# Patient Record
Sex: Male | Born: 1960
Health system: Southern US, Community
[De-identification: ages and names within clinical notes are randomized; demographics above are authoritative.]

## PROBLEM LIST (undated history)

## (undated) DIAGNOSIS — Z951 Presence of aortocoronary bypass graft: Secondary | ICD-10-CM

## (undated) DIAGNOSIS — Z87442 Personal history of urinary calculi: Secondary | ICD-10-CM

## (undated) DIAGNOSIS — L97519 Non-pressure chronic ulcer of other part of right foot with unspecified severity: Secondary | ICD-10-CM

## (undated) DIAGNOSIS — I25110 Atherosclerotic heart disease of native coronary artery with unstable angina pectoris: Secondary | ICD-10-CM

## (undated) DIAGNOSIS — C4491 Basal cell carcinoma of skin, unspecified: Secondary | ICD-10-CM

## (undated) DIAGNOSIS — IMO0002 Reserved for concepts with insufficient information to code with codable children: Secondary | ICD-10-CM

## (undated) DIAGNOSIS — G4733 Obstructive sleep apnea (adult) (pediatric): Secondary | ICD-10-CM

## (undated) DIAGNOSIS — J189 Pneumonia, unspecified organism: Secondary | ICD-10-CM

## (undated) DIAGNOSIS — K219 Gastro-esophageal reflux disease without esophagitis: Secondary | ICD-10-CM

## (undated) DIAGNOSIS — T7840XA Allergy, unspecified, initial encounter: Secondary | ICD-10-CM

## (undated) DIAGNOSIS — I219 Acute myocardial infarction, unspecified: Secondary | ICD-10-CM

## (undated) DIAGNOSIS — E1165 Type 2 diabetes mellitus with hyperglycemia: Secondary | ICD-10-CM

## (undated) DIAGNOSIS — I10 Essential (primary) hypertension: Secondary | ICD-10-CM

## (undated) DIAGNOSIS — G5792 Unspecified mononeuropathy of left lower limb: Secondary | ICD-10-CM

## (undated) DIAGNOSIS — M14672 Charcot's joint, left ankle and foot: Secondary | ICD-10-CM

## (undated) DIAGNOSIS — D649 Anemia, unspecified: Secondary | ICD-10-CM

## (undated) DIAGNOSIS — G4730 Sleep apnea, unspecified: Secondary | ICD-10-CM

## (undated) DIAGNOSIS — E785 Hyperlipidemia, unspecified: Secondary | ICD-10-CM

## (undated) DIAGNOSIS — M869 Osteomyelitis, unspecified: Secondary | ICD-10-CM

## (undated) DIAGNOSIS — E119 Type 2 diabetes mellitus without complications: Secondary | ICD-10-CM

## (undated) DIAGNOSIS — E118 Type 2 diabetes mellitus with unspecified complications: Secondary | ICD-10-CM

## (undated) HISTORY — PX: COLONOSCOPY W/ POLYPECTOMY: SHX1380

## (undated) HISTORY — DX: Essential (primary) hypertension: I10

## (undated) HISTORY — DX: Basal cell carcinoma of skin, unspecified: C44.91

## (undated) HISTORY — DX: Allergy, unspecified, initial encounter: T78.40XA

## (undated) HISTORY — DX: Osteomyelitis, unspecified: M86.9

## (undated) HISTORY — DX: Charcot's joint, left ankle and foot: M14.672

## (undated) HISTORY — DX: Unspecified mononeuropathy of left lower limb: G57.92

## (undated) HISTORY — DX: Obstructive sleep apnea (adult) (pediatric): G47.33

## (undated) HISTORY — PX: OTHER SURGICAL HISTORY: SHX169

## (undated) HISTORY — DX: Type 2 diabetes mellitus without complications: E11.9

---

## 2010-05-09 ENCOUNTER — Encounter
Admission: RE | Admit: 2010-05-09 | Discharge: 2010-05-09 | Payer: Self-pay | Source: Home / Self Care | Attending: Family Medicine | Admitting: Family Medicine

## 2011-07-04 ENCOUNTER — Ambulatory Visit (HOSPITAL_BASED_OUTPATIENT_CLINIC_OR_DEPARTMENT_OTHER): Payer: 59 | Attending: Otolaryngology | Admitting: Radiology

## 2011-07-04 VITALS — Ht 75.0 in | Wt 255.0 lb

## 2011-07-04 DIAGNOSIS — I4949 Other premature depolarization: Secondary | ICD-10-CM | POA: Insufficient documentation

## 2011-07-04 DIAGNOSIS — R259 Unspecified abnormal involuntary movements: Secondary | ICD-10-CM | POA: Insufficient documentation

## 2011-07-04 DIAGNOSIS — G4733 Obstructive sleep apnea (adult) (pediatric): Secondary | ICD-10-CM | POA: Insufficient documentation

## 2011-07-07 DIAGNOSIS — I4949 Other premature depolarization: Secondary | ICD-10-CM

## 2011-07-07 DIAGNOSIS — G4733 Obstructive sleep apnea (adult) (pediatric): Secondary | ICD-10-CM

## 2011-07-07 DIAGNOSIS — R259 Unspecified abnormal involuntary movements: Secondary | ICD-10-CM

## 2011-07-07 NOTE — Procedures (Signed)
NAMEGERONIMO, Joshua Branch              ACCOUNT NO.:  [id number]  MEDICAL RECORD NO.:  [id number]          PATIENT TYPE:  OUT  LOCATION:  SLEEP CENTER                 FACILITY:  [facility]  PHYSICIAN:  Keosha Rossa D. Maple Hudson, MD, FCCP, FACPDATE OF BIRTH:  04/17/63  DATE OF STUDY:  07/04/2011                           NOCTURNAL POLYSOMNOGRAM  REFERRING PHYSICIAN:  Antony Contras, MD  REFERRING PHYSICIAN:  Excell Seltzer. Jenne Pane, MD  INDICATION FOR STUDY:  Hypersomnia with sleep apnea.  EPWORTH SLEEPINESS SCORE:  5/24.  BMI 31.9, weight 255 pounds, height 75 inches, neck 16 inches.  HOME MEDICATIONS:  Charted and reviewed.  SLEEP ARCHITECTURE:  Total sleep time 340.5 minutes with sleep efficiency 88.9%.  Stage I was 5%, stage II 70.9%, stage III 7.2%, REM 16.9% of total sleep time.  Sleep latency 29.5 minutes, REM latency 117 minutes.  Awake after sleep onset 14 minutes.  Arousal index 35.1.  BEDTIME MEDICATION:  None.  RESPIRATORY DATA:  Apnea/hypopnea index (AHI) 12.2 per hour.  A total of 69 events were scored including 5 obstructive apneas, 1 central apnea, 1 mixed apnea, 62 hypopneas.  Events were most common while supine.  REM AHI 13.6 per hour.  There were insufficient numbers of early events to trigger application of split protocol, CPAP titration on this study night.  OXYGEN DATA:  Moderate snoring with oxygen desaturation to a nadir of 84% and a mean oxygen saturation through the study of 92% on room air.  CARDIAC DATA:  Sinus rhythm with occasional PVC.  MOVEMENT/PARASOMNIA:  Periodic limb movement syndrome.  Total of 92 limb jerks were counted, of which 2 were associated with arousals or awakening for periodic limb movement with arousal index of 0.4 per hour. Bathroom x1.  IMPRESSION/RECOMMENDATION: 1. Mild obstructive sleep apnea/hypoxemia syndrome, AHI 12.2 per hour,     mainly associated with supine sleep position and REM.  REM AHI 13.6     per hour.  Moderate snoring with  oxygen desaturation to a nadir of     84% and a mean oxygen saturation through the study of 92% on room     air. 2. There were insufficient numbers of events to qualify for split     protocol CPAP titration on this study night.  Consider return for     dedicated CPAP titration study or evaluate for alternative     management, as clinically appropriate. 3. Frequent limb jerks but with little actual sleep disturbance.  A     total of 92 limb jerks were counted, but only 2 were associated     with arousals or awakening for an index of 0.4 per hour.  Bathroom     x1.     Evadene Wardrip D. Maple Hudson, MD, [facility], FACP Diplomate, Biomedical engineer of Sleep Medicine Electronically Signed    CDY/MEDQ  D:  07/07/2011 10:54:41  T:  07/07/2011 13:08:17  Job:  [id number]

## 2014-05-07 ENCOUNTER — Encounter: Payer: Self-pay | Admitting: Medical

## 2014-05-07 ENCOUNTER — Other Ambulatory Visit: Payer: Self-pay

## 2014-05-07 ENCOUNTER — Ambulatory Visit (INDEPENDENT_AMBULATORY_CARE_PROVIDER_SITE_OTHER): Payer: 59 | Admitting: Medical

## 2014-05-07 VITALS — BP 158/95 | HR 91 | Temp 98.2°F | Ht 75.2 in | Wt 267.2 lb

## 2014-05-07 DIAGNOSIS — G4733 Obstructive sleep apnea (adult) (pediatric): Secondary | ICD-10-CM | POA: Insufficient documentation

## 2014-05-07 DIAGNOSIS — I10 Essential (primary) hypertension: Secondary | ICD-10-CM

## 2014-05-07 MED ORDER — LISINOPRIL 10 MG PO TABS: 10.0000 mg | ORAL_TABLET | Freq: Every day | ORAL | Status: DC

## 2014-05-07 NOTE — Assessment & Plan Note (Signed)
Mild by review of his records and further testing was planned. Pt never followed through. He does not want referral now. He states he wants to try to loose weight first. Will follow his weight loss efforts and bring referral back up in future.

## 2014-05-07 NOTE — Progress Notes (Signed)
Pre visit review using our clinic review tool, if applicable. No additional management support is needed unless otherwise documented below in the visit note. 

## 2014-05-07 NOTE — Assessment & Plan Note (Signed)
Pt bp still high despite hctz 25 mg. Advised eat foods with k. Will add lisinopril since his bp not ideal. Bmp ok at ED. Follow up in 3 wks check his bp and repeat cmp. Make sure his k is still in normal limits.

## 2014-05-07 NOTE — Progress Notes (Signed)
Subjective:    Patient ID: Joshua Branch, male    DOB: Jul 01, 1961, 53 y.o.   MRN: [id number]  HPI    Pt states he was driving to a football game a game and got confused. He got lost and had a HA. No vision changes. No nauseu or vomiting. No slurred speech. Pt bp was 180-190 over 120-130. Pt had ct of his head and was negative. Pt was given medication. Pt was given HCTZ 25 mg q day. Pt states bp was elevated before about 5 years ago. Prior reading were approximate 145/95 but he never took medications. Pt bp readings over past week on average 140/90 with his machine. Highest was 145/95.    Pt states a lot of stess recently. Mom is in alzeihmers unit. Arguments with relatives. Some marital problems last 5 months. Not depressed or real anxious.  Pt states feels ok. No chest pain or neurologic symptoms.  Spring allergies none now.  Allergic to fruits with pit or core.  Pt drinks 3 beers 1-2 times a week with friends.  Reviewed his pmh, psh, and fh.  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 Malachi Bonds a day. Now very rare. Tennis one time a week. Married- 2 boys.  Pt states sleep study for apnea was neg 2 years ago. Pt wife thinks. On review pt informed mild sleep studies in past and further studies recommended.  Past Medical History  Diagnosis Date  . Allergy   . Hypertension   . Obstructive sleep apnea 05/07/2014    History   Social History  . Marital Status: Married    Spouse Name: N/A    Number of Children: N/A  . Years of Education: N/A   Occupational History  . Not on file.   Social History Main Topics  . Smoking status: Never Smoker   . Smokeless tobacco: Never Used  . Alcohol Use: Yes  . Drug Use: Not on file  . Sexual Activity: Not on file   Other Topics Concern  . Not on file   Social History Narrative  . No narrative on file    No past surgical history on file.  Family History  Problem Relation Age of Onset  . Diabetes Mother   . Stroke  Father   . Hypertension Father   . Hearing loss Father     No Known Allergies  No current outpatient prescriptions on file prior to visit.   No current facility-administered medications on file prior to visit.    BP 158/95  Pulse 91  Temp(Src) 98.2 F (36.8 C) (Oral)  Ht 6' 3.2" (1.91 m)  Wt 267 lb 3.2 oz (121.201 kg)  BMI 33.22 kg/m2  SpO2 94%           Review of Systems  Constitutional: Negative for fever, chills and diaphoresis.  HENT: Negative for congestion, ear pain, hearing loss, mouth sores, nosebleeds, rhinorrhea, sinus pressure and sore throat.   Respiratory: Negative for apnea, cough, choking, shortness of breath and wheezing.   Cardiovascular: Negative for chest pain, palpitations and leg swelling.  Gastrointestinal: Negative for nausea, vomiting, abdominal pain, constipation, blood in stool, abdominal distention and rectal pain.  Endocrine: Negative for polydipsia, polyphagia and polyuria.  Genitourinary: Negative.   Musculoskeletal: Negative.   Neurological: Negative for dizziness, tremors, seizures, syncope, facial asymmetry, speech difficulty, weakness, light-headedness, numbness and headaches.  Hematological: Negative for adenopathy. Does not bruise/bleed easily.  Psychiatric/Behavioral:       None of  below reported but stressed and marital problems.       Objective:   Physical Exam  General Mental Status- Alert. General Appearance- Not in acute distress.   Skin General: Color- Normal Color. Moisture- Normal Moisture.  Neck Carotid Arteries- Normal color. Moisture- Normal Moisture. No carotid bruits. No JVD.  Chest and Lung Exam Auscultation: Breath Sounds:-Normal.clear even and unlabored.  Cardiovascular Auscultation:Rythm- Regular, rate and rhythm. Murmurs & Other Heart Sounds:Auscultation of the heart reveals- No Murmurs.  Abdomen Inspection:-Inspeection Normal. Palpation/Percussion:Note:No mass. Palpation and Percussion of the  abdomen reveal- Non Tender, Non Distended + BS, no rebound or guarding.    Neurologic Cranial Nerve exam:- CN III-XII intact(No nystagmus), symmetric smile. Drift Test:- No drift. Finger to Nose:- Normal/Intact Strength:- 5/5 equal and symmetric strength both upper and lower extremities.       Assessment & Plan:

## 2014-05-07 NOTE — Patient Instructions (Addendum)
For your htn, I want to add lisinopril 10 mg every day. Continue with hctz 25 mg every day. Check cmp today and will repeat cmp after 3 weeks to make sure your k is within normal limits.   Recommend dash diet, and mild-moderate exercise.(Start slow first and get in shape gradual)  Try to loose 10-15 pounds. You declined further sleep study now but would recommend that near future.  Follow up in 3 weeks or prn.  By  January I want you to schedule complete physical exam. Note today lab dc'd since he did provide copy of bmp done in ED and k was normal.  DASH Eating Plan DASH stands for "Dietary Approaches to Stop Hypertension." The DASH eating plan is a healthy eating plan that has been shown to reduce high blood pressure (hypertension). Additional health benefits may include reducing the risk of type 2 diabetes mellitus, heart disease, and stroke. The DASH eating plan may also help with weight loss. WHAT DO I NEED TO KNOW ABOUT THE DASH EATING PLAN? For the DASH eating plan, you will follow these general guidelines:  Choose foods with a percent daily value for sodium of less than 5% (as listed on the food label).  Use salt-free seasonings or herbs instead of table salt or sea salt.  Check with your health care provider or pharmacist before using salt substitutes.  Eat lower-sodium products, often labeled as "lower sodium" or "no salt added."  Eat fresh foods.  Eat more vegetables, fruits, and low-fat dairy products.  Choose whole grains. Look for the word "whole" as the first word in the ingredient list.  Choose fish and skinless chicken or [place] more often than red meat. Limit fish, poultry, and meat to 6 oz (170 g) each day.  Limit sweets, desserts, sugars, and sugary drinks.  Choose heart-healthy fats.  Limit cheese to 1 oz (28 g) per day.  Eat more home-cooked food and less restaurant, buffet, and fast food.  Limit fried foods.  Cook foods using methods other than  frying.  Limit canned vegetables. If you do use them, rinse them well to decrease the sodium.  When eating at a restaurant, ask that your food be prepared with less salt, or no salt if possible. WHAT FOODS CAN I EAT? Seek help from a dietitian for individual calorie needs. Grains Whole grain or whole wheat bread. Brown rice. Whole grain or whole wheat pasta. Quinoa, bulgur, and whole grain cereals. Low-sodium cereals. Corn or whole wheat flour tortillas. Whole grain cornbread. Whole grain crackers. Low-sodium crackers. Vegetables Fresh or frozen vegetables (raw, steamed, roasted, or grilled). Low-sodium or reduced-sodium tomato and vegetable juices. Low-sodium or reduced-sodium tomato sauce and paste. Low-sodium or reduced-sodium canned vegetables.  Fruits All fresh, canned (in natural juice), or frozen fruits. Meat and Other Protein Products Ground beef (85% or leaner), grass-fed beef, or beef trimmed of fat. Skinless chicken or [place]. Ground chicken or [place]. Pork trimmed of fat. All fish and seafood. Eggs. Dried beans, peas, or lentils. Unsalted nuts and seeds. Unsalted canned beans. Dairy Low-fat dairy products, such as skim or 1% milk, 2% or reduced-fat cheeses, low-fat ricotta or cottage cheese, or plain low-fat yogurt. Low-sodium or reduced-sodium cheeses. Fats and Oils Tub margarines without trans fats. Light or reduced-fat mayonnaise and salad dressings (reduced sodium). Avocado. Safflower, olive, or canola oils. Natural peanut or almond butter. Other Unsalted popcorn and pretzels. The items listed above may not be a complete list of recommended foods or beverages. Contact your  dietitian for more options. WHAT FOODS ARE NOT RECOMMENDED? Grains White bread. White pasta. White rice. Refined cornbread. Bagels and croissants. Crackers that contain trans fat. Vegetables Creamed or fried vegetables. Vegetables in a cheese sauce. Regular canned vegetables. Regular canned tomato sauce  and paste. Regular tomato and vegetable juices. Fruits Dried fruits. Canned fruit in light or heavy syrup. Fruit juice. Meat and Other Protein Products Fatty cuts of meat. Ribs, chicken wings, bacon, sausage, bologna, salami, chitterlings, fatback, hot dogs, bratwurst, and packaged luncheon meats. Salted nuts and seeds. Canned beans with salt. Dairy Whole or 2% milk, cream, half-and-half, and cream cheese. Whole-fat or sweetened yogurt. Full-fat cheeses or blue cheese. Nondairy creamers and whipped toppings. Processed cheese, cheese spreads, or cheese curds. Condiments Onion and garlic salt, seasoned salt, table salt, and sea salt. Canned and packaged gravies. Worcestershire sauce. Tartar sauce. Barbecue sauce. Teriyaki sauce. Soy sauce, including reduced sodium. Steak sauce. Fish sauce. Oyster sauce. Cocktail sauce. Horseradish. Ketchup and mustard. Meat flavorings and tenderizers. Bouillon cubes. Hot sauce. Tabasco sauce. Marinades. Taco seasonings. Relishes. Fats and Oils Butter, stick margarine, lard, shortening, ghee, and bacon fat. Coconut, palm kernel, or palm oils. Regular salad dressings. Other Pickles and olives. Salted popcorn and pretzels. The items listed above may not be a complete list of foods and beverages to avoid. Contact your dietitian for more information. WHERE CAN I FIND MORE INFORMATION? National Heart, Lung, and Blood Institute: travelstabloid.com Document Released: 07/05/2011 Document Revised: 11/30/2013 Document Reviewed: 05/20/2013 [facility] Patient Information 2015 [place], [place]. This information is not intended to replace advice given to you by your health care provider. Make sure you discuss any questions you have with your health care provider.

## 2014-05-28 ENCOUNTER — Ambulatory Visit: Payer: 59 | Admitting: Medical

## 2014-05-31 ENCOUNTER — Other Ambulatory Visit: Payer: Self-pay

## 2014-05-31 ENCOUNTER — Telehealth: Payer: Self-pay | Admitting: Medical

## 2014-05-31 DIAGNOSIS — I10 Essential (primary) hypertension: Secondary | ICD-10-CM

## 2014-05-31 MED ORDER — LISINOPRIL 10 MG PO TABS: 10.0000 mg | ORAL_TABLET | Freq: Every day | ORAL | Status: DC

## 2014-05-31 NOTE — Telephone Encounter (Signed)
Patient needed to r/s due to Joshua Branch being out of the office 11/3, he took his last lisinopril today and wants a call back from assistant to see if he needs a refill or if it is ok to wait until Wednesday to continue taking

## 2014-05-31 NOTE — Telephone Encounter (Signed)
Refilled medication for patient. Patient notified.

## 2014-06-01 ENCOUNTER — Ambulatory Visit: Payer: 59 | Admitting: Medical

## 2014-06-02 ENCOUNTER — Telehealth: Payer: Self-pay | Admitting: Medical

## 2014-06-02 ENCOUNTER — Ambulatory Visit (INDEPENDENT_AMBULATORY_CARE_PROVIDER_SITE_OTHER): Payer: 59 | Admitting: Medical

## 2014-06-02 ENCOUNTER — Encounter: Payer: Self-pay | Admitting: Medical

## 2014-06-02 VITALS — BP 143/93 | HR 90 | Temp 98.4°F | Ht 75.2 in | Wt 273.5 lb

## 2014-06-02 DIAGNOSIS — H6990 Unspecified Eustachian tube disorder, unspecified ear: Secondary | ICD-10-CM | POA: Insufficient documentation

## 2014-06-02 DIAGNOSIS — R252 Cramp and spasm: Secondary | ICD-10-CM | POA: Insufficient documentation

## 2014-06-02 DIAGNOSIS — H6980 Other specified disorders of Eustachian tube, unspecified ear: Secondary | ICD-10-CM | POA: Insufficient documentation

## 2014-06-02 DIAGNOSIS — H6982 Other specified disorders of Eustachian tube, left ear: Secondary | ICD-10-CM

## 2014-06-02 DIAGNOSIS — I10 Essential (primary) hypertension: Secondary | ICD-10-CM

## 2014-06-02 DIAGNOSIS — R739 Hyperglycemia, unspecified: Secondary | ICD-10-CM

## 2014-06-02 LAB — COMPREHENSIVE METABOLIC PANEL
ALBUMIN: 3.4 g/dL — AB (ref 3.5–5.2)
ALK PHOS: 67 U/L (ref 39–117)
ALT: 27 U/L (ref 0–53)
AST: 16 U/L (ref 0–37)
BUN: 27 mg/dL — AB (ref 6–23)
CO2: 25 meq/L (ref 19–32)
Calcium: 9.1 mg/dL (ref 8.4–10.5)
Chloride: 105 mEq/L (ref 96–112)
Creatinine, Ser: 1.1 mg/dL (ref 0.4–1.5)
GFR: 75.55 mL/min (ref >60.00)
GLUCOSE: 198 mg/dL — AB (ref 70–99)
POTASSIUM: 4.1 meq/L (ref 3.5–5.1)
SODIUM: 137 meq/L (ref 135–145)
TOTAL PROTEIN: 6.9 g/dL (ref 6.0–8.3)
Total Bilirubin: 0.6 mg/dL (ref 0.2–1.2)

## 2014-06-02 LAB — MAGNESIUM: Magnesium: 2 mg/dL (ref 1.5–2.5)

## 2014-06-02 MED ORDER — LISINOPRIL-HYDROCHLOROTHIAZIDE 10-12.5 MG PO TABS: 1.0000 | ORAL_TABLET | Freq: Every day | ORAL | Status: DC

## 2014-06-02 NOTE — Patient Instructions (Signed)
For your bp which is most of the time very well controlled will rx lisinopril/hctz 10/12.5 mg. We will check your electrolyte levels due to mild cramping.  For your probable eustachian tube dysfunction which likely related to allergies, I want you to continue to use nasocort and zyrtec. When we know your bp is under good control(in 3-4 days) with the above med  then you could use very low dose otc sudafed but only 2 days. Check your bp to monitor effect of sudafed on your bp levels.  Follow up in 1 month for fasting wellness exam early am.

## 2014-06-02 NOTE — Progress Notes (Signed)
Pre visit review using our clinic review tool, if applicable. No additional management support is needed unless otherwise documented below in the visit note. 

## 2014-06-02 NOTE — Assessment & Plan Note (Signed)
Check cmp today and mg level. Make sure electrolytes are in line. See if may need k supplementation.

## 2014-06-02 NOTE — Assessment & Plan Note (Signed)
Pt blood pressures are mostly controlled with systolic in 943 range and diastolic controlled as well. So I think will try to give combo 10/12.5 Lisinopril/hctz. This may help with his cramping of hands. Will Check his k today. Note previosly was on hctz 25 mg.

## 2014-06-02 NOTE — Assessment & Plan Note (Signed)
For your probable eustachian tube dysfunction which likely related to allergies, I want you to continue to use nasocort and zyrtec. When we know your bp is under good control(in 3-4 days) with the above bp med  then you could use very low dose otc sudafed but only 2 days. Check your bp to monitor effect of sudafed on your bp levels.

## 2014-06-02 NOTE — Progress Notes (Signed)
Subjective:    Patient ID: Joshua Branch, male    DOB: [birth year], 53 y.o.   MRN: [id number]  HPI   Pt in for follow up. Pt states his bp range 140/90 in the am when he first wakes. But most 115/75 range. Little higher at time  but not lower. Pt feels fine. No side effects that are obvious. Pt only states he cramps after trimming a lot of branches but he states that he has done that in the past.   Also after playing tennis he noticed a little bit of cramping but he states he has done that in the past. No chest pain or neuro signs or symptoms.  States hx of intermittent lt ear pressure. This has been going on for years. In the spring time it usually severe. Pt states nasocort helps a little. Spring is worse. He states has seen various ENT in the past but none found solution.He has tried afrin before but did not help.  . Past Medical History  Diagnosis Date  . Allergy   . Hypertension   . Obstructive sleep apnea 05/07/2014    History   Social History  . Marital Status: Married    Spouse Name: N/A    Number of Children: N/A  . Years of Education: N/A   Occupational History  . Not on file.   Social History Main Topics  . Smoking status: Never Smoker   . Smokeless tobacco: Never Used  . Alcohol Use: Yes  . Drug Use: Not on file  . Sexual Activity: Not on file   Other Topics Concern  . Not on file   Social History Narrative    No past surgical history on file.  Family History  Problem Relation Age of Onset  . Diabetes Mother   . Stroke Father   . Hypertension Father   . Hearing loss Father     No Known Allergies  Current Outpatient Prescriptions on File Prior to Visit  Medication Sig Dispense Refill  . hydrochlorothiazide (HYDRODIURIL) 25 MG tablet Take 25 mg by mouth daily.    Marland Kitchen lisinopril (PRINIVIL,ZESTRIL) 10 MG tablet Take 1 tablet (10 mg total) by mouth daily. 30 tablet 1   No current facility-administered medications on file prior to visit.    BP  143/93 mmHg  Pulse 90  Temp(Src) 98.4 F (36.9 C) (Oral)  Ht 6' 3.2" (1.91 m)  Wt 273 lb 8 oz (124.059 kg)  BMI 34.01 kg/m2  SpO2 95%      Review of Systems  Constitutional: Negative for fever, chills and fatigue.  HENT: Positive for ear pain. Negative for congestion, rhinorrhea, sinus pressure, sore throat and trouble swallowing.        Lt ear slight pressure.  Respiratory: Negative for cough, shortness of breath and wheezing.   Cardiovascular: Negative for chest pain and palpitations.  Gastrointestinal: Negative.   Genitourinary: Negative.   Musculoskeletal: Negative.   Skin: Negative.   Hematological: Negative.  Negative for adenopathy. Does not bruise/bleed easily.  Psychiatric/Behavioral: Negative.           Objective:   Physical Exam   General Mental Status- Alert. General Appearance- Not in acute distress.   HEENT-  No sinus pressure. Mild boggy turbinates. No pnd. Canals clear and tm both normal.  Skin General: Color- Normal Color. Moisture- Normal Moisture.  Neck Carotid Arteries- Normal color. Moisture- Normal Moisture. No carotid bruits. No JVD.  Chest and Lung Exam Auscultation: Breath Sounds:- CTA  Cardiovascular Auscultation:Rythm-  Regular., Rate and Rhythm Murmurs & Other Heart Sounds:Auscultation of the heart reveals- No Murmurs.    Neurologic Cranial Nerve exam:- CN III-XII intact(No nystagmus), symmetric smile. Romberg Exam:- Negative.            Assessment & Plan:

## 2014-06-02 NOTE — Telephone Encounter (Signed)
Elevated bs on cmp. 

## 2014-06-02 NOTE — Telephone Encounter (Signed)
Originally opened up and started documenting on Mr.Ted chart. This was wrong pt chart. So that note and wrote this explanation.

## 2014-07-07 ENCOUNTER — Telehealth: Payer: Self-pay | Admitting: Medical

## 2014-07-07 NOTE — Telephone Encounter (Signed)
Pt message states bp medication is clogging up his ears. Unusual side effect. I thought he had eustachian tube dysfunction. He need office visit. Needs re-evaluation and may nee ENT referral.

## 2014-07-07 NOTE — Telephone Encounter (Signed)
Caller name: Daviel Relation to pt: self Call back number: [phone] Pharmacy:  Reason for call:   Patient states that the bp medication is still clogging up his ears and would like something else.

## 2014-07-08 NOTE — Telephone Encounter (Signed)
Discussed situation with Percell Miller, he stated that patient still needs to come in for OV.  Pt made aware.  He said he would call back to schedule an appointment.

## 2014-07-08 NOTE — Telephone Encounter (Signed)
Pt states that he's certain the side effect is due to the lisinopril-HCTZ.  States when on the medication his ears clog up and whenever he's off the medication the clogging of the ears stop.  Decongestions and antihistamines due help.  At this time, the patient would rather not come in for an office visit.  Instead, he would like his blood pressure medication changed to something else.    Please advise.

## 2014-07-21 ENCOUNTER — Ambulatory Visit (INDEPENDENT_AMBULATORY_CARE_PROVIDER_SITE_OTHER): Payer: 59 | Admitting: Medical

## 2014-07-21 ENCOUNTER — Encounter: Payer: Self-pay | Admitting: Medical

## 2014-07-21 VITALS — BP 128/80 | HR 90 | Temp 97.8°F | Ht 75.2 in | Wt 276.4 lb

## 2014-07-21 DIAGNOSIS — H6992 Unspecified Eustachian tube disorder, left ear: Secondary | ICD-10-CM

## 2014-07-21 DIAGNOSIS — H6982 Other specified disorders of Eustachian tube, left ear: Secondary | ICD-10-CM

## 2014-07-21 DIAGNOSIS — I10 Essential (primary) hypertension: Secondary | ICD-10-CM

## 2014-07-21 DIAGNOSIS — R739 Hyperglycemia, unspecified: Secondary | ICD-10-CM

## 2014-07-21 MED ORDER — AMLODIPINE BESYLATE 10 MG PO TABS: 10.0000 mg | ORAL_TABLET | Freq: Every day | ORAL | Status: DC

## 2014-07-21 NOTE — Assessment & Plan Note (Addendum)
Your blood pressure is well controlled today but you report definite correlation with you lt  ear pressure recently with use of lisinopril/hctz.  You stopped and your ear symptoms resolved. So I am switching you to amlodipine.  This is unusual but pt does want me to switch the bp med.  Please call us in 2 wks and give [place] bp readings

## 2014-07-21 NOTE — Progress Notes (Signed)
Pre visit review using our clinic review tool, if applicable. No additional management support is needed unless otherwise documented below in the visit note. 

## 2014-07-21 NOTE — Patient Instructions (Addendum)
Your blood pressure is well controlled today but you report definite correlation with you lt  ear pressure recently with use of lisinopril/hctz.  You stopped and your ear symptoms resolved. So I am switching you to amlodipine.  Please call us in 2 wks and give [place] bp readings and update [place] on your ear pressure. If ear pressure persists would either consider ent referral or imaging studies.  Combination of afrin only 2 days and nasal steroid thereafter may help.  Follow up 2 wks or as needed.  I did review your labs today and your bs was elevated so I do want you to get a1-c today.

## 2014-07-21 NOTE — Progress Notes (Signed)
Subjective:    Patient ID: Joshua Branch, male    DOB: 1961-03-20, 53 y.o.   MRN: [id number]  HPI   Pt in states he has plugged up ear sensation. This is chronic and recurrent lt ear for years. Pt has seen various ENT over the years but never really helped much.  His symptoms always in the spring.   Pt never gets this plugged sensation in the fall. And so he attributed it to the lisinopril. He stopped the lisinopril for 3 days and he noticed the plugged sensation went away.   In the past when gets ear pressure during the spring he will use decongestant and zyrtec. And symptoms would resolve.  Pt not a smoker.  Pt bp reading readings on lisinopril and he states late in the day is his best reading. First reading in the am is usually the worse. But most of the day he states his bp are controlled.  Pt baseline had decreased hearing in his left ear  when he was young  Out of all pt specialist that he saw he never had any imaging studies that he remembers.   Past Medical History  Diagnosis Date  . Allergy   . Hypertension   . Obstructive sleep apnea 05/07/2014    History   Social History  . Marital Status: Married    Spouse Name: N/A    Number of Children: N/A  . Years of Education: N/A   Occupational History  . Not on file.   Social History Main Topics  . Smoking status: Never Smoker   . Smokeless tobacco: Never Used  . Alcohol Use: Yes  . Drug Use: Not on file  . Sexual Activity: Not on file   Other Topics Concern  . Not on file   Social History Narrative    No past surgical history on file.  Family History  Problem Relation Age of Onset  . Diabetes Mother   . Stroke Father   . Hypertension Father   . Hearing loss Father     No Known Allergies  Current Outpatient Prescriptions on File Prior to Visit  Medication Sig Dispense Refill  . lisinopril-hydrochlorothiazide (PRINZIDE,ZESTORETIC) 10-12.5 MG per tablet Take 1 tablet by mouth daily. 30 tablet 3   No  current facility-administered medications on file prior to visit.    BP 128/80 mmHg  Pulse 90  Temp(Src) 97.8 F (36.6 C) (Oral)  Ht 6' 3.2" (1.91 m)  Wt 276 lb 6.4 oz (125.374 kg)  BMI 34.37 kg/m2  SpO2 94%       Review of Systems  Constitutional: Negative for fever, chills and fatigue.  HENT: Negative.        Lt ear pressure which is chronic and recurrent.  Respiratory: Negative for cough, choking, chest tightness, shortness of breath and wheezing.   Cardiovascular: Negative for chest pain and palpitations.  Neurological: Negative for dizziness, weakness, light-headedness and headaches.  Hematological: Negative for adenopathy.  Psychiatric/Behavioral: Negative for confusion, dysphoric mood and agitation. The patient is not nervous/anxious.        Objective:   Physical Exam   General Mental Status- Alert. General Appearance- Not in acute distress.   Skin General: Color- Normal Color. Moisture- Normal Moisture.  Neck Carotid Arteries- Normal color. Moisture- Normal Moisture.Marland Kitchen No JVD.  Chest and Lung Exam Auscultation: Breath Sounds:-Normal. CTA.  Cardiovascular Auscultation:Rythm- Regular, Rate and rhythm. Murmurs & Other Heart Sounds:Auscultation of the heart reveals- No Murmurs.    Neurologic Cranial Nerve exam:-  CN III-XII intact(No nystagmus), symmetric smile. Negative romberg.   HEENT Head- Normal. Ear Auditory Canal - Left- Normal. Right - Normal.Tympanic Membrane- Left- Normal minimal faint(tiny) central pinkish/red streak.. Right- Normal. Eye Sclera/Conjunctiva- Left- Normal. Right- Normal. Nose & Sinuses Nasal Mucosa- Left-  Not boggy or Congested. Right-  Not  boggy or Congested.  Bilateral- No enlargement. Discharge- bilateral-None.             Assessment & Plan:

## 2014-07-21 NOTE — Assessment & Plan Note (Signed)
Combination of afrin only 2 days and nasal steroid thereafter may help.   If ear pressure persists would either consider ent referral or imaging studies.  Pt expresses major resistance to referral back to ENT. Counseled with him on imaging studies but he expressed did not want to do that either.

## 2014-07-21 NOTE — Assessment & Plan Note (Signed)
I wanted pt to get a1-c today. He states he did not want that done today. I explained the importance of getting to determine if he has diabetes. He still declines.

## 2014-10-05 ENCOUNTER — Telehealth: Payer: Self-pay | Admitting: Medical

## 2014-10-05 NOTE — Telephone Encounter (Signed)
Caller name: Antwane, Grose Relation to pt: self  Call back number: [phone]   Reason for call:  Pt requesting an alternate of lisinopril-hydrochlorothiazide (PRINZIDE,ZESTORETIC) 10-12.5 MG per table. Please advise

## 2014-10-05 NOTE — Telephone Encounter (Signed)
Per pt ENT thinks he should stop lisinopril causing ear symptoms. I actually switched him to amlodipine per avs at his request. So I get LPN to notify pt regarding last plan per avs and notify him.

## 2014-10-06 MED ORDER — HYDROCHLOROTHIAZIDE 25 MG PO TABS: 25.0000 mg | ORAL_TABLET | Freq: Every day | ORAL | Status: DC

## 2014-10-06 NOTE — Telephone Encounter (Signed)
Pt states his ear felt worse with amlodipine and it gave him a ha. Message I got was ENT thought lisinopril was causing him ear symptoms. Rx hctz 25 mg at his request. I advised him make sure eating k rich foods while on. Will follow up in 2 wks and check his bp.

## 2015-01-13 ENCOUNTER — Other Ambulatory Visit: Payer: Self-pay | Admitting: Medical

## 2015-01-20 NOTE — Telephone Encounter (Signed)
Patient states he will check his schedule and call back to make appointment for follow up visit.

## 2015-02-03 ENCOUNTER — Ambulatory Visit (INDEPENDENT_AMBULATORY_CARE_PROVIDER_SITE_OTHER): Payer: 59 | Admitting: Sports Medicine

## 2015-02-03 ENCOUNTER — Encounter: Payer: Self-pay | Admitting: Sports Medicine

## 2015-02-03 VITALS — BP 146/96 | HR 89 | Ht 74.0 in | Wt 270.0 lb

## 2015-02-03 DIAGNOSIS — M14672 Charcot's joint, left ankle and foot: Secondary | ICD-10-CM

## 2015-02-03 DIAGNOSIS — M2170 Unequal limb length (acquired), unspecified site: Secondary | ICD-10-CM | POA: Diagnosis not present

## 2015-02-03 DIAGNOSIS — G5792 Unspecified mononeuropathy of left lower limb: Secondary | ICD-10-CM | POA: Diagnosis not present

## 2015-02-03 HISTORY — DX: Unspecified mononeuropathy of left lower limb: G57.92

## 2015-02-03 HISTORY — DX: Charcot's joint, left ankle and foot: M14.672

## 2015-02-03 NOTE — Assessment & Plan Note (Signed)
Etiology unclear to me but suspect lumbar origin

## 2015-02-03 NOTE — Progress Notes (Signed)
  Joshua Branch - 54 y.o. male MRN [id number]  Date of birth: 25-Aug-1960  SUBJECTIVE:  Including CC & ROS.   Patient presents for evaluation of left foot swelling. He says that the swelling started about 1 year ago, and has gotten to the point where it will never go away completely. He is active and plays tennis, where the swelling tends to get worse. He denies any pain to the area, but also says that he has a 20+ year history of numbness in the foot (he is uncertain why, but has had nerve conduction testing that showed the nerves were damaged... He has a history of low back trauma/pain but doesn't state much in the way of long radiculopathy history). He has only tried using a bandage to wrap the foot. He was evaluated by his orthopedist Dr. Berenice Primas and had x-rays but did not bring in a disc today. He works in an office as a Chief Executive Officer.  Dr Berenice Primas felt he should seek conservative care and support for what sounds like midfoot arthritis.   HISTORY: Past Medical, Surgical, Social, and Family History Reviewed & Updated per EMR. Pertinent Historical Findings include: Neuropathy of left foot Prediabetes  PHYSICAL EXAM:  VS: BP:(!) 146/96 mmHg  HR:89bpm  TEMP: ( )  RESP:   HT:6\' 2"  (188 cm)   WT:270 lb (122.471 kg)  BMI:34.7 PHYSICAL EXAM:  General: NAD Neuro: alert and oriented x 3. MSK: Left foot: INSPECTION: narrow left (and right) foot for stature. obvious deformity/swelling noted in medial left midfoot in area of lisfranc joint. Short 1st MT compared to 2nd.  PALPATION: palpable bony anatomy (1st metatarsal) on bottom of foot not aligned with lisfranc joint/ large MTT bossint at MTT 1,2  And less on MTT 3 RANGE OF MOTION: no limitation in motion of toes. Slightly limited ankle dorsiflexion. STRENGTH: intact SPECIAL TESTS: Upon standing there is complete loss of longitudinal arch NEUROVASCULAR STATUS: diminished sensation. 2+ DP pulse  Leg length discrepancy noted (not measured) right  shorter than left  Ultrasound: Marked arthritic breakdown of MTT 1/2 and 3 with effusion and calcifications;  Calcification and loose body along medial malleolus.  Effusions along medial MTT joints.  No effusion at ankle.  Midfoot spurring noted.  ASSESSMENT & PLAN: See problem based charting & AVS for pt instructions.

## 2015-02-03 NOTE — Assessment & Plan Note (Addendum)
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 At this point primary goal is to limit further worsening of the joint changes. Sport insole with long arch support pad today. Schedule back for custom orthotics. Continue his compression sleeve for midfoot, ankle brace (air cast). It would be nice to be able to view the x-rays taken of his foot so encouraged him to get a copy of the disc for next visit.  Addendum  We added sports insoles and continued the leg length correction  For better support of mid foot scaphoid padding added  Comfortable after preparation/  Test these prior to custom orthotic

## 2015-02-03 NOTE — Assessment & Plan Note (Signed)
We will add a lift to the RT orthotic on preparation

## 2015-02-24 ENCOUNTER — Encounter: Payer: Self-pay | Admitting: Medical

## 2015-02-24 ENCOUNTER — Other Ambulatory Visit: Payer: Self-pay

## 2015-02-24 ENCOUNTER — Ambulatory Visit (INDEPENDENT_AMBULATORY_CARE_PROVIDER_SITE_OTHER): Payer: 59 | Admitting: Medical

## 2015-02-24 VITALS — BP 154/92 | HR 91 | Temp 98.5°F | Ht 74.0 in | Wt 282.6 lb

## 2015-02-24 DIAGNOSIS — R739 Hyperglycemia, unspecified: Secondary | ICD-10-CM | POA: Diagnosis not present

## 2015-02-24 DIAGNOSIS — I10 Essential (primary) hypertension: Secondary | ICD-10-CM | POA: Diagnosis not present

## 2015-02-24 DIAGNOSIS — H60392 Other infective otitis externa, left ear: Secondary | ICD-10-CM

## 2015-02-24 DIAGNOSIS — Z1211 Encounter for screening for malignant neoplasm of colon: Secondary | ICD-10-CM | POA: Diagnosis not present

## 2015-02-24 MED ORDER — LISINOPRIL-HYDROCHLOROTHIAZIDE 10-12.5 MG PO TABS: 1.0000 | ORAL_TABLET | Freq: Every day | ORAL | Status: DC

## 2015-02-24 MED ORDER — NEOMYCIN-POLYMYXIN-HC 3.5-10000-1 OP SUSP: 3.0000 [drp] | Freq: Four times a day (QID) | OPHTHALMIC | Status: DC

## 2015-02-24 MED ORDER — NEOMYCIN-POLYMYXIN-HC 3.5-10000-1 OT SOLN: 3.0000 [drp] | Freq: Four times a day (QID) | OTIC | Status: DC

## 2015-02-24 NOTE — Progress Notes (Signed)
Pre visit review using our clinic review tool, if applicable. No additional management support is needed unless otherwise documented below in the visit note. 

## 2015-02-24 NOTE — Patient Instructions (Addendum)
htn- Well controlled while on med. Out of medication for 3 days. Refill zestoretic.  Get fasting labs within a couple of weeks.    Follow up in 3 weeks or as needed.   Asked pt on day he is in for labs to also get nurse bp check.  Possible otitis externa by pt history. Will rx trial of cortipsorin otic.

## 2015-02-24 NOTE — Progress Notes (Signed)
   Subjective:    Patient ID: Joshua Branch, male    DOB: Dec 29, 1960, 54 y.o.   MRN: [id number]  HPI  Pt ran out of htn meds  four  days ago. Early in am about 140/90. Later in the day when checks bp goes down. Sometimes late in day 115/70. No cardiac or neurologic signs or symptoms.  No side effects reported taking zesotoretic   Pt recently swam and left ear feels like got plugged up. He is convinced swimmers ear. Prior intermittent mild ear plugged sensation. In past ENT offered tubes. Pt declined. Pt now remembers correlation in past with swimming and plugged sensation.   Review of Systems  Constitutional: Negative for fever, chills, diaphoresis, activity change and fatigue.  HENT:       Ear plugged sensation left side mild.  Respiratory: Negative for cough, chest tightness and shortness of breath.   Cardiovascular: Negative for chest pain, palpitations and leg swelling.  Gastrointestinal: Negative for nausea, vomiting and abdominal pain.  Musculoskeletal: Negative for neck pain and neck stiffness.  Neurological: Negative for dizziness, syncope, weakness, light-headedness, numbness and headaches.  Psychiatric/Behavioral: Negative for behavioral problems, confusion and agitation. The patient is not nervous/anxious.    Past Medical History  Diagnosis Date  . Allergy   . Hypertension   . Obstructive sleep apnea 05/07/2014  . Neuropathy of left foot 02/03/2015  . Charcot's joint of left foot, non-diabetic 02/03/2015    History   Social History  . Marital Status: Married    Spouse Name: N/A  . Number of Children: N/A  . Years of Education: N/A   Occupational History  . Not on file.   Social History Main Topics  . Smoking status: Never Smoker   . Smokeless tobacco: Never Used  . Alcohol Use: Yes  . Drug Use: Not on file  . Sexual Activity: Not on file   Other Topics Concern  . Not on file   Social History Narrative    No past surgical history on file.  Family  History  Problem Relation Age of Onset  . Diabetes Mother   . Stroke Father   . Hypertension Father   . Hearing loss Father     No Known Allergies  Current Outpatient Prescriptions on File Prior to Visit  Medication Sig Dispense Refill  . aspirin 81 MG chewable tablet Chew 81 mg by mouth.     No current facility-administered medications on file prior to visit.    BP 154/92 mmHg  Pulse 91  Temp(Src) 98.5 F (36.9 C) (Oral)  Ht 6\' 2"  (1.88 m)  Wt 282 lb 9.6 oz (128.187 kg)  BMI 36.27 kg/m2  SpO2 95%      Objective:   Physical Exam  General Mental Status- Alert. General Appearance- Not in acute distress.   Skin General: Color- Normal Color. Moisture- Normal Moisture.  Ears- normal canals, normal tm. No tragal tenderness.  Neck Carotid Arteries- Normal color. Moisture- Normal Moisture. No carotid bruits. No JVD.  Chest and Lung Exam Auscultation: Breath Sounds:-Normal.  Cardiovascular Auscultation:Rythm- Regular. Murmurs & Other Heart Sounds:Auscultation of the heart reveals- No Murmurs.    Neurologic Cranial Nerve exam:- CN III-XII intact(No nystagmus), symmetric smile. Strength:- 5/5 equal and symmetric strength both upper and lower extremities.      Assessment & Plan:

## 2015-03-22 ENCOUNTER — Encounter: Payer: Self-pay | Admitting: Sports Medicine

## 2015-03-22 ENCOUNTER — Ambulatory Visit (INDEPENDENT_AMBULATORY_CARE_PROVIDER_SITE_OTHER): Payer: 59 | Admitting: Sports Medicine

## 2015-03-22 VITALS — BP 135/87 | Ht 74.5 in | Wt 270.0 lb

## 2015-03-22 DIAGNOSIS — M2170 Unequal limb length (acquired), unspecified site: Secondary | ICD-10-CM | POA: Diagnosis not present

## 2015-03-22 DIAGNOSIS — M14672 Charcot's joint, left ankle and foot: Secondary | ICD-10-CM | POA: Diagnosis not present

## 2015-03-22 NOTE — Assessment & Plan Note (Addendum)
He reported improvement with the green insoles.  - custom orthotics today for his tennis shoes  - padding placed in dress shoes and he wears a narrow shoe. May need to order special narrow orthotic if needed for dress shoes  - he will f/u PRN if needing adjustments to his orthotics.   Patient was fitted for a standard, cushioned, semi-rigid orthotic. The orthotic was heated and afterward the patient stood on the orthotic blank positioned on the orthotic stand. The patient was positioned in subtalar neutral position and 10 degrees of ankle dorsiflexion in a weight bearing stance. After completion of molding, a stable base was applied to the orthotic blank. The blank was ground to a stable position for weight bearing. Size: 14 Red EVA Base: Blue EVA Additional Posting and Padding: Heel lift placed in right orthotic The patient ambulated these, and they were very comfortable.  I spent 40 minutes with this patient, greater than 50% was face-to-face time counseling regarding the below diagnosis.

## 2015-03-22 NOTE — Assessment & Plan Note (Signed)
We added a felt 3/4 length correction to orthotics  Also added to felt inserts for dress shoes with low profile

## 2015-03-22 NOTE — Progress Notes (Signed)
  Joshua Branch - 54 y.o. male MRN [id number]  Date of birth: May 22, 1961   Joshua Branch is a 54 y.o. male who presents today for follow up of his charcot foot.   Previously seen on 02/02/2105 for foot swelling. He was found to have a leg length discrepancy and ultrasound revealed a marked arthritic breakdown of MTT 1/2 and 3 with effusion and calcifications.  Diagnosed with Charcot's joint of left foot and placed in green insoles with mid foot scaphoid padding.   Since then he has had improvement in his left foot. There is no popping or cracking when he plays tennis. His symptoms first started about a year ago and seemed to have stabilized.  He denies any injury in the interim. There is no pain but does have continued loss of sensation of his left foot.      PMHx -  reviewed.  Contributory factors include: Prediabetes, Neuropathy of left foot  PSHx - reviewed.   FHx - reviewed.   Medications -  ASA  ROS Per HPI   Exam:  Filed Vitals:   03/22/15 1613  BP: 135/87   Gen: NAD Cardiorespiratory - Normal respiratory effort/rate.   Foot Exam:  Laterality: left Appearance: obvious deformity of midfoot dorsal aspect  Gait: orthotics correct his trendelenburg gait  Edema: mild effusion around ankle  Tenderness: none  Range of Motion: some loss of dorsiflexion otherwise normal  Neurovascularly intact: loss of sensation in left compared to right, +2 pulses  Arch: loss of logitudinal arch upon standing Leg length discrepancy: 95 cm left leg, 93 cm right leg  Strength:  Dorsiflexion: 5/5 Plantarflexion: 5/5 Inversion: 5/5 Eversion: 5/5   Imaging:  02/03/15 Left foot [place]: Ultrasound: Marked arthritic breakdown of MTT 1/2 and 3 with effusion and calcifications; Calcification and loose body along medial malleolus. Effusions along medial MTT joints. No effusion at ankle. Midfoot spurring noted.

## 2015-03-28 ENCOUNTER — Encounter: Payer: Self-pay | Admitting: Internal Medicine

## 2015-06-08 ENCOUNTER — Telehealth: Payer: Self-pay | Admitting: Medical

## 2015-06-08 DIAGNOSIS — R739 Hyperglycemia, unspecified: Secondary | ICD-10-CM

## 2015-06-08 NOTE — Telephone Encounter (Signed)
Pt will call back to schedule an appointment.

## 2015-06-08 NOTE — Telephone Encounter (Signed)
Pt had order to get a1-c due to hyperglycemia. Will you call her and remind her to get that done. Put in order if old order expired. But she could go ahead also and schedule bp check appointment that day as well. Last seen in July.

## 2015-08-30 ENCOUNTER — Telehealth: Payer: Self-pay | Admitting: Medical

## 2015-08-30 DIAGNOSIS — R319 Hematuria, unspecified: Secondary | ICD-10-CM | POA: Diagnosis not present

## 2015-08-30 DIAGNOSIS — R1030 Lower abdominal pain, unspecified: Secondary | ICD-10-CM | POA: Diagnosis not present

## 2015-08-30 DIAGNOSIS — N201 Calculus of ureter: Secondary | ICD-10-CM | POA: Diagnosis not present

## 2015-08-30 NOTE — Telephone Encounter (Signed)
Pt called in to schedule an appt with PCP for blood in urine. Informed pt that provider is gone the day and offered to schedule pt with a different provider, pt says that he has a buddy that he will call.

## 2015-10-19 ENCOUNTER — Other Ambulatory Visit: Payer: Self-pay | Admitting: Medical

## 2015-10-24 MED FILL — LISINOPRIL-HCTZ 10-12.5 MG: 10-12.5 | 30 days supply | Qty: 30 | Fill #0

## 2015-10-24 NOTE — Telephone Encounter (Signed)
Spoke with pt and he will call back for an appointment. Pt was advised to schedule an appointment before the medication runs out. Pt will call back to schedule an appointment after he looks at his calender.

## 2015-10-24 NOTE — Telephone Encounter (Signed)
Pt called in to check the status of his refill request. Pt is requesting a follow up call when refill is complete and at pharmacy.    CB: [phone]

## 2015-10-24 NOTE — Telephone Encounter (Signed)
So he needs to be aware he needs to follow up for further refills. Approaching a year since last seen.

## 2015-10-25 NOTE — Telephone Encounter (Signed)
Called. Spoke with pt. Informed him that an appt is definitely needed. Pt says that at this time of the month he is extremely busy. He will call in to schedule FU. Informed pt that going forward refills will not be granted until he is seen. Pt expressed understanding and says that he will definitely call back to schedule.     Thanks.

## 2015-10-26 MED FILL — PATADAY 0.2% EYE DROPS: 0.2 | 25 days supply | Qty: 3 | Fill #3

## 2015-11-30 ENCOUNTER — Ambulatory Visit (INDEPENDENT_AMBULATORY_CARE_PROVIDER_SITE_OTHER): Payer: 59 | Admitting: Medical

## 2015-11-30 ENCOUNTER — Telehealth: Payer: Self-pay | Admitting: Medical

## 2015-11-30 ENCOUNTER — Encounter: Payer: Self-pay | Admitting: Medical

## 2015-11-30 VITALS — BP 149/90 | HR 77 | Temp 98.0°F | Ht 74.0 in | Wt 276.2 lb

## 2015-11-30 DIAGNOSIS — Z1211 Encounter for screening for malignant neoplasm of colon: Secondary | ICD-10-CM

## 2015-11-30 DIAGNOSIS — I10 Essential (primary) hypertension: Secondary | ICD-10-CM | POA: Diagnosis not present

## 2015-11-30 DIAGNOSIS — R739 Hyperglycemia, unspecified: Secondary | ICD-10-CM

## 2015-11-30 DIAGNOSIS — Z125 Encounter for screening for malignant neoplasm of prostate: Secondary | ICD-10-CM | POA: Diagnosis not present

## 2015-11-30 LAB — COMPREHENSIVE METABOLIC PANEL
ALT: 39 U/L (ref 0–53)
AST: 21 U/L (ref 0–37)
Albumin: 4.3 g/dL (ref 3.5–5.2)
Alkaline Phosphatase: 82 U/L (ref 39–117)
BILIRUBIN TOTAL: 0.5 mg/dL (ref 0.2–1.2)
BUN: 18 mg/dL (ref 6–23)
CO2: 26 meq/L (ref 19–32)
CREATININE: 0.97 mg/dL (ref 0.40–1.50)
Calcium: 9.6 mg/dL (ref 8.4–10.5)
Chloride: 102 mEq/L (ref 96–112)
GFR: 85.94 mL/min (ref >60.00)
GLUCOSE: 272 mg/dL — AB (ref 70–99)
Potassium: 4.6 mEq/L (ref 3.5–5.1)
SODIUM: 135 meq/L (ref 135–145)
Total Protein: 6.8 g/dL (ref 6.0–8.3)

## 2015-11-30 LAB — HEMOGLOBIN A1C: Hgb A1c MFr Bld: 9.2 % — ABNORMAL HIGH (ref 4.6–6.5)

## 2015-11-30 LAB — PSA: PSA: 0.98 ng/mL (ref 0.10–4.00)

## 2015-11-30 MED ORDER — FLUTICASONE PROPIONATE 50 MCG/ACT NA SUSP: 2.0000 | Freq: Every day | NASAL | Status: DC

## 2015-11-30 MED ORDER — CETIRIZINE HCL 10 MG PO TABS: 10.0000 mg | ORAL_TABLET | Freq: Every day | ORAL | Status: DC

## 2015-11-30 MED ORDER — LISINOPRIL-HYDROCHLOROTHIAZIDE 10-12.5 MG PO TABS: 1.0000 | ORAL_TABLET | Freq: Every day | ORAL | Status: DC

## 2015-11-30 MED FILL — FLUTICASONE PROP 50 MCG SPR: 50 | 30 days supply | Qty: 16 | Fill #0

## 2015-11-30 MED FILL — LISINOPRIL-HCTZ 10-12.5 MG: 10-12.5 | 90 days supply | Qty: 90 | Fill #0

## 2015-11-30 MED FILL — PATADAY 0.2% EYE DROPS: 0.2 | 25 days supply | Qty: 3 | Fill #4

## 2015-11-30 MED FILL — ALL DAY ALLERGY 10 MG TAB: 10 | 100 days supply | Qty: 100 | Fill #0

## 2015-11-30 NOTE — Progress Notes (Signed)
Pre visit review using our clinic review tool, if applicable. No additional management support is needed unless otherwise documented below in the visit note. 

## 2015-11-30 NOTE — Progress Notes (Signed)
Subjective:    Patient ID: Joshua Branch, male    DOB: 08-20-1960, 55 y.o.   MRN: [id number]  HPI   Pt in for evaluation. Pt states he has not taken his bp med in 4 days.  Pt when he checks his bp in early am 130/90 range. Rest of the day his bp is much better. Late in evening after exercise bp is less than 120/80.   Pt admits poor diet. Admits eating out a lot and  eating a lot of sweets.  Pt has not had coloscopy yet thought order was placed.  Pt declines hiv screen.    Review of Systems  Constitutional: Negative for fever, chills, diaphoresis, activity change and fatigue.  Respiratory: Negative for cough, choking, chest tightness, shortness of breath and wheezing.   Cardiovascular: Negative for chest pain, palpitations and leg swelling.  Gastrointestinal: Negative for nausea, vomiting and abdominal pain.  Musculoskeletal: Negative for neck pain and neck stiffness.  Skin: Negative for rash.  Neurological: Negative for dizziness, seizures, syncope, facial asymmetry, weakness and headaches.  Psychiatric/Behavioral: Negative for behavioral problems, confusion and agitation. The patient is not nervous/anxious.     Past Medical History  Diagnosis Date  . Allergy   . Hypertension   . Obstructive sleep apnea 05/07/2014  . Neuropathy of left foot 02/03/2015  . Charcot's joint of left foot, non-diabetic 02/03/2015     Social History   Social History  . Marital Status: Married    Spouse Name: N/A  . Number of Children: N/A  . Years of Education: N/A   Occupational History  . Not on file.   Social History Main Topics  . Smoking status: Never Smoker   . Smokeless tobacco: Never Used  . Alcohol Use: Yes  . Drug Use: Not on file  . Sexual Activity: Not on file   Other Topics Concern  . Not on file   Social History Narrative    No past surgical history on file.  Family History  Problem Relation Age of Onset  . Diabetes Mother   . Stroke Father   . Hypertension  Father   . Hearing loss Father     No Known Allergies  Current Outpatient Prescriptions on File Prior to Visit  Medication Sig Dispense Refill  . lisinopril-hydrochlorothiazide (PRINZIDE,ZESTORETIC) 10-12.5 MG tablet TAKE 1 TABLET BY MOUTH DAILY. 30 tablet 0   No current facility-administered medications on file prior to visit.    BP 149/90 mmHg  Pulse 77  Temp(Src) 98 F (36.7 C) (Oral)  Ht 6\' 2"  (1.88 m)  Wt 276 lb 3.2 oz (125.283 kg)  BMI 35.45 kg/m2  SpO2 97%       Objective:   Physical Exam  General Mental Status- Alert. General Appearance- Not in acute distress.   Skin General: Color- Normal Color. Moisture- Normal Moisture.  Neck Carotid Arteries- Normal color. Moisture- Normal Moisture. No carotid bruits. No JVD.  Chest and Lung Exam Auscultation: Breath Sounds:-Normal.  Cardiovascular Auscultation:Rythm- Regular. Murmurs & Other Heart Sounds:Auscultation of the heart reveals- No Murmurs.  Abdomen Inspection:-Inspection Normal. Palpation/Percussion:Note:No mass. Palpation and Percussion of the abdomen reveal- Non Tender, Non Distended + BS, no rebound or guarding.  Neurologic Cranial Nerve exam:- CN III-XII intact(No nystagmus), symmetric smile. Strength:- 5/5 equal and symmetric strength both upper and lower extremities.      Assessment & Plan:  For htn refilled your bp medication today. Will get cmp today. Also want you to come in fasting in future to  get lipid panel.  For your hx of  High blood sugar will get a1c today.  Will re-refer to GI.  Will get psa today.  Follow up 3-6 months depending on lab review.

## 2015-11-30 NOTE — Patient Instructions (Addendum)
For htn refilled your bp medication today. Will get cmp today. Also want you to come in fasting in future to get lipid panel.  For your hx of  High blood sugar will get a1c today.  Will re-refer to GI.  Will get psa today.  Follow up 3-6 months depending on lab review.

## 2015-11-30 NOTE — Telephone Encounter (Signed)
Pt declined hiv testing. Please abstract that.

## 2015-12-01 NOTE — Telephone Encounter (Signed)
Documented in chart 12/01/15.

## 2016-01-02 ENCOUNTER — Encounter: Payer: Self-pay | Admitting: Medical

## 2016-01-02 ENCOUNTER — Telehealth: Payer: Self-pay | Admitting: Medical

## 2016-01-02 NOTE — Telephone Encounter (Signed)
Gi office has been trying to contact pt. Will you try to contact him and coordinate with GI.

## 2016-01-02 NOTE — Telephone Encounter (Signed)
Contact patient and he is going to call [facility]

## 2016-01-26 ENCOUNTER — Encounter: Payer: Self-pay | Admitting: Medical

## 2016-02-06 DIAGNOSIS — IMO0001 Reserved for inherently not codable concepts without codable children: Secondary | ICD-10-CM | POA: Insufficient documentation

## 2016-02-06 DIAGNOSIS — H9042 Sensorineural hearing loss, unilateral, left ear, with unrestricted hearing on the contralateral side: Secondary | ICD-10-CM | POA: Insufficient documentation

## 2016-02-06 DIAGNOSIS — H6982 Other specified disorders of Eustachian tube, left ear: Secondary | ICD-10-CM | POA: Diagnosis not present

## 2016-02-28 DIAGNOSIS — F4323 Adjustment disorder with mixed anxiety and depressed mood: Secondary | ICD-10-CM | POA: Diagnosis not present

## 2016-03-05 MED FILL — LISINOPRIL-HCTZ 10-12.5 MG: 10-12.5 | 90 days supply | Qty: 90 | Fill #1

## 2016-03-06 DIAGNOSIS — H5213 Myopia, bilateral: Secondary | ICD-10-CM | POA: Diagnosis not present

## 2016-03-06 DIAGNOSIS — H35033 Hypertensive retinopathy, bilateral: Secondary | ICD-10-CM | POA: Diagnosis not present

## 2016-04-04 DIAGNOSIS — F4323 Adjustment disorder with mixed anxiety and depressed mood: Secondary | ICD-10-CM | POA: Diagnosis not present

## 2016-05-08 DIAGNOSIS — F4323 Adjustment disorder with mixed anxiety and depressed mood: Secondary | ICD-10-CM | POA: Diagnosis not present

## 2016-05-31 DIAGNOSIS — F4323 Adjustment disorder with mixed anxiety and depressed mood: Secondary | ICD-10-CM | POA: Diagnosis not present

## 2016-06-11 ENCOUNTER — Ambulatory Visit (INDEPENDENT_AMBULATORY_CARE_PROVIDER_SITE_OTHER): Payer: 59 | Admitting: Family Medicine

## 2016-06-11 ENCOUNTER — Encounter: Payer: Self-pay | Admitting: Family Medicine

## 2016-06-11 ENCOUNTER — Other Ambulatory Visit: Payer: Self-pay

## 2016-06-11 VITALS — BP 144/86 | HR 95 | Temp 98.1°F | Ht 75.0 in | Wt 272.4 lb

## 2016-06-11 DIAGNOSIS — J029 Acute pharyngitis, unspecified: Secondary | ICD-10-CM | POA: Diagnosis not present

## 2016-06-11 DIAGNOSIS — Z23 Encounter for immunization: Secondary | ICD-10-CM | POA: Diagnosis not present

## 2016-06-11 LAB — POCT RAPID STREP A (OFFICE): RAPID STREP A SCREEN: NEGATIVE

## 2016-06-11 MED ORDER — LISINOPRIL-HYDROCHLOROTHIAZIDE 10-12.5 MG PO TABS: 1.0000 | ORAL_TABLET | Freq: Every day | ORAL | 1 refills | Status: DC

## 2016-06-11 MED FILL — LISINOPRIL-HCTZ 10-12.5 MG: 10-12.5 | 90 days supply | Qty: 90 | Fill #0

## 2016-06-11 NOTE — Patient Instructions (Signed)
Ibuprofen 600 mg three times daily for pain.

## 2016-06-11 NOTE — Addendum Note (Signed)
Addended by: Vernie Shanks E on: 06/11/2016 11:46 AM   Modules accepted: Orders

## 2016-06-11 NOTE — Progress Notes (Addendum)
SUBJECTIVE:   Joshua Branch is a 55 y.o. male presents to the clinic for:  Chief Complaint  Patient presents with  . Sore Throat    Since Saturday    Complains of sore throat for 2 days.  Other associated symptoms: sinus congestion, rhinorrhea, ear pain and DRY COUGH.  Denies: ear drainage and fever Sick Contacts: none known Therapy to date: NSAID, tylenol  History  Smoking Status  . Never Smoker  Smokeless Tobacco  . Never Used    ROS: Pertinent items are noted in HPI  Patient's medications, allergies, past medical, surgical, social and family histories were reviewed and updated as appropriate.  OBJECTIVE:  BP (!) 144/86   Pulse 95   Temp 98.1 F (36.7 C) (Oral)   Ht 6\' 3"  (1.905 m)   Wt 272 lb 6.4 oz (123.6 kg)   SpO2 95%   BMI 34.05 kg/m  General: Awake, alert, appearing stated age Eyes: conjunctivae and sclerae clear Ears: normal TMs bilaterally Nose: no visible exudate Oropharynx: lips, mucosa, and tongue normal; teeth and gums normal, pharynx mildly erythematous, no exudate Neck: supple, no significant adenopathy, no tenderness Lungs: clear to auscultation, no wheezes, rales or rhonchi, symmetric air entry, normal effort Heart: rate and rhythm regular Skin:reveals no rash Psych: Age appropriate judgment and insight  ASSESSMENT/PLAN:  Sore throat - Plan: POCT rapid strep A  Orders as above. Strep neg. Ibuprofen for pain. Wash hands, rest, fluids. BP elevated, [place] and forgot to bring his meds. Will check BP at home and schedule appt if consistently above 140/90. F/u prn. Pt voiced understanding and agreement to the plan.  [place], DO 06/11/16 11:38 AM

## 2016-06-11 NOTE — Progress Notes (Signed)
Pre visit review using our clinic tool,if applicable. No additional management support is needed unless otherwise documented below in the visit note.  

## 2016-06-14 ENCOUNTER — Telehealth: Payer: Self-pay | Admitting: Medical

## 2016-06-14 NOTE — Telephone Encounter (Signed)
Pt called in. He states that he was seen for a sore throat. Pt says that he is still not feeling better. Pt would like to be advised further. He says that he is now coughing up green mucus. Pt would like to have a Rx for something if possible.     Please assist further.    CB: [phone]   Pharmacy: [facility], [place] [address]

## 2016-06-14 NOTE — Telephone Encounter (Signed)
I have spoken to patient and informed him that Dr. Nani Ravens would sent in Rx for Tessalon for cough patient stated he would not want to use this to help relieve symptoms. TL/CMA

## 2016-06-15 MED FILL — AMOX-CLAV 875-125 MG TABLET: 875-125 | 10 days supply | Qty: 20 | Fill #0

## 2016-07-06 DIAGNOSIS — F4323 Adjustment disorder with mixed anxiety and depressed mood: Secondary | ICD-10-CM | POA: Diagnosis not present

## 2016-07-20 DIAGNOSIS — H6982 Other specified disorders of Eustachian tube, left ear: Secondary | ICD-10-CM | POA: Diagnosis not present

## 2016-07-20 MED FILL — AMOXICILLIN 500 MG CAPSULE: 500 | 7 days supply | Qty: 21 | Fill #0

## 2016-07-20 MED FILL — CHLORHEXIDINE 0.12% RINSE: 0.12 | 16 days supply | Qty: 473 | Fill #0

## 2016-07-31 ENCOUNTER — Encounter: Payer: Self-pay | Admitting: Medical

## 2016-08-02 MED ORDER — SILDENAFIL CITRATE 20 MG PO TABS: ORAL_TABLET | ORAL | 0 refills | Status: DC

## 2016-08-02 MED FILL — SILDENAFIL 20 MG TABLET: 20 | 10 days supply | Qty: 50 | Fill #0

## 2016-08-20 DIAGNOSIS — H938X2 Other specified disorders of left ear: Secondary | ICD-10-CM | POA: Insufficient documentation

## 2016-08-20 DIAGNOSIS — R42 Dizziness and giddiness: Secondary | ICD-10-CM | POA: Insufficient documentation

## 2016-08-20 DIAGNOSIS — Z9622 Myringotomy tube(s) status: Secondary | ICD-10-CM | POA: Insufficient documentation

## 2016-08-20 DIAGNOSIS — H9042 Sensorineural hearing loss, unilateral, left ear, with unrestricted hearing on the contralateral side: Secondary | ICD-10-CM | POA: Diagnosis not present

## 2016-08-29 MED FILL — HYDROCHLOROTHIAZIDE 12.5 MG: 12.5 | 90 days supply | Qty: 90 | Fill #0

## 2016-08-29 NOTE — Telephone Encounter (Signed)
I talked with pt ENT. He wants to increase hctz to 25 mg total a day for menieres. Pt bp has not been well controlled. Please have him schedule appointment within a month. Early am or early afternoon appointment. Want to check his k level and see how bp is with higher dose medication. Please advise pt.

## 2016-08-30 NOTE — Telephone Encounter (Signed)
Message sent to patient via MyChart. 

## 2016-09-19 DIAGNOSIS — H9042 Sensorineural hearing loss, unilateral, left ear, with unrestricted hearing on the contralateral side: Secondary | ICD-10-CM | POA: Diagnosis not present

## 2016-09-19 DIAGNOSIS — Z9622 Myringotomy tube(s) status: Secondary | ICD-10-CM | POA: Diagnosis not present

## 2016-09-19 DIAGNOSIS — H8102 Meniere's disease, left ear: Secondary | ICD-10-CM | POA: Insufficient documentation

## 2016-09-24 MED FILL — LISINOPRIL-HCTZ 10-12.5 MG: 10-12.5 | 90 days supply | Qty: 90 | Fill #1

## 2016-11-26 ENCOUNTER — Other Ambulatory Visit: Payer: Self-pay | Admitting: Medical

## 2016-11-30 ENCOUNTER — Other Ambulatory Visit: Payer: Self-pay | Admitting: Family Medicine

## 2016-11-30 MED FILL — HYDROCHLOROTHIAZIDE 12.5 MG: 12.5 | 90 days supply | Qty: 90 | Fill #1

## 2016-11-30 MED FILL — OLOPATADINE HCL 0.2 % SOLN: 0.2 | 30 days supply | Qty: 3 | Fill #0

## 2016-11-30 NOTE — Telephone Encounter (Signed)
Rx sent to the pharmacy by e-script.  However only given #90.  The patient needs further evaluation and/or laboratory testing before further refills are given.  Ask patient to make an appointment for this.//AB/CMA

## 2016-12-05 ENCOUNTER — Other Ambulatory Visit: Payer: Self-pay | Admitting: Medical

## 2016-12-05 MED FILL — LISINOPRIL-HCTZ 10-12.5 MG: 10-12.5 | 90 days supply | Qty: 90 | Fill #0

## 2016-12-07 ENCOUNTER — Ambulatory Visit (INDEPENDENT_AMBULATORY_CARE_PROVIDER_SITE_OTHER): Payer: 59 | Admitting: Medical

## 2016-12-07 ENCOUNTER — Encounter: Payer: Self-pay | Admitting: Medical

## 2016-12-07 VITALS — BP 130/84 | HR 88 | Temp 98.0°F | Resp 16 | Ht 75.0 in | Wt 265.4 lb

## 2016-12-07 DIAGNOSIS — R739 Hyperglycemia, unspecified: Secondary | ICD-10-CM

## 2016-12-07 DIAGNOSIS — E118 Type 2 diabetes mellitus with unspecified complications: Secondary | ICD-10-CM | POA: Diagnosis not present

## 2016-12-07 DIAGNOSIS — L989 Disorder of the skin and subcutaneous tissue, unspecified: Secondary | ICD-10-CM

## 2016-12-07 DIAGNOSIS — I10 Essential (primary) hypertension: Secondary | ICD-10-CM

## 2016-12-07 LAB — COMPREHENSIVE METABOLIC PANEL
ALT: 48 U/L — ABNORMAL HIGH (ref 9–46)
AST: 29 U/L (ref 10–35)
Albumin: 4.1 g/dL (ref 3.6–5.1)
Alkaline Phosphatase: 80 U/L (ref 40–115)
BUN: 19 mg/dL (ref 7–25)
CHLORIDE: 98 mmol/L (ref 98–110)
CO2: 27 mmol/L (ref 20–31)
Calcium: 9.3 mg/dL (ref 8.6–10.3)
Creat: 1.08 mg/dL (ref 0.70–1.33)
GLUCOSE: 312 mg/dL — AB (ref 65–99)
POTASSIUM: 4 mmol/L (ref 3.5–5.3)
Sodium: 135 mmol/L (ref 135–146)
Total Bilirubin: 0.5 mg/dL (ref 0.2–1.2)
Total Protein: 6.7 g/dL (ref 6.1–8.1)

## 2016-12-07 MED ORDER — AMOXICILLIN-POT CLAVULANATE 875-125 MG PO TABS: 1.0000 | ORAL_TABLET | Freq: Two times a day (BID) | ORAL | 0 refills | Status: DC

## 2016-12-07 MED ORDER — SILDENAFIL CITRATE 20 MG PO TABS: ORAL_TABLET | ORAL | 0 refills | Status: DC

## 2016-12-07 MED FILL — AMOX-CLAV 875-125 MG TABLET: 875-125 | 10 days supply | Qty: 20 | Fill #0

## 2016-12-07 MED FILL — SILDENAFIL 20 MG TABLET: 20 | 20 days supply | Qty: 100 | Fill #0

## 2016-12-07 NOTE — Progress Notes (Signed)
Subjective:    Patient ID: Joshua Branch, male    DOB: 07/11/1961, 56 y.o.   MRN: [id number]  HPI  Pt in for follow up.  Pt sugars have been high in the past. Average sugar was about 220 one year ago.  Pt has been playing tennis twice a week, swimming and lifting weights some.  Pt states mostly has low salt diet. But not super strict low sugar diet.  Pt bp is good today. When he check bp highest in morning before he takes med. But most of time in this range. His bp lower than 140/90 all the time.  Mild tender transient rt jaw pain yesterday. No teeth pain. No ear pain and no tmj pain.    Review of Systems  Constitutional: Negative for chills, fatigue and fever.  HENT: Negative for congestion, ear discharge and ear pain.   Respiratory: Negative for cough, choking, chest tightness, shortness of breath and wheezing.   Cardiovascular: Negative for chest pain and palpitations.  Gastrointestinal: Negative for abdominal pain.  Genitourinary: Negative for decreased urine volume, discharge, dysuria, flank pain, frequency, hematuria, penile pain, scrotal swelling and testicular pain.  Musculoskeletal: Negative for back pain.  Neurological: Negative for dizziness, seizures, syncope, weakness, light-headedness and headaches.  Hematological: Negative for adenopathy. Does not bruise/bleed easily.  Psychiatric/Behavioral: Negative for behavioral problems and confusion.    Past Medical History:  Diagnosis Date  . Allergy   . Charcot's joint of left foot, non-diabetic 02/03/2015  . Hypertension   . Neuropathy of left foot 02/03/2015  . Obstructive sleep apnea 05/07/2014     Social History   Social History  . Marital status: Married    Spouse name: N/A  . Number of children: N/A  . Years of education: N/A   Occupational History  . Not on file.   Social History Main Topics  . Smoking status: Never Smoker  . Smokeless tobacco: Never Used  . Alcohol use Yes  . Drug use: Unknown  .  Sexual activity: Not on file   Other Topics Concern  . Not on file   Social History Narrative  . No narrative on file    No past surgical history on file.  Family History  Problem Relation Age of Onset  . Diabetes Mother   . Stroke Father   . Hypertension Father   . Hearing loss Father     No Known Allergies  Current Outpatient Prescriptions on File Prior to Visit  Medication Sig Dispense Refill  . cetirizine (ZYRTEC) 10 MG tablet Take 1 tablet (10 mg total) by mouth daily. 30 tablet 11  . fluticasone (FLONASE) 50 MCG/ACT nasal spray Place 2 sprays into both nostrils daily. 16 g 3  . lisinopril-hydrochlorothiazide (PRINZIDE,ZESTORETIC) 10-12.5 MG tablet TAKE 1 TABLET BY MOUTH DAILY. 90 tablet 0   No current facility-administered medications on file prior to visit.     BP 130/84 (BP Location: Right Arm, Patient Position: Sitting, Cuff Size: Large)   Pulse 88   Temp 98 F (36.7 C) (Oral)   Resp 16   Ht 6\' 3"  (1.905 m)   Wt 265 lb 6.4 oz (120.4 kg)   SpO2 93%   BMI 33.17 kg/m       Objective:   Physical Exam  . General Mental Status- Alert. General Appearance- Not in acute distress.   Skin Scattered moles all over. Some small- medium. Rare larger size. Some seborrheic keratosis present.  Neck Carotid Arteries- Normal color. Moisture- Normal Moisture.  No carotid bruits. No JVD.  Chest and Lung Exam Auscultation: Breath Sounds:-Normal.  Cardiovascular Auscultation:Rythm- Regular. Murmurs & Other Heart Sounds:Auscultation of the heart reveals- No Murmurs.  Neurologic Cranial Nerve exam:- CN III-XII intact(No nystagmus), symmetric smile. Strength:- 5/5 equal and symmetric strength both upper and lower extremities.  HEENT Head- Normal. Ear Auditory Canal - Left- Normal. Right - Normal.Tympanic Membrane- Left- Normal. Right- Normal. Eye Sclera/Conjunctiva- Left- Normal. Right- Normal. Nose & Sinuses Nasal Mucosa- Left- Not  Boggy and Congested. Right-    Not Boggy and  Congested.Bilateral  No maxillary and no  frontal sinus pressure. Mouth & Throat Lips: Upper Lip- Normal: no dryness, cracking, pallor, cyanosis, or vesicular eruption. Lower Lip-Normal: no dryness, cracking, pallor, cyanosis or vesicular eruption. Buccal Mucosa- Bilateral- No Aphthous ulcers. Oropharynx- No Discharge or Erythema. Tonsils: Characteristics- Bilateral- No Erythema or Congestion. Size/Enlargement- Bilateral- No enlargement. Discharge- bilateral-None.  Rt jaw- no parotid swelling. No jaw tenderness presently. Inside of mouth teeth appear normal and not tender.  Rt tmj area no tender to palpaiton.        Assessment & Plan:  For high blood pressure continue current regimen.(zesoretic)  For high blood sugars/diabetes get a1c today. Then decide on management.  Will refer to dermatologist to evaluate your skin lesions.   Regarding your transient rt jaw pain yesterday will rx augmentin to cover differential dx as we discussed.(but if you do take please my chart me to let me know your exact symptom.)  Follow up date or as needed  Malissia Rabbani, Percell Miller, [place]

## 2016-12-07 NOTE — Patient Instructions (Addendum)
For high blood pressure continue current regimen.(zesoretic)  For high blood sugars/diabetes get a1c today. Then decide on management.  Will refer to dermatologist to evaluate your skin lesions.   Regarding your transient rt jaw pain yesterday will rx augmentin to cover differential dx as we discussed.(but if you do take please my chart me to let me know your exact symptom.)  Follow up date or as needed

## 2016-12-08 LAB — HEMOGLOBIN A1C
HEMOGLOBIN A1C: 10.8 % — AB (ref <5.7)
Mean Plasma Glucose: 263 mg/dL

## 2016-12-08 MED ORDER — METFORMIN HCL ER 500 MG PO TB24: 500.0000 mg | ORAL_TABLET | Freq: Every day | ORAL | 3 refills | Status: DC

## 2016-12-08 NOTE — Telephone Encounter (Signed)
Rx metformin sent to the pharmacy.

## 2017-01-08 MED FILL — CHLORHEXIDINE 0.12% RINSE: 0.12 | 16 days supply | Qty: 473 | Fill #0

## 2017-02-06 ENCOUNTER — Telehealth: Payer: 59 | Admitting: Family

## 2017-02-06 DIAGNOSIS — L255 Unspecified contact dermatitis due to plants, except food: Secondary | ICD-10-CM

## 2017-02-06 MED ORDER — PREDNISONE 10 MG (21) PO TBPK: ORAL_TABLET | ORAL | 0 refills | Status: DC

## 2017-02-06 NOTE — Progress Notes (Signed)

## 2017-02-07 MED FILL — predniSONE 10 MG TABS: 10 | 6 days supply | Qty: 21 | Fill #0

## 2017-03-07 MED FILL — HYDROCHLOROTHIAZIDE 12.5 MG: 12.5 | 90 days supply | Qty: 90 | Fill #0

## 2017-03-12 ENCOUNTER — Other Ambulatory Visit: Payer: Self-pay | Admitting: Medical

## 2017-03-12 NOTE — Telephone Encounter (Signed)
Patient was very rude when I asked him to confirm he's date of birth he responded "what do I want" I informed patient of message below and he said he will call back to schedule appointment

## 2017-03-12 NOTE — Telephone Encounter (Signed)
Pt is due for follow up please call and schedule appointment.  

## 2017-03-26 MED FILL — LISINOPRIL-HCTZ 10-12.5 MG: 10-12.5 | 90 days supply | Qty: 90 | Fill #0

## 2017-05-22 MED FILL — AMOXICILLIN 875 MG TABLET: 875 | 10 days supply | Qty: 20 | Fill #0

## 2017-05-22 MED FILL — CHLORHEXIDINE 0.12% RINSE: 0.12 | 15 days supply | Qty: 473 | Fill #0

## 2017-06-04 MED FILL — AMOXICILLIN 875 MG TABLET: 875 | 10 days supply | Qty: 20 | Fill #1

## 2017-06-07 ENCOUNTER — Other Ambulatory Visit: Payer: Self-pay | Admitting: Medical

## 2017-06-11 MED FILL — SILDENAFIL 20 MG TABLET: 20 | 20 days supply | Qty: 100 | Fill #0

## 2017-06-11 NOTE — Telephone Encounter (Signed)
Pt is due for follow up please call and schedule appointment.  

## 2017-06-11 NOTE — Telephone Encounter (Signed)
Called, spoke with pt made him aware that an apt is needed. Pt says that he will call back to schedule once he review his schedule

## 2017-06-27 MED FILL — HYDROCHLOROTHIAZIDE 12.5 MG: 12.5 | 30 days supply | Qty: 30 | Fill #0

## 2017-07-09 ENCOUNTER — Other Ambulatory Visit: Payer: Self-pay | Admitting: Medical

## 2017-07-11 MED FILL — LISINOPRIL-HCTZ 10-12.5 MG: 10-12.5 | 90 days supply | Qty: 90 | Fill #0

## 2017-08-02 MED FILL — HYDROCHLOROTHIAZIDE 12.5 MG: 12.5 | 90 days supply | Qty: 90 | Fill #1

## 2017-08-16 MED FILL — AMOX-CLAV 875-125 MG TABLET: 875-125 | 10 days supply | Qty: 20 | Fill #0

## 2017-08-16 MED FILL — CHLORHEXIDINE 0.12% RINSE: 0.12 | 16 days supply | Qty: 473 | Fill #0

## 2017-09-10 DIAGNOSIS — M7022 Olecranon bursitis, left elbow: Secondary | ICD-10-CM | POA: Diagnosis not present

## 2017-09-10 MED FILL — CEPHALEXIN 500 MG CAPSULE: 500 | 7 days supply | Qty: 21 | Fill #0

## 2017-10-10 ENCOUNTER — Other Ambulatory Visit: Payer: Self-pay | Admitting: Medical

## 2017-10-10 MED FILL — LISINOPRIL-HCTZ 10-12.5 MG: 10-12.5 | 30 days supply | Qty: 30 | Fill #0

## 2017-11-08 MED FILL — OLOPATADINE HCL 0.2 % SOLN: 0.2 | 25 days supply | Qty: 3 | Fill #0

## 2017-11-25 MED FILL — HYDROCHLOROTHIAZIDE 12.5 MG: 12.5 | 60 days supply | Qty: 60 | Fill #2

## 2017-12-03 ENCOUNTER — Encounter: Payer: Self-pay | Admitting: Medical

## 2017-12-03 MED ORDER — LISINOPRIL-HYDROCHLOROTHIAZIDE 10-12.5 MG PO TABS: 1.0000 | ORAL_TABLET | Freq: Every day | ORAL | 0 refills | Status: DC

## 2017-12-03 MED FILL — LISINOPRIL-HCTZ 10-12.5 MG: 10-12.5 | 90 days supply | Qty: 90 | Fill #0

## 2017-12-17 ENCOUNTER — Other Ambulatory Visit: Payer: Self-pay | Admitting: Medical

## 2017-12-17 MED FILL — OLOPATADINE HCL 0.2% EYE DR: 0.2 | 25 days supply | Qty: 3 | Fill #1

## 2017-12-17 MED FILL — SILDENAFIL 20 MG TABLET: 20 | 20 days supply | Qty: 100 | Fill #0

## 2018-01-13 ENCOUNTER — Encounter: Payer: Self-pay | Admitting: Medical

## 2018-01-14 MED FILL — HYDROCHLOROTHIAZIDE 12.5 MG: 12.5 | 30 days supply | Qty: 30 | Fill #0

## 2018-01-17 ENCOUNTER — Encounter: Payer: Self-pay | Admitting: Medical

## 2018-01-17 ENCOUNTER — Ambulatory Visit (INDEPENDENT_AMBULATORY_CARE_PROVIDER_SITE_OTHER): Payer: 59 | Admitting: Medical

## 2018-01-17 ENCOUNTER — Telehealth: Payer: Self-pay | Admitting: Medical

## 2018-01-17 VITALS — BP 129/86 | HR 86 | Temp 98.6°F | Resp 16 | Ht 75.0 in | Wt 255.4 lb

## 2018-01-17 DIAGNOSIS — E1165 Type 2 diabetes mellitus with hyperglycemia: Secondary | ICD-10-CM | POA: Diagnosis not present

## 2018-01-17 DIAGNOSIS — Z1211 Encounter for screening for malignant neoplasm of colon: Secondary | ICD-10-CM | POA: Diagnosis not present

## 2018-01-17 DIAGNOSIS — Z113 Encounter for screening for infections with a predominantly sexual mode of transmission: Secondary | ICD-10-CM | POA: Diagnosis not present

## 2018-01-17 DIAGNOSIS — Z0000 Encounter for general adult medical examination without abnormal findings: Secondary | ICD-10-CM | POA: Diagnosis not present

## 2018-01-17 DIAGNOSIS — Z1283 Encounter for screening for malignant neoplasm of skin: Secondary | ICD-10-CM

## 2018-01-17 DIAGNOSIS — Z125 Encounter for screening for malignant neoplasm of prostate: Secondary | ICD-10-CM

## 2018-01-17 DIAGNOSIS — Z23 Encounter for immunization: Secondary | ICD-10-CM | POA: Diagnosis not present

## 2018-01-17 LAB — POC URINALSYSI DIPSTICK (AUTOMATED)
Bilirubin, UA: NEGATIVE
Blood, UA: NEGATIVE
Glucose, UA: POSITIVE — AB
Ketones, UA: NEGATIVE
LEUKOCYTES UA: NEGATIVE
NITRITE UA: NEGATIVE
PROTEIN UA: NEGATIVE
Spec Grav, UA: 1.025 (ref 1.010–1.025)
UROBILINOGEN UA: 1 U/dL
pH, UA: 5.5 (ref 5.0–8.0)

## 2018-01-17 LAB — LIPID PANEL
Cholesterol: 215 mg/dL — ABNORMAL HIGH (ref 0–200)
HDL: 39.6 mg/dL (ref >39.00)
NONHDL: 175.38
Total CHOL/HDL Ratio: 5
Triglycerides: 374 mg/dL — ABNORMAL HIGH (ref 0.0–149.0)
VLDL: 74.8 mg/dL — ABNORMAL HIGH (ref 0.0–40.0)

## 2018-01-17 LAB — CBC WITH DIFFERENTIAL/PLATELET
BASOS ABS: 0.1 10*3/uL (ref 0.0–0.1)
Basophils Relative: 0.9 % (ref 0.0–3.0)
EOS ABS: 0.1 10*3/uL (ref 0.0–0.7)
Eosinophils Relative: 1.4 % (ref 0.0–5.0)
HCT: 50.4 % (ref 39.0–52.0)
Hemoglobin: 17.7 g/dL — ABNORMAL HIGH (ref 13.0–17.0)
LYMPHS ABS: 2.1 10*3/uL (ref 0.7–4.0)
Lymphocytes Relative: 27.3 % (ref 12.0–46.0)
MCHC: 35.1 g/dL (ref 30.0–36.0)
MCV: 91.3 fl (ref 78.0–100.0)
MONOS PCT: 8.9 % (ref 3.0–12.0)
Monocytes Absolute: 0.7 10*3/uL (ref 0.1–1.0)
NEUTROS PCT: 61.5 % (ref 43.0–77.0)
Neutro Abs: 4.7 10*3/uL (ref 1.4–7.7)
Platelets: 229 10*3/uL (ref 150.0–400.0)
RBC: 5.52 Mil/uL (ref 4.22–5.81)
RDW: 12.4 % (ref 11.5–15.5)
WBC: 7.6 10*3/uL (ref 4.0–10.5)

## 2018-01-17 LAB — COMPREHENSIVE METABOLIC PANEL
ALK PHOS: 86 U/L (ref 39–117)
ALT: 48 U/L (ref 0–53)
AST: 24 U/L (ref 0–37)
Albumin: 4.4 g/dL (ref 3.5–5.2)
BILIRUBIN TOTAL: 0.8 mg/dL (ref 0.2–1.2)
BUN: 20 mg/dL (ref 6–23)
CO2: 26 meq/L (ref 19–32)
CREATININE: 1.01 mg/dL (ref 0.40–1.50)
Calcium: 9.5 mg/dL (ref 8.4–10.5)
Chloride: 97 mEq/L (ref 96–112)
GFR: 81.38 mL/min (ref >60.00)
GLUCOSE: 352 mg/dL — AB (ref 70–99)
Potassium: 4.1 mEq/L (ref 3.5–5.1)
Sodium: 135 mEq/L (ref 135–145)
TOTAL PROTEIN: 7 g/dL (ref 6.0–8.3)

## 2018-01-17 LAB — LDL CHOLESTEROL, DIRECT: Direct LDL: 93 mg/dL

## 2018-01-17 LAB — PSA: PSA: 1.05 ng/mL (ref 0.10–4.00)

## 2018-01-17 LAB — HEMOGLOBIN A1C: HEMOGLOBIN A1C: 11.7 % — AB (ref 4.6–6.5)

## 2018-01-17 MED ORDER — METFORMIN HCL ER 500 MG PO TB24: 500.0000 mg | ORAL_TABLET | Freq: Every day | ORAL | 0 refills | Status: DC

## 2018-01-17 MED FILL — METFORMIN HCL ER 500 MG TAB: 500 | 30 days supply | Qty: 30 | Fill #0

## 2018-01-17 NOTE — Progress Notes (Signed)
Subjective:    Patient ID: Joshua Branch, male    DOB: 05/18/1961, 57 y.o.   MRN: [id number]  HPI  Pt in for CPE. He is fasting.  He is still working. He is still exercising some. Playing tennis. Pt has cut back on alcohol and junk food. Some purposeful weight loss. Son is about to start PA program.  Recommend pneumonia vaccine psv 23. Pt is diabetic. Pt declines this as well.  Pt diabetic high a1c. In past refused metformin. Now willing to start.  In past have put in colonosocopy. He has refused twice.Now willing to get done or discuss options.  Recommend hiv today screening purposes. Pt declines screen.  Pt is due for tdap. He agreed to get today.   Review of Systems  Constitutional: Negative for chills and fatigue.  HENT: Negative for congestion and drooling.   Respiratory: Negative for cough, chest tightness, shortness of breath and wheezing.   Cardiovascular: Negative for palpitations.  Gastrointestinal: Negative for abdominal distention, abdominal pain, blood in stool, constipation, diarrhea, nausea and vomiting.  Musculoskeletal: Negative for back pain, joint swelling and neck pain.  Skin: Negative for rash.  Neurological: Negative for dizziness, seizures, speech difficulty and weakness.  Hematological: Negative for adenopathy. Does not bruise/bleed easily.  Psychiatric/Behavioral: Negative for agitation, behavioral problems, confusion and suicidal ideas. The patient is nervous/anxious. The patient is not hyperactive.        Pt states his mood is very well controlled recently. But history of low level intermittent anxiety.     Past Medical History:  Diagnosis Date  . Allergy   . Charcot's joint of left foot, non-diabetic 02/03/2015  . Hypertension   . Neuropathy of left foot 02/03/2015  . Obstructive sleep apnea 05/07/2014     Social History   Socioeconomic History  . Marital status: Married    Spouse name: Not on file  . Number of children: Not on file  .  Years of education: Not on file  . Highest education level: Not on file  Occupational History  . Not on file  Social Needs  . Financial resource strain: Not on file  . Food insecurity:    Worry: Not on file    Inability: Not on file  . Transportation needs:    Medical: Not on file    Non-medical: Not on file  Tobacco Use  . Smoking status: Never Smoker  . Smokeless tobacco: Never Used  Substance and Sexual Activity  . Alcohol use: Yes  . Drug use: Not on file  . Sexual activity: Not on file  Lifestyle  . Physical activity:    Days per week: Not on file    Minutes per session: Not on file  . Stress: Not on file  Relationships  . Social connections:    Talks on phone: Not on file    Gets together: Not on file    Attends religious service: Not on file    Active member of club or organization: Not on file    Attends meetings of clubs or organizations: Not on file    Relationship status: Not on file  . Intimate partner violence:    Fear of current or ex partner: Not on file    Emotionally abused: Not on file    Physically abused: Not on file    Forced sexual activity: Not on file  Other Topics Concern  . Not on file  Social History Narrative  . Not on file  Family History  Problem Relation Age of Onset  . Diabetes Mother   . Stroke Father   . Hypertension Father   . Hearing loss Father     No Known Allergies  Current Outpatient Medications on File Prior to Visit  Medication Sig Dispense Refill  . cetirizine (ZYRTEC) 10 MG tablet Take 1 tablet (10 mg total) by mouth daily. 30 tablet 11  . hydrochlorothiazide (MICROZIDE) 12.5 MG capsule TAKE 1 CAPSULE BY MOUTH DAILY.    Marland Kitchen lisinopril-hydrochlorothiazide (PRINZIDE,ZESTORETIC) 10-12.5 MG tablet Take 1 tablet by mouth daily. 90 tablet 0  . sildenafil (REVATIO) 20 MG tablet TAKE 2 TO 5 TABLETS BY MOUTH AS NEEDED PRIOR TO SEX. (MAX DOSE PER DAY 100 MG) 100 tablet 0   No current facility-administered medications on  file prior to visit.     BP 129/86   Pulse 86   Temp 98.6 F (37 C) (Oral)   Resp 16   Ht 6\' 3"  (1.905 m)   Wt 255 lb 6.4 oz (115.8 kg)   SpO2 98%   BMI 31.92 kg/m       Objective:   Physical Exam   General Mental Status- Alert. General Appearance- Not in acute distress.   Skin General: Color- Normal Color. Moisture- Normal Moisture. Scattered small-moderate small moles all over back. S keratosis scatterd.  Neck Carotid Arteries- Normal color. Moisture- Normal Moisture. No carotid bruits. No JVD.  Chest and Lung Exam Auscultation: Breath Sounds:-Normal.  Cardiovascular Auscultation:Rythm- Regular. Murmurs & Other Heart Sounds:Auscultation of the heart reveals- No Murmurs.  Abdomen Inspection:-Inspeection Normal. Palpation/Percussion:Note:No mass. Palpation and Percussion of the abdomen reveal- Non Tender, Non Distended + BS, no rebound or guarding.    Neurologic Cranial Nerve exam:- CN III-XII intact(No nystagmus), symmetric smile. Drift Test:- No drift. Romberg Exam:- Negative.  Heal to Toe Gait exam:-Normal. Finger to Nose:- Normal/Intact Strength:- 5/5 equal and symmetric strength both upper and lower extremities.  Genital- exam.no hernia. Normal testicles.  Rectal-  Smooth prostate. nomral size. hemocult card negative.     Assessment & Plan:  For you wellness exam today I have ordered cbc, cmp ,psa, lipid panel, and UA.   Vaccine given today tdap.  Recommend exercise and healthy diet.  We will let you know lab results as they come in.  Referred to derm for skin CA screening.  Referred to colon colon cancer.  Sent rx to metformin downstairs. Diet and exercise.  Follow up date appointment will be determined after lab review.  Did discuss with him today. Offered low dose effexor in event anxiety worsens. I don't think severe enough presently and he agrees.   Mackie Pai, PA-C

## 2018-01-17 NOTE — Patient Instructions (Addendum)
For you wellness exam today I have ordered cbc, cmp ,psa, lipid panel, and UA.   Vaccine given today tdap.  Recommend exercise and healthy diet.  We will let you know lab results as they come in.  Referred to derm for skin CA screening.  Referred to colon colon cancer.  Sent rx to metformin downstairs. Diet and exercise.  Follow up date appointment will be determined after lab review.    Preventive Care 40-64 Years, Male Preventive care refers to lifestyle choices and visits with your health care provider that can promote health and wellness. What does preventive care include?  A yearly physical exam. This is also called an annual well check.  Dental exams once or twice a year.  Routine eye exams. Ask your health care provider how often you should have your eyes checked.  Personal lifestyle choices, including: ? Daily care of your teeth and gums. ? Regular physical activity. ? Eating a healthy diet. ? Avoiding tobacco and drug use. ? Limiting alcohol use. ? Practicing safe sex. ? Taking low-dose aspirin every day starting at age 29. What happens during an annual well check? The services and screenings done by your health care provider during your annual well check will depend on your age, overall health, lifestyle risk factors, and family history of disease. Counseling Your health care provider may ask you questions about your:  Alcohol use.  Tobacco use.  Drug use.  Emotional well-being.  Home and relationship well-being.  Sexual activity.  Eating habits.  Work and work Statistician.  Screening You may have the following tests or measurements:  Height, weight, and BMI.  Blood pressure.  Lipid and cholesterol levels. These may be checked every 5 years, or more frequently if you are over 75 years old.  Skin check.  Lung cancer screening. You may have this screening every year starting at age 33 if you have a 30-pack-year history of smoking and  currently smoke or have quit within the past 15 years.  Fecal occult blood test (FOBT) of the stool. You may have this test every year starting at age 38.  Flexible sigmoidoscopy or colonoscopy. You may have a sigmoidoscopy every 5 years or a colonoscopy every 10 years starting at age 36.  Prostate cancer screening. Recommendations will vary depending on your family history and other risks.  Hepatitis C blood test.  Hepatitis B blood test.  Sexually transmitted disease (STD) testing.  Diabetes screening. This is done by checking your blood sugar (glucose) after you have not eaten for a while (fasting). You may have this done every 1-3 years.  Discuss your test results, treatment options, and if necessary, the need for more tests with your health care provider. Vaccines Your health care provider may recommend certain vaccines, such as:  Influenza vaccine. This is recommended every year.  Tetanus, diphtheria, and acellular pertussis (Tdap, Td) vaccine. You may need a Td booster every 10 years.  Varicella vaccine. You may need this if you have not been vaccinated.  Zoster vaccine. You may need this after age 21.  Measles, mumps, and rubella (MMR) vaccine. You may need at least one dose of MMR if you were born in 1957 or later. You may also need a second dose.  Pneumococcal 13-valent conjugate (PCV13) vaccine. You may need this if you have certain conditions and have not been vaccinated.  Pneumococcal polysaccharide (PPSV23) vaccine. You may need one or two doses if you smoke cigarettes or if you have certain conditions.  Meningococcal vaccine. You may need this if you have certain conditions.  Hepatitis A vaccine. You may need this if you have certain conditions or if you travel or work in places where you may be exposed to hepatitis A.  Hepatitis B vaccine. You may need this if you have certain conditions or if you travel or work in places where you may be exposed to hepatitis  B.  Haemophilus influenzae type b (Hib) vaccine. You may need this if you have certain risk factors.  Talk to your health care provider about which screenings and vaccines you need and how often you need them. This information is not intended to replace advice given to you by your health care provider. Make sure you discuss any questions you have with your health care provider. Document Released: 08/12/2015 Document Revised: 04/04/2016 Document Reviewed: 05/17/2015 Elsevier Interactive Patient Education  [employer].

## 2018-01-17 NOTE — Telephone Encounter (Signed)
Opened to review 

## 2018-01-20 ENCOUNTER — Encounter: Payer: Self-pay | Admitting: Medical

## 2018-01-23 ENCOUNTER — Telehealth: Payer: Self-pay | Admitting: Medical

## 2018-01-23 MED ORDER — METFORMIN HCL ER (OSM) 1000 MG PO TB24: 1000.0000 mg | ORAL_TABLET | Freq: Every day | ORAL | 3 refills | Status: DC

## 2018-01-23 NOTE — Telephone Encounter (Signed)
Rx higher dose metformin sent to pt pharmacy.

## 2018-02-05 MED FILL — METFORMIN HCL ER 500 MG TAB: 500 | 30 days supply | Qty: 60 | Fill #0

## 2018-02-14 MED FILL — HYDROCHLOROTHIAZIDE 12.5 MG: 12.5 | 30 days supply | Qty: 30 | Fill #1

## 2018-02-18 ENCOUNTER — Encounter: Payer: Self-pay | Admitting: Gastroenterology

## 2018-03-03 ENCOUNTER — Encounter: Payer: Self-pay | Admitting: Medical

## 2018-03-03 ENCOUNTER — Other Ambulatory Visit: Payer: Self-pay | Admitting: Medical

## 2018-03-03 MED FILL — LISINOPRIL-HCTZ 10-12.5 TAB: 10-12.5 | 90 days supply | Qty: 90 | Fill #0

## 2018-03-03 MED FILL — METFORMIN HCL ER 500 MG TAB: 500 | 30 days supply | Qty: 60 | Fill #1

## 2018-03-04 ENCOUNTER — Encounter: Payer: Self-pay | Admitting: Gastroenterology

## 2018-03-04 MED FILL — ONDANSETRON ODT 8 MG TABLET: 8 | 8 days supply | Qty: 30 | Fill #0

## 2018-03-06 ENCOUNTER — Other Ambulatory Visit: Payer: Self-pay | Admitting: Family Medicine

## 2018-03-06 MED ORDER — ONDANSETRON 4 MG PO TBDP: 4.0000 mg | ORAL_TABLET | Freq: Three times a day (TID) | ORAL | 0 refills | Status: DC | PRN

## 2018-03-17 ENCOUNTER — Encounter: Payer: Self-pay | Admitting: Gastroenterology

## 2018-03-17 ENCOUNTER — Ambulatory Visit (INDEPENDENT_AMBULATORY_CARE_PROVIDER_SITE_OTHER): Payer: 59 | Admitting: Gastroenterology

## 2018-03-17 ENCOUNTER — Ambulatory Visit: Payer: 59 | Admitting: Gastroenterology

## 2018-03-17 VITALS — BP 110/70 | HR 102 | Ht 75.0 in | Wt 253.1 lb

## 2018-03-17 DIAGNOSIS — Z1211 Encounter for screening for malignant neoplasm of colon: Secondary | ICD-10-CM | POA: Diagnosis not present

## 2018-03-17 DIAGNOSIS — Z1212 Encounter for screening for malignant neoplasm of rectum: Secondary | ICD-10-CM | POA: Diagnosis not present

## 2018-03-17 DIAGNOSIS — K219 Gastro-esophageal reflux disease without esophagitis: Secondary | ICD-10-CM | POA: Diagnosis not present

## 2018-03-17 DIAGNOSIS — R1310 Dysphagia, unspecified: Secondary | ICD-10-CM

## 2018-03-17 MED ORDER — NA SULFATE-K SULFATE-MG SULF 17.5-3.13-1.6 GM/177ML PO SOLN: 1.0000 | Freq: Once | ORAL | 0 refills | Status: AC

## 2018-03-17 NOTE — Progress Notes (Signed)
Chief Complaint: Colon cancer screening , Dysphagia   Referring Provider:     Mackie Pai, PA-C     HPI:     Joshua Branch is a 57 y.o. male referred [facility] to discuss CRC screening. No prior colonoscopy.  He is otherwise without lower GI symptoms and no known family history of colon cancer.  He does endorse a recent history of solid food dysphagia pointing to his mid sternum.  Symptoms occur infrequently but does note worsening with carbonated beverages.  Unable to parse out if this occurs with carbonated beverages alone or when in conjunction with solid food ingestion.  Does endorse pain with dysphagia episodes which resolves quickly after resolution of symptoms.  No prior food impactions.  Symptoms resolved with belching and fluid ingestion.  He does endorse recent reflux symptoms of heartburn without regurgitation, and no chronic cough, globus, hoarseness.  Symptoms have improved with recent intentional weight loss of 5 pounds and dietary modifications, most notably reducing chocolate and soda intake.  He has not trialed any acid suppression medications.  He thinks his mother had similar symptoms.  No prior upper endoscopy.  Aside from the intentional weight loss, he denies any night sweats, fever, chills, nausea, vomiting, early satiety, diarrhea, hematochezia, melena.  No known family history of CRC, GI malignancy, liver disease, pancreatic disease, or IBD.    Past Medical History:  Diagnosis Date  . Allergy   . Charcot's joint of left foot, non-diabetic 02/03/2015  . Diabetes ([place])    type 2  . Hypertension   . Neuropathy of left foot 02/03/2015  . Obstructive sleep apnea 05/07/2014     History reviewed. No pertinent surgical history. Family History  Problem Relation Age of Onset  . Diabetes Mother   . Stroke Father   . Hypertension Father   . Hearing loss Father   . Colon polyps Father   . Colon cancer Neg Hx   . Esophageal  cancer Neg Hx   . Prostate cancer Neg Hx    Social History   Tobacco Use  . Smoking status: Never Smoker  . Smokeless tobacco: Never Used  Substance Use Topics  . Alcohol use: Yes  . Drug use: Not on file   Current Outpatient Medications  Medication Sig Dispense Refill  . hydrochlorothiazide (MICROZIDE) 12.5 MG capsule TAKE 1 CAPSULE BY MOUTH DAILY.    Marland Kitchen lisinopril-hydrochlorothiazide (PRINZIDE,ZESTORETIC) 10-12.5 MG tablet TAKE 1 TABLET BY MOUTH DAILY. 90 tablet 0  . metFORMIN (GLUCOPHAGE-XR) 500 MG 24 hr tablet Take 1 tablet (500 mg total) by mouth daily with breakfast. 30 tablet 0  . sildenafil (REVATIO) 20 MG tablet TAKE 2 TO 5 TABLETS BY MOUTH AS NEEDED PRIOR TO SEX. (MAX DOSE PER DAY 100 MG) 100 tablet 0  . cetirizine (ZYRTEC) 10 MG tablet Take 1 tablet (10 mg total) by mouth daily. (Patient not taking: Reported on 03/17/2018) 30 tablet 11   No current facility-administered medications for this visit.    Allergies  Allergen Reactions  . Other     Fruits with a pit. Throat swells up. Can have them if cooked      Review of Systems: All systems reviewed and negative except where noted in HPI.     Physical Exam:    Wt Readings from Last 3 Encounters:  03/17/18 253 lb 2 oz (114.8 kg)  01/17/18 255 lb 6.4 oz (115.8 kg)  12/07/16  265 lb 6.4 oz (120.4 kg)    BP 110/70   Pulse (!) 102   Ht 6' 3" (1.905 m)   Wt 253 lb 2 oz (114.8 kg)   BMI 31.64 kg/m  Constitutional:  Pleasant, in no acute distress. Psychiatric: Normal mood and affect. Behavior is normal. EENT: Pupils normal.  Conjunctivae are normal. No scleral icterus. Neck supple. No cervical LAD. Cardiovascular: Normal rate, regular rhythm. No edema Pulmonary/chest: Effort normal and breath sounds normal. No wheezing, rales or rhonchi. Abdominal: Soft, nondistended, nontender. Bowel sounds active throughout. There are no masses palpable. No hepatomegaly. Neurological: Alert and oriented to person place and  time. Skin: Skin is warm and dry. No rashes noted.   ASSESSMENT AND PLAN;   1) CRC screening Joshua Branch is a 57 y.o. male presenting to [facility] for initial CRC screening. No family history of CRC or related malignancies, and patient is without any active lower GI sxs. No previous CRC screening to date. Discussed options for CRC screening, to include optical colonoscopy, ColoGuard, FIT kit testing, and the patient decided to proceed with an optical colonoscopy.   - Scheduled for colonsocopy on 04/15/18  2) Dysphagia Discussed potential etiologies for his intermittent solid food dysphagia over last year. Will eval with EGD with dil as inidicated at time of procedure. If no etiology noted and sxs persist, can further eval with EM, particularly given ?relationship with carbonated beverages.  -We will add EGD to already scheduled colonoscopy on 04/15/2018  3) [name], [name], HH, [name], or if sxs persist, can discuss trial of acid suppression therapy.  The indications, risks, [name], perforation, adverse reaction to medications, [name], hositalization, [name], [name]     Lavena Bullion, DO, FACG  03/17/2018, 10:36 AM   [place], Percell Miller, PA-C

## 2018-03-17 NOTE — Patient Instructions (Addendum)
If you are age 55 or older, your body mass index should be between 23-30. Your Body mass index is 31.64 kg/m. If this is out of the aforementioned range listed, please consider follow up with your Primary Care Provider.  If you are age 57 or younger, your body mass index should be between 19-25. Your Body mass index is 31.64 kg/m. If this is out of the aformentioned range listed, please consider follow up with your Primary Care Provider.   You have been scheduled for an endoscopy and colonoscopy. Please follow the written instructions given to you at your visit today. Please pick up your prep supplies at the pharmacy within the next 1-3 days. If you use inhalers (even only as needed), please bring them with you on the day of your procedure. Your physician has requested that you go to www.startemmi.com and enter the access code given to you at your visit today. This web site gives a general overview about your procedure. However, you should still follow specific instructions given to you by our office regarding your preparation for the procedure.  We have sent the following medications to your pharmacy for you to pick up at your convenience: Suprep  It was a pleasure to see you today!  Vito Cirigliano, D.O.

## 2018-03-24 MED FILL — HYDROCHLOROTHIAZIDE 12.5 MG: 12.5 | 90 days supply | Qty: 90 | Fill #2

## 2018-03-25 MED FILL — SUPREP BOWEL PREP KIT: 17.5-3.13-1 | 1 days supply | Qty: 354 | Fill #0

## 2018-04-01 ENCOUNTER — Encounter: Payer: Self-pay | Admitting: Gastroenterology

## 2018-04-07 MED FILL — METFORMIN HCL ER 500 MG TAB: 500 | 30 days supply | Qty: 60 | Fill #2

## 2018-04-08 ENCOUNTER — Encounter: Payer: Self-pay | Admitting: Sports Medicine

## 2018-04-08 ENCOUNTER — Other Ambulatory Visit: Payer: Self-pay | Admitting: Sports Medicine

## 2018-04-08 ENCOUNTER — Ambulatory Visit (INDEPENDENT_AMBULATORY_CARE_PROVIDER_SITE_OTHER): Payer: 59 | Admitting: Sports Medicine

## 2018-04-08 ENCOUNTER — Ambulatory Visit (INDEPENDENT_AMBULATORY_CARE_PROVIDER_SITE_OTHER): Payer: 59

## 2018-04-08 VITALS — BP 137/93 | HR 82

## 2018-04-08 DIAGNOSIS — L02612 Cutaneous abscess of left foot: Secondary | ICD-10-CM

## 2018-04-08 DIAGNOSIS — E1142 Type 2 diabetes mellitus with diabetic polyneuropathy: Secondary | ICD-10-CM | POA: Diagnosis not present

## 2018-04-08 DIAGNOSIS — L97921 Non-pressure chronic ulcer of unspecified part of left lower leg limited to breakdown of skin: Secondary | ICD-10-CM

## 2018-04-08 DIAGNOSIS — L03032 Cellulitis of left toe: Secondary | ICD-10-CM | POA: Diagnosis not present

## 2018-04-08 DIAGNOSIS — L97911 Non-pressure chronic ulcer of unspecified part of right lower leg limited to breakdown of skin: Secondary | ICD-10-CM

## 2018-04-08 DIAGNOSIS — E11610 Type 2 diabetes mellitus with diabetic neuropathic arthropathy: Secondary | ICD-10-CM | POA: Diagnosis not present

## 2018-04-08 MED ORDER — AMOXICILLIN-POT CLAVULANATE 875-125 MG PO TABS: 1.0000 | ORAL_TABLET | Freq: Two times a day (BID) | ORAL | 0 refills | Status: DC

## 2018-04-08 MED ORDER — CADEXOMER IODINE 0.9 % EX GEL: 1.0000 "application " | Freq: Every day | CUTANEOUS | 0 refills | Status: DC | PRN

## 2018-04-08 MED FILL — AMOX-CLAV 875-125 MG TABLET: 875-125 | 14 days supply | Qty: 28 | Fill #0

## 2018-04-08 NOTE — Progress Notes (Signed)
Subjective: Joshua Branch is a 57 y.o. male patient seen in office for evaluation of ulceration of the left 2nd toe and plantar forefoot bilateral Patient has a history of diabetes and a blood glucose level today not recorded but A1c really high was diagnosed with diabetes 2 months ago.  Patient is changing the dressing using antibiotic cream daily and reports that he got these wound 3 weeks ago when he was walking barefoot on [employer]. Denies nausea/fever/vomiting/chills/night sweats/shortness of breath/pain. Patient has no other pedal complaints at this time.  Review of Systems  All other systems reviewed and are negative.   Patient Active Problem List   Diagnosis Date Noted  . Neuropathy of left foot 02/03/2015  . Charcot's joint of left foot, non-diabetic 02/03/2015  . 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 Hyperglycemia 07/21/2014  . Cramping of hands 06/02/2014  . Eustachian tube dysfunction 06/02/2014  . HTN (hypertension) 05/07/2014  . Obstructive sleep apnea 05/07/2014   Current Outpatient Medications on File Prior to Visit  Medication Sig Dispense Refill  . cetirizine (ZYRTEC) 10 MG tablet Take 1 tablet (10 mg total) by mouth daily. 30 tablet 11  . hydrochlorothiazide (MICROZIDE) 12.5 MG capsule TAKE 1 CAPSULE BY MOUTH DAILY.    Marland Kitchen lisinopril-hydrochlorothiazide (PRINZIDE,ZESTORETIC) 10-12.5 MG tablet TAKE 1 TABLET BY MOUTH DAILY. 90 tablet 0  . metFORMIN (GLUCOPHAGE-XR) 500 MG 24 hr tablet Take 1 tablet (500 mg total) by mouth daily with breakfast. 30 tablet 0  . sildenafil (REVATIO) 20 MG tablet TAKE 2 TO 5 TABLETS BY MOUTH AS NEEDED PRIOR TO SEX. (MAX DOSE PER DAY 100 MG) 100 tablet 0   No current facility-administered medications on file prior to visit.    Allergies  Allergen Reactions  . Other     Fruits with a pit. Throat swells up. Can have them if cooked     Recent Results (from the past 2160 hour(s))  CBC w/Diff     Status: Abnormal   Collection Time: 01/17/18   9:56 AM  Result Value Ref Range   WBC 7.6 4.0 - 10.5 K/uL   RBC 5.52 4.22 - 5.81 Mil/uL   Hemoglobin 17.7 (H) 13.0 - 17.0 g/dL   HCT 50.4 39.0 - 52.0 %   MCV 91.3 78.0 - 100.0 fl   MCHC 35.1 30.0 - 36.0 g/dL   RDW 12.4 11.5 - 15.5 %   Platelets 229.0 150.0 - 400.0 K/uL   Neutrophils Relative % 61.5 43.0 - 77.0 %   Lymphocytes Relative 27.3 12.0 - 46.0 %   Monocytes Relative 8.9 3.0 - 12.0 %   Eosinophils Relative 1.4 0.0 - 5.0 %   Basophils Relative 0.9 0.0 - 3.0 %   Neutro Abs 4.7 1.4 - 7.7 K/uL   Lymphs Abs 2.1 0.7 - 4.0 K/uL   Monocytes Absolute 0.7 0.1 - 1.0 K/uL   Eosinophils Absolute 0.1 0.0 - 0.7 K/uL   Basophils Absolute 0.1 0.0 - 0.1 K/uL  Comp Met (CMET)     Status: Abnormal   Collection Time: 01/17/18  9:56 AM  Result Value Ref Range   Sodium 135 135 - 145 mEq/L   Potassium 4.1 3.5 - 5.1 mEq/L   Chloride 97 96 - 112 mEq/L   CO2 26 19 - 32 mEq/L   Glucose, Bld 352 (H) 70 - 99 mg/dL   BUN 20 6 - 23 mg/dL   Creatinine, Ser 1.01 0.40 - 1.50 mg/dL   Total Bilirubin 0.8 0.2 - 1.2 mg/dL  Alkaline Phosphatase 86 39 - 117 U/L   AST 24 0 - 37 U/L   ALT 48 0 - 53 U/L   Total Protein 7.0 6.0 - 8.3 g/dL   Albumin 4.4 3.5 - 5.2 g/dL   Calcium 9.5 8.4 - 10.5 mg/dL   GFR 81.38 >60.00 mL/min  Lipid panel     Status: Abnormal   Collection Time: 01/17/18  9:56 AM  Result Value Ref Range   Cholesterol 215 (H) 0 - 200 mg/dL    Comment: ATP III Classification       Desirable:  < 200 mg/dL               Borderline High:  200 - 239 mg/dL          High:  > = 240 mg/dL   Triglycerides 374.0 (H) 0.0 - 149.0 mg/dL    Comment: Normal:  <150 mg/dLBorderline High:  150 - 199 mg/dL   HDL 39.60 >39.00 mg/dL   VLDL 74.8 (H) 0.0 - 40.0 mg/dL   Total CHOL/HDL Ratio 5     Comment:                Men          Women1/2 Average Risk     3.4          3.3Average Risk          5.0          4.42X Average Risk          9.6          7.13X Average Risk          15.0          11.0                        NonHDL 175.38     Comment: NOTE:  Non-HDL goal should be 30 mg/dL higher than patient's LDL goal (i.e. LDL goal of < 70 mg/dL, would have non-HDL goal of < 100 mg/dL)  Hemoglobin A1c     Status: Abnormal   Collection Time: 01/17/18  9:56 AM  Result Value Ref Range   Hgb A1c MFr Bld 11.7 (H) 4.6 - 6.5 %    Comment: Glycemic Control Guidelines for People with Diabetes:Non Diabetic:  <6%Goal of Therapy: <7%Additional Action Suggested:  >8%   PSA     Status: None   Collection Time: 01/17/18  9:56 AM  Result Value Ref Range   PSA 1.05 0.10 - 4.00 ng/mL    Comment: Test performed using Access Hybritech PSA Assay, a parmagnetic partical, chemiluminecent immunoassay.  LDL cholesterol, direct     Status: None   Collection Time: 01/17/18  9:56 AM  Result Value Ref Range   Direct LDL 93.0 mg/dL    Comment: Optimal:  <100 mg/dLNear or Above Optimal:  100-129 mg/dLBorderline High:  130-159 mg/dLHigh:  160-189 mg/dLVery High:  >190 mg/dL  POCT Urinalysis Dipstick (Automated)     Status: Abnormal   Collection Time: 01/17/18 12:09 PM  Result Value Ref Range   Color, UA yellow    Clarity, UA clear    Glucose, UA Positive (A) Negative   Bilirubin, UA neg    Ketones, UA neg    Spec Grav, UA 1.025 1.010 - 1.025   Blood, UA neg    pH, UA 5.5 5.0 - 8.0   Protein, UA Negative Negative   Urobilinogen, UA 1.0 0.2 or 1.0  E.U./dL   Nitrite, UA neg    Leukocytes, UA Negative Negative    Objective: There were no vitals filed for this visit.  General: Patient is awake, alert, oriented x 3 and in no acute distress.  Dermatology: Skin is warm and dry bilateral with a full thickness ulceration present Right plantar forefoot. Ulceration measures 2 cm x 2cm x 0.3 cm. There is a keratotic border with a granular base. The ulceration does not  probe to bone. There is no malodor, no active drainage, no erythema, no edema. No other acute signs of infection.  There is a full thickness ulceration present Left  plantar forefoot. Ulceration measures 1 cm x 1.4cm x 0.3 cm. There is a keratotic border with a granular base. The ulceration does not probe to bone. There is no malodor, no active drainage, no erythema, no edema. No other acute signs of infection.  There is a full thickness ulceration present Left 2nd toe. Ulceration measures 1 cm x 1cm x 0.3 cm. There is a keratotic border with a granular base. The ulceration does not probe to bone. There is no malodor, no active drainage, no erythema, + edema to toe. No other acute signs of infection.   Vascular: Dorsalis Pedis pulse = 2/4 Bilateral,  Posterior Tibial pulse = 1/4 Bilateral,  Capillary Fill Time < 5 seconds  Neurologic: Protective sensation absent bilateral.  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 No Pain with palpation to ulcerated areas. No pain with compression to calves bilateral.   Xrays, Left/Right foot:Osteolysis at 1st TMTJ consistent with charcot. No bony destruction suggestive of osteomyelitis at areas of ulceration. No gas in soft tissues. No other acute findings.   No results for input(s): GRAMSTAIN, LABORGA in the last 8760 hours.  Assessment and Plan:  Problem List Items Addressed This Visit    None    Visit Diagnoses    Ulcers of both lower extremities, limited to breakdown of skin ([name]    -  Primary   Relevant Medications   amoxicillin-clavulanate (AUGMENTIN) 875-125 MG tablet   cadexomer iodine (IODOSORB) 0.9 % gel   Other Relevant Orders   DG Foot Complete Right   DG Foot Complete Left   Cellulitis and abscess of toe of left foot       Diabetic polyneuropathy associated with type 2 diabetes mellitus ([name]       Charcot foot due to diabetes mellitus ([facility])         -Examined patient and discussed the progression of the wound and treatment alternatives. -Xrays reviewed - Excisionally dedbrided ulceration at right and left foot to healthy bleeding borders removing nonviable tissue using a sterile chisel blade.  Wound measures post debridement as above.  Wounds were debrided to the level of the dermis with viable wound base exposed to promote healing. Hemostasis was achieved with manuel pressure. Patient tolerated procedure well without any discomfort or anesthesia necessary for this wound debridement.  -Applied iodosorb and dry sterile dressing and instructed patient to continue with daily dressings at home consisting of same. -Wound cultures obtained -Rx Augmentin and post op shoes to offload ulcerations - Advised patient to go to the ER or return to office if the wound worsens or if constitutional symptoms are present. -Patient to return to office in 2 weeks for follow up care and evaluation or sooner if problems arise.  Landis Martins, DPM

## 2018-04-11 ENCOUNTER — Telehealth: Payer: Self-pay | Admitting: *Deleted

## 2018-04-11 ENCOUNTER — Telehealth: Payer: Self-pay | Admitting: Gastroenterology

## 2018-04-11 ENCOUNTER — Other Ambulatory Visit: Payer: Self-pay | Admitting: Podiatry

## 2018-04-11 LAB — WOUND CULTURE
MICRO NUMBER: 91085808
MICRO NUMBER: 91081827
SPECIMEN QUALITY: ADEQUATE
SPECIMEN QUALITY: ADEQUATE

## 2018-04-11 MED ORDER — CIPROFLOXACIN HCL 500 MG PO TABS: 500.0000 mg | ORAL_TABLET | Freq: Two times a day (BID) | ORAL | 0 refills | Status: DC

## 2018-04-11 MED ORDER — SULFAMETHOXAZOLE-TRIMETHOPRIM 800-160 MG PO TABS: 1.0000 | ORAL_TABLET | Freq: Two times a day (BID) | ORAL | 0 refills | Status: DC

## 2018-04-11 MED FILL — CIPROFLOXACIN HCL 500 MG TA: 500 | 10 days supply | Qty: 20 | Fill #0

## 2018-04-11 MED FILL — SULFAMETHOXAZOLE-TMP DS TAB: 800-160 | 10 days supply | Qty: 20 | Fill #0

## 2018-04-11 NOTE — Progress Notes (Signed)
Patient called about his wound culture. Discussed with Dr. Cannon Kettle and prescribed Bactirm/Cipro per Dr. Cannon Kettle.   He has a colonoscopy next week. Informed him to talk to his doctor about it as he has the infection and likely needs to be postponed.   Trula Slade

## 2018-04-11 NOTE — Telephone Encounter (Signed)
Dr. Cannon Kettle texted Dr. Jacqualyn Posey to take care of this pt's wound culture and orders. Dr. Jacqualyn Posey informed me he would take care of the orders and calling the pt.

## 2018-04-11 NOTE — Telephone Encounter (Signed)
-----   Message from Landis Martins, [place] sent at 04/11/2018  2:08 PM EDT ----- + MRSA and Psuedomonas Have patient Discontinue Augmentin and start Cipro 500 bid x 10 days and Bactrim DS bid x 10 days Thanks Dr. Chauncey Cruel

## 2018-04-11 NOTE — Telephone Encounter (Signed)
Pt is scheduled for an endo colon next Tuesday with Dr. Bryan Lemma. Today he was put on antibiotics due to a foot infection, he has to take them for two weeks, he wants to know if he can still have procedure.

## 2018-04-11 NOTE — Telephone Encounter (Signed)
Patient notified okay to keep the procedure as scheduled.  He is asked to call back if he has any changes to his symptoms or a fever.

## 2018-04-15 ENCOUNTER — Encounter: Payer: Self-pay | Admitting: Gastroenterology

## 2018-04-15 ENCOUNTER — Ambulatory Visit (AMBULATORY_SURGERY_CENTER): Payer: 59 | Admitting: Gastroenterology

## 2018-04-15 ENCOUNTER — Encounter: Payer: 59 | Admitting: Gastroenterology

## 2018-04-15 VITALS — BP 134/81 | HR 67 | Temp 98.6°F | Resp 19 | Ht 63.0 in | Wt 253.0 lb

## 2018-04-15 DIAGNOSIS — D122 Benign neoplasm of ascending colon: Secondary | ICD-10-CM | POA: Diagnosis not present

## 2018-04-15 DIAGNOSIS — I10 Essential (primary) hypertension: Secondary | ICD-10-CM | POA: Diagnosis not present

## 2018-04-15 DIAGNOSIS — D123 Benign neoplasm of transverse colon: Secondary | ICD-10-CM | POA: Diagnosis not present

## 2018-04-15 DIAGNOSIS — D128 Benign neoplasm of rectum: Secondary | ICD-10-CM | POA: Diagnosis not present

## 2018-04-15 DIAGNOSIS — K5730 Diverticulosis of large intestine without perforation or abscess without bleeding: Secondary | ICD-10-CM | POA: Diagnosis not present

## 2018-04-15 DIAGNOSIS — K2990 Gastroduodenitis, unspecified, without bleeding: Secondary | ICD-10-CM

## 2018-04-15 DIAGNOSIS — D125 Benign neoplasm of sigmoid colon: Secondary | ICD-10-CM

## 2018-04-15 DIAGNOSIS — K2970 Gastritis, unspecified, without bleeding: Secondary | ICD-10-CM

## 2018-04-15 DIAGNOSIS — Z1211 Encounter for screening for malignant neoplasm of colon: Secondary | ICD-10-CM | POA: Diagnosis not present

## 2018-04-15 DIAGNOSIS — K2950 Unspecified chronic gastritis without bleeding: Secondary | ICD-10-CM | POA: Diagnosis not present

## 2018-04-15 DIAGNOSIS — E119 Type 2 diabetes mellitus without complications: Secondary | ICD-10-CM | POA: Diagnosis not present

## 2018-04-15 DIAGNOSIS — K2100 Gastro-esophageal reflux disease with esophagitis, without bleeding: Secondary | ICD-10-CM

## 2018-04-15 DIAGNOSIS — R1310 Dysphagia, unspecified: Secondary | ICD-10-CM

## 2018-04-15 DIAGNOSIS — R1319 Other dysphagia: Secondary | ICD-10-CM

## 2018-04-15 DIAGNOSIS — G4733 Obstructive sleep apnea (adult) (pediatric): Secondary | ICD-10-CM | POA: Diagnosis not present

## 2018-04-15 MED ORDER — SODIUM CHLORIDE 0.9 % IV SOLN: 500.0000 mL | Freq: Once | INTRAVENOUS | Status: DC

## 2018-04-15 NOTE — Progress Notes (Signed)
Called to room to assist during endoscopic procedure.  Patient ID and intended procedure confirmed with present staff. Received instructions for my participation in the procedure from the performing physician.  

## 2018-04-15 NOTE — Op Note (Signed)
[name] Patient Name: Joshua Branch Procedure Date: 04/15/2018 7:35 AM MRN: [id number] Endoscopist: Gerrit Heck , MD Age: 57 Referring MD:  Date of Birth: 06-07-1961 Gender: Male Account #: [id number] Procedure:                Colonoscopy Indications:              Screening for colorectal malignant neoplasm, This                            is the patient's first colonoscopy Medicines:                Monitored Anesthesia Care Procedure:                Pre-Anesthesia Assessment:                           - Prior to the procedure, a History and Physical                            was performed, and patient medications and                            allergies were reviewed. [name] tolerance of                            previous anesthesia was also reviewed. The risks                            and benefits of the procedure and the sedation                            options and risks were discussed with the patient.                            All questions were answered, and informed consent                            was obtained. Prior Anticoagulants: The patient has                            taken aspirin, last dose was 1 day prior to                            procedure. ASA Grade Assessment: II - A patient                            with mild systemic disease. After reviewing the                            risks and benefits, the patient was deemed in                            satisfactory condition to undergo the procedure.  After obtaining informed consent, the colonoscope                            was passed under direct vision. Throughout the                            procedure, the patient's blood pressure, pulse, and                            oxygen saturations were monitored continuously. The                            Colonoscope was introduced through the anus and                            advanced to the the terminal  ileum. The colonoscopy                            was performed without difficulty. The patient                            tolerated the procedure well. The quality of the                            bowel preparation was adequate. Scope In: 7:47:53 AM Scope Out: 8:22:09 AM Scope Withdrawal Time: 0 hours 32 minutes 24 seconds  Total Procedure Duration: 0 hours 34 minutes 16 seconds  Findings:                 The perianal and digital rectal examinations were                            normal.                           A 15 mm polyp was found in the ascending colon. The                            polyp was pedunculated. The polyp was removed with                            a hot snare. Resection and retrieval were complete.                            To prevent bleeding after the polypectomy, one                            hemostatic clip was successfully placed. There was                            no bleeding at the end of the procedure.                           Three sessile polyps were found in the rectum  and                            transverse colon. The polyps were 2 to 4 mm in                            size. The transverse polyps were removed with a                            cold snare and the rectal polyp removed with cold                            forceps. Resection and retrieval were complete.                            Estimated blood loss was minimal.                           A 8 mm polyp was found in the sigmoid colon. The                            polyp was pedunculated. The polyp was removed with                            a hot snare. Resection and retrieval were complete.                            To prevent bleeding after the polypectomy, one                            hemostatic clip was successfully placed. There was                            no bleeding at the end of the procedure.                           Multiple small-mouthed diverticula were found in                             the sigmoid colon.                           The retroflexed view of the distal rectum and anal                            verge was normal and showed no additional anal or                            rectal abnormalities.                           The terminal ileum appeared normal. Complications:            No immediate complications. Estimated Blood Loss:  Estimated blood loss was minimal. Impression:               - One 15 mm polyp in the ascending colon, removed                            with a hot snare. Resected and retrieved. Clip was                            placed.                           - Three 2 to 4 mm polyps in the rectum and in the                            transverse colon, removed with a cold snare.                            Resected and retrieved.                           - One 8 mm polyp in the sigmoid colon, removed with                            a hot snare. Resected and retrieved. Clip was                            placed.                           - Diverticulosis in the sigmoid colon.                           - The distal rectum and anal verge are normal on                            retroflexion view.                           - The examined portion of the ileum was normal. Recommendation:           - Patient has a contact number available for                            emergencies. The signs and symptoms of potential                            delayed complications were discussed with the                            patient. Return to normal activities tomorrow.                            Written discharge instructions were provided to the  patient.                           - Resume previous diet today.                           - No aspirin, ibuprofen, naproxen, or other                            non-steroidal anti-inflammatory drugs for 5 days                            after polyp removal.                            - Await pathology results.                           - Repeat colonoscopy in 3 years for surveillance.                           - Return to GI office at appointment to be                            scheduled. Gerrit Heck, MD 04/15/2018 8:41:03 AM

## 2018-04-15 NOTE — Patient Instructions (Signed)
Discharge instructions given. Handouts on polyps,diverticulosis,gastritis,Gerd and a dilatation diet.  No aspirin,ibuprofen,naproxen, or other non-steroidal anti-inflammatory drugs for 5 days.  Office will call to schedule return office visit. Card for clip given. Resume previous medications. YOU HAD AN ENDOSCOPIC PROCEDURE TODAY AT [facility]:   Refer to the procedure report that was given to you for any specific questions about what was found during the examination.  If the procedure report does not answer your questions, please call your gastroenterologist to clarify.  If you requested that your care partner not be given the details of your procedure findings, then the procedure report has been included in a sealed envelope for you to review at your convenience later.  YOU SHOULD EXPECT: Some feelings of bloating in the abdomen. Passage of more gas than usual.  Walking can help get rid of the air that was put into your GI tract during the procedure and reduce the bloating. If you had a lower endoscopy (such as a colonoscopy or flexible sigmoidoscopy) you may notice spotting of blood in your stool or on the toilet paper. If you underwent a bowel prep for your procedure, you may not have a normal bowel movement for a few days.  Please Note:  You might notice some irritation and congestion in your nose or some drainage.  This is from the oxygen used during your procedure.  There is no need for concern and it should clear up in a day or so.  SYMPTOMS TO REPORT IMMEDIATELY:   Following lower endoscopy (colonoscopy or flexible sigmoidoscopy):  Excessive amounts of blood in the stool  Significant tenderness or worsening of abdominal pains  Swelling of the abdomen that is new, acute  Fever of 100F or higher   Following upper endoscopy (EGD)  Vomiting of blood or coffee ground material  New chest pain or pain under the shoulder blades  Painful or persistently difficult  swallowing  New shortness of breath  Fever of 100F or higher  Black, tarry-looking stools  For urgent or emergent issues, a gastroenterologist can be reached at any hour by calling [phone].   DIET:  We do recommend a small meal at first, but then you may proceed to your regular diet.  Drink plenty of fluids but you should avoid alcoholic beverages for 24 hours.  ACTIVITY:  You should plan to take it easy for the rest of today and you should NOT DRIVE or use heavy machinery until tomorrow (because of the sedation medicines used during the test).    FOLLOW UP: Our staff will call the number listed on your records the next business day following your procedure to check on you and address any questions or concerns that you may have regarding the information given to you following your procedure. If we do not reach you, we will leave a message.  However, if you are feeling well and you are not experiencing any problems, there is no need to return our call.  We will assume that you have returned to your regular daily activities without incident.  If any biopsies were taken you will be contacted by phone or by letter within the next 1-3 weeks.  Please call us at [phone] if you have not heard about the biopsies in 3 weeks.    SIGNATURES/CONFIDENTIALITY: You and/or your care partner have signed paperwork which will be entered into your electronic medical record.  These signatures attest to the fact that that the information above on your  After Visit Summary has been reviewed and is understood.  Full responsibility of the confidentiality of this discharge information lies with you and/or your care-partner.

## 2018-04-15 NOTE — Progress Notes (Signed)
Patient reports that he has an ongoing diabetic foot wound and is undergoing antibiotic treatment for the last 4 weeks. No fever or chills or any other signs of infection. He is following up with a podiatrist weekly. SM

## 2018-04-15 NOTE — Op Note (Signed)
[place] Patient Name: Joshua Branch Procedure Date: 04/15/2018 7:35 AM MRN: [id number] Endoscopist: Gerrit Heck , MD Age: 57 Referring MD:  Date of Birth: 09-13-1960 Gender: Male Account #: [id number] Procedure:                Upper GI endoscopy Indications:              Dysphagia, Heartburn Medicines:                Monitored Anesthesia Care Procedure:                Pre-Anesthesia Assessment:                           - Prior to the procedure, a History and Physical                            was performed, and patient medications and                            allergies were reviewed. [name] tolerance of                            previous anesthesia was also reviewed. The risks                            and benefits of the procedure and the sedation                            options and risks were discussed with the patient.                            All questions were answered, and informed consent                            was obtained. Prior Anticoagulants: The patient has                            taken aspirin, last dose was 1 day prior to                            procedure. ASA Grade Assessment: II - A patient                            with mild systemic disease. After reviewing the                            risks and benefits, the patient was deemed in                            satisfactory condition to undergo the procedure.                           After obtaining informed consent, the endoscope was  passed under direct vision. Throughout the                            procedure, the patient's blood pressure, pulse, and                            oxygen saturations were monitored continuously. The                            Endoscope was introduced through the mouth, and                            advanced to the second part of duodenum. The upper                            GI endoscopy was accomplished without  difficulty.                            The patient tolerated the procedure well. Scope In: Scope Out: Findings:                 LA Grade A (one or more mucosal breaks less than 5                            mm, not extending between tops of 2 mucosal folds)                            esophagitis with no bleeding was found 44 cm from                            the incisors.                           Esophagogastric landmarks were identified: the                            Z-line was found at 44 cm, the gastroesophageal                            junction was found at 44 cm and the site of hiatal                            narrowing was found at 44 cm from the incisors.                           The gastroesophageal flap valve was visualized                            endoscopically and classified as Hill Grade II                            (fold present, opens with respiration).  The upper third of the esophagus, middle third of                            the esophagus and lower third of the esophagus were                            otherwise normal. The scope was withdrawn. Dilation                            was performed with a Maloney dilator with no                            resistance at 52 Fr. The dilation site was examined                            following endoscope reinsertion and showed no                            bleeding, mucosal tear or perforation. Estimated                            blood loss: none.                           Multiple non-bleeding erosions and localized                            gastritis were found in the gastric antrum. There                            were no stigmata of recent bleeding. Biopsies were                            taken with a cold forceps for Helicobacter pylori                            testing. Estimated blood loss was minimal.                           The gastric fundus and gastric body were normal.                            The duodenal bulb, first portion of the duodenum                            and second portion of the duodenum were normal. Complications:            No immediate complications. Estimated Blood Loss:     Estimated blood loss was minimal. Impression:               - LA Grade A reflux esophagitis.                           - Esophagogastric landmarks identified.                           -  Gastroesophageal flap valve classified as Hill                            Grade II (fold present, opens with respiration).                           - Normal upper third of esophagus, middle third of                            esophagus and lower third of esophagus. Dilated.                           - Gastric erosions without bleeding. Biopsied.                           - Normal gastric fundus and gastric body.                           - Normal duodenal bulb, first portion of the                            duodenum and second portion of the duodenum. Recommendation:           - Patient has a contact number available for                            emergencies. The signs and symptoms of potential                            delayed complications were discussed with the                            patient. Return to normal activities tomorrow.                            Written discharge instructions were provided to the                            patient.                           - Resume previous diet today.                           - Continue present medications.                           - Await pathology results.                           - Use Protonix (pantoprazole) 40 mg PO BID for 6                            weeks to promote mucosal healing, then reduce to  lowest effective dose to treat reflux.                           - Return to GI clinic at appointment to be                            scheduled. Gerrit Heck, MD 04/15/2018 8:36:09 AM

## 2018-04-15 NOTE — Progress Notes (Signed)
Report to PACU, RN, vss, BBS= Clear.  

## 2018-04-16 ENCOUNTER — Telehealth: Payer: Self-pay | Admitting: *Deleted

## 2018-04-16 MED FILL — OLOPATADINE HCL 0.2 % SOLN: 0.2 | 75 days supply | Qty: 8 | Fill #2

## 2018-04-16 NOTE — Telephone Encounter (Signed)
No answer, left message to call if questions or concerns. 

## 2018-04-16 NOTE — Telephone Encounter (Signed)
Message left

## 2018-04-16 NOTE — Telephone Encounter (Signed)
Pt returned call and said he is doing good 

## 2018-04-21 ENCOUNTER — Encounter: Payer: Self-pay | Admitting: Gastroenterology

## 2018-04-22 ENCOUNTER — Encounter: Payer: Self-pay | Admitting: Sports Medicine

## 2018-04-22 ENCOUNTER — Telehealth: Payer: Self-pay | Admitting: Medical

## 2018-04-22 ENCOUNTER — Ambulatory Visit (INDEPENDENT_AMBULATORY_CARE_PROVIDER_SITE_OTHER): Payer: 59 | Admitting: Sports Medicine

## 2018-04-22 DIAGNOSIS — E11610 Type 2 diabetes mellitus with diabetic neuropathic arthropathy: Secondary | ICD-10-CM

## 2018-04-22 DIAGNOSIS — E1142 Type 2 diabetes mellitus with diabetic polyneuropathy: Secondary | ICD-10-CM

## 2018-04-22 DIAGNOSIS — L97911 Non-pressure chronic ulcer of unspecified part of right lower leg limited to breakdown of skin: Secondary | ICD-10-CM

## 2018-04-22 DIAGNOSIS — L97921 Non-pressure chronic ulcer of unspecified part of left lower leg limited to breakdown of skin: Secondary | ICD-10-CM

## 2018-04-22 NOTE — Progress Notes (Signed)
Subjective: Joshua Branch is a 57 y.o. male patient seen in office for follow up evaluation of ulceration of the left 2nd toe and plantar forefoot bilateral Patient has a history of diabetes and a blood glucose level today not recorded but "good" per patient.  Patient is changing the dressing using iodosorb cream daily and reports that finished antibiotics by mouth and drainage is better. Denies nausea/fever/vomiting/chills/night sweats/shortness of breath/pain. Patient has no other pedal complaints at this time.    Patient Active Problem List   Diagnosis Date Noted  . 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 Vertigo 08/20/2016  . Myringotomy tube status 08/20/2016  . Ear fullness, left 08/20/2016  . Asymmetrical left sensorineural hearing loss 02/06/2016  . Neuropathy of left foot 02/03/2015  . Charcot's joint of left foot, non-diabetic 02/03/2015  . 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 Hyperglycemia 07/21/2014  . Cramping of hands 06/02/2014  . Eustachian tube dysfunction 06/02/2014  . HTN (hypertension) 05/07/2014  . Obstructive sleep apnea 05/07/2014   Current Outpatient Medications on File Prior to Visit  Medication Sig Dispense Refill  . amoxicillin-clavulanate (AUGMENTIN) 875-125 MG tablet Take 1 tablet by mouth 2 (two) times daily. (Patient not taking: Reported on 04/15/2018) 28 tablet 0  . cadexomer iodine (IODOSORB) 0.9 % gel Apply 1 application topically daily as needed for wound care. 40 g 0  . cetirizine (ZYRTEC) 10 MG tablet Take 1 tablet (10 mg total) by mouth daily. 30 tablet 11  . ciprofloxacin (CIPRO) 500 MG tablet Take 1 tablet (500 mg total) by mouth 2 (two) times daily. 20 tablet 0  . hydrochlorothiazide (MICROZIDE) 12.5 MG capsule TAKE 1 CAPSULE BY MOUTH DAILY.    Marland Kitchen lisinopril-hydrochlorothiazide (PRINZIDE,ZESTORETIC) 10-12.5 MG tablet TAKE 1 TABLET BY MOUTH DAILY. 90 tablet 0  . metFORMIN (GLUCOPHAGE-XR) 500 MG 24 hr tablet Take 1 tablet (500 mg  total) by mouth daily with breakfast. 30 tablet 0  . Olopatadine HCl 0.2 % SOLN   3  . sildenafil (REVATIO) 20 MG tablet TAKE 2 TO 5 TABLETS BY MOUTH AS NEEDED PRIOR TO SEX. (MAX DOSE PER DAY 100 MG) 100 tablet 0  . sulfamethoxazole-trimethoprim (BACTRIM DS,SEPTRA DS) 800-160 MG tablet Take 1 tablet by mouth 2 (two) times daily. 20 tablet 0   No current facility-administered medications on file prior to visit.    Allergies  Allergen Reactions  . Other     Fruits with a pit. Throat swells up. Can have them if cooked     Recent Results (from the past 2160 hour(s))  WOUND CULTURE     Status: Abnormal   Collection Time: 04/08/18 12:15 PM  Result Value Ref Range   MICRO NUMBER: [id number]    SPECIMEN QUALITY: ADEQUATE    SOURCE: LEFT    STATUS: FINAL    GRAM STAIN:      No white blood cells seen No epithelial cells seen Moderate Gram positive cocci in pairs Moderate Gram negative bacilli   ISOLATE 1: methicillin resistant Staphylococcus aureus (A)     Comment: Heavy growth of Methicillin resistant Staphylococcus aureus (MRSA) Negative for inducible clindamycin resistance.   ISOLATE 2: Pseudomonas aeruginosa (A)     Comment: Heavy growth of Pseudomonas aeruginosa      Susceptibility   Methicillin resistant staphylococcus aureus - AEROBIC CULT, GRAM STAIN POSITIVE 1    VANCOMYCIN 1 Sensitive     CIPROFLOXACIN >=8 Resistant     CLINDAMYCIN <=0.25 Sensitive     LEVOFLOXACIN 4  Resistant     ERYTHROMYCIN >=8 Resistant     GENTAMICIN <=0.5 Sensitive     OXACILLIN* NR Resistant      * Oxacillin-resistant staphylococci are resistant toall currently available beta-lactam antimicrobialagents including penicillins, beta lactam/beta-lactamase inhibitor combinations, and cephems withstaphylococcal indications, including Cefazolin.    TETRACYCLINE <=1 Sensitive     TRIMETH/SULFA <=10 Sensitive    Pseudomonas aeruginosa - AEROBIC CULT, GRAM STAIN NEGATIVE 2    CEFTAZIDIME 4 Sensitive     CEFEPIME 2  Sensitive     IMIPENEM 2 Sensitive     PIP/TAZO 8 Sensitive     TOBRAMYCIN <=1 Sensitive     CIPROFLOXACIN <=0.25 Sensitive     LEVOFLOXACIN 1 Sensitive     GENTAMICIN* <=1 Sensitive      * Oxacillin-resistant staphylococci are resistant toall currently available beta-lactam antimicrobialagents including penicillins, beta lactam/beta-lactamase inhibitor combinations, and cephems withstaphylococcal indications, including Cefazolin.Legend:S = Susceptible  I = IntermediateR = Resistant  NS = Not susceptible* = Not tested  NR = Not reported**NN = See antimicrobic comments    Objective: There were no vitals filed for this visit.  General: Patient is awake, alert, oriented x 3 and in no acute distress.  Dermatology: Skin is warm and dry bilateral with a full thickness ulceration present Right plantar forefoot. Ulceration measures 1.5 cm x 1.5cm x 0.3 cm. There is a keratotic border with a granular base. The ulceration does not probe to bone. There is no malodor, no active drainage, no erythema, no edema. No other acute signs of infection.  There is a full thickness ulceration present Left plantar forefoot. Ulceration measures 1 cm x 1.4cm x 0.3 cm. There is a keratotic border with a granular base. The ulceration does not probe to bone. There is no malodor, no active drainage, no erythema, no edema. No other acute signs of infection.  There is a full thickness ulceration present Left 2nd toe. Ulceration measures 1 cm x 0.8cm x 0.3 cm. There is a keratotic border with a granular base. The ulceration does not probe to bone. There is no malodor, no active drainage, no erythema, + edema to toe. No other acute signs of infection.   Vascular: Dorsalis Pedis pulse = 2/4 Bilateral,  Posterior Tibial pulse = 1/4 Bilateral,  Capillary Fill Time < 5 seconds  Neurologic: Protective sensation absent bilateral.  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 No Pain with palpation to ulcerated areas. No pain with  compression to calves bilateral.   No results for input(s): GRAMSTAIN, LABORGA in the last 8760 hours.  Assessment and Plan:  Problem List Items Addressed This Visit    None    Visit Diagnoses    Ulcers of both lower extremities, limited to breakdown of skin ([place])    -  Primary   Diabetic polyneuropathy associated with type 2 diabetes mellitus ([place])       Charcot foot due to diabetes mellitus ([place])         -Examined patient and discussed the progression of the wound and treatment alternatives. - Excisionally dedbrided ulceration at right and left foot to healthy bleeding borders removing nonviable tissue using a sterile chisel blade. Wound measures post debridement as above.  Wounds were debrided to the level of the dermis with viable wound base exposed to promote healing. Hemostasis was achieved with manuel pressure. Patient tolerated procedure well without any discomfort or anesthesia necessary for this wound debridement.  -Applied iodosorb and dry sterile dressing and instructed patient to  continue with daily dressings at home consisting of same. -Continue post op shoes - Advised patient to go to the ER or return to office if the wound worsens or if constitutional symptoms are present. -Patient to return to office in 2 weeks for follow up care and evaluation or sooner if problems arise. If no improvement will change topical next visit or seek alternate therapy.  Landis Martins, DPM

## 2018-04-22 NOTE — Telephone Encounter (Signed)
Will you call pt and ask him to follow up for diabetes. I got notes from podiatrist. Tight control of sugars will help his feet heal. So will you ask pt to schedule follow up.

## 2018-04-24 NOTE — Telephone Encounter (Signed)
Called pt and let him know what Joshua Branch said. He said he will have to check his schedule and call us back.

## 2018-05-06 ENCOUNTER — Encounter: Payer: Self-pay | Admitting: Sports Medicine

## 2018-05-06 ENCOUNTER — Ambulatory Visit (INDEPENDENT_AMBULATORY_CARE_PROVIDER_SITE_OTHER): Payer: 59 | Admitting: Sports Medicine

## 2018-05-06 DIAGNOSIS — E11610 Type 2 diabetes mellitus with diabetic neuropathic arthropathy: Secondary | ICD-10-CM

## 2018-05-06 DIAGNOSIS — L03032 Cellulitis of left toe: Secondary | ICD-10-CM

## 2018-05-06 DIAGNOSIS — L97911 Non-pressure chronic ulcer of unspecified part of right lower leg limited to breakdown of skin: Secondary | ICD-10-CM | POA: Diagnosis not present

## 2018-05-06 DIAGNOSIS — E1142 Type 2 diabetes mellitus with diabetic polyneuropathy: Secondary | ICD-10-CM

## 2018-05-06 DIAGNOSIS — L97921 Non-pressure chronic ulcer of unspecified part of left lower leg limited to breakdown of skin: Secondary | ICD-10-CM

## 2018-05-06 DIAGNOSIS — L02612 Cutaneous abscess of left foot: Secondary | ICD-10-CM

## 2018-05-06 NOTE — Progress Notes (Signed)
Subjective: Joshua Branch is a 57 y.o. male patient seen in office for follow up evaluation of bilateral foot ulcers.  Patient has a history of diabetes and a blood glucose level today not recorded but "good" per patient and reports that he is having better episodes of keeping it controlled.  Patient is changing the dressing using iodosorb cream daily and reports that he feels like his ulcerations are doing much better and are getting smaller.  Denies nausea/fever/vomiting/chills/night sweats/shortness of breath/pain. Patient has no other pedal complaints at this time.  Patient Active Problem List   Diagnosis Date Noted  . 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 Vertigo 08/20/2016  . Myringotomy tube status 08/20/2016  . Ear fullness, left 08/20/2016  . Asymmetrical left sensorineural hearing loss 02/06/2016  . Neuropathy of left foot 02/03/2015  . Charcot's joint of left foot, non-diabetic 02/03/2015  . 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 Hyperglycemia 07/21/2014  . Cramping of hands 06/02/2014  . Eustachian tube dysfunction 06/02/2014  . HTN (hypertension) 05/07/2014  . Obstructive sleep apnea 05/07/2014   Current Outpatient Medications on File Prior to Visit  Medication Sig Dispense Refill  . amoxicillin-clavulanate (AUGMENTIN) 875-125 MG tablet Take 1 tablet by mouth 2 (two) times daily. (Patient not taking: Reported on 04/15/2018) 28 tablet 0  . cadexomer iodine (IODOSORB) 0.9 % gel Apply 1 application topically daily as needed for wound care. 40 g 0  . cetirizine (ZYRTEC) 10 MG tablet Take 1 tablet (10 mg total) by mouth daily. 30 tablet 11  . ciprofloxacin (CIPRO) 500 MG tablet Take 1 tablet (500 mg total) by mouth 2 (two) times daily. 20 tablet 0  . hydrochlorothiazide (MICROZIDE) 12.5 MG capsule TAKE 1 CAPSULE BY MOUTH DAILY.    Marland Kitchen lisinopril-hydrochlorothiazide (PRINZIDE,ZESTORETIC) 10-12.5 MG tablet TAKE 1 TABLET BY MOUTH DAILY. 90 tablet 0  . metFORMIN  (GLUCOPHAGE-XR) 500 MG 24 hr tablet Take 1 tablet (500 mg total) by mouth daily with breakfast. 30 tablet 0  . Olopatadine HCl 0.2 % SOLN   3  . sildenafil (REVATIO) 20 MG tablet TAKE 2 TO 5 TABLETS BY MOUTH AS NEEDED PRIOR TO SEX. (MAX DOSE PER DAY 100 MG) 100 tablet 0  . sulfamethoxazole-trimethoprim (BACTRIM DS,SEPTRA DS) 800-160 MG tablet Take 1 tablet by mouth 2 (two) times daily. 20 tablet 0   No current facility-administered medications on file prior to visit.    Allergies  Allergen Reactions  . Other     Fruits with a pit. Throat swells up. Can have them if cooked     Recent Results (from the past 2160 hour(s))  WOUND CULTURE     Status: Abnormal   Collection Time: 04/08/18 12:15 PM  Result Value Ref Range   MICRO NUMBER: [id number]    SPECIMEN QUALITY: ADEQUATE    SOURCE: LEFT    STATUS: FINAL    GRAM STAIN:      No white blood cells seen No epithelial cells seen Moderate Gram positive cocci in pairs Moderate Gram negative bacilli   ISOLATE 1: methicillin resistant Staphylococcus aureus (A)     Comment: Heavy growth of Methicillin resistant Staphylococcus aureus (MRSA) Negative for inducible clindamycin resistance.   ISOLATE 2: Pseudomonas aeruginosa (A)     Comment: Heavy growth of Pseudomonas aeruginosa      Susceptibility   Methicillin resistant staphylococcus aureus - AEROBIC CULT, GRAM STAIN POSITIVE 1    VANCOMYCIN 1 Sensitive     CIPROFLOXACIN >=8 Resistant  CLINDAMYCIN <=0.25 Sensitive     LEVOFLOXACIN 4 Resistant     ERYTHROMYCIN >=8 Resistant     GENTAMICIN <=0.5 Sensitive     OXACILLIN* NR Resistant      * Oxacillin-resistant staphylococci are resistant toall currently available beta-lactam antimicrobialagents including penicillins, beta lactam/beta-lactamase inhibitor combinations, and cephems withstaphylococcal indications, including Cefazolin.    TETRACYCLINE <=1 Sensitive     TRIMETH/SULFA <=10 Sensitive    Pseudomonas aeruginosa - AEROBIC CULT, GRAM  STAIN NEGATIVE 2    CEFTAZIDIME 4 Sensitive     CEFEPIME 2 Sensitive     IMIPENEM 2 Sensitive     PIP/TAZO 8 Sensitive     TOBRAMYCIN <=1 Sensitive     CIPROFLOXACIN <=0.25 Sensitive     LEVOFLOXACIN 1 Sensitive     GENTAMICIN* <=1 Sensitive      * Oxacillin-resistant staphylococci are resistant toall currently available beta-lactam antimicrobialagents including penicillins, beta lactam/beta-lactamase inhibitor combinations, and cephems withstaphylococcal indications, including Cefazolin.Legend:S = Susceptible  I = IntermediateR = Resistant  NS = Not susceptible* = Not tested  NR = Not reported**NN = See antimicrobic comments    Objective: There were no vitals filed for this visit.  General: Patient is awake, alert, oriented x 3 and in no acute distress.  Dermatology: Skin is warm and dry bilateral with a full thickness ulceration present Right plantar forefoot. Ulceration measures 1.2 cm x 1.9cm x 0.3 cm. There is a keratotic border with a granular base. The ulceration does not probe to bone. There is no malodor, no active drainage, no erythema, no edema. No other acute signs of infection.  There is a full thickness ulceration present Left plantar forefoot. Ulceration measures 0.9 cm x 1.0cm x 0.3 cm. There is a keratotic border with a granular base. The ulceration does not probe to bone. There is no malodor, no active drainage, no erythema, no edema. No other acute signs of infection.  There is a full thickness ulceration present Left 2nd toe. Ulceration measures 0.8 cm x 0.8cm x 0.3 cm. There is a keratotic border with a granular base. The ulceration does not probe to bone. There is no malodor, no active drainage, no erythema, + edema to toe. No other acute signs of infection.   Vascular: Dorsalis Pedis pulse = 2/4 Bilateral,  Posterior Tibial pulse = 1/4 Bilateral,  Capillary Fill Time < 5 seconds  Neurologic: Protective sensation absent bilateral.  Musculosketal: + Charcot on left 1st  TMTJ. No Pain with palpation to ulcerated areas. No pain with compression to calves bilateral.   No results for input(s): GRAMSTAIN, LABORGA in the last 8760 hours.  Assessment and Plan:  Problem List Items Addressed This Visit    None    Visit Diagnoses    Ulcers of both lower extremities, limited to breakdown of skin ([place])    -  Primary   Diabetic polyneuropathy associated with type 2 diabetes mellitus ([place])       Charcot foot due to diabetes mellitus ([place])       Cellulitis and abscess of toe of left foot         -Examined patient and discussed the progression of the wound and treatment alternatives. - Excisionally dedbrided ulceration at right and left foot to healthy bleeding borders removing nonviable tissue using a sterile chisel blade. Wound measures post debridement as above.  Wounds were debrided to the level of the dermis with viable wound base exposed to promote healing. Hemostasis was achieved with manuel pressure. Patient tolerated  procedure well without any discomfort or anesthesia necessary for this wound debridement.  -Applied iodosorb and dry sterile dressing and instructed patient to continue with daily dressings at home consisting of same. -Continue post op shoes with offloading padding as previous - Advised patient to go to the ER or return to office if the wound worsens or if constitutional symptoms are present. -Patient to return to office in 2 weeks for follow up care and evaluation or sooner if problems arise. If no improvement will change topical next visit to [place].  Landis Martins, DPM

## 2018-05-12 MED FILL — metFORMIN HCL ER 500 MG TB2: 500 | 30 days supply | Qty: 60 | Fill #3

## 2018-05-15 DIAGNOSIS — L97921 Non-pressure chronic ulcer of unspecified part of left lower leg limited to breakdown of skin: Secondary | ICD-10-CM | POA: Diagnosis not present

## 2018-05-15 DIAGNOSIS — L97911 Non-pressure chronic ulcer of unspecified part of right lower leg limited to breakdown of skin: Secondary | ICD-10-CM | POA: Diagnosis not present

## 2018-05-20 ENCOUNTER — Ambulatory Visit (INDEPENDENT_AMBULATORY_CARE_PROVIDER_SITE_OTHER): Payer: 59 | Admitting: Sports Medicine

## 2018-05-20 ENCOUNTER — Encounter: Payer: Self-pay | Admitting: Sports Medicine

## 2018-05-20 DIAGNOSIS — E1142 Type 2 diabetes mellitus with diabetic polyneuropathy: Secondary | ICD-10-CM

## 2018-05-20 DIAGNOSIS — L97911 Non-pressure chronic ulcer of unspecified part of right lower leg limited to breakdown of skin: Secondary | ICD-10-CM | POA: Diagnosis not present

## 2018-05-20 DIAGNOSIS — L97921 Non-pressure chronic ulcer of unspecified part of left lower leg limited to breakdown of skin: Secondary | ICD-10-CM

## 2018-05-20 DIAGNOSIS — E11610 Type 2 diabetes mellitus with diabetic neuropathic arthropathy: Secondary | ICD-10-CM

## 2018-05-20 NOTE — Progress Notes (Signed)
Subjective: Joshua Branch is a 57 y.o. male patient seen in office for follow up evaluation of bilateral foot ulcers.  Patient has a history of diabetes and a blood glucose level today not recorded. Last A1c 11.03 January 2018.  Patient is changing the dressing using iodosorb cream daily and reports that he feels like his ulcerations are doing much better and are getting smaller like previous but questions why the right ulcer still has depth.  Denies nausea/fever/vomiting/chills/night sweats/shortness of breath/pain. Patient has no other pedal complaints at this time.  Patient Active Problem List   Diagnosis Date Noted  . 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 Vertigo 08/20/2016  . Myringotomy tube status 08/20/2016  . Ear fullness, left 08/20/2016  . Asymmetrical left sensorineural hearing loss 02/06/2016  . Neuropathy of left foot 02/03/2015  . Charcot's joint of left foot, non-diabetic 02/03/2015  . 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 Hyperglycemia 07/21/2014  . Cramping of hands 06/02/2014  . Eustachian tube dysfunction 06/02/2014  . HTN (hypertension) 05/07/2014  . Obstructive sleep apnea 05/07/2014   Current Outpatient Medications on File Prior to Visit  Medication Sig Dispense Refill  . amoxicillin-clavulanate (AUGMENTIN) 875-125 MG tablet Take 1 tablet by mouth 2 (two) times daily. (Patient not taking: Reported on 04/15/2018) 28 tablet 0  . cadexomer iodine (IODOSORB) 0.9 % gel Apply 1 application topically daily as needed for wound care. 40 g 0  . cetirizine (ZYRTEC) 10 MG tablet Take 1 tablet (10 mg total) by mouth daily. 30 tablet 11  . ciprofloxacin (CIPRO) 500 MG tablet Take 1 tablet (500 mg total) by mouth 2 (two) times daily. 20 tablet 0  . hydrochlorothiazide (MICROZIDE) 12.5 MG capsule TAKE 1 CAPSULE BY MOUTH DAILY.    Marland Kitchen lisinopril-hydrochlorothiazide (PRINZIDE,ZESTORETIC) 10-12.5 MG tablet TAKE 1 TABLET BY MOUTH DAILY. 90 tablet 0  . metFORMIN  (GLUCOPHAGE-XR) 500 MG 24 hr tablet Take 1 tablet (500 mg total) by mouth daily with breakfast. 30 tablet 0  . Olopatadine HCl 0.2 % SOLN   3  . sildenafil (REVATIO) 20 MG tablet TAKE 2 TO 5 TABLETS BY MOUTH AS NEEDED PRIOR TO SEX. (MAX DOSE PER DAY 100 MG) 100 tablet 0  . sulfamethoxazole-trimethoprim (BACTRIM DS,SEPTRA DS) 800-160 MG tablet Take 1 tablet by mouth 2 (two) times daily. 20 tablet 0   No current facility-administered medications on file prior to visit.    Allergies  Allergen Reactions  . Other     Fruits with a pit. Throat swells up. Can have them if cooked     Recent Results (from the past 2160 hour(s))  WOUND CULTURE     Status: Abnormal   Collection Time: 04/08/18 12:15 PM  Result Value Ref Range   MICRO NUMBER: [id number]    SPECIMEN QUALITY: ADEQUATE    SOURCE: LEFT    STATUS: FINAL    GRAM STAIN:      No white blood cells seen No epithelial cells seen Moderate Gram positive cocci in pairs Moderate Gram negative bacilli   ISOLATE 1: methicillin resistant Staphylococcus aureus (A)     Comment: Heavy growth of Methicillin resistant Staphylococcus aureus (MRSA) Negative for inducible clindamycin resistance.   ISOLATE 2: Pseudomonas aeruginosa (A)     Comment: Heavy growth of Pseudomonas aeruginosa      Susceptibility   Methicillin resistant staphylococcus aureus - AEROBIC CULT, GRAM STAIN POSITIVE 1    VANCOMYCIN 1 Sensitive     CIPROFLOXACIN >=8 Resistant  CLINDAMYCIN <=0.25 Sensitive     LEVOFLOXACIN 4 Resistant     ERYTHROMYCIN >=8 Resistant     GENTAMICIN <=0.5 Sensitive     OXACILLIN* NR Resistant      * Oxacillin-resistant staphylococci are resistant toall currently available beta-lactam antimicrobialagents including penicillins, beta lactam/beta-lactamase inhibitor combinations, and cephems withstaphylococcal indications, including Cefazolin.    TETRACYCLINE <=1 Sensitive     TRIMETH/SULFA <=10 Sensitive    Pseudomonas aeruginosa - AEROBIC CULT, GRAM  STAIN NEGATIVE 2    CEFTAZIDIME 4 Sensitive     CEFEPIME 2 Sensitive     IMIPENEM 2 Sensitive     PIP/TAZO 8 Sensitive     TOBRAMYCIN <=1 Sensitive     CIPROFLOXACIN <=0.25 Sensitive     LEVOFLOXACIN 1 Sensitive     GENTAMICIN* <=1 Sensitive      * Oxacillin-resistant staphylococci are resistant toall currently available beta-lactam antimicrobialagents including penicillins, beta lactam/beta-lactamase inhibitor combinations, and cephems withstaphylococcal indications, including Cefazolin.Legend:S = Susceptible  I = IntermediateR = Resistant  NS = Not susceptible* = Not tested  NR = Not reported**NN = See antimicrobic comments  WOUND CULTURE     Status: Abnormal   Collection Time: 04/08/18 12:15 PM  Result Value Ref Range   MICRO NUMBER: [id number]    SPECIMEN QUALITY: ADEQUATE    SOURCE: RIGHT FOOT SUBMET    STATUS: FINAL    GRAM STAIN: Gram positive cocci in chains     Comment: Rare Polymorphonuclear leukocytes Rare epithelial cells Moderate Gram positive cocci in clusters Few Gram positive cocci in chains   ISOLATE 1: methicillin resistant Staphylococcus aureus (A)     Comment: Heavy growth of Methicillin resistant Staphylococcus aureus (MRSA) Negative for inducible clindamycin resistance.   ISOLATE 2: Streptococcus agalactiae (A)     Comment: Moderate growth of Group B Streptococcus isolated Beta-hemolytic Streptococci are predictably susceptible to penicillin and other beta-lactams. Susceptibility testing not routinely performed.      Susceptibility   Methicillin resistant staphylococcus aureus - AEROBIC CULT, GRAM STAIN POSITIVE 1    VANCOMYCIN 1 Sensitive     CIPROFLOXACIN >=8 Resistant     CLINDAMYCIN <=0.25 Sensitive     LEVOFLOXACIN 4 Resistant     ERYTHROMYCIN >=8 Resistant     GENTAMICIN <=0.5 Sensitive     OXACILLIN* NR Resistant      * Oxacillin-resistant staphylococci are resistant toall currently available beta-lactam antimicrobialagents including penicillins, beta  lactam/beta-lactamase inhibitor combinations, and cephems withstaphylococcal indications, including Cefazolin.    TETRACYCLINE <=1 Sensitive     TRIMETH/SULFA* <=10 Sensitive      * Oxacillin-resistant staphylococci are resistant toall currently available beta-lactam antimicrobialagents including penicillins, beta lactam/beta-lactamase inhibitor combinations, and cephems withstaphylococcal indications, including Cefazolin.Legend:S = Susceptible  I = IntermediateR = Resistant  NS = Not susceptible* = Not tested  NR = Not reported**NN = See antimicrobic comments    Objective: There were no vitals filed for this visit.  General: Patient is awake, alert, oriented x 3 and in no acute distress.  Dermatology: Skin is warm and dry bilateral with a full thickness ulceration present Right plantar forefoot. Ulceration measures 1.0 cm x 1.6cm x 0.3 cm. There is a keratotic border with a granular base. The ulceration does not probe to bone. There is no malodor, no active drainage, no erythema, no edema. No other acute signs of infection.  There is a full thickness ulceration present Left plantar forefoot sub met 1. Ulceration measures 0.3 cm x 0.3cm x 0.2 cm. There is a  keratotic border with a granular base. The ulceration does not probe to bone. There is no malodor, no active drainage, no erythema, no edema. No other acute signs of infection.  There is a full thickness ulceration present Left 2nd toe. Ulceration measures 0.5 cm x 0.5cm x 0.2 cm. There is a keratotic border with a granular base. The ulceration does not probe to bone. There is no malodor, no active drainage, no erythema, + edema to toe. No other acute signs of infection.   Vascular: Dorsalis Pedis pulse = 2/4 Bilateral,  Posterior Tibial pulse = 1/4 Bilateral,  Capillary Fill Time < 5 seconds  Neurologic: Protective sensation absent bilateral.  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 No Pain with palpation to ulcerated areas. No pain with  compression to calves bilateral.   No results for input(s): GRAMSTAIN, LABORGA in the last 8760 hours.  Assessment and Plan:  Problem List Items Addressed This Visit    None    Visit Diagnoses    Ulcers of both lower extremities, limited to breakdown of skin ([place])    -  Primary   Diabetic polyneuropathy associated with type 2 diabetes mellitus ([place])       Charcot foot due to diabetes mellitus ([place])         -Examined patient and discussed the progression of the wound and treatment alternatives. - Excisionally dedbrided ulceration at right and left foot to healthy bleeding borders removing nonviable tissue using a sterile chisel blade. Wound measures post debridement as above.  Wounds were debrided to the level of the dermis with viable wound base exposed to promote healing. Hemostasis was achieved with manuel pressure. Patient tolerated procedure well without any discomfort or anesthesia necessary for this wound debridement.  -Applied iodosorb and dry sterile dressing to left and PRISMA to right and instructed patient to continue with daily dressings at home consisting of same. -Continue post op shoes with offloading padding as previous - Advised patient to go to the ER or return to office if the wound worsens or if constitutional symptoms are present. -Patient to return to office in 2-3 weeks for follow up care and evaluation or sooner if problems arise. Landis Martins, DPM

## 2018-05-29 ENCOUNTER — Other Ambulatory Visit: Payer: Self-pay

## 2018-05-29 DIAGNOSIS — L728 Other follicular cysts of the skin and subcutaneous tissue: Secondary | ICD-10-CM | POA: Diagnosis not present

## 2018-05-29 DIAGNOSIS — D485 Neoplasm of uncertain behavior of skin: Secondary | ICD-10-CM | POA: Diagnosis not present

## 2018-05-29 DIAGNOSIS — L570 Actinic keratosis: Secondary | ICD-10-CM | POA: Diagnosis not present

## 2018-05-29 DIAGNOSIS — D229 Melanocytic nevi, unspecified: Secondary | ICD-10-CM | POA: Diagnosis not present

## 2018-06-06 ENCOUNTER — Other Ambulatory Visit: Payer: Self-pay | Admitting: Medical

## 2018-06-06 MED FILL — SILDENAFIL CITRATE 20 MG TA: 20 | 20 days supply | Qty: 100 | Fill #0

## 2018-06-06 MED FILL — LISINOPRIL-HCTZ 10-12.5 TAB: 10-12.5 | 90 days supply | Qty: 90 | Fill #0

## 2018-06-06 MED FILL — metFORMIN HCL ER 500 MG TB2: 500 | 30 days supply | Qty: 60 | Fill #0

## 2018-06-10 ENCOUNTER — Ambulatory Visit (INDEPENDENT_AMBULATORY_CARE_PROVIDER_SITE_OTHER): Payer: 59 | Admitting: Sports Medicine

## 2018-06-10 ENCOUNTER — Encounter: Payer: Self-pay | Admitting: Sports Medicine

## 2018-06-10 DIAGNOSIS — L97921 Non-pressure chronic ulcer of unspecified part of left lower leg limited to breakdown of skin: Secondary | ICD-10-CM | POA: Diagnosis not present

## 2018-06-10 DIAGNOSIS — E1142 Type 2 diabetes mellitus with diabetic polyneuropathy: Secondary | ICD-10-CM

## 2018-06-10 DIAGNOSIS — E11610 Type 2 diabetes mellitus with diabetic neuropathic arthropathy: Secondary | ICD-10-CM

## 2018-06-10 DIAGNOSIS — L97911 Non-pressure chronic ulcer of unspecified part of right lower leg limited to breakdown of skin: Secondary | ICD-10-CM | POA: Diagnosis not present

## 2018-06-10 NOTE — Progress Notes (Signed)
Subjective: Joshua Branch is a 57 y.o. male patient seen in office for follow up evaluation of bilateral foot ulcers.  Patient has a history of diabetes and a blood glucose level today not recorded. Last A1c 11.7 as previous.  Patient is changing the dressing using iodosorb cream daily on left and PRISMA on right and reports that he feels like his ulcerations are doing much better and are getting smaller like previous. No other issues noted. Denies nausea/fever/vomiting/chills/night sweats/shortness of breath/pain.  Patient Active Problem List   Diagnosis Date Noted  . 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 Vertigo 08/20/2016  . Myringotomy tube status 08/20/2016  . Ear fullness, left 08/20/2016  . Asymmetrical left sensorineural hearing loss 02/06/2016  . Neuropathy of left foot 02/03/2015  . Charcot's joint of left foot, non-diabetic 02/03/2015  . 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 Hyperglycemia 07/21/2014  . Cramping of hands 06/02/2014  . Eustachian tube dysfunction 06/02/2014  . HTN (hypertension) 05/07/2014  . Obstructive sleep apnea 05/07/2014   Current Outpatient Medications on File Prior to Visit  Medication Sig Dispense Refill  . amoxicillin-clavulanate (AUGMENTIN) 875-125 MG tablet Take 1 tablet by mouth 2 (two) times daily. 28 tablet 0  . cadexomer iodine (IODOSORB) 0.9 % gel Apply 1 application topically daily as needed for wound care. 40 g 0  . cetirizine (ZYRTEC) 10 MG tablet Take 1 tablet (10 mg total) by mouth daily. 30 tablet 11  . ciprofloxacin (CIPRO) 500 MG tablet Take 1 tablet (500 mg total) by mouth 2 (two) times daily. 20 tablet 0  . hydrochlorothiazide (MICROZIDE) 12.5 MG capsule TAKE 1 CAPSULE BY MOUTH DAILY.    Marland Kitchen lisinopril-hydrochlorothiazide (PRINZIDE,ZESTORETIC) 10-12.5 MG tablet TAKE 1 TABLET BY MOUTH DAILY. 90 tablet 0  . metFORMIN (GLUCOPHAGE-XR) 500 MG 24 hr tablet TAKE 2 TABLETS BY MOUTH ONCE DAILY WITH BREAKFAST 60 tablet 3  .  Olopatadine HCl 0.2 % SOLN   3  . sildenafil (REVATIO) 20 MG tablet TAKE 2 TO 5 TABLETS BY MOUTH AS NEEDED PRIOR TO SEX. (MAX DOSE PER DAY 100 MG) 100 tablet 0  . sulfamethoxazole-trimethoprim (BACTRIM DS,SEPTRA DS) 800-160 MG tablet Take 1 tablet by mouth 2 (two) times daily. 20 tablet 0   No current facility-administered medications on file prior to visit.    Allergies  Allergen Reactions  . Other     Fruits with a pit. Throat swells up. Can have them if cooked     Recent Results (from the past 2160 hour(s))  WOUND CULTURE     Status: Abnormal   Collection Time: 04/08/18 12:15 PM  Result Value Ref Range   MICRO NUMBER: [id number]    SPECIMEN QUALITY: ADEQUATE    SOURCE: LEFT    STATUS: FINAL    GRAM STAIN:      No white blood cells seen No epithelial cells seen Moderate Gram positive cocci in pairs Moderate Gram negative bacilli   ISOLATE 1: methicillin resistant Staphylococcus aureus (A)     Comment: Heavy growth of Methicillin resistant Staphylococcus aureus (MRSA) Negative for inducible clindamycin resistance.   ISOLATE 2: Pseudomonas aeruginosa (A)     Comment: Heavy growth of Pseudomonas aeruginosa      Susceptibility   Methicillin resistant staphylococcus aureus - AEROBIC CULT, GRAM STAIN POSITIVE 1    VANCOMYCIN 1 Sensitive     CIPROFLOXACIN >=8 Resistant     CLINDAMYCIN <=0.25 Sensitive     LEVOFLOXACIN 4 Resistant     ERYTHROMYCIN >=8  Resistant     GENTAMICIN <=0.5 Sensitive     OXACILLIN* NR Resistant      * Oxacillin-resistant staphylococci are resistant toall currently available beta-lactam antimicrobialagents including penicillins, beta lactam/beta-lactamase inhibitor combinations, and cephems withstaphylococcal indications, including Cefazolin.    TETRACYCLINE <=1 Sensitive     TRIMETH/SULFA <=10 Sensitive    Pseudomonas aeruginosa - AEROBIC CULT, GRAM STAIN NEGATIVE 2    CEFTAZIDIME 4 Sensitive     CEFEPIME 2 Sensitive     IMIPENEM 2 Sensitive     PIP/TAZO 8  Sensitive     TOBRAMYCIN <=1 Sensitive     CIPROFLOXACIN <=0.25 Sensitive     LEVOFLOXACIN 1 Sensitive     GENTAMICIN* <=1 Sensitive      * Oxacillin-resistant staphylococci are resistant toall currently available beta-lactam antimicrobialagents including penicillins, beta lactam/beta-lactamase inhibitor combinations, and cephems withstaphylococcal indications, including Cefazolin.Legend:S = Susceptible  I = IntermediateR = Resistant  NS = Not susceptible* = Not tested  NR = Not reported**NN = See antimicrobic comments  WOUND CULTURE     Status: Abnormal   Collection Time: 04/08/18 12:15 PM  Result Value Ref Range   MICRO NUMBER: [id number]    SPECIMEN QUALITY: ADEQUATE    SOURCE: RIGHT FOOT SUBMET    STATUS: FINAL    GRAM STAIN: Gram positive cocci in chains     Comment: Rare Polymorphonuclear leukocytes Rare epithelial cells Moderate Gram positive cocci in clusters Few Gram positive cocci in chains   ISOLATE 1: methicillin resistant Staphylococcus aureus (A)     Comment: Heavy growth of Methicillin resistant Staphylococcus aureus (MRSA) Negative for inducible clindamycin resistance.   ISOLATE 2: Streptococcus agalactiae (A)     Comment: Moderate growth of Group B Streptococcus isolated Beta-hemolytic Streptococci are predictably susceptible to penicillin and other beta-lactams. Susceptibility testing not routinely performed.      Susceptibility   Methicillin resistant staphylococcus aureus - AEROBIC CULT, GRAM STAIN POSITIVE 1    VANCOMYCIN 1 Sensitive     CIPROFLOXACIN >=8 Resistant     CLINDAMYCIN <=0.25 Sensitive     LEVOFLOXACIN 4 Resistant     ERYTHROMYCIN >=8 Resistant     GENTAMICIN <=0.5 Sensitive     OXACILLIN* NR Resistant      * Oxacillin-resistant staphylococci are resistant toall currently available beta-lactam antimicrobialagents including penicillins, beta lactam/beta-lactamase inhibitor combinations, and cephems withstaphylococcal indications, including Cefazolin.     TETRACYCLINE <=1 Sensitive     TRIMETH/SULFA* <=10 Sensitive      * Oxacillin-resistant staphylococci are resistant toall currently available beta-lactam antimicrobialagents including penicillins, beta lactam/beta-lactamase inhibitor combinations, and cephems withstaphylococcal indications, including Cefazolin.Legend:S = Susceptible  I = IntermediateR = Resistant  NS = Not susceptible* = Not tested  NR = Not reported**NN = See antimicrobic comments    Objective: There were no vitals filed for this visit.  General: Patient is awake, alert, oriented x 3 and in no acute distress.  Dermatology: Skin is warm and dry bilateral with a full thickness ulceration present Right plantar forefoot. Ulceration measures 1.0 cm x 1.5cm x 0.3 cm. There is a keratotic border with a granular base. The ulceration does not probe to bone. There is no malodor, no active drainage, no erythema, no edema. No other acute signs of infection.  There is a full thickness ulceration present Left plantar forefoot sub met 1. Ulceration is prematurely healed now an abrasion. There is no malodor, no active drainage, no erythema, no edema. No other acute signs of infection.  There is a  full thickness ulceration present Left 2nd toe. Ulceration measures 0.2 cm x 0.2cm x 0.2 cm. There is a keratotic border with a granular base. The ulceration does not probe to bone. There is no malodor, no active drainage, no erythema, + edema to toe unchanged from pior. No other acute signs of infection.   Vascular: Dorsalis Pedis pulse = 2/4 Bilateral,  Posterior Tibial pulse = 1/4 Bilateral,  Capillary Fill Time < 5 seconds  Neurologic: Protective sensation absent bilateral.  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 No Pain with palpation to ulcerated areas. No pain with compression to calves bilateral.   No results for input(s): GRAMSTAIN, LABORGA in the last 8760 hours.  Assessment and Plan:  Problem List Items Addressed This Visit    None     Visit Diagnoses    Ulcers of both lower extremities, limited to breakdown of skin ([place])    -  Primary   Diabetic polyneuropathy associated with type 2 diabetes mellitus ([place])       Charcot foot due to diabetes mellitus ([place])         -Examined patient and discussed the progression of the wound and treatment alternatives. - Excisionally dedbrided ulceration at right and left foot to healthy bleeding borders removing nonviable tissue using a sterile chisel blade. Wound measures post debridement as above.  Wounds were debrided to the level of the dermis with viable wound base exposed to promote healing. Hemostasis was achieved with manuel pressure. Patient tolerated procedure well without any discomfort or anesthesia necessary for this wound debridement.  -Applied PRISMA to right and left foot and instructed patient to continue with daily dressings at home consisting of same. -Continue post op shoes with offloading padding as previous but may transition to tennis shoe on left since post op shoe is hurting his back - Advised patient to go to the ER or return to office if the wound worsens or if constitutional symptoms are present. -Patient to return to office in 3 weeks for follow up care and evaluation or sooner if problems arise. Landis Martins, DPM

## 2018-06-12 ENCOUNTER — Telehealth: Payer: Self-pay | Admitting: Medical

## 2018-06-12 NOTE — Telephone Encounter (Signed)
I offered/recommended follow up to check on sugars. I keep reviewing note of podiatrist. Sugar control very important in overall treatment for feet. He declined last attempt to get him in. Will he come in to see me. Or does he want to see endocrinologist instead. Let me know what he says.

## 2018-06-13 NOTE — Telephone Encounter (Signed)
Called and notified pt of message below. Pt states he will call back and schedule.

## 2018-06-16 NOTE — Telephone Encounter (Signed)
Will you call patient and let him know that I want to refer him to endocrinologist. I do thinks it would be best for him to see specialist. I had wanted him to follow up 2 months ago with me for diabetes. With his recent ulcers in both feet important to control blood sugars.   So let me know what he says. He has not made appointment with me as I requested. If he denies referral let me know. Will need to discuss with Dr. Etter Sjogren or Dr. Charlett Blake.

## 2018-06-17 ENCOUNTER — Telehealth: Payer: Self-pay | Admitting: Medical

## 2018-06-17 DIAGNOSIS — E1169 Type 2 diabetes mellitus with other specified complication: Secondary | ICD-10-CM

## 2018-06-17 DIAGNOSIS — Z794 Long term (current) use of insulin: Principal | ICD-10-CM

## 2018-06-17 NOTE — Telephone Encounter (Signed)
Referral to endocrinologist placed. 

## 2018-06-17 NOTE — Telephone Encounter (Signed)
Pt states he is agreeable with referral.

## 2018-06-22 ENCOUNTER — Encounter (HOSPITAL_COMMUNITY): Payer: Self-pay | Admitting: Cardiology

## 2018-06-22 ENCOUNTER — Other Ambulatory Visit: Payer: Self-pay

## 2018-06-22 ENCOUNTER — Encounter (HOSPITAL_COMMUNITY)
Admission: EM | Disposition: A | Discharge status: Home / Self Care | Payer: Self-pay | Source: Home / Self Care | Attending: Thoracic Surgery (Cardiothoracic Vascular Surgery)

## 2018-06-22 ENCOUNTER — Emergency Department (HOSPITAL_BASED_OUTPATIENT_CLINIC_OR_DEPARTMENT_OTHER): Payer: 59

## 2018-06-22 ENCOUNTER — Inpatient Hospital Stay (HOSPITAL_COMMUNITY): Payer: 59

## 2018-06-22 ENCOUNTER — Inpatient Hospital Stay (HOSPITAL_BASED_OUTPATIENT_CLINIC_OR_DEPARTMENT_OTHER)
Admission: EM | Admit: 2018-06-22 | Discharge: 2018-06-29 | DRG: 234 | Disposition: A | Discharge status: Home / Self Care | Payer: 59 | Attending: Thoracic Surgery (Cardiothoracic Vascular Surgery) | Admitting: Thoracic Surgery (Cardiothoracic Vascular Surgery)

## 2018-06-22 DIAGNOSIS — E1141 Type 2 diabetes mellitus with diabetic mononeuropathy: Secondary | ICD-10-CM | POA: Diagnosis present

## 2018-06-22 DIAGNOSIS — Z01810 Encounter for preprocedural cardiovascular examination: Secondary | ICD-10-CM | POA: Diagnosis not present

## 2018-06-22 DIAGNOSIS — E8770 Fluid overload, unspecified: Secondary | ICD-10-CM | POA: Diagnosis not present

## 2018-06-22 DIAGNOSIS — J90 Pleural effusion, not elsewhere classified: Secondary | ICD-10-CM | POA: Diagnosis not present

## 2018-06-22 DIAGNOSIS — R7989 Other specified abnormal findings of blood chemistry: Secondary | ICD-10-CM

## 2018-06-22 DIAGNOSIS — I080 Rheumatic disorders of both mitral and aortic valves: Secondary | ICD-10-CM | POA: Diagnosis not present

## 2018-06-22 DIAGNOSIS — Z794 Long term (current) use of insulin: Secondary | ICD-10-CM

## 2018-06-22 DIAGNOSIS — I219 Acute myocardial infarction, unspecified: Secondary | ICD-10-CM | POA: Diagnosis present

## 2018-06-22 DIAGNOSIS — Z7984 Long term (current) use of oral hypoglycemic drugs: Secondary | ICD-10-CM | POA: Diagnosis not present

## 2018-06-22 DIAGNOSIS — I10 Essential (primary) hypertension: Secondary | ICD-10-CM | POA: Diagnosis not present

## 2018-06-22 DIAGNOSIS — R778 Other specified abnormalities of plasma proteins: Secondary | ICD-10-CM

## 2018-06-22 DIAGNOSIS — E1165 Type 2 diabetes mellitus with hyperglycemia: Secondary | ICD-10-CM | POA: Diagnosis present

## 2018-06-22 DIAGNOSIS — J9811 Atelectasis: Secondary | ICD-10-CM

## 2018-06-22 DIAGNOSIS — I213 ST elevation (STEMI) myocardial infarction of unspecified site: Secondary | ICD-10-CM | POA: Diagnosis not present

## 2018-06-22 DIAGNOSIS — K219 Gastro-esophageal reflux disease without esophagitis: Secondary | ICD-10-CM | POA: Diagnosis present

## 2018-06-22 DIAGNOSIS — I25110 Atherosclerotic heart disease of native coronary artery with unstable angina pectoris: Secondary | ICD-10-CM | POA: Diagnosis present

## 2018-06-22 DIAGNOSIS — I214 Non-ST elevation (NSTEMI) myocardial infarction: Secondary | ICD-10-CM

## 2018-06-22 DIAGNOSIS — G4733 Obstructive sleep apnea (adult) (pediatric): Secondary | ICD-10-CM | POA: Diagnosis present

## 2018-06-22 DIAGNOSIS — I2109 ST elevation (STEMI) myocardial infarction involving other coronary artery of anterior wall: Secondary | ICD-10-CM | POA: Diagnosis present

## 2018-06-22 DIAGNOSIS — Z79899 Other long term (current) drug therapy: Secondary | ICD-10-CM | POA: Diagnosis not present

## 2018-06-22 DIAGNOSIS — E119 Type 2 diabetes mellitus without complications: Secondary | ICD-10-CM | POA: Diagnosis not present

## 2018-06-22 DIAGNOSIS — I259 Chronic ischemic heart disease, unspecified: Secondary | ICD-10-CM

## 2018-06-22 DIAGNOSIS — L97519 Non-pressure chronic ulcer of other part of right foot with unspecified severity: Secondary | ICD-10-CM | POA: Diagnosis present

## 2018-06-22 DIAGNOSIS — R079 Chest pain, unspecified: Secondary | ICD-10-CM | POA: Diagnosis not present

## 2018-06-22 DIAGNOSIS — E876 Hypokalemia: Secondary | ICD-10-CM | POA: Diagnosis not present

## 2018-06-22 DIAGNOSIS — I249 Acute ischemic heart disease, unspecified: Secondary | ICD-10-CM | POA: Diagnosis present

## 2018-06-22 DIAGNOSIS — I2510 Atherosclerotic heart disease of native coronary artery without angina pectoris: Secondary | ICD-10-CM | POA: Diagnosis not present

## 2018-06-22 DIAGNOSIS — E785 Hyperlipidemia, unspecified: Secondary | ICD-10-CM | POA: Diagnosis not present

## 2018-06-22 DIAGNOSIS — R739 Hyperglycemia, unspecified: Secondary | ICD-10-CM | POA: Diagnosis present

## 2018-06-22 DIAGNOSIS — R000 Tachycardia, unspecified: Secondary | ICD-10-CM | POA: Diagnosis present

## 2018-06-22 DIAGNOSIS — Z951 Presence of aortocoronary bypass graft: Secondary | ICD-10-CM | POA: Diagnosis not present

## 2018-06-22 DIAGNOSIS — L97529 Non-pressure chronic ulcer of other part of left foot with unspecified severity: Secondary | ICD-10-CM | POA: Diagnosis present

## 2018-06-22 DIAGNOSIS — L98499 Non-pressure chronic ulcer of skin of other sites with unspecified severity: Secondary | ICD-10-CM | POA: Diagnosis present

## 2018-06-22 DIAGNOSIS — J9809 Other diseases of bronchus, not elsewhere classified: Secondary | ICD-10-CM | POA: Diagnosis not present

## 2018-06-22 DIAGNOSIS — R509 Fever, unspecified: Secondary | ICD-10-CM

## 2018-06-22 HISTORY — DX: Atherosclerotic heart disease of native coronary artery with unstable angina pectoris: I25.110

## 2018-06-22 HISTORY — DX: Presence of aortocoronary bypass graft: Z95.1

## 2018-06-22 HISTORY — DX: Reserved for concepts with insufficient information to code with codable children: IMO0002

## 2018-06-22 HISTORY — DX: Acute myocardial infarction, unspecified: I21.9

## 2018-06-22 HISTORY — DX: Type 2 diabetes mellitus with hyperglycemia: E11.65

## 2018-06-22 HISTORY — DX: Non-pressure chronic ulcer of other part of right foot with unspecified severity: L97.519

## 2018-06-22 HISTORY — PX: LEFT HEART CATH AND CORONARY ANGIOGRAPHY: CATH118249

## 2018-06-22 HISTORY — DX: Hyperlipidemia, unspecified: E78.5

## 2018-06-22 HISTORY — DX: Type 2 diabetes mellitus with unspecified complications: E11.8

## 2018-06-22 LAB — BASIC METABOLIC PANEL
Anion gap: 10 (ref 5–15)
BUN: 17 mg/dL (ref 6–20)
CALCIUM: 9.1 mg/dL (ref 8.9–10.3)
CO2: 27 mmol/L (ref 22–32)
CREATININE: 0.97 mg/dL (ref 0.61–1.24)
Chloride: 92 mmol/L — ABNORMAL LOW (ref 98–111)
GFR calc non Af Amer: >60 mL/min (ref >60)
Glucose, Bld: 295 mg/dL — ABNORMAL HIGH (ref 70–99)
Potassium: 4 mmol/L (ref 3.5–5.1)
SODIUM: 129 mmol/L — AB (ref 135–145)

## 2018-06-22 LAB — CBC
HCT: 48.3 % (ref 39.0–52.0)
Hemoglobin: 16.1 g/dL (ref 13.0–17.0)
MCH: 30.1 pg (ref 26.0–34.0)
MCHC: 33.3 g/dL (ref 30.0–36.0)
MCV: 90.3 fL (ref 80.0–100.0)
NRBC: 0 % (ref 0.0–0.2)
PLATELETS: 258 10*3/uL (ref 150–400)
RBC: 5.35 MIL/uL (ref 4.22–5.81)
RDW: 11.4 % — ABNORMAL LOW (ref 11.5–15.5)
WBC: 15.4 10*3/uL — ABNORMAL HIGH (ref 4.0–10.5)

## 2018-06-22 LAB — TROPONIN I
TROPONIN I: 12.6 ng/mL — AB (ref <0.03)
TROPONIN I: 9.09 ng/mL — AB (ref <0.03)

## 2018-06-22 LAB — ECHOCARDIOGRAM COMPLETE
HEIGHTINCHES: 74.5 in
Weight: 3858.93 oz

## 2018-06-22 LAB — GLUCOSE, CAPILLARY
GLUCOSE-CAPILLARY: 196 mg/dL — AB (ref 70–99)
Glucose-Capillary: 259 mg/dL — ABNORMAL HIGH (ref 70–99)

## 2018-06-22 LAB — MRSA PCR SCREENING: MRSA by PCR: POSITIVE — AB

## 2018-06-22 SURGERY — LEFT HEART CATH AND CORONARY ANGIOGRAPHY
Anesthesia: LOCAL

## 2018-06-22 MED ORDER — METOPROLOL TARTRATE 50 MG PO TABS
25.0000 mg | ORAL_TABLET | Freq: Once | ORAL | Status: AC
  Administered 2018-06-22: 25 mg via ORAL
  Filled 2018-06-22: qty 1

## 2018-06-22 MED ORDER — HEPARIN (PORCINE) 25000 UT/250ML-% IV SOLN
1300.0000 [IU]/h | INTRAVENOUS | Status: DC
  Administered 2018-06-22: 1300 [IU]/h via INTRAVENOUS
  Filled 2018-06-22: qty 250

## 2018-06-22 MED ORDER — ONDANSETRON HCL 4 MG/2ML IJ SOLN: 4.0000 mg | Freq: Four times a day (QID) | INTRAMUSCULAR | Status: DC | PRN

## 2018-06-22 MED ORDER — ASPIRIN 81 MG PO CHEW
324.0000 mg | CHEWABLE_TABLET | Freq: Once | ORAL | Status: AC
  Administered 2018-06-22: 324 mg via ORAL
  Filled 2018-06-22: qty 4

## 2018-06-22 MED ORDER — MORPHINE SULFATE (PF) 4 MG/ML IV SOLN
4.0000 mg | Freq: Once | INTRAVENOUS | Status: AC
  Administered 2018-06-22: 4 mg via INTRAVENOUS
  Filled 2018-06-22: qty 1

## 2018-06-22 MED ORDER — LIDOCAINE HCL (PF) 1 % IJ SOLN
INTRAMUSCULAR | Status: DC | PRN
  Administered 2018-06-22: 2 mL

## 2018-06-22 MED ORDER — MECLIZINE HCL 12.5 MG PO TABS: 25.0000 mg | ORAL_TABLET | Freq: Three times a day (TID) | ORAL | Status: DC | PRN

## 2018-06-22 MED ORDER — NITROGLYCERIN IN D5W 200-5 MCG/ML-% IV SOLN
0.0000 ug/min | INTRAVENOUS | Status: DC
  Administered 2018-06-22: 5 ug/min via INTRAVENOUS
  Administered 2018-06-23: 30 ug/min via INTRAVENOUS
  Filled 2018-06-22 (×3): qty 250

## 2018-06-22 MED ORDER — HEPARIN SODIUM (PORCINE) 1000 UNIT/ML IJ SOLN
INTRAMUSCULAR | Status: DC | PRN
  Administered 2018-06-22: 6000 [IU] via INTRAVENOUS

## 2018-06-22 MED ORDER — METOPROLOL TARTRATE 25 MG PO TABS
25.0000 mg | ORAL_TABLET | Freq: Two times a day (BID) | ORAL | Status: DC
  Administered 2018-06-22 – 2018-06-24 (×5): 25 mg via ORAL
  Filled 2018-06-22 (×5): qty 1

## 2018-06-22 MED ORDER — ASPIRIN 300 MG RE SUPP: 300.0000 mg | RECTAL | Status: AC

## 2018-06-22 MED ORDER — ACETAMINOPHEN 325 MG PO TABS
650.0000 mg | ORAL_TABLET | ORAL | Status: DC | PRN
  Administered 2018-06-22 – 2018-06-24 (×5): 650 mg via ORAL
  Filled 2018-06-22 (×5): qty 2

## 2018-06-22 MED ORDER — INSULIN ASPART 100 UNIT/ML ~~LOC~~ SOLN
4.0000 [IU] | Freq: Three times a day (TID) | SUBCUTANEOUS | Status: DC
  Administered 2018-06-22 – 2018-06-24 (×5): 4 [IU] via SUBCUTANEOUS

## 2018-06-22 MED ORDER — TIROFIBAN HCL IN NACL 5-0.9 MG/100ML-% IV SOLN
0.1500 ug/kg/min | INTRAVENOUS | Status: DC
  Administered 2018-06-22 – 2018-06-23 (×4): 0.15 ug/kg/min via INTRAVENOUS
  Filled 2018-06-22 (×5): qty 100

## 2018-06-22 MED ORDER — IOHEXOL 350 MG/ML SOLN
INTRAVENOUS | Status: DC | PRN
  Administered 2018-06-22: 115 mL via INTRAVENOUS

## 2018-06-22 MED ORDER — LIDOCAINE HCL (PF) 1 % IJ SOLN
INTRAMUSCULAR | Status: AC
  Filled 2018-06-22: qty 30

## 2018-06-22 MED ORDER — PERFLUTREN LIPID MICROSPHERE
1.0000 mL | INTRAVENOUS | Status: AC | PRN
  Administered 2018-06-22: 5 mL via INTRAVENOUS
  Filled 2018-06-22: qty 10

## 2018-06-22 MED ORDER — HEPARIN BOLUS VIA INFUSION
4000.0000 [IU] | Freq: Once | INTRAVENOUS | Status: AC
  Administered 2018-06-22: 4000 [IU] via INTRAVENOUS

## 2018-06-22 MED ORDER — MUPIROCIN 2 % EX OINT
1.0000 "application " | TOPICAL_OINTMENT | Freq: Two times a day (BID) | CUTANEOUS | Status: DC
  Administered 2018-06-22 – 2018-06-25 (×5): 1 via NASAL
  Filled 2018-06-22 (×2): qty 22

## 2018-06-22 MED ORDER — TIROFIBAN (AGGRASTAT) BOLUS VIA INFUSION
25.0000 ug/kg | Freq: Once | INTRAVENOUS | Status: AC
  Administered 2018-06-22: 2735 ug via INTRAVENOUS
  Filled 2018-06-22: qty 55

## 2018-06-22 MED ORDER — ASPIRIN 81 MG PO CHEW: 324.0000 mg | CHEWABLE_TABLET | Freq: Once | ORAL | Status: DC

## 2018-06-22 MED ORDER — ATORVASTATIN CALCIUM 80 MG PO TABS
80.0000 mg | ORAL_TABLET | Freq: Every day | ORAL | Status: DC
  Administered 2018-06-22 – 2018-06-28 (×6): 80 mg via ORAL
  Filled 2018-06-22 (×6): qty 1

## 2018-06-22 MED ORDER — CHLORHEXIDINE GLUCONATE CLOTH 2 % EX PADS
6.0000 | MEDICATED_PAD | Freq: Every day | CUTANEOUS | Status: DC
  Administered 2018-06-23 – 2018-06-25 (×2): 6 via TOPICAL

## 2018-06-22 MED ORDER — ASPIRIN 81 MG PO CHEW: 324.0000 mg | CHEWABLE_TABLET | ORAL | Status: AC

## 2018-06-22 MED ORDER — HEPARIN SODIUM (PORCINE) 1000 UNIT/ML IJ SOLN
INTRAMUSCULAR | Status: AC
  Filled 2018-06-22: qty 1

## 2018-06-22 MED ORDER — VERAPAMIL HCL 2.5 MG/ML IV SOLN
INTRAVENOUS | Status: AC
  Filled 2018-06-22: qty 2

## 2018-06-22 MED ORDER — INSULIN ASPART 100 UNIT/ML ~~LOC~~ SOLN
0.0000 [IU] | Freq: Three times a day (TID) | SUBCUTANEOUS | Status: DC
  Administered 2018-06-22: 8 [IU] via SUBCUTANEOUS
  Administered 2018-06-23 – 2018-06-24 (×4): 5 [IU] via SUBCUTANEOUS
  Administered 2018-06-24 (×2): 3 [IU] via SUBCUTANEOUS

## 2018-06-22 MED ORDER — HEPARIN (PORCINE) IN NACL 1000-0.9 UT/500ML-% IV SOLN
INTRAVENOUS | Status: AC
  Filled 2018-06-22: qty 1500

## 2018-06-22 MED ORDER — ASPIRIN EC 81 MG PO TBEC
81.0000 mg | DELAYED_RELEASE_TABLET | Freq: Every day | ORAL | Status: DC
  Administered 2018-06-23 – 2018-06-24 (×2): 81 mg via ORAL
  Filled 2018-06-22 (×2): qty 1

## 2018-06-22 MED ORDER — HYDROCHLOROTHIAZIDE 25 MG PO TABS
25.0000 mg | ORAL_TABLET | Freq: Every day | ORAL | Status: DC
  Administered 2018-06-22 – 2018-06-24 (×3): 25 mg via ORAL
  Filled 2018-06-22 (×3): qty 1

## 2018-06-22 MED ORDER — HEPARIN (PORCINE) IN NACL 1000-0.9 UT/500ML-% IV SOLN
INTRAVENOUS | Status: DC | PRN
  Administered 2018-06-22 (×2): 500 mL

## 2018-06-22 MED ORDER — INSULIN ASPART 100 UNIT/ML ~~LOC~~ SOLN
0.0000 [IU] | Freq: Every day | SUBCUTANEOUS | Status: DC
  Administered 2018-06-24: 2 [IU] via SUBCUTANEOUS

## 2018-06-22 SURGICAL SUPPLY — 12 items
CATH INFINITI 5 FR JL3.5 (CATHETERS) ×2 IMPLANT
CATH INFINITI 5FR ANG PIGTAIL (CATHETERS) ×2 IMPLANT
CATH OPTITORQUE TIG 4.0 5F (CATHETERS) ×2 IMPLANT
DEVICE RAD COMP TR BAND LRG (VASCULAR PRODUCTS) ×2 IMPLANT
GLIDESHEATH SLEND A-KIT 6F 22G (SHEATH) ×2 IMPLANT
GUIDEWIRE INQWIRE 1.5J.035X260 (WIRE) ×1 IMPLANT
INQWIRE 1.5J .035X260CM (WIRE) ×2
KIT HEART LEFT (KITS) ×2 IMPLANT
PACK CARDIAC CATHETERIZATION (CUSTOM PROCEDURE TRAY) ×2 IMPLANT
SYR MEDRAD MARK 7 150ML (SYRINGE) ×2 IMPLANT
TRANSDUCER W/STOPCOCK (MISCELLANEOUS) ×2 IMPLANT
TUBING CIL FLEX 10 FLL-RA (TUBING) ×2 IMPLANT

## 2018-06-22 NOTE — ED Notes (Signed)
Report called to [facility]

## 2018-06-22 NOTE — Progress Notes (Signed)
ANTICOAGULATION CONSULT NOTE - Initial Consult  Pharmacy Consult for heparin Indication: chest pain/ACS  Allergies  Allergen Reactions  . Other     Fruits with a pit. Throat swells up. Can have them if cooked     Patient Measurements: Height: 6' 2.5" (189.2 cm) Weight: 240 lb (108.9 kg) IBW/kg (Calculated) : 83.35 Heparin Dosing Weight: 105kg  Vital Signs: Temp: 97.9 F (36.6 C) (11/24 0950) Temp Source: Oral (11/24 0950) BP: 124/89 (11/24 1059) Pulse Rate: 102 (11/24 1059)  Labs: Recent Labs    06/22/18 1003  HGB 16.1  HCT 48.3  PLT 258  CREATININE 0.97  TROPONINI 12.60*    Estimated Creatinine Clearance: 113.9 mL/min (by C-G formula based on SCr of 0.97 mg/dL).   Medical History: Past Medical History:  Diagnosis Date  . Allergy   . Charcot's joint of left foot, non-diabetic 02/03/2015  . Diabetes ([place])    type 2  . Hypertension   . Neuropathy of left foot 02/03/2015  . Obstructive sleep apnea 05/07/2014    Medications:  Scheduled:  . metoprolol tartrate  25 mg Oral Once    Assessment: 77 YOM presenting with CP to [facility], elevated troponin and pharmacy consulted to start heparin, no AC PTA and CBC wnl.    Goal of Therapy:  Heparin level 0.3-0.7 units/ml Monitor platelets by anticoagulation protocol: Yes   Plan:  Heparin 4000 unit bolus and gtt at 1300 units/hr F/u 6 hour heparin level Daily CBC, s/s bleeding  Bertis Ruddy, PharmD Clinical Pharmacist Please check AMION for all [place] numbers 06/22/2018 11:38 AM

## 2018-06-22 NOTE — Progress Notes (Signed)
ANTICOAGULATION CONSULT NOTE - Initial Consult  Pharmacy Consult for heparin Indication: chest pain/ACS  Allergies  Allergen Reactions  . Other     Fruits with a pit. Throat swells up. Can have them if cooked     Patient Measurements: Height: 6' 2.5" (189.2 cm) Weight: 241 lb 2.9 oz (109.4 kg) IBW/kg (Calculated) : 83.35 Heparin Dosing Weight: 105kg  Vital Signs: Temp: 98.5 F (36.9 C) (11/24 1352) Temp Source: Oral (11/24 1352) BP: 143/99 (11/24 1415) Pulse Rate: 93 (11/24 1415)  Labs: Recent Labs    06/22/18 1003  HGB 16.1  HCT 48.3  PLT 258  CREATININE 0.97  TROPONINI 12.60*    Estimated Creatinine Clearance: 114.2 mL/min (by C-G formula based on SCr of 0.97 mg/dL).   Medical History: Past Medical History:  Diagnosis Date  . Allergy   . Charcot's joint of left foot, non-diabetic 02/03/2015  . Diabetes ([place])    type 2  . Hypertension   . Neuropathy of left foot 02/03/2015  . Obstructive sleep apnea 05/07/2014  . Uncontrolled type 2 diabetes mellitus ([place]) 06/22/2018    Medications:  Scheduled:  . aspirin  324 mg Oral NOW   Or  . aspirin  300 mg Rectal NOW  . [START ON 06/23/2018] aspirin EC  81 mg Oral Daily  . atorvastatin  80 mg Oral q1800  . insulin aspart  0-15 Units Subcutaneous TID WC  . insulin aspart  0-5 Units Subcutaneous QHS  . insulin aspart  4 Units Subcutaneous TID WC  . metoprolol tartrate  25 mg Oral BID    Assessment: 63 YOM presenting with CP to [facility], elevated troponin and pharmacy consulted to start heparin, no AC PTA and CBC wnl.    Patient s/p urgent cath, found to have multivessel disease and cvts consulted and evaluated in lab. No P2y12 given, will restart heparin tonight  Goal of Therapy:  Heparin level 0.3-0.7 units/ml Monitor platelets by anticoagulation protocol: Yes   Plan:  Restart heparin tonight at 8pm at 1300 units/hr Check heparin level with am labs Follow up surgical plan  Erin Hearing PharmD., BCPS Clinical  Pharmacist 06/22/2018 2:56 PM

## 2018-06-22 NOTE — Progress Notes (Signed)
  Echocardiogram 2D Echocardiogram has been performed.  Joshua Branch 06/22/2018, 2:45 PM

## 2018-06-22 NOTE — ED Triage Notes (Signed)
PResents with sternal chest pain that began Friday, is constant and described as "JUST PAIN" it is made worse by lying down and nothing makes it better. He has tried tums and that is not helping. The pain is severe. DEnies dizziness and SOB.

## 2018-06-22 NOTE — ED Notes (Signed)
Date and time results received: 06/22/18 1052   Test: trp Critical Value: 12.60 Name of Provider Notified: [place] Orders Received? Or Actions Taken?: no orders given

## 2018-06-22 NOTE — Progress Notes (Signed)
CRITICAL VALUE ALERT  Critical Value:  Troponin 9.09  Date & Time Notied: 11/24 1952  Provider Notified: No, expected value. Troponin trending down  Orders Received/Actions taken: RN will continue to monitor pt.

## 2018-06-22 NOTE — H&P (Addendum)
Joshua Branch is an 57 y.o. male.   Chief Complaint: Chest pain HPI: [facility]  57 year old Caucasian male with uncontrolled type II diabetes mellitus, hypertension, nonhealing wound right sole.  Patient presented to [facility] on 06/22/2018 morning with left-sided chest pain that started Friday 06/20/2018 afternoon.  He has been having nearly constant pain since then.  He reports worsening with deep breathing and supine position.  He denies any associated shortness of breath.  Serial EKGs at that [facility] center showed subtle ST elevations in multiple leads.  Although no clear definition for STEMI, given patient's ongoing chest pain and elevated troponin of 12 ng/mL, I recommended transfer to [facility] for urgent cardiac catheterization. Coronary angiography showed severe multivessel disease, detailed below, with possible culprit being admitted LAD lesion, that is partially recanalized. Patient was chest pain-free at the end of the diagnostic angiogram.   At baseline, patient is an attorney by profession.  He plays tennis regularly, albeit not too strenuously.  Denies any angina during his physical activities prior to this event.  He has a nonhealing wound on his right sole, and a well-healing wound on left sole.  He has been seen podiatrist Dr. Cannon Kettle who had done surgical debridement and was pleased with the healing progress, as seen on 06/10/2018.  Patient has uncontrolled type II diabetes mellitus which is only treated with metformin at this time.  His primary care physician is Evern Core, PA-C, who had recently referred the patient to an endocrinologist.. Patient's wife is an occupational therapist- Atif Chapple- who works at [employer].    Past Medical History:  Diagnosis Date  . Allergy   . Charcot's joint of left foot, non-diabetic 02/03/2015  . Diabetes ([place])    type 2  . Hypertension   . Neuropathy of left foot 02/03/2015  . Obstructive sleep apnea  05/07/2014  . Uncontrolled type 2 diabetes mellitus ([place]) 06/22/2018    Past Surgical History:  Procedure Laterality Date  . surgical debridement  Right    Right foot ulcer    Family History  Problem Relation Age of Onset  . Diabetes Mother   . Stroke Father   . Hypertension Father   . Hearing loss Father   . Colon polyps Father   . Colon cancer Neg Hx   . Esophageal cancer Neg Hx   . Prostate cancer Neg Hx    Social History:  reports that he has never smoked. He has never used smokeless tobacco. He reports that he drinks about 2.0 standard drinks of alcohol per week. He reports that he does not use drugs.  Allergies:  Allergies  Allergen Reactions  . Other     Fruits with a pit. Throat swells up. Can have them if cooked     Medications Prior to Admission  Medication Sig Dispense Refill  . amoxicillin-clavulanate (AUGMENTIN) 875-125 MG tablet Take 1 tablet by mouth 2 (two) times daily. 28 tablet 0  . cadexomer iodine (IODOSORB) 0.9 % gel Apply 1 application topically daily as needed for wound care. 40 g 0  . cetirizine (ZYRTEC) 10 MG tablet Take 1 tablet (10 mg total) by mouth daily. 30 tablet 11  . ciprofloxacin (CIPRO) 500 MG tablet Take 1 tablet (500 mg total) by mouth 2 (two) times daily. 20 tablet 0  . hydrochlorothiazide (MICROZIDE) 12.5 MG capsule TAKE 1 CAPSULE BY MOUTH DAILY.    Marland Kitchen lisinopril-hydrochlorothiazide (PRINZIDE,ZESTORETIC) 10-12.5 MG tablet TAKE 1 TABLET BY MOUTH  DAILY. 90 tablet 0  . metFORMIN (GLUCOPHAGE-XR) 500 MG 24 hr tablet TAKE 2 TABLETS BY MOUTH ONCE DAILY WITH BREAKFAST 60 tablet 3  . Olopatadine HCl 0.2 % SOLN   3  . sildenafil (REVATIO) 20 MG tablet TAKE 2 TO 5 TABLETS BY MOUTH AS NEEDED PRIOR TO SEX. (MAX DOSE PER DAY 100 MG) 100 tablet 0  . sulfamethoxazole-trimethoprim (BACTRIM DS,SEPTRA DS) 800-160 MG tablet Take 1 tablet by mouth 2 (two) times daily. 20 tablet 0    Results for orders placed or performed during the hospital encounter of  06/22/18 (from the past 48 hour(s))  Basic metabolic panel     Status: Abnormal   Collection Time: 06/22/18 10:03 AM  Result Value Ref Range   Sodium 129 (L) 135 - 145 mmol/L   Potassium 4.0 3.5 - 5.1 mmol/L   Chloride 92 (L) 98 - 111 mmol/L   CO2 27 22 - 32 mmol/L   Glucose, Bld 295 (H) 70 - 99 mg/dL   BUN 17 6 - 20 mg/dL   Creatinine, Ser 0.97 0.61 - 1.24 mg/dL   Calcium 9.1 8.9 - 10.3 mg/dL   GFR calc non Af Amer >60 >60 mL/min   GFR calc Af Amer >60 >60 mL/min    Comment: (NOTE) The eGFR has been calculated using the CKD EPI equation. This calculation has not been validated in all clinical situations. eGFR's persistently <60 mL/min signify possible Chronic Kidney Disease.    Anion gap 10 5 - 15    Comment: Performed at [facility], [address] [place], [place] [address]  CBC     Status: Abnormal   Collection Time: 06/22/18 10:03 AM  Result Value Ref Range   WBC 15.4 (H) 4.0 - 10.5 K/uL   RBC 5.35 4.22 - 5.81 MIL/uL   Hemoglobin 16.1 13.0 - 17.0 g/dL   HCT 48.3 39.0 - 52.0 %   MCV 90.3 80.0 - 100.0 fL   MCH 30.1 26.0 - 34.0 pg   MCHC 33.3 30.0 - 36.0 g/dL   RDW 11.4 (L) 11.5 - 15.5 %   Platelets 258 150 - 400 K/uL   nRBC 0.0 0.0 - 0.2 %    Comment: Performed at [facility], [address] [place], [place] [address]  Troponin I - ONCE - STAT     Status: Abnormal   Collection Time: 06/22/18 10:03 AM  Result Value Ref Range   Troponin I 12.60 (HH) <0.03 ng/mL    Comment: CRITICAL RESULT CALLED TO, READ BACK BY AND VERIFIED WITH: REED C RN [phone] PHILLIPS C Performed at [facility], [address] [place], [place] [address]   Glucose, capillary     Status: Abnormal   Collection Time: 06/22/18  3:52 PM  Result Value Ref Range   Glucose-Capillary 259 (H) 70 - 99 mg/dL   Dg Chest 2 View  Result Date: 06/22/2018 CLINICAL DATA:  Central chest pain for 2 days, heartburn type symptoms, history hypertension, type II diabetes  mellitus EXAM: CHEST - 2 VIEW COMPARISON:  None FINDINGS: Normal heart size, mediastinal contours, and pulmonary vascularity. Minimal bibasilar atelectasis. Lungs otherwise clear. No infiltrate, pleural effusion, or pneumothorax. Bones unremarkable. IMPRESSION: Minimal bibasilar atelectasis. Electronically Signed   By: Lavonia Dana M.D.   On: 06/22/2018 10:42    Review of Systems  Constitutional: Negative.   HENT: Negative.   Eyes: Negative.   Respiratory: Negative for shortness of breath.  Cardiovascular: Positive for chest pain (currently absent). Negative for leg swelling.  Gastrointestinal: Negative for abdominal pain and nausea.  Genitourinary: Negative.   Musculoskeletal:       Right sole nonhealing ulcer  Skin: Negative.   Neurological: Negative for dizziness and loss of consciousness.  Endo/Heme/Allergies: Does not bruise/bleed easily.  Psychiatric/Behavioral: Negative.   All other systems reviewed and are negative.   Blood pressure (!) 142/88, pulse 100, temperature 98.5 F (36.9 C), temperature source Oral, resp. rate 20, height 6' 2.5" (1.892 m), weight 109.4 kg, SpO2 95 %. Physical Exam  Nursing note and vitals reviewed. Constitutional: He is oriented to person, place, and time. He appears well-developed and well-nourished. No distress.  HENT:  Head: Normocephalic and atraumatic.  Eyes: Pupils are equal, round, and reactive to light. Conjunctivae are normal.  Neck: No JVD present.  Cardiovascular: Normal rate, regular rhythm, normal heart sounds and intact distal pulses.  No murmur heard. Pulses:      Dorsalis pedis pulses are 2+ on the right side, and 2+ on the left side.       Posterior tibial pulses are 2+ on the right side, and 2+ on the left side.  Respiratory: Effort normal and breath sounds normal. He has no wheezes. He has no rales.  GI: Soft. Bowel sounds are normal. There is no rebound.  Musculoskeletal: He exhibits no edema.  Lymphadenopathy:    He has no  cervical adenopathy.  Neurological: He is alert and oriented to person, place, and time.  Skin: Skin is warm and dry.  Deep trophic ulcer on the ball of right foot. No erythema, purulent discharge.  Psychiatric: He has a normal mood and affect.   Cardiac studies:   EKG 06/22/2018: Sinus tachycardia 103 bpm, left axis deviation.  Diffuse ST elevation in anterolateral leads .  No reciprocal changes seen.  Possible acute lateral infarct.  Echocardiogram 06/22/2018: Study Conclusions  - Left ventricle: The cavity size was normal. Wall thickness was   normal. Systolic function was normal. The estimated ejection   fraction was in the range of 50% to 55%. Mild hypokinesis of the   apical myocardium. No thrombus seen on contrast study. Left   ventricular diastolic function parameters were normal. - Aortic valve: Mildly calcified annulus. Mildly thickened   leaflets. No significant stenosis or regurgitation. - Mitral valve: Mildly calcified annulus. No significant stenosis   or regurgitation.  Cath 06/22/2018: LM: Normal LAD: Prox ectatic areas with 40% stenoses          Mid to distal LAD is likely the culprit vessel. Mid 100% occlusion, with recanalization.Distal apical LAD is                completely occluded and receives faint collaterals from septal perforators, and distal RPDA. TIMI 1 flow in distal          LAD, TIMI 0 flow in distal apical LAD LCx: Large OM 1 with proxiaml 50% and mid to distal 80% stenosis         Medium sized OM2 completely occluded with collaterals from OM1.  RCA: Dominant. Prox 40%, mid 80%, and ostial RPDA 95% stenoses. RPDA gives faint collateral to apical distal   LVEF 50-55% with apical hypokinesis.   Assessment/Plan:  57 year old Caucasian male with uncontrolled type II diabetes mellitus, hypertension, nonhealing trophic ulcer right sole, now with acute coronary syndrome  Acute coronary syndrome: Mild ST elevation in multiple leads. Likely late  presentation of anteroalteral infarct with partial recanalization  of LAD.Severe multivessel CAD, as detailed above. Given patient's absence of chest pain at the end of the cardiac catheterization, I decided to not comment him to percutaneous revascularization.  We'll discuss surgical versus percutaneous revascularization.  Dr. Roxy Manns will see the patient.  Options include CABG with LIMA-LAD, as well as grafts to OM 2, right [name] versus attempt of percutaneous revascularization to mid to distal LAD, chronically occluded OM 2, as well as mid RCA and ostial [name].  If LIMA-LAD graft can't be achieved, he will then benefit more from percutaneous revascularization.  Due to new aspirin, heparin for now.  We'll also add tirofiban after removal of TR band.  Hold P2Y12i for now.  Recommend nitroglycerin drip. Continue metoprolol, lipitor. Will add ACEi on discharge  Unfontrolled type 2 DM: A1C 11%. Only on metformin at basline. WIll hold metofrmin while inpatient. Insulin coverage  Nonhealing Rt sole ulcer: Intact distal pulses. Ulcer is trophic with no acute infection. Podiatry consult.   I discussed the plan with patient's wife Osten Janek and family, at length.     Nigel Mormon, MD 06/22/2018, 3:58 PM  [place], MD [facility]. [place] Pager: [phone] Office: [phone] If no answer Cell [phone]

## 2018-06-22 NOTE — ED Notes (Signed)
Pt placed on 1LPM O2 via [place] by MD verbal order, due to SpO2 in low 90's

## 2018-06-22 NOTE — ED Notes (Addendum)
C/o chest pain onset Friday  Worse when lying down  Pain increases w movement

## 2018-06-22 NOTE — ED Provider Notes (Signed)
[facility] EMERGENCY DEPARTMENT Provider Note   CSN: [id number] Arrival date & time: 06/22/18  2536     History   Chief Complaint Chief Complaint  Patient presents with  . Chest Pain    HPI Joshua Branch is a 57 y.o. male possible history of diabetes, hypertension, neuropathy of left foot who presents for evaluation of chest pain that is been ongoing since Friday.  He states that Friday, he started experiencing some heartburn.  He states that at first, felt like what he usually has with his GERD.  He states he was recently seen by GI and had endoscopy that showed some irritation and was told to take omeprazole.  Patient reports that he did not have any omeprazole so he took Zegerid and Tums with no improvement.  He states that his pain continued to get worse and changing to a pain that he had not had before.  He states it is a sharp pain throughout his entire chest.  It does not radiate.  States it is been constant since Friday.  He reports it is worse with deep inspiration.  He also reports that is very worse when he lays down flat.  He was unable to sleep last night secondary to worsening pain.  He states pain is improved if he sits forward.  He states that the pain is worse with exertion.  He reports that he tried to take his dog for a walk today which he is normally able to do without any difficulty and had to stop after short distance because the pain was worse.  He reports no associated diaphoresis, nausea/vomiting.  Patient reports he has not taken any other medications for the pain.  He is not a current smoker.  He denies any cocaine, IV drug use.  He reports a history of high blood pressure, diabetes.  He does report that mom had cardiovascular disease but denies any family members with MIs before the age of 38. He denies recent immobilization, prior history of DVT/PE, recent surgery, leg swelling, or long travel.  Patient denies any recent sickness, fevers, cough,  congestion, difficulty breathing, abdominal pain, nausea/vomiting.  The history is provided by the patient.    Past Medical History:  Diagnosis Date  . Acute myocardial infarction ([place]) 06/22/2018  . Allergy   . Charcot's joint of left foot, non-diabetic   . Coronary artery disease involving native coronary artery with unstable angina pectoris ([place]) 06/22/2018  . Diabetes ([place])    type 2  . Hyperlipidemia   . Hypertension   . Neuropathy of left foot   . Obstructive sleep apnea   . Right foot ulcer ([place])   . Type II diabetes mellitus with complication, uncontrolled ([place])   . Uncontrolled type 2 diabetes mellitus [facility])     Patient Active Problem List   Diagnosis Date Noted  . Acute coronary syndrome ([place]) 06/22/2018  . Uncontrolled type 2 diabetes mellitus ([place]) 06/22/2018  . Nonhealing skin ulcer ([place]) 06/22/2018  . Coronary artery disease involving native coronary artery with unstable angina pectoris ([place]) 06/22/2018  . Acute myocardial infarction ([place]) 06/22/2018  . Hyperlipidemia   . Type II diabetes mellitus with complication, uncontrolled ([place])   . 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 Vertigo 08/20/2016  . Myringotomy tube status 08/20/2016  . Ear fullness, left 08/20/2016  . Asymmetrical left sensorineural hearing loss 02/06/2016  . Neuropathy of left foot 02/03/2015  . Charcot's joint of left foot, non-diabetic 02/03/2015  .  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 Hyperglycemia 07/21/2014  . Cramping of hands 06/02/2014  . Eustachian tube dysfunction 06/02/2014  . HTN (hypertension) 05/07/2014  . Obstructive sleep apnea 05/07/2014    Past Surgical History:  Procedure Laterality Date  . surgical debridement  Right    Right foot ulcer        Home Medications    Prior to Admission medications   Medication Sig Start Date End Date Taking? Authorizing Provider  amoxicillin-clavulanate (AUGMENTIN) 875-125 MG tablet Take 1 tablet by mouth 2  (two) times daily. 04/08/18   Landis Martins, DPM  cadexomer iodine (IODOSORB) 0.9 % gel Apply 1 application topically daily as needed for wound care. 04/08/18   Landis Martins, DPM  cetirizine (ZYRTEC) 10 MG tablet Take 1 tablet (10 mg total) by mouth daily. 11/30/15   Saguier, Percell Miller, PA-C  ciprofloxacin (CIPRO) 500 MG tablet Take 1 tablet (500 mg total) by mouth 2 (two) times daily. 04/11/18   Trula Slade, DPM  hydrochlorothiazide (MICROZIDE) 12.5 MG capsule TAKE 1 CAPSULE BY MOUTH DAILY. 01/14/18   [provider]  lisinopril-hydrochlorothiazide (PRINZIDE,ZESTORETIC) 10-12.5 MG tablet TAKE 1 TABLET BY MOUTH DAILY. 06/06/18   Saguier, Percell Miller, PA-C  metFORMIN (GLUCOPHAGE-XR) 500 MG 24 hr tablet TAKE 2 TABLETS BY MOUTH ONCE DAILY WITH BREAKFAST 06/06/18   Saguier, Percell Miller, PA-C  Olopatadine HCl 0.2 % SOLN  04/16/18   [provider]  sildenafil (REVATIO) 20 MG tablet TAKE 2 TO 5 TABLETS BY MOUTH AS NEEDED PRIOR TO SEX. (MAX DOSE PER DAY 100 MG) 06/06/18   Saguier, Percell Miller, PA-C  sulfamethoxazole-trimethoprim (BACTRIM DS,SEPTRA DS) 800-160 MG tablet Take 1 tablet by mouth 2 (two) times daily. 04/11/18   Trula Slade, DPM    Family History Family History  Problem Relation Age of Onset  . Diabetes Mother   . Stroke Father   . Hypertension Father   . Hearing loss Father   . Colon polyps Father   . Colon cancer Neg Hx   . Esophageal cancer Neg Hx   . Prostate cancer Neg Hx     Social History Social History   Tobacco Use  . Smoking status: Never Smoker  . Smokeless tobacco: Never Used  Substance Use Topics  . Alcohol use: Yes    Alcohol/week: 2.0 standard drinks    Types: 2 Cans of beer per week    Comment: 2 cans of beer a month  . Drug use: Never     Allergies   Other   Review of Systems Review of Systems  Constitutional: Negative for fever.  Respiratory: Negative for cough and shortness of breath.   Cardiovascular: Positive for chest pain. Negative  for leg swelling.  Gastrointestinal: Negative for abdominal pain, nausea and vomiting.  Genitourinary: Negative for dysuria and hematuria.  Neurological: Negative for headaches.  All other systems reviewed and are negative.    Physical Exam Updated Vital Signs BP 104/79   Pulse (!) 103   Temp 99.7 F (37.6 C) (Oral)   Resp (!) 26   Ht 6' 2.5" (1.892 m)   Wt 109.4 kg   SpO2 92%   BMI 30.55 kg/m   Physical Exam  Constitutional: He is oriented to person, place, and time. He appears well-developed and well-nourished.  Appears uncomfortable but no acute distress   HENT:  Head: Normocephalic and atraumatic.  Mouth/Throat: Oropharynx is clear and moist and mucous membranes are normal.  Eyes: Pupils are equal, round, and reactive to light. Conjunctivae, EOM  and lids are normal.  Neck: Full passive range of motion without pain.  Cardiovascular: Normal rate, regular rhythm, normal heart sounds and normal pulses. Exam reveals no gallop and no friction rub.  No murmur heard. Pulses:      Radial pulses are 2+ on the right side, and 2+ on the left side.  Pulmonary/Chest: Effort normal and breath sounds normal.  Lungs clear to auscultation bilaterally.  Symmetric chest rise.  No wheezing, rales, rhonchi.  Pain is not reproduced by palpation or movement of his extremities.  Abdominal: Soft. Normal appearance. There is no tenderness. There is no rigidity and no guarding.  Musculoskeletal: Normal range of motion.  Biateral lower extremities are symmetric in appearance without any overlying warmth, erythema, edema.  Neurological: He is alert and oriented to person, place, and time.  Skin: Skin is warm and dry. Capillary refill takes less than 2 seconds.  Psychiatric: He has a normal mood and affect. His speech is normal.  Nursing note and vitals reviewed.    ED Treatments / Results  Labs (all labs ordered are listed, but only abnormal results are displayed) Labs Reviewed  MRSA PCR  SCREENING - Abnormal; Notable for the following components:      Result Value   MRSA by PCR POSITIVE (*)    All other components within normal limits  BASIC METABOLIC PANEL - Abnormal; Notable for the following components:   Sodium 129 (*)    Chloride 92 (*)    Glucose, Bld 295 (*)    All other components within normal limits  CBC - Abnormal; Notable for the following components:   WBC 15.4 (*)    RDW 11.4 (*)    All other components within normal limits  TROPONIN I - Abnormal; Notable for the following components:   Troponin I 12.60 (*)    All other components within normal limits  GLUCOSE, CAPILLARY - Abnormal; Notable for the following components:   Glucose-Capillary 259 (*)    All other components within normal limits  HIV ANTIBODY (ROUTINE TESTING W REFLEX)  TROPONIN I  TROPONIN I  URINALYSIS, COMPLETE (UACMP) WITH MICROSCOPIC  CBC WITH DIFFERENTIAL/PLATELET  LIPID PANEL  HEPARIN LEVEL (UNFRACTIONATED)  BLOOD GAS, ARTERIAL  BRAIN NATRIURETIC PEPTIDE  COMPREHENSIVE METABOLIC PANEL  APTT  PROTIME-INR  HEMOGLOBIN A1C  TYPE AND SCREEN    EKG EKG Interpretation  Date/Time:  Sunday June 22 2018 10:00:39 EST Ventricular Rate:  98 PR Interval:    QRS Duration: 103 QT Interval:  330 QTC Calculation: 422 R Axis:   -44 Text Interpretation:  Sinus rhythm Probable left atrial enlargement Left anterior fascicular block Low voltage, precordial leads Anterolateral infarct, recent PR depressions, slight ST elevation inferior leads less than 73mm No significant change since last tracing Confirmed by Gareth Morgan [phone]) on 06/22/2018 10:09:17 AM   Radiology Dg Chest 2 View  Result Date: 06/22/2018 CLINICAL DATA:  Central chest pain for 2 days, heartburn type symptoms, history hypertension, type II diabetes mellitus EXAM: CHEST - 2 VIEW COMPARISON:  None FINDINGS: Normal heart size, mediastinal contours, and pulmonary vascularity. Minimal bibasilar atelectasis. Lungs  otherwise clear. No infiltrate, pleural effusion, or pneumothorax. Bones unremarkable. IMPRESSION: Minimal bibasilar atelectasis. Electronically Signed   By: Lavonia Dana M.D.   On: 06/22/2018 10:42    Procedures .Critical Care Performed by: Volanda Napoleon, PA-C Authorized by: Volanda Napoleon, PA-C   Critical care provider statement:    Critical care time (minutes):  45   Critical care was  necessary to treat or prevent imminent or life-threatening deterioration of the following conditions:  Cardiac failure, circulatory failure and respiratory failure   Critical care was time spent personally by me on the following activities:  Discussions with consultants, evaluation of patient's response to treatment, examination of patient, ordering and performing treatments and interventions, ordering and review of laboratory studies, ordering and review of radiographic studies, pulse oximetry, re-evaluation of patient's condition, obtaining history from patient or surrogate and review of old charts   (including critical care time)  Medications Ordered in ED Medications  aspirin chewable tablet 324 mg (324 mg Oral Not Given 06/22/18 1349)    Or  aspirin suppository 300 mg ( Rectal See Alternative 06/22/18 1349)  aspirin EC tablet 81 mg (has no administration in time range)  acetaminophen (TYLENOL) tablet 650 mg (650 mg Oral Given 06/22/18 1522)  ondansetron (ZOFRAN) injection 4 mg (has no administration in time range)  nitroGLYCERIN 50 mg in dextrose 5 % 250 mL (0.2 mg/mL) infusion (30 mcg/min Intravenous Rate/Dose Change 06/22/18 1505)  atorvastatin (LIPITOR) tablet 80 mg (80 mg Oral Given 06/22/18 1718)  metoprolol tartrate (LOPRESSOR) tablet 25 mg (has no administration in time range)  insulin aspart (novoLOG) injection 0-15 Units (8 Units Subcutaneous Given 06/22/18 1718)  insulin aspart (novoLOG) injection 0-5 Units (has no administration in time range)  insulin aspart (novoLOG) injection 4  Units (4 Units Subcutaneous Given 06/22/18 1719)  heparin ADULT infusion 100 units/mL (25000 units/243mL sodium chloride 0.45%) (has no administration in time range)  perflutren lipid microspheres (DEFINITY) IV suspension (5 mLs Intravenous Given 06/22/18 1617)  meclizine (ANTIVERT) tablet 25 mg (has no administration in time range)  hydrochlorothiazide (HYDRODIURIL) tablet 25 mg (25 mg Oral Given 06/22/18 1715)  tirofiban (AGGRASTAT) bolus via infusion 2,735 mcg (has no administration in time range)  tirofiban (AGGRASTAT) infusion 50 mcg/mL 100 mL (has no administration in time range)  morphine 4 MG/ML injection 4 mg (4 mg Intravenous Given 06/22/18 1043)  aspirin chewable tablet 324 mg (324 mg Oral Given 06/22/18 1104)  metoprolol tartrate (LOPRESSOR) tablet 25 mg (25 mg Oral Given 06/22/18 1140)  heparin bolus via infusion 4,000 Units (4,000 Units Intravenous Bolus from Bag 06/22/18 1149)     Initial Impression / Assessment and Plan / ED Course  I have reviewed the triage vital signs and the nursing notes.  Pertinent labs & imaging results that were available during my care of the patient were reviewed by me and considered in my medical decision making (see chart for details).     57 year old male with past medical history of hypertension, diabetes who presents for evaluation of chest pain that began 2 days ago.  Initially began with some heartburn and then transition and sharper pain.  Worse with deep inspiration, worse with lying down, worse with exertion.  No associated shortness of breath. Patient is afebrile.  Appears uncomfortable but in no acute distress.  Vital signs reviewed and stable.  Consider ACS versus pericarditis/myocarditis, versus GERD.  Do not suspect PE given history/physical exam.  We will plan to check basic labs, EKG, chest x-ray.  BMP shows sodium 129, glucose of 295.  BUN and creatinine normal.  CBC shows leukocytosis of 15.4.  Troponin is elevated at 12.60.  Chest  x-ray shows minimal atelectasis.  Otherwise unremarkable.  Given elevation in troponin, will plan to consult cardiology.  11:25 AM: Dr. Billy Fischer discussed with Cardiology.  We will plan for transfer for emergent Cath Lab activation.  No STEMI activation  at this time.  Will start heparin here in the ED.  Updated  patient on plan.  Patient stable for transfer to [facility] for cardiac intervention.  Final Clinical Impressions(s) / ED Diagnoses   Final diagnoses:  NSTEMI (non-ST elevated myocardial infarction) ([place])  Chest pain due to myocardial ischemia, unspecified ischemic chest pain type  Elevated troponin    ED Discharge Orders    None       Desma Mcgregor 06/22/18 1738    Gareth Morgan, MD 06/29/18 1330

## 2018-06-22 NOTE — ED Notes (Signed)
ED Provider at bedside. 

## 2018-06-22 NOTE — ED Notes (Signed)
Pt placed on zoll per Dr. Billy Fischer

## 2018-06-22 NOTE — Consult Note (Signed)
[address]       [place] [address]             [phone]          CARDIOTHORACIC SURGERY CONSULTATION REPORT  PCP is Saguier, Percell Miller, PA-C Referring Provider is Patwardhan, Reynold Bowen, MD  Reason for consultation:  Severe 3-vessel CAD s/p acute STEMI  HPI:  Patient is a 57 year old male with no previous history of coronary artery disease but risk factors notable for history of hypertension, poorly controlled type 2 diabetes, hyperlipidemia, and a family history of coronary artery disease who was admitted with delayed presentation of acute anterior apical myocardial infarction and has been referred for surgical consultation to discuss treatment options of for management of severe three-vessel coronary artery disease.  Patient states that several months ago he began to experience postprandial sharp substernal chest pain.  He eventually underwent upper GI endoscopy and colonoscopy and was told that he probably had reflux.  He has been taking omeprazole ever since.  Symptoms seem to have resolved until Friday morning when he developed sudden onset of severe sharp substernal chest pain.  This pain was somewhat different than previous episodes and considerably more severe.  Symptoms persisted although waxed and waned in severity over the next 48 hours.  Symptoms were not associated with any shortness of breath, diaphoresis, or radiating symptoms.  The patient was evaluated at [facility] where her EKG revealed sinus tachycardia with subtle ST segment elevation across the anterior and lateral leads.  Troponin was markedly elevated at 12 ng/mL.  Chest pain resolved with administration of oxygen, nitrates, heparin, and morphine.  The patient was transferred to [facility] where he underwent diagnostic cardiac catheterization by Dr. Virgina Jock.  The patient was found to have severe three-vessel coronary artery disease with subtotal occlusion of the  distal left anterior descending coronary artery.  There was mild left ventricular systolic dysfunction.  Cardiothoracic surgical consultation was requested.  The patient is married and lives locally in [name] with his wife.  They have 2 adult children.  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  Up until several months ago he had been playing tennis once a week, but he admits not terribly strenuously.  He has had problems with nonhealing ulcers on both feet.  Other than occasional tennis he does not exercise on a regular basis.  He denies any other previous history of exertional chest pain or shortness of breath.  He experiences no history of PND, orthopnea, palpitations, dizzy spells, syncope, nor lower extremity edema.  He was diagnosed with type 2 diabetes last summer.  He does not check his blood sugars at home on a regular basis.  He does not take insulin.  He was unaware that he has high cholesterol.   Past Medical History:  Diagnosis Date  . Allergy   . Charcot's joint of left foot, non-diabetic   . Diabetes ([place])    type 2  . Hypertension   . Neuropathy of left foot   . Obstructive sleep apnea   . Right foot ulcer ([place])   . Uncontrolled type 2 diabetes mellitus ([place])     Past Surgical History:  Procedure Laterality Date  . surgical debridement  Right    Right foot ulcer    Family History  Problem Relation Age of Onset  . Diabetes Mother   . Stroke Father   . Hypertension  Father   . Hearing loss Father   . Colon polyps Father   . Colon cancer Neg Hx   . Esophageal cancer Neg Hx   . Prostate cancer Neg Hx     Social History   Socioeconomic History  . Marital status: Married    Spouse name: [name]  . Number of children: 2  . Years of education: Not on file  . Highest education level: Professional school degree (e.g., MD, DDS, DVM, JD)  Occupational History  . Occupation: attorney  Social Needs  . Financial resource  strain: Not hard at all  . Food insecurity:    Worry: Not on file    Inability: Not on file  . Transportation needs:    Medical: Not on file    Non-medical: Not on file  Tobacco Use  . Smoking status: Never Smoker  . Smokeless tobacco: Never Used  Substance and Sexual Activity  . Alcohol use: Yes    Alcohol/week: 2.0 standard drinks    Types: 2 Cans of beer per week    Comment: 2 cans of beer a month  . Drug use: Never  . Sexual activity: Not on file  Lifestyle  . Physical activity:    Days per week: Not on file    Minutes per session: Not on file  . Stress: Not on file  Relationships  . Social connections:    Talks on phone: Not on file    Gets together: Not on file    Attends religious service: Not on file    Active member of club or organization: Not on file    Attends meetings of clubs or organizations: Not on file    Relationship status: Not on file  . Intimate partner violence:    Fear of current or ex partner: Not on file    Emotionally abused: Not on file    Physically abused: Not on file    Forced sexual activity: Not on file  Other Topics Concern  . Not on file  Social History Narrative  . Not on file    Prior to Admission medications   Medication Sig Start Date End Date Taking? Authorizing Provider  amoxicillin-clavulanate (AUGMENTIN) 875-125 MG tablet Take 1 tablet by mouth 2 (two) times daily. 04/08/18   Landis Martins, DPM  cadexomer iodine (IODOSORB) 0.9 % gel Apply 1 application topically daily as needed for wound care. 04/08/18   Landis Martins, DPM  cetirizine (ZYRTEC) 10 MG tablet Take 1 tablet (10 mg total) by mouth daily. 11/30/15   Saguier, Percell Miller, PA-C  ciprofloxacin (CIPRO) 500 MG tablet Take 1 tablet (500 mg total) by mouth 2 (two) times daily. 04/11/18   Trula Slade, DPM  hydrochlorothiazide (MICROZIDE) 12.5 MG capsule TAKE 1 CAPSULE BY MOUTH DAILY. 01/14/18   [provider]  lisinopril-hydrochlorothiazide (PRINZIDE,ZESTORETIC)  10-12.5 MG tablet TAKE 1 TABLET BY MOUTH DAILY. 06/06/18   Saguier, Percell Miller, PA-C  metFORMIN (GLUCOPHAGE-XR) 500 MG 24 hr tablet TAKE 2 TABLETS BY MOUTH ONCE DAILY WITH BREAKFAST 06/06/18   Saguier, Percell Miller, PA-C  Olopatadine HCl 0.2 % SOLN  04/16/18   [provider]  sildenafil (REVATIO) 20 MG tablet TAKE 2 TO 5 TABLETS BY MOUTH AS NEEDED PRIOR TO SEX. (MAX DOSE PER DAY 100 MG) 06/06/18   Saguier, Percell Miller, PA-C  sulfamethoxazole-trimethoprim (BACTRIM DS,SEPTRA DS) 800-160 MG tablet Take 1 tablet by mouth 2 (two) times daily. 04/11/18   Trula Slade, DPM    Current Facility-Administered Medications  Medication Dose  Route Frequency Provider Last Rate Last Dose  . acetaminophen (TYLENOL) tablet 650 mg  650 mg Oral Q4H PRN Nigel Mormon, MD   650 mg at 06/22/18 1522  . aspirin chewable tablet 324 mg  324 mg Oral NOW Patwardhan, Manish J, MD       Or  . aspirin suppository 300 mg  300 mg Rectal NOW Patwardhan, Manish J, MD      . Derrill Memo ON 06/23/2018] aspirin EC tablet 81 mg  81 mg Oral Daily Patwardhan, Manish J, MD      . atorvastatin (LIPITOR) tablet 80 mg  80 mg Oral q1800 Patwardhan, Manish J, MD      . heparin ADULT infusion 100 units/mL (25000 units/230mL sodium chloride 0.45%)  1,300 Units/hr Intravenous Continuous Lyndee Leo, RPH      . hydrochlorothiazide (HYDRODIURIL) tablet 25 mg  25 mg Oral Daily Adrian Prows, MD      . insulin aspart (novoLOG) injection 0-15 Units  0-15 Units Subcutaneous TID WC Patwardhan, Manish J, MD      . insulin aspart (novoLOG) injection 0-5 Units  0-5 Units Subcutaneous QHS Patwardhan, Manish J, MD      . insulin aspart (novoLOG) injection 4 Units  4 Units Subcutaneous TID WC Patwardhan, Manish J, MD      . meclizine (ANTIVERT) tablet 25 mg  25 mg Oral TID PRN Adrian Prows, MD      . metoprolol tartrate (LOPRESSOR) tablet 25 mg  25 mg Oral BID Patwardhan, Manish J, MD      . nitroGLYCERIN 50 mg in dextrose 5 % 250 mL (0.2 mg/mL) infusion  0-200  mcg/min Intravenous Titrated Patwardhan, Manish J, MD 9 mL/hr at 06/22/18 1505 30 mcg/min at 06/22/18 1505  . ondansetron (ZOFRAN) injection 4 mg  4 mg Intravenous Q6H PRN Patwardhan, Manish J, MD      . perflutren lipid microspheres (DEFINITY) IV suspension  1-10 mL Intravenous PRN Patwardhan, Manish J, MD   5 mL at 06/22/18 1617    Allergies  Allergen Reactions  . Other     Fruits with a pit. Throat swells up. Can have them if cooked       Review of Systems:   General:  decreased appetite, decreased energy, no weight gain, no weight loss, no fever  Cardiac:  no chest pain with exertion, + chest pain at rest, noSOB with exertion, no resting SOB, no PND, no orthopnea, no palpitations, no arrhythmia, no atrial fibrillation, no LE edema, no dizzy spells, no syncope  Respiratory:  no shortness of breath, no home oxygen, no productive cough, no dry cough, no bronchitis, no wheezing, no hemoptysis, no asthma, no pain with inspiration or cough, no sleep apnea, no CPAP at night  GI:   + difficulty swallowing, + reflux, no frequent heartburn, no hiatal hernia, no abdominal pain, no constipation, no diarrhea, no hematochezia, no hematemesis, no melena  GU:   no dysuria,  no frequency, no urinary tract infection, no hematuria, no enlarged prostate, no kidney stones, no kidney disease  Vascular:  no pain suggestive of claudication, no pain in feet, no leg cramps, no varicose veins, no DVT, + non-healing foot ulcer  Neuro:   no stroke, no TIA's, no seizures, no headaches, no temporary blindness one eye,  no slurred speech, no peripheral neuropathy, no chronic pain, no instability of gait, no memory/cognitive dysfunction  Musculoskeletal: + arthritis - primarily involving the foot, + joint swelling, no myalgias, mild difficulty walking, normal mobility  Skin:   no rash, no itching, no skin infections, + pressure sores or ulcerations  Psych:   no anxiety, no depression, no nervousness, no unusual recent  stress  Eyes:   no blurry vision, no floaters, no recent vision changes, + wears glasses or contacts  ENT:   no hearing loss, no loose or painful teeth, no dentures, last saw dentist within the past year  Hematologic:  no easy bruising, no abnormal bleeding, no clotting disorder, no frequent epistaxis  Endocrine:  + diabetes, does not check CBG's at home     Physical Exam:   BP 104/79   Pulse (!) 103   Temp 99.7 F (37.6 C) (Oral)   Resp (!) 26   Ht 6' 2.5" (1.892 m)   Wt 109.4 kg   SpO2 92%   BMI 30.55 kg/m   General:  Mildly obese,  well-appearing  HEENT:  Unremarkable   Neck:   no JVD, no bruits, no adenopathy   Chest:   clear to auscultation, symmetrical breath sounds, no wheezes, no rhonchi   CV:   RRR, no  murmur   Abdomen:  soft, non-tender, no masses   Extremities:  warm, well-perfused, pulses diminished, no lower extremity edema, + deep ulcer plantar surface right foot, no cellulitis  Rectal/GU  Deferred  Neuro:   Grossly non-focal and symmetrical throughout  Skin:   Clean and dry, no rashes, no breakdown  Diagnostic Tests:  Lab Results: Recent Labs    06/22/18 1003  WBC 15.4*  HGB 16.1  HCT 48.3  PLT 258   BMET:  Recent Labs    06/22/18 1003  NA 129*  K 4.0  CL 92*  CO2 27  GLUCOSE 295*  BUN 17  CREATININE 0.97  CALCIUM 9.1    CBG (last 3)  Recent Labs    06/22/18 1552  GLUCAP 259*   PT/INR:  No results for input(s): LABPROT, INR in the last 72 hours.  CXR:  CHEST - 2 VIEW  COMPARISON:  None  FINDINGS: Normal heart size, mediastinal contours, and pulmonary vascularity.  Minimal bibasilar atelectasis.  Lungs otherwise clear.  No infiltrate, pleural effusion, or pneumothorax.  Bones unremarkable.  IMPRESSION: Minimal bibasilar atelectasis.   Electronically Signed   By: Lavonia Dana M.D.   On: 06/22/2018 10:42    Transthoracic Echocardiography  Patient:    Joshua Branch, Joshua Branch MR #:       [id number] Study Date:  06/22/2018 Gender:     M Age:        77 Height:     189.2 cm Weight:     109.4 kg BSA:        2.42 m^2 Pt. Status: Room:       [facility]   SONOGRAPHER  Johny Chess, RDCS, CCT  ADMITTING    Vernell Leep, MD  [place], MD  [place], MD  [place], MD  [place], [place]  cc:  ------------------------------------------------------------------- LV EF: 50% -   55%  ------------------------------------------------------------------- Indications:      Chest pain 786.51.  ------------------------------------------------------------------- History:   Risk factors:  Obstructive sleep apnea. Hypertension. Diabetes mellitus.  ------------------------------------------------------------------- Study Conclusions  - Left ventricle: The cavity size was normal. Wall thickness was   normal. Systolic function was normal. The estimated ejection   fraction was in the range of 50% to 55%. Mild hypokinesis of the   apical myocardium. No  thrombus seen on contrast study. Left   ventricular diastolic function parameters were normal. - Aortic valve: Mildly calcified annulus. Mildly thickened   leaflets. No significant stenosis or regurgitation. - Mitral valve: Mildly calcified annulus. No significant stenosis   or regurgitation.  ------------------------------------------------------------------- Study data:  No prior study was available for comparison.  Study status:  Routine.  Procedure:  The patient&'s pain level was 2 on a scale of 1 to 10. Transthoracic echocardiography. Image quality was adequate. Intravenous contrast (Definity) was administered.  Study completion:  There were no complications.          Transthoracic echocardiography.  M-mode, complete 2D, spectral Doppler, and color Doppler.  Birthdate:  Patient birthdate: 09/20/1960.  Age:  Patient is 57 yr old.  Sex:  Gender: male.    BMI: 30.6  kg/m^2.  Blood pressure:     136/94  Patient status:  Inpatient.  Study date: Study date: 06/22/2018. Study time: 02:15 PM.  Location:  ICU/CCU   -------------------------------------------------------------------  ------------------------------------------------------------------- Left ventricle:  The cavity size was normal. Wall thickness was normal. Systolic function was normal. The estimated ejection fraction was in the range of 50% to 55%.  Regional wall motion abnormalities:  Mild hypokinesis of the apical myocardium. No thrombus seen on contrast study. The transmitral flow pattern was normal. The deceleration time of the early transmitral flow velocity was normal. The pulmonary vein flow pattern was normal. The tissue Doppler parameters were normal. Left ventricular diastolic function parameters were normal.  ------------------------------------------------------------------- Aortic valve:   Mildly calcified annulus. Mildly thickened leaflets.  Doppler:  There was no significant regurgitation.   ------------------------------------------------------------------- Mitral valve:  Mildly calcified annulus. No significant stenosis or regurgitation.  Doppler:  There was no significant regurgitation.  Peak gradient (D): 2 mm Hg.  ------------------------------------------------------------------- Left atrium:  The atrium was normal in size.  ------------------------------------------------------------------- Right ventricle:  The cavity size was normal. Systolic function was normal.  ------------------------------------------------------------------- Pulmonic valve:    The valve appears to be grossly normal. Doppler:  There was no significant regurgitation.  ------------------------------------------------------------------- Tricuspid valve:   The valve appears to be grossly normal. Doppler:  There was no significant  regurgitation.  ------------------------------------------------------------------- Right atrium:  The atrium was normal in size.  ------------------------------------------------------------------- Pericardium:  The pericardium was normal in appearance.  ------------------------------------------------------------------- Measurements   Left ventricle                         Value        Reference  LV ID, ED, PLAX chordal                50    mm     43 - 52  LV ID, ES, PLAX chordal                37    mm     23 - 38  LV fx shortening, PLAX chordal (L)     26    %      >=29  LV PW thickness, ED                    11    mm     ----------  IVS/LV PW ratio, ED                    1            <=1.3  Stroke volume, 2D  84    ml     ----------  Stroke volume/bsa, 2D                  35    ml/m^2 ----------  LV e&', lateral                         9.57  cm/s   ----------  LV E/e&', lateral                       7.81         ----------  LV e&', medial                          8.05  cm/s   ----------  LV E/e&', medial                        9.28         ----------  LV e&', average                         8.81  cm/s   ----------  LV E/e&', average                       8.48         ----------    Ventricular septum                     Value        Reference  IVS thickness, ED                      11    mm     ----------    LVOT                                   Value        Reference  LVOT ID, S                             24    mm     ----------  LVOT area                              4.52  cm^2   ----------  LVOT peak velocity, S                  106   cm/s   ----------  LVOT mean velocity, S                  75.7  cm/s   ----------  LVOT VTI, S                            18.6  cm     ----------    Aorta                                  Value        Reference  Aortic root ID, ED  35    mm     ----------    Left atrium                             Value        Reference  LA ID, A-P, ES                         40    mm     ----------  LA ID/bsa, A-P                         1.65  cm/m^2 <=2.2  LA volume, S                           40.3  ml     ----------  LA volume/bsa, S                       16.6  ml/m^2 ----------  LA volume, ES, 1-p A4C                 35.9  ml     ----------  LA volume/bsa, ES, 1-p A4C             14.8  ml/m^2 ----------  LA volume, ES, 1-p A2C                 42    ml     ----------  LA volume/bsa, ES, 1-p A2C             17.3  ml/m^2 ----------    Mitral valve                           Value        Reference  Mitral E-wave peak velocity            74.7  cm/s   ----------  Mitral A-wave peak velocity            105   cm/s   ----------  Mitral deceleration time               192   ms     150 - 230  Mitral peak gradient, D                2     mm Hg  ----------  Mitral E/A ratio, peak                 0.7          ----------    Right atrium                           Value        Reference  RA ID, S-I, ES, A4C                    44.2  mm     34 - 49  RA area, ES, A4C                       10.8  cm^2   8.3 - 19.5  RA volume, ES, A/L  22.1  ml     ----------  RA volume/bsa, ES, A/L                 9.1   ml/m^2 ----------    Systemic veins                         Value        Reference  Estimated CVP                          3     mm Hg  ----------    Right ventricle                        Value        Reference  TAPSE                                  17.8  mm     ----------  RV s&', lateral, S                      12.6  cm/s   ----------  Legend: (L)  and  (H)  mark values outside specified reference range.  ------------------------------------------------------------------- Prepared and Electronically Authenticated by  Vernell Leep, MD 2019-11-24T16:28:07     LEFT HEART CATH AND CORONARY ANGIOGRAPHY  Conclusion   LM: Normal LAD: Prox ectatic areas with 40% stenoses           Mid to distal LAD is likely the culprit vessel. Mid 100% occlusion, with recanalization.Distal apical LAD is                completely occluded and receives faint collaterals from septal perforators, and distal RPDA. TIMI 1 flow in distal          LAD, TIMI 0 flow in distal apical LAD LCx: Large OM 1 with proxiaml 50% and mid to distal 80% stenosis         Medium sized OM2 completely occluded with collaterals from OM1.  RCA: Dominant. Prox 40%, mid 80%, and ostial RPDA 95% stenoses. RPDA gives faint collateral to apical distal   LVEF 50-55% with apical hypokinesis.   Recommendation: If patient could be a candidate for LIMA-LAD bypass graft, he may best benefit from surgical multivessel revascularization. However, if LAD is deemed not to have a surgical target, it may be best to attempt revascularization to the LAD percutaneously, in addition to revascularization to OM2 and RCA. Continue aspirin/heparin. Recommend metoprolol, lipitor. Hold P2Y12 inhibitor for now.  I discussed the case with CT surgeon Dr. Roxy Manns.  Nigel Mormon, MD [facility] Pager: [phone] Office: [phone] If no answer Cell [phone]    Indications   Acute coronary syndrome ([place]) [I24.9 (ICD-10-CM)]  Procedural Details/Technique   Technical Details Procedures: 1. Selective left and right coronary angiography 2. Left heart catheterization  Indication: Acute coronary syndrome  History: 57 y/o male with hypertension, uncontrolled type 2 DM, OSA, presented to [facility] with complaints of chest pain. Serial EKG's show mild diffuse ST elevation in multiple leads without reciprocal changes. While EKG does not meet STEMI criteria, given ongoing chest pain with trop elevation to 12 ng/mL, recommended to undergo urgent coronary angiography and possible intervention.   Diagnostic Angiography: Catheter/s advances over guidewire under fluoroscopy 5 Fr  TIG 4.0  5 r JL 3.5 5  Fr Pigtail  Anticoagulation:  6000 units heparin  Total contrast used: 115 cc   Total fluoro time: 4.7 min Air Kerma: 423 mGy  All wires and catheters removed out of the body at the end of the procedure Final angiogram showed no dissection/perforation  Complications: None   Estimated blood loss <50 mL.  During this procedure no sedation was administered.  Complications   Complications documented before study signed (06/22/2018 2:31 PM EST)    LEFT HEART CATH AND CORONARY ANGIOGRAPHY   None Documented by Nigel Mormon, MD 06/22/2018 2:03 PM EST  Time Range: Intraprocedure      Coronary Findings   Diagnostic  Dominance: Right  Left Main  Vessel is small. Vessel is angiographically normal.  Left Anterior Descending  Collaterals  Dist LAD filled by collaterals from RPDA.    Collaterals  Mid LAD filled by collaterals from Mid LAD.    Collaterals  Dist LAD filled by collaterals from 1st Sept.    Prox LAD lesion 40% stenosed  Prox LAD lesion is 40% stenosed.  Mid LAD lesion 100% stenosed  Mid LAD lesion is 100% stenosed.  Dist LAD lesion 100% stenosed  Dist LAD lesion is 100% stenosed.  Left Circumflex  Vessel is large.  First Obtuse Marginal Branch  Vessel is large in size.  Ost 1st Mrg lesion 50% stenosed  Ost 1st Mrg lesion is 50% stenosed.  1st Mrg lesion 80% stenosed  1st Mrg lesion is 80% stenosed.  Second Obtuse Marginal Branch  Vessel is moderate in size.  Collaterals  2nd Mrg filled by collaterals from [place] 2nd Mrg to 2nd Mrg lesion 100% stenosed  Ost 2nd Mrg to 2nd Mrg lesion is 100% stenosed.  Right Coronary Artery  Vessel is moderate in size.  Prox RCA lesion 30% stenosed  Prox RCA lesion is 30% stenosed.  Mid RCA lesion 80% stenosed  Mid RCA lesion is 80% stenosed.  Right Posterior Descending Artery  Vessel is moderate in size.  Ost RPDA lesion 95% stenosed  Ost RPDA lesion is 95% stenosed.  Intervention   No  interventions have been documented.  Wall Motion   Resting               Left Heart   Left Ventricle LV end diastolic pressure is mildly elevated. The left ventricular ejection fraction is 50-55% by visual estimate.  Coronary Diagrams   Diagnostic Diagram       Implants    No implant documentation for this case.  MERGE Images   Show images for CARDIAC CATHETERIZATION   Link to Procedure Log   Procedure Log    Hemo Data    Most Recent Value  AO Systolic Pressure 254 mmHg  AO Diastolic Pressure 80 mmHg  AO Mean 97 mmHg  LV Systolic Pressure 270 mmHg  LV Diastolic Pressure 0 mmHg  LV EDP 16 mmHg  AOp Systolic Pressure 623 mmHg  AOp Diastolic Pressure 80 mmHg  AOp Mean Pressure 98 mmHg  LVp Systolic Pressure 762 mmHg  LVp Diastolic Pressure 12 mmHg  LVp EDP Pressure 20 mmHg      Impression:  Patient has severe three-vessel coronary artery disease and was admitted to the hospital with delayed presentation of acute anterior apical myocardial infarction.  The patient has remained pain-free since his initial presentation to the emergency department in [facility].  I have personally reviewed the patient's diagnostic cardiac  catheterization and transthoracic echocardiogram performed this afternoon.  The patient has diffuse coronary artery disease with findings consistent with long-standing poorly controlled diabetes.  There is high-grade subtotal occlusion of the mid to distal portion of the left anterior descending coronary artery.  There is high-grade proximal stenosis of the first diagonal branch coronary artery.  There is high-grade stenosis of the first obtuse marginal branch of the left circumflex coronary artery and 100% occlusion of the second obtuse marginal branch.  There is 80% stenosis of the mid right coronary artery with 99% stenosis of the proximal posterior descending coronary artery.  Although the patient has diffuse coronary artery disease and may have  relatively poor target vessels for grafting, I believe that there are adequate target vessels in all 3 major vascular distributions.  There is mild left ventricular systolic dysfunction with hypokinesis of the distal anterior wall and apex.  I feel the patient would best be treated with surgical revascularization.    Plan:  I have reviewed the indications, risks, and potential benefits of coronary artery bypass grafting with the patient, his wife and his son at the bedside.  Alternative treatment strategies have been discussed, including the relative risks, benefits and long term prognosis associated with medical therapy, percutaneous coronary intervention, and surgical revascularization.  The patient understands and accepts all potential associated risks of surgery including but not limited to risk of death, stroke or other neurologic complication, myocardial infarction, congestive heart failure, respiratory failure, renal failure, bleeding requiring blood transfusion and/or reexploration, aortic dissection or other major vascular complication, arrhythmia, heart block or bradycardia requiring permanent pacemaker, pneumonia, pleural effusion, wound infection, pulmonary embolus or other thromboembolic complication, chronic pain or other delayed complications related to median sternotomy, or the late recurrence of symptomatic ischemic heart disease and/or congestive heart failure.  The importance of long term risk modification have been emphasized.  All questions answered.  We tentatively plan for surgery on Wednesday, June 25, 2018.   I spent in excess of 120 minutes during the conduct of this hospital consultation and >50% of this time involved direct face-to-face encounter for counseling and/or coordination of the patient's care.    Valentina Gu. Roxy Manns, MD 06/22/2018 4:40 PM

## 2018-06-23 ENCOUNTER — Other Ambulatory Visit: Payer: Self-pay | Admitting: *Deleted

## 2018-06-23 ENCOUNTER — Inpatient Hospital Stay (HOSPITAL_COMMUNITY): Payer: 59

## 2018-06-23 ENCOUNTER — Encounter (HOSPITAL_COMMUNITY): Payer: Self-pay | Admitting: Cardiology

## 2018-06-23 DIAGNOSIS — Z01810 Encounter for preprocedural cardiovascular examination: Secondary | ICD-10-CM

## 2018-06-23 DIAGNOSIS — I2510 Atherosclerotic heart disease of native coronary artery without angina pectoris: Secondary | ICD-10-CM

## 2018-06-23 LAB — COMPREHENSIVE METABOLIC PANEL
ALBUMIN: 2.7 g/dL — AB (ref 3.5–5.0)
ALT: 20 U/L (ref 0–44)
ANION GAP: 10 (ref 5–15)
AST: 27 U/L (ref 15–41)
Alkaline Phosphatase: 49 U/L (ref 38–126)
BUN: 22 mg/dL — ABNORMAL HIGH (ref 6–20)
CO2: 24 mmol/L (ref 22–32)
Calcium: 7.8 mg/dL — ABNORMAL LOW (ref 8.9–10.3)
Chloride: 98 mmol/L (ref 98–111)
Creatinine, Ser: 1.19 mg/dL (ref 0.61–1.24)
GFR calc non Af Amer: >60 mL/min (ref >60)
GLUCOSE: 252 mg/dL — AB (ref 70–99)
POTASSIUM: 3.3 mmol/L — AB (ref 3.5–5.1)
SODIUM: 132 mmol/L — AB (ref 135–145)
TOTAL PROTEIN: 6 g/dL — AB (ref 6.5–8.1)
Total Bilirubin: 1.1 mg/dL (ref 0.3–1.2)

## 2018-06-23 LAB — CBC WITH DIFFERENTIAL/PLATELET
Abs Immature Granulocytes: 0.05 10*3/uL (ref 0.00–0.07)
Basophils Absolute: 0.1 10*3/uL (ref 0.0–0.1)
Basophils Relative: 0 %
EOS ABS: 0 10*3/uL (ref 0.0–0.5)
EOS PCT: 0 %
HEMATOCRIT: 38.5 % — AB (ref 39.0–52.0)
Hemoglobin: 13.1 g/dL (ref 13.0–17.0)
Immature Granulocytes: 0 %
LYMPHS ABS: 2.1 10*3/uL (ref 0.7–4.0)
Lymphocytes Relative: 18 %
MCH: 30.3 pg (ref 26.0–34.0)
MCHC: 34 g/dL (ref 30.0–36.0)
MCV: 89.1 fL (ref 80.0–100.0)
MONOS PCT: 12 %
Monocytes Absolute: 1.4 10*3/uL — ABNORMAL HIGH (ref 0.1–1.0)
Neutro Abs: 8.1 10*3/uL — ABNORMAL HIGH (ref 1.7–7.7)
Neutrophils Relative %: 70 %
Platelets: 248 10*3/uL (ref 150–400)
RBC: 4.32 MIL/uL (ref 4.22–5.81)
RDW: 11.4 % — AB (ref 11.5–15.5)
WBC: 11.7 10*3/uL — ABNORMAL HIGH (ref 4.0–10.5)
nRBC: 0 % (ref 0.0–0.2)

## 2018-06-23 LAB — HEMOGLOBIN A1C
HEMOGLOBIN A1C: 8.2 % — AB (ref 4.8–5.6)
Mean Plasma Glucose: 188.64 mg/dL

## 2018-06-23 LAB — LIPID PANEL
Cholesterol: 120 mg/dL (ref 0–200)
HDL: 32 mg/dL — ABNORMAL LOW (ref >40)
LDL CALC: 69 mg/dL (ref 0–99)
Total CHOL/HDL Ratio: 3.8 RATIO
Triglycerides: 94 mg/dL (ref <150)
VLDL: 19 mg/dL (ref 0–40)

## 2018-06-23 LAB — POCT I-STAT 3, ART BLOOD GAS (G3+)
Acid-Base Excess: 1 mmol/L (ref 0.0–2.0)
BICARBONATE: 24.7 mmol/L (ref 20.0–28.0)
O2 SAT: 93 %
PCO2 ART: 36.1 mmHg (ref 32.0–48.0)
Patient temperature: 98.6
TCO2: 26 mmol/L (ref 22–32)
pH, Arterial: 7.444 (ref 7.350–7.450)
pO2, Arterial: 63 mmHg — ABNORMAL LOW (ref 83.0–108.0)

## 2018-06-23 LAB — GLUCOSE, CAPILLARY
GLUCOSE-CAPILLARY: 218 mg/dL — AB (ref 70–99)
GLUCOSE-CAPILLARY: 248 mg/dL — AB (ref 70–99)
GLUCOSE-CAPILLARY: 219 mg/dL — AB (ref 70–99)
GLUCOSE-CAPILLARY: 168 mg/dL — AB (ref 70–99)
Glucose-Capillary: 213 mg/dL — ABNORMAL HIGH (ref 70–99)

## 2018-06-23 LAB — URINALYSIS, COMPLETE (UACMP) WITH MICROSCOPIC
BILIRUBIN URINE: NEGATIVE
Bacteria, UA: NONE SEEN
GLUCOSE, UA: 150 mg/dL — AB
HGB URINE DIPSTICK: NEGATIVE
Ketones, ur: 20 mg/dL — AB
Leukocytes, UA: NEGATIVE
NITRITE: NEGATIVE
PH: 5 (ref 5.0–8.0)
Protein, ur: NEGATIVE mg/dL
Specific Gravity, Urine: 1.027 (ref 1.005–1.030)

## 2018-06-23 LAB — TROPONIN I: TROPONIN I: 8.07 ng/mL — AB (ref <0.03)

## 2018-06-23 LAB — ABO/RH: ABO/RH(D): O POS

## 2018-06-23 LAB — HIV ANTIBODY (ROUTINE TESTING W REFLEX): HIV Screen 4th Generation wRfx: NONREACTIVE

## 2018-06-23 LAB — PROTIME-INR
INR: 1.25
PROTHROMBIN TIME: 15.6 s — AB (ref 11.4–15.2)

## 2018-06-23 LAB — APTT: aPTT: >200 seconds (ref 24–36)

## 2018-06-23 LAB — HEPARIN LEVEL (UNFRACTIONATED): HEPARIN UNFRACTIONATED: 1.32 [IU]/mL — AB (ref 0.30–0.70)

## 2018-06-23 LAB — BRAIN NATRIURETIC PEPTIDE: B Natriuretic Peptide: 73 pg/mL (ref 0.0–100.0)

## 2018-06-23 LAB — TYPE AND SCREEN
ABO/RH(D): O POS
ANTIBODY SCREEN: NEGATIVE

## 2018-06-23 MED ORDER — INFLUENZA VAC SPLIT QUAD 0.5 ML IM SUSY: 0.5000 mL | PREFILLED_SYRINGE | INTRAMUSCULAR | Status: DC

## 2018-06-23 MED ORDER — HEPARIN (PORCINE) 25000 UT/250ML-% IV SOLN
2200.0000 [IU]/h | INTRAVENOUS | Status: DC
  Administered 2018-06-23 (×2): 1100 [IU]/h via INTRAVENOUS
  Filled 2018-06-23 (×3): qty 250

## 2018-06-23 MED ORDER — POTASSIUM CHLORIDE CRYS ER 20 MEQ PO TBCR
40.0000 meq | EXTENDED_RELEASE_TABLET | Freq: Once | ORAL | Status: AC
  Administered 2018-06-23: 40 meq via ORAL
  Filled 2018-06-23: qty 2

## 2018-06-23 MED FILL — Heparin Sod (Porcine)-NaCl IV Soln 1000 Unit/500ML-0.9%: INTRAVENOUS | Qty: 500 | Status: AC

## 2018-06-23 MED FILL — Verapamil HCl IV Soln 2.5 MG/ML: INTRAVENOUS | Qty: 2 | Status: AC

## 2018-06-23 NOTE — Progress Notes (Signed)
CRITICAL VALUE ALERT  Critical Value: APTT >200  Date & Time Notied: 11/25 0550  Provider Notified: Cardiology Fellow   Orders Received/Actions taken: Pharmacy placed orders.

## 2018-06-23 NOTE — Progress Notes (Signed)
[facility] BRIEF SICU PROGRESS NOTE  1 Day Post-Op  S/P Procedure(s) (LRB): LEFT HEART CATH AND CORONARY ANGIOGRAPHY (N/A)   Clinically stable Complains that everyone keeps waking him up Minimal chest discomfort, no SOB Exam unchanged  Plan: For OR Wednesday   Rexene Alberts, MD 06/23/2018 9:29 AM

## 2018-06-23 NOTE — Progress Notes (Signed)
Pre-op Cardiac Surgery  Carotid Findings:  Bilateral ICAs 1-39% stenosis. Bilateral vertebral arteries are patent with antegrade flow.  Upper Extremity Right Left  Brachial Pressures 130 131  Radial Waveforms Biphasic Biphasic  Ulnar Waveforms Triphasic Triphasic  Palmar Arch (Allen's Test) See below See below   Findings:   Right Upper Extremity: Doppler waveforms remain within normal limits with right radial compression. Doppler waveforms decrease 50% with right ulnar compression.  Left Upper Extremity: Doppler waveforms remain within normal limits with left radial compression. Doppler waveforms remain within normal limits with left ulnar compression.   Lower  Extremity Right Left  Dorsalis Pedis 152 Triphasic 168  Triphasic  Posterior Tibial 166 Triphasic 174  Triphasic  Ankle/Brachial Indices 1.27 1.33   Findings:   Right ABI: Resting right ankle-brachial index is within normal range. No evidence of significant right lower extremity arterial disease.  Left ABI: Resting left ankle-brachial index indicates noncompressible left lower extremity arteries.  Normand Damron H Farryn Linares(RDMS RVT) 06/23/18 1:24 PM

## 2018-06-23 NOTE — Progress Notes (Signed)
Inpatient Diabetes Program Recommendations  AACE/ADA: New Consensus Statement on Inpatient Glycemic Control (2015)  Target Ranges:  Prepandial:   less than 140 mg/dL      Peak postprandial:   less than 180 mg/dL (1-2 hours)      Critically ill patients:  140 - 180 mg/dL   Lab Results  Component Value Date   GLUCAP 218 (H) 06/23/2018   HGBA1C 8.2 (H) 06/23/2018    Review of Glycemic Control Results for JASSIAH, VIVIANO (MRN [id number]) as of 06/23/2018 10:42  Ref. Range 06/22/2018 15:52 06/22/2018 22:03 06/23/2018 06:28 06/23/2018 07:48  Glucose-Capillary Latest Ref Range: 70 - 99 mg/dL 259 (H) 196 (H) 213 (H) 218 (H)   Diabetes history: DM2 Outpatient Diabetes medications:  Metformin 1000 mg q AM Current orders for Inpatient glycemic control:  Novolog 4 units tid with meals, Novolog moderate tid with meals and HS  Inpatient Diabetes Program Recommendations:   Please add Levemir 20 units daily while patient is in the hospital.   Thanks,  Adah Perl, RN, BC-ADM Inpatient Diabetes Coordinator Pager [phone] (8a-5p)

## 2018-06-23 NOTE — Progress Notes (Addendum)
[place] for Heparin Indication: chest pain/ACS, s/p cath  Allergies  Allergen Reactions  . Other     Fruits with a pit. Throat swells up. Can have them if cooked     Patient Measurements: Height: 6' 2.5" (189.2 cm) Weight: 241 lb 2.9 oz (109.4 kg) IBW/kg (Calculated) : 83.35 Heparin Dosing Weight: 105kg  Vital Signs: Temp: 99.1 F (37.3 C) (11/25 0000) Temp Source: Oral (11/25 0000) BP: 101/56 (11/25 0500) Pulse Rate: 92 (11/24 2200)  Labs: Recent Labs    06/22/18 1003 06/22/18 1815 06/23/18 0415  HGB 16.1  --  13.1  HCT 48.3  --  38.5*  PLT 258  --  248  LABPROT  --   --  15.6*  INR  --   --  1.25  HEPARINUNFRC  --   --  1.32*  CREATININE 0.97  --  1.19  TROPONINI 12.60* 9.09* 8.07*    Estimated Creatinine Clearance: 93.1 mL/min (by C-G formula based on SCr of 1.19 mg/dL).   Medical History: Past Medical History:  Diagnosis Date  . Acute myocardial infarction ([place]) 06/22/2018  . Allergy   . Charcot's joint of left foot, non-diabetic   . Coronary artery disease involving native coronary artery with unstable angina pectoris ([place]) 06/22/2018  . Diabetes ([place])    type 2  . Hyperlipidemia   . Hypertension   . Neuropathy of left foot   . Obstructive sleep apnea   . Right foot ulcer ([place])   . Type II diabetes mellitus with complication, uncontrolled ([place])   . Uncontrolled type 2 diabetes mellitus ([name]     Medications:  Scheduled:  . aspirin  324 mg Oral NOW   Or  . aspirin  300 mg Rectal NOW  . aspirin EC  81 mg Oral Daily  . atorvastatin  80 mg Oral q1800  . Chlorhexidine Gluconate Cloth  6 each Topical Q0600  . hydrochlorothiazide  25 mg Oral Daily  . insulin aspart  0-15 Units Subcutaneous TID WC  . insulin aspart  0-5 Units Subcutaneous QHS  . insulin aspart  4 Units Subcutaneous TID WC  . metoprolol tartrate  25 mg Oral BID  . mupirocin ointment  1 application Nasal BID    Assessment: 82 YOM  presenting with CP to MCHP, elevated troponin and pharmacy consulted to start heparin, no AC PTA and CBC wnl.    Patient s/p urgent cath, found to have multivessel disease and cvts consulted and evaluated in lab. No P2y12 given, will restart heparin tonight  11/25 AM update: pt on tirofiban infusion, heparin level is elevated at 1.32, was drawn on same arm due to restricted access but not close to infusion site and RN paused for a few minutes  Goal of Therapy:  Heparin level 0.3-0.5 units/mL while on tirofiban Monitor platelets by anticoagulation protocol: Yes   Plan:  Hold heparin x 1 hr Re-start heparin drip at 1100 units/hr at 0645 Re-check heparin level in 6-8 hours  Narda Bonds, PharmD, [place] Pharmacist Phone: [phone]

## 2018-06-23 NOTE — Progress Notes (Signed)
Notified Cardiology fellow of pts potassium level this morning of 3.3. Verbal order to administer 40 meq of potassium PO.  RN will continue to monitor pt.

## 2018-06-23 NOTE — Progress Notes (Signed)
Subjective:  Mild chest pain. Remains on nitroglycerin drip  Objective:  Vital Signs in the last 24 hours: Temp:  [98.9 F (37.2 C)-99.5 F (37.5 C)] 99.3 F (37.4 C) (11/25 1148) Pulse Rate:  [92-106] 92 (11/24 2200) Resp:  [18-33] 19 (11/25 1600) BP: (101-134)/(56-90) 133/86 (11/25 1200) SpO2:  [91 %-97 %] 95 % (11/25 1600)  Intake/Output from previous day: 11/24 0701 - 11/25 0700 In: 902 [P.O.:360; I.V.:542] Out: 325 [Urine:325] Intake/Output from this shift: Total I/O In: 270.4 [I.V.:270.4] Out: 350 [Urine:350]  Physical Exam: Nursing note and vitals reviewed. Constitutional: He is oriented to person, place, and time. He appears well-developed and well-nourished. No distress.  HENT:  Head: Normocephalic and atraumatic.  Eyes: Pupils are equal, round, and reactive to light. Conjunctivae are normal.  Neck: No JVD present.  Cardiovascular: Normal rate, regular rhythm, normal heart sounds and intact distal pulses.  No murmur heard. Pulses:      Dorsalis pedis pulses are 2+ on the right side, and 2+ on the left side.       Posterior tibial pulses are 2+ on the right side, and 2+ on the left side.  Respiratory: Effort normal and breath sounds normal. He has no wheezes. He has no rales.  GI: Soft. Bowel sounds are normal. There is no rebound.  Musculoskeletal: He exhibits no edema.  Lymphadenopathy:    He has no cervical adenopathy.  Neurological: He is alert and oriented to person, place, and time.  Skin: Skin is warm and dry.  Deep trophic ulcer on the ball of right foot. No erythema, purulent discharge.  Psychiatric: He has a normal mood and affect.   Lab Results: Recent Labs    06/22/18 1003 06/23/18 0415  WBC 15.4* 11.7*  HGB 16.1 13.1  PLT 258 248   Recent Labs    06/22/18 1003 06/23/18 0415  NA 129* 132*  K 4.0 3.3*  CL 92* 98  CO2 27 24  GLUCOSE 295* 252*  BUN 17 22*  CREATININE 0.97 1.19   Recent Labs    06/22/18 1815 06/23/18 0415   TROPONINI 9.09* 8.07*   Hepatic Function Panel Recent Labs    06/23/18 0415  PROT 6.0*  ALBUMIN 2.7*  AST 27  ALT 20  ALKPHOS 49  BILITOT 1.1   Recent Labs    06/23/18 0415  CHOL 120   Cardiac studies:   EKG 06/22/2018: Sinus tachycardia 103 bpm, left axis deviation.  Diffuse ST elevation in anterolateral leads .  No reciprocal changes seen.  Possible acute lateral infarct.  Echocardiogram 06/22/2018: Study Conclusions  - Left ventricle: The cavity size was normal. Wall thickness was normal. Systolic function was normal. The estimated ejection fraction was in the range of 50% to 55%. Mild hypokinesis of the apical myocardium. No thrombus seen on contrast study. Left ventricular diastolic function parameters were normal. - Aortic valve: Mildly calcified annulus. Mildly thickened leaflets. No significant stenosis or regurgitation. - Mitral valve: Mildly calcified annulus. No significant stenosis or regurgitation.  Cath 06/22/2018: LM: Normal LAD: Prox ectatic areas with 40% stenoses Mid to distal LAD is likely the culprit vessel. Mid 100% occlusion, with recanalization.Distal apical LAD is  completely occluded and receives faint collaterals from septal perforators, and distal RPDA. TIMI 1 flow in distal LAD, TIMI 0 flow in distal apical LAD LCx: Large OM 1 with proxiaml 50% and mid to distal 80% stenosis Medium sized OM2 completely occluded with collaterals from OM1.  RCA: Dominant. Prox 40%, mid 80%,  and ostial RPDA 95% stenoses. RPDA gives faint collateral to apical distal   LVEF 50-55% with apical hypokinesis.    Assessment/Plan:  57 year old Caucasian male with uncontrolled type II diabetes mellitus, hypertension, nonhealing trophic ulcer right sole, now with acute coronary syndrome  Acute coronary syndrome: Mild ST elevation in multiple leads. Likely late presentation of anteroalteral infarct with  partial recanalization of LAD.Severe multivessel CAD, as detailed above. Given patient's absence of chest pain at the end of the cardiac catheterization, I decided to not commit him to percutaneous revascularization.  We discussed surgical versus percutaneous revascularization. Seen by Dr. Roxy Manns. Patient will undergo CABG on 06/25/2018.  Continue aspirin, heparin for now. Discontinue tirofiban.Continue nitroglycerin drip.  Continue metoprolol, lipitor. Will add ACEi on discharge  Unfontrolled type 2 DM: A1C 11%. Only on metformin at basline. WIll hold metofrmin while inpatient. Insulin coverage  Nonhealing Rt sole ulcer: Intact distal pulses. Ulcer is trophic with no acute infection. Podiatry, wound consult.   I discussed the plan with patient's wife Kyreese Chio and family, at length.      LOS: 1 day     J  06/23/2018, 4:59 PM   Esther Hardy, MD [facility] [place] Pager: [phone] Office: [phone] If no answer Cell [phone]

## 2018-06-23 NOTE — Plan of Care (Signed)
  Problem: Clinical Measurements: Goal: Ability to maintain clinical measurements within normal limits will improve Outcome: Progressing Goal: Will remain free from infection Outcome: Progressing Goal: Respiratory complications will improve Outcome: Progressing   Problem: Activity: Goal: Risk for activity intolerance will decrease Outcome: Progressing   Problem: Coping: Goal: Level of anxiety will decrease Outcome: Progressing   Problem: Elimination: Goal: Will not experience complications related to urinary retention Outcome: Progressing   Problem: Pain Managment: Goal: General experience of comfort will improve Outcome: Progressing   Problem: Safety: Goal: Ability to remain free from injury will improve Outcome: Progressing

## 2018-06-23 NOTE — Progress Notes (Signed)
CARDIAC REHAB PHASE I   PRE:  Rate/Rhythm: 98 SR    BP: sitting 117/80    SaO2: 93 RA  MODE:  Ambulation: 740 ft   POST:  Rate/Rhythm: 106 ST with PVC    BP: sitting 108/73     SaO2: 93 RA  Tolerated well, no c/o. Thankful to walk. Pt also practiced getting out of bed with sternal precautions. Able to do 2200 mL on IS. Discussed sternal precautions, mobility, d/c planning with pt and wife. Good reception.  [place], ACSM 06/23/2018 2:05 PM

## 2018-06-23 NOTE — Progress Notes (Signed)
[place] for Heparin Indication: multivessel CAD  Allergies  Allergen Reactions  . Other     Fruits with a pit. Throat swells up. Can have them if cooked     Patient Measurements: Height: 6' 2.5" (189.2 cm) Weight: 241 lb 2.9 oz (109.4 kg) IBW/kg (Calculated) : 83.35 Heparin Dosing Weight: 105kg  Vital Signs: Temp: 99.3 F (37.4 C) (11/25 1148) Temp Source: Oral (11/25 1148) BP: 127/89 (11/25 1700)  Labs: Recent Labs    06/22/18 1003 06/22/18 1815 06/23/18 0415 06/23/18 1548  HGB 16.1  --  13.1  --   HCT 48.3  --  38.5*  --   PLT 258  --  248  --   APTT  --   --  >200*  --   LABPROT  --   --  15.6*  --   INR  --   --  1.25  --   HEPARINUNFRC  --   --  1.32* <0.10*  CREATININE 0.97  --  1.19  --   TROPONINI 12.60* 9.09* 8.07*  --     Estimated Creatinine Clearance: 93.1 mL/min (by C-G formula based on SCr of 1.19 mg/dL).  Assessment: 46 YOM on heparin for multivessel disease. Plan for CABG 11/27. Pt now off tirofiban.  Noted that a.m. heparin level which was elevated was drawn from [facility] and heparin is running in wrist so likely falsely elevated due to this.   Heparin level now undetectable on gtt at 1100 units/hr. No issues with line or infusion per RN. No bleeding noted.  Goal of Therapy:  Heparin level 0.3-0.7 units/ml Monitor platelets by anticoagulation protocol: Yes   Plan:  Increase heparin to 1400 units/hr Will f/u 6 hr heparin level  Sherlon Handing, PharmD, BCPS Clinical pharmacist  **Pharmacist phone directory can now be found on [email or web address] ([email or web address]).  Listed under [place]. 06/23/2018 5:48 PM

## 2018-06-24 ENCOUNTER — Encounter (HOSPITAL_COMMUNITY): Payer: Self-pay | Admitting: *Deleted

## 2018-06-24 ENCOUNTER — Encounter (HOSPITAL_COMMUNITY): Payer: Self-pay | Admitting: Certified Registered Nurse Anesthetist

## 2018-06-24 ENCOUNTER — Inpatient Hospital Stay (HOSPITAL_COMMUNITY): Payer: 59

## 2018-06-24 ENCOUNTER — Telehealth: Payer: Self-pay | Admitting: *Deleted

## 2018-06-24 LAB — BASIC METABOLIC PANEL
ANION GAP: 12 (ref 5–15)
BUN: 17 mg/dL (ref 6–20)
CO2: 24 mmol/L (ref 22–32)
Calcium: 8.6 mg/dL — ABNORMAL LOW (ref 8.9–10.3)
Chloride: 96 mmol/L — ABNORMAL LOW (ref 98–111)
Creatinine, Ser: 1.1 mg/dL (ref 0.61–1.24)
Glucose, Bld: 205 mg/dL — ABNORMAL HIGH (ref 70–99)
POTASSIUM: 3.6 mmol/L (ref 3.5–5.1)
SODIUM: 132 mmol/L — AB (ref 135–145)

## 2018-06-24 LAB — PULMONARY FUNCTION TEST
FEF 25-75 Pre: 3.12 L/sec
FEF2575-%Pred-Pre: 85 %
FEV1-%Pred-Pre: 77 %
FEV1-Pre: 3.38 L
FEV1FVC-%Pred-Pre: 103 %
FEV6-%Pred-Pre: 76 %
FEV6-Pre: 4.18 L
FEV6FVC-%Pred-Pre: 103 %
FVC-%Pred-Pre: 74 %
FVC-Pre: 4.23 L
Pre FEV1/FVC ratio: 80 %
Pre FEV6/FVC Ratio: 100 %

## 2018-06-24 LAB — CBC
HCT: 40.6 % (ref 39.0–52.0)
HEMOGLOBIN: 13.3 g/dL (ref 13.0–17.0)
MCH: 29.4 pg (ref 26.0–34.0)
MCHC: 32.8 g/dL (ref 30.0–36.0)
MCV: 89.6 fL (ref 80.0–100.0)
NRBC: 0 % (ref 0.0–0.2)
PLATELETS: 288 10*3/uL (ref 150–400)
RBC: 4.53 MIL/uL (ref 4.22–5.81)
RDW: 11.4 % — ABNORMAL LOW (ref 11.5–15.5)
WBC: 12.4 10*3/uL — AB (ref 4.0–10.5)

## 2018-06-24 LAB — GLUCOSE, CAPILLARY
GLUCOSE-CAPILLARY: 166 mg/dL — AB (ref 70–99)
Glucose-Capillary: 243 mg/dL — ABNORMAL HIGH (ref 70–99)
Glucose-Capillary: 199 mg/dL — ABNORMAL HIGH (ref 70–99)
Glucose-Capillary: 201 mg/dL — ABNORMAL HIGH (ref 70–99)

## 2018-06-24 LAB — HEPARIN LEVEL (UNFRACTIONATED)
Heparin Unfractionated: <0.1 IU/mL — ABNORMAL LOW (ref 0.30–0.70)
Heparin Unfractionated: <0.1 IU/mL — ABNORMAL LOW (ref 0.30–0.70)

## 2018-06-24 MED ORDER — POTASSIUM CHLORIDE 2 MEQ/ML IV SOLN
80.0000 meq | INTRAVENOUS | Status: DC
  Filled 2018-06-24: qty 40

## 2018-06-24 MED ORDER — PHENYLEPHRINE HCL-NACL 20-0.9 MG/250ML-% IV SOLN
30.0000 ug/min | INTRAVENOUS | Status: DC
  Filled 2018-06-24: qty 250

## 2018-06-24 MED ORDER — TRANEXAMIC ACID (OHS) PUMP PRIME SOLUTION
2.0000 mg/kg | INTRAVENOUS | Status: DC
  Filled 2018-06-24: qty 2.19

## 2018-06-24 MED ORDER — EPINEPHRINE PF 1 MG/ML IJ SOLN
0.0000 ug/min | INTRAVENOUS | Status: DC
  Filled 2018-06-24: qty 4

## 2018-06-24 MED ORDER — CHLORHEXIDINE GLUCONATE 4 % EX LIQD
60.0000 mL | Freq: Once | CUTANEOUS | Status: AC
  Administered 2018-06-24: 4 via TOPICAL
  Filled 2018-06-24: qty 60

## 2018-06-24 MED ORDER — TEMAZEPAM 15 MG PO CAPS: 15.0000 mg | ORAL_CAPSULE | Freq: Once | ORAL | Status: DC | PRN

## 2018-06-24 MED ORDER — CHLORHEXIDINE GLUCONATE 4 % EX LIQD
60.0000 mL | Freq: Once | CUTANEOUS | Status: AC
  Administered 2018-06-25: 4 via TOPICAL
  Filled 2018-06-24: qty 30

## 2018-06-24 MED ORDER — PLASMA-LYTE 148 IV SOLN
INTRAVENOUS | Status: AC
  Administered 2018-06-25: 500 mL
  Filled 2018-06-24: qty 2.5

## 2018-06-24 MED ORDER — VANCOMYCIN HCL 10 G IV SOLR
1500.0000 mg | INTRAVENOUS | Status: AC
  Administered 2018-06-25: 1500 mg via INTRAVENOUS
  Filled 2018-06-24: qty 1500

## 2018-06-24 MED ORDER — SODIUM CHLORIDE 0.9 % IV SOLN
INTRAVENOUS | Status: DC
  Filled 2018-06-24: qty 30

## 2018-06-24 MED ORDER — VANCOMYCIN HCL 1000 MG IV SOLR
INTRAVENOUS | Status: AC
  Administered 2018-06-25: 1000 mL
  Filled 2018-06-24: qty 1000

## 2018-06-24 MED ORDER — TRANEXAMIC ACID 1000 MG/10ML IV SOLN
1.5000 mg/kg/h | INTRAVENOUS | Status: DC
  Filled 2018-06-24 (×2): qty 25

## 2018-06-24 MED ORDER — METOPROLOL TARTRATE 12.5 MG HALF TABLET
12.5000 mg | ORAL_TABLET | Freq: Once | ORAL | Status: AC
  Administered 2018-06-25: 12.5 mg via ORAL
  Filled 2018-06-24: qty 1

## 2018-06-24 MED ORDER — DOPAMINE-DEXTROSE 3.2-5 MG/ML-% IV SOLN
0.0000 ug/kg/min | INTRAVENOUS | Status: DC
  Filled 2018-06-24: qty 250

## 2018-06-24 MED ORDER — NITROGLYCERIN IN D5W 200-5 MCG/ML-% IV SOLN
2.0000 ug/min | INTRAVENOUS | Status: AC
  Administered 2018-06-25: 16.6 ug/min via INTRAVENOUS
  Filled 2018-06-24: qty 250

## 2018-06-24 MED ORDER — SODIUM CHLORIDE 0.9 % IV SOLN
750.0000 mg | INTRAVENOUS | Status: DC
  Filled 2018-06-24: qty 750

## 2018-06-24 MED ORDER — CHLORHEXIDINE GLUCONATE 0.12 % MT SOLN
15.0000 mL | Freq: Once | OROMUCOSAL | Status: AC
  Administered 2018-06-25: 15 mL via OROMUCOSAL
  Filled 2018-06-24: qty 15

## 2018-06-24 MED ORDER — DEXMEDETOMIDINE HCL IN NACL 400 MCG/100ML IV SOLN
0.1000 ug/kg/h | INTRAVENOUS | Status: DC
  Filled 2018-06-24: qty 100

## 2018-06-24 MED ORDER — BISACODYL 5 MG PO TBEC: 5.0000 mg | DELAYED_RELEASE_TABLET | Freq: Once | ORAL | Status: DC

## 2018-06-24 MED ORDER — INSULIN REGULAR(HUMAN) IN NACL 100-0.9 UT/100ML-% IV SOLN
INTRAVENOUS | Status: DC
  Filled 2018-06-24: qty 100

## 2018-06-24 MED ORDER — TRANEXAMIC ACID (OHS) BOLUS VIA INFUSION
15.0000 mg/kg | INTRAVENOUS | Status: AC
  Administered 2018-06-25: 1641 mg via INTRAVENOUS
  Filled 2018-06-24: qty 1641

## 2018-06-24 MED ORDER — MAGNESIUM SULFATE 50 % IJ SOLN
40.0000 meq | INTRAMUSCULAR | Status: DC
  Filled 2018-06-24: qty 9.85

## 2018-06-24 MED ORDER — MILRINONE LACTATE IN DEXTROSE 20-5 MG/100ML-% IV SOLN
0.3000 ug/kg/min | INTRAVENOUS | Status: DC
  Filled 2018-06-24: qty 100

## 2018-06-24 MED ORDER — SODIUM CHLORIDE 0.9 % IV SOLN
1.5000 g | INTRAVENOUS | Status: AC
  Administered 2018-06-25: 1.5 g via INTRAVENOUS
  Filled 2018-06-24: qty 1.5

## 2018-06-24 MED ORDER — NOREPINEPHRINE 4 MG/250ML-% IV SOLN
0.0000 ug/min | INTRAVENOUS | Status: DC
  Filled 2018-06-24: qty 250

## 2018-06-24 MED ORDER — POTASSIUM CHLORIDE CRYS ER 20 MEQ PO TBCR
40.0000 meq | EXTENDED_RELEASE_TABLET | Freq: Once | ORAL | Status: AC
  Administered 2018-06-24: 40 meq via ORAL
  Filled 2018-06-24: qty 2

## 2018-06-24 NOTE — Progress Notes (Signed)
[place] for Heparin Indication: chest pain/ACS, s/p cath  Allergies  Allergen Reactions  . Other     Fruits with a pit. Throat swells up. Can have them if cooked     Patient Measurements: Height: 6' 2.5" (189.2 cm) Weight: 241 lb 2.9 oz (109.4 kg) IBW/kg (Calculated) : 83.35 Heparin Dosing Weight: 105kg  Vital Signs: BP: 100/61 (11/25 2200)  Labs: Recent Labs    06/22/18 1003 06/22/18 1815 06/23/18 0415 06/23/18 1548 06/23/18 2356  HGB 16.1  --  13.1  --   --   HCT 48.3  --  38.5*  --   --   PLT 258  --  248  --   --   APTT  --   --  >200*  --   --   LABPROT  --   --  15.6*  --   --   INR  --   --  1.25  --   --   HEPARINUNFRC  --   --  1.32* <0.10* <0.10*  CREATININE 0.97  --  1.19  --   --   TROPONINI 12.60* 9.09* 8.07*  --   --     Estimated Creatinine Clearance: 93.1 mL/min (by C-G formula based on SCr of 1.19 mg/dL).   Medical History: Past Medical History:  Diagnosis Date  . Acute myocardial infarction ([place]) 06/22/2018  . Allergy   . Charcot's joint of left foot, non-diabetic   . Coronary artery disease involving native coronary artery with unstable angina pectoris ([place]) 06/22/2018  . Diabetes ([place])    type 2  . Hyperlipidemia   . Hypertension   . Neuropathy of left foot   . Obstructive sleep apnea   . Right foot ulcer ([place])   . Type II diabetes mellitus with complication, uncontrolled ([place])   . Uncontrolled type 2 diabetes mellitus ([name]     Medications:  Scheduled:  . aspirin EC  81 mg Oral Daily  . atorvastatin  80 mg Oral q1800  . Chlorhexidine Gluconate Cloth  6 each Topical Q0600  . hydrochlorothiazide  25 mg Oral Daily  . Influenza vac split quadrivalent PF  0.5 mL Intramuscular Tomorrow-1000  . insulin aspart  0-15 Units Subcutaneous TID WC  . insulin aspart  0-5 Units Subcutaneous QHS  . insulin aspart  4 Units Subcutaneous TID WC  . metoprolol tartrate  25 mg Oral BID  . mupirocin ointment   1 application Nasal BID    Assessment: 22 YOM presenting with CP to MCHP, elevated troponin and pharmacy consulted to start heparin, no AC PTA and CBC wnl.    Patient s/p urgent cath, found to have multivessel disease and cvts consulted and evaluated in lab. No P2y12 given, will restart heparin tonight  11/26 AM update: heparin level sub-therapeutic, CABG 11/27  Goal of Therapy:  Heparin level 0.3-0.7 units/mL Monitor platelets by anticoagulation protocol: Yes   Plan:  Increase heparin to 1700 units/hr Re-check heparin level at [place], PharmD, [place] Pharmacist Phone: [phone]

## 2018-06-24 NOTE — Progress Notes (Signed)
[place] for Heparin Indication: chest pain/ACS, s/p cath> plan CABG  Allergies  Allergen Reactions  . Apricot Kernel Oil [Prunus] Swelling    THROAT  . Cherry Swelling    THROAT  . Other Other (See Comments)    Fruits with a pit. Throat swells up. Can have them if cooked   . Peach [Prunus Persica] Swelling    THROAT  . Plum Pulp Swelling    THROAT    Patient Measurements: Height: 6' 2.5" (189.2 cm) Weight: 241 lb 2.9 oz (109.4 kg) IBW/kg (Calculated) : 83.35 Heparin Dosing Weight: 105kg  Vital Signs: BP: 127/81 (11/26 1800) Pulse Rate: 98 (11/26 1124)  Labs: Recent Labs    06/22/18 1003 06/22/18 1815 06/23/18 0415  06/23/18 2356 06/24/18 0904 06/24/18 1033 06/24/18 1608  HGB 16.1  --  13.1  --   --   --  13.3  --   HCT 48.3  --  38.5*  --   --   --  40.6  --   PLT 258  --  248  --   --   --  288  --   APTT  --   --  >200*  --   --   --   --   --   LABPROT  --   --  15.6*  --   --   --   --   --   INR  --   --  1.25  --   --   --   --   --   HEPARINUNFRC  --   --  1.32*   < > <0.10* <0.10*  --  <0.10*  CREATININE 0.97  --  1.19  --   --   --  1.10  --   TROPONINI 12.60* 9.09* 8.07*  --   --   --   --   --    < > = values in this interval not displayed.    Estimated Creatinine Clearance: 100.7 mL/min (by C-G formula based on SCr of 1.1 mg/dL).   Medical History: Past Medical History:  Diagnosis Date  . Acute myocardial infarction ([place]) 06/22/2018  . Allergy   . Charcot's joint of left foot, non-diabetic   . Coronary artery disease involving native coronary artery with unstable angina pectoris ([place]) 06/22/2018  . Diabetes ([place])    type 2  . Hyperlipidemia   . Hypertension   . Neuropathy of left foot   . Obstructive sleep apnea   . Right foot ulcer ([place])   . Type II diabetes mellitus with complication, uncontrolled ([place])   . Uncontrolled type 2 diabetes mellitus ([name]     Medications:  Scheduled:  . aspirin  EC  81 mg Oral Daily  . atorvastatin  80 mg Oral q1800  . bisacodyl  5 mg Oral Once  . chlorhexidine  60 mL Topical Once   And  . [START ON 06/25/2018] chlorhexidine  60 mL Topical Once  . [START ON 06/25/2018] chlorhexidine  15 mL Mouth/Throat Once  . Chlorhexidine Gluconate Cloth  6 each Topical Q0600  . [START ON 06/25/2018] heparin-papaverine-plasmalyte irrigation   Irrigation To OR  . hydrochlorothiazide  25 mg Oral Daily  . Influenza vac split quadrivalent PF  0.5 mL Intramuscular Tomorrow-1000  . insulin aspart  0-15 Units Subcutaneous TID WC  . insulin aspart  0-5 Units Subcutaneous QHS  . insulin aspart  4 Units Subcutaneous TID WC  . [START  ON 06/25/2018] insulin   Intravenous To OR  . [START ON 06/25/2018] magnesium sulfate  40 mEq Other To OR  . [START ON 06/25/2018] metoprolol tartrate  12.5 mg Oral Once  . metoprolol tartrate  25 mg Oral BID  . mupirocin ointment  1 application Nasal BID  . [START ON 06/25/2018] phenylephrine  30-200 mcg/min Intravenous To OR  . [START ON 06/25/2018] potassium chloride  80 mEq Other To OR  . [START ON 06/25/2018] tranexamic acid  15 mg/kg Intravenous To OR  . [START ON 06/25/2018] tranexamic acid  2 mg/kg Intracatheter To OR  . [START ON 06/25/2018] vancomycin 1000 mg in NS (1000 ml) irrigation for Dr. Roxy Manns case   Irrigation To OR    Assessment: 43 YOM presenting with CP to MCHP, elevated troponin and pharmacy consulted to start heparin, no AC PTA and CBC wnl.    Patient s/p urgent cath, found to have multivessel disease and cvts consulted and evaluated in lab> planned CABG in am. No P2y12 given, heparin restarted.  Heparin drip 2000 uts/hr, heparin level remains undetectable despite multiple rate increases.  Per RN, no issues with IV infusion, heparin running peripherally, phlebotomy sticking peripherally for labs.  Goal of Therapy:  Heparin level 0.3-0.7 units/mL Monitor platelets by anticoagulation protocol: Yes   Plan:   Increase heparin to 2200 units/hr - tunr off in am on way to OR Awaiting CABG tomorrow.   Bonnita Nasuti Pharm.D. CPP, BCPS Clinical Pharmacist [phone] 06/24/2018 7:42 PM

## 2018-06-24 NOTE — Consult Note (Signed)
[facility] Nurse wound consult note Patient evaluated in [place].  No family present. Reason for Consult: Right heel wound Wound type: Right heel wound is a trauma injury the patient states occurred when he wore a shoe that rubbed his heel, and caused a blister that popped.  The wound measures 2.4 cm x 3 cm, is 100% covered in dry skin from the popped bulla. It was open to air at the time of my evaluation. There is no surrounding erythema or induration.  The right 3rd metatarsal head area, plantar surface has a wound that resulted from trauma in this diabetic patient, that measures 1.8 cm x 1.9 cm x 0.4 cm.  It is somewhat covered with a rolled small foam dressing.  Around the wound is dried blood.  The wound bed appears clean and pink.  He states a podiatrist has been applying collagen to the wound.  I explained that is a product we do not carry here, but I will put orders in the provide for wound care.  There is no surrounding induration or erythema. POA: Yes Plan of care for the heel:  Apply betadine, allow to air dry, cover with dry gauze. To be performed each shift. Plan of care for the metatarsal head wound:  Wash thoroughly with saline.  Place a small piece of Xeroform gauze into the wound bed.  Cover with a dry gauze.  Change each shift. Monitor the wound area(s) for worsening of condition such as: Signs/symptoms of infection,  Increase in size,  Development of or worsening of odor, Development of pain, or increased pain at the affected locations.  Notify the medical team if any of these develop.  Thank you for the consult.  Discussed plan of care with the patient and bedside nurse.  [place] nurse will not follow at this time.  Please re-consult the [place] team if needed.  Val Riles, RN, MSN, CWOCN, CNS-BC, pager [phone]

## 2018-06-24 NOTE — Progress Notes (Signed)
      [address]       [place], [address]             [phone]     CARDIOTHORACIC SURGERY PROGRESS NOTE  2 Days Post-Op  S/P Procedure(s) (LRB): LEFT HEART CATH AND CORONARY ANGIOGRAPHY (N/A)  Subjective: No chest pain.  No SOB.  Feels well.  Doesn't like the food.  Objective: Vital signs in last 24 hours: Temp:  [98.4 F (36.9 C)-100.8 F (38.2 C)] 98.7 F (37.1 C) (11/26 0732) Pulse Rate:  [98] 98 (11/26 1124) Cardiac Rhythm: Normal sinus rhythm;Sinus tachycardia (11/26 0800) Resp:  [16-23] 19 (11/26 1000) BP: (100-138)/(61-98) 130/81 (11/26 1124) SpO2:  [95 %-96 %] 95 % (11/25 1600)  Physical Exam:  Rhythm:   sinus  Breath sounds: clear  Heart sounds:  RRR w/out murmur  Incisions:  n/a  Abdomen:  soft  Extremities:  warm   Intake/Output from previous day: 11/25 0701 - 11/26 0700 In: 1182.3 [P.O.:580; I.V.:602.3] Out: 350 [Urine:350] Intake/Output this shift: Total I/O In: 659.3 [P.O.:470; I.V.:189.3] Out: -   Lab Results: Recent Labs    06/23/18 0415 06/24/18 1033  WBC 11.7* 12.4*  HGB 13.1 13.3  HCT 38.5* 40.6  PLT 248 288   BMET:  Recent Labs    06/23/18 0415 06/24/18 1033  NA 132* 132*  K 3.3* 3.6  CL 98 96*  CO2 24 24  GLUCOSE 252* 205*  BUN 22* 17  CREATININE 1.19 1.10  CALCIUM 7.8* 8.6*    CBG (last 3)  Recent Labs    06/23/18 2059 06/24/18 0647 06/24/18 1135  GLUCAP 168* 243* 199*   PT/INR:   Recent Labs    06/23/18 0415  LABPROT 15.6*  INR 1.25    CXR:  CHEST  1 VIEW  COMPARISON:  06/22/2018.  FINDINGS: Mediastinum and hilar structures normal. Mild left base pleural-parenchymal thickening again noted consistent scarring. Similar findings noted on prior exam. No pleural effusion or pneumothorax. Heart size normal. No acute bony abnormality.  IMPRESSION: Left base pleural-parenchymal thickening again noted consistent with scarring.   Electronically Signed   By: Marcello Moores  Register  On: 06/24/2018 10:08   Assessment/Plan: S/P Procedure(s) (LRB): LEFT HEART CATH AND CORONARY ANGIOGRAPHY (N/A)  Clinically stable For OR tomorrow I have again reviewed the indications, risks and potential benefits of surgery with the patient and his wife.  Expectations for his postop recovery were discussed.. All questions answered.  I spent in excess of 15 minutes during the conduct of this hospital encounter and >50% of this time involved direct face-to-face encounter with the patient for counseling and/or coordination of their care.   Rexene Alberts, MD 06/24/2018 2:42 PM

## 2018-06-24 NOTE — Progress Notes (Signed)
Inpatient Diabetes Program Recommendations  AACE/ADA: New Consensus Statement on Inpatient Glycemic Control (2015)  Target Ranges:  Prepandial:   less than 140 mg/dL      Peak postprandial:   less than 180 mg/dL (1-2 hours)      Critically ill patients:  140 - 180 mg/dL   Lab Results  Component Value Date   GLUCAP 199 (H) 06/24/2018   HGBA1C 8.2 (H) 06/23/2018    Review of Glycemic Control Diabetes history: DM2 Outpatient Diabetes medications:  Metformin 1000 mg q AM Current orders for Inpatient glycemic control:  Novolog 4 units tid with meals, Novolog moderate tid with meals and HS Inpatient Diabetes Program Recommendations:     Spoke with patient regarding A1C.  He states that he has been doing better with his eating and Diabetes.  He states that his last A1C was 11%?  We discussed that his A1C is now 8.2%.  Also discussed goal A1C=7%.  Patient seems to understand why glycemic control is important.  He states "I'm starving here".  He asked if he can have a sandwich from [employer].  We briefly discussed the "plate method" and how carbohydrates raise blood sugars.  He states that he was referred to an endocrinologist.  Encouraged him to f/u on this appointment.  Wife works at [employer].  He is also interested in the "[employer]" program.  Brought wife and patient handouts regarding [place] program and Healthy eating for Diabetes.   Thanks  Adah Perl, RN, BC-ADM Inpatient Diabetes Coordinator Pager [phone] (8a-5p)

## 2018-06-24 NOTE — Progress Notes (Signed)
Joshua Branch for Heparin Indication: chest pain/ACS, s/p cath  Allergies  Allergen Reactions  . Apricot Kernel Oil [Prunus] Swelling    THROAT  . Cherry Swelling    THROAT  . Other Other (See Comments)    Fruits with a pit. Throat swells up. Can have them if cooked   . Peach [Prunus Persica] Swelling    THROAT  . Plum Pulp Swelling    THROAT    Patient Measurements: Height: 6' 2.5" (189.2 cm) Weight: 241 lb 2.9 oz (109.4 kg) IBW/kg (Calculated) : 83.35 Heparin Dosing Weight: 105kg  Vital Signs: Temp: 98.7 F (37.1 C) (11/26 0732) Temp Source: Oral (11/26 0732) BP: 133/87 (11/26 0800)  Labs: Recent Labs    06/22/18 1003 06/22/18 1815  06/23/18 0415 06/23/18 1548 06/23/18 2356 06/24/18 0904  HGB 16.1  --   --  13.1  --   --   --   HCT 48.3  --   --  38.5*  --   --   --   PLT 258  --   --  248  --   --   --   APTT  --   --   --  >200*  --   --   --   LABPROT  --   --   --  15.6*  --   --   --   INR  --   --   --  1.25  --   --   --   HEPARINUNFRC  --   --    < > 1.32* <0.10* <0.10* <0.10*  CREATININE 0.97  --   --  1.19  --   --   --   TROPONINI 12.60* 9.09*  --  8.07*  --   --   --    < > = values in this interval not displayed.    Estimated Creatinine Clearance: 93.1 mL/min (by C-G formula based on SCr of 1.19 mg/dL).   Medical History: Past Medical History:  Diagnosis Date  . Acute myocardial infarction ([facility]) 06/22/2018  . Allergy   . Charcot's joint of left foot, non-diabetic   . Coronary artery disease involving native coronary artery with unstable angina pectoris ([facility]) 06/22/2018  . Diabetes ([facility])    type 2  . Hyperlipidemia   . Hypertension   . Neuropathy of left foot   . Obstructive sleep apnea   . Right foot ulcer ([facility])   . Type II diabetes mellitus with complication, uncontrolled ([facility])   . Uncontrolled type 2 diabetes mellitus ([name]     Medications:  Scheduled:  . aspirin EC  81 mg Oral Daily  .  atorvastatin  80 mg Oral q1800  . Chlorhexidine Gluconate Cloth  6 each Topical Q0600  . [START ON 06/25/2018] heparin-papaverine-plasmalyte irrigation   Irrigation To OR  . hydrochlorothiazide  25 mg Oral Daily  . Influenza vac split quadrivalent PF  0.5 mL Intramuscular Tomorrow-1000  . insulin aspart  0-15 Units Subcutaneous TID WC  . insulin aspart  0-5 Units Subcutaneous QHS  . insulin aspart  4 Units Subcutaneous TID WC  . [START ON 06/25/2018] insulin   Intravenous To OR  . [START ON 06/25/2018] magnesium sulfate  40 mEq Other To OR  . metoprolol tartrate  25 mg Oral BID  . mupirocin ointment  1 application Nasal BID  . [START ON 06/25/2018] phenylephrine  30-200 mcg/min Intravenous To OR  . [START ON 06/25/2018] potassium  chloride  80 mEq Other To OR  . potassium chloride  40 mEq Oral Once  . [START ON 06/25/2018] tranexamic acid  15 mg/kg Intravenous To OR  . [START ON 06/25/2018] tranexamic acid  2 mg/kg Intracatheter To OR  . [START ON 06/25/2018] vancomycin 1000 mg in NS (1000 ml) irrigation for Dr. Roxy Manns case   Irrigation To OR    Assessment: 52 YOM presenting with CP to [facility], elevated troponin and pharmacy consulted to start heparin, no AC PTA and CBC wnl.    Patient s/p urgent cath, found to have multivessel disease and cvts consulted and evaluated in lab. No P2y12 given, heparin restarted.  Heparin level this AM remains undetectable despite multiple rate increases.  Per RN, no issues with IV infusion, heparin running peripherally, phlebotomy sticking peripherally for labs.  Goal of Therapy:  Heparin level 0.3-0.7 units/mL Monitor platelets by anticoagulation protocol: Yes   Plan:  Increase heparin to 2000 units/hr Re-check heparin level in 6 hrs Daily heparin level and CBC. Awaiting CABG tomorrow.  Marguerite Olea, [facility] Clinical Pharmacist Phone [phone]  06/24/2018 10:39 AM

## 2018-06-24 NOTE — Progress Notes (Addendum)
Subjective:  No chest pain. Remains on nitroglycerin drip Temp 100.8 F No cough, dysuria, abdominal pain  Seen by wound nurse.   Objective:  Vital Signs in the last 24 hours: Temp:  [98.4 F (36.9 C)-100.8 F (38.2 C)] 98.7 F (37.1 C) (11/26 0732) Resp:  [16-29] 19 (11/26 0800) BP: (100-138)/(61-98) 133/87 (11/26 0800) SpO2:  [92 %-97 %] 95 % (11/25 1600)  Intake/Output from previous day: 11/25 0701 - 11/26 0700 In: 1182.3 [P.O.:580; I.V.:602.3] Out: 350 [Urine:350] Intake/Output from this shift: Total I/O In: 271.9 [P.O.:220; I.V.:51.9] Out: -   Physical Exam: Nursing note and vitals reviewed. Constitutional: He is oriented to person, place, and time. He appears well-developed and well-nourished. No distress.  HENT:  Head: Normocephalic and atraumatic.  Eyes: Pupils are equal, round, and reactive to light. Conjunctivae are normal.  Neck: No JVD present.  Cardiovascular: Normal rate, regular rhythm, normal heart sounds and intact distal pulses.  No murmur heard. Pulses:      Dorsalis pedis pulses are 2+ on the right side, and 2+ on the left side.       Posterior tibial pulses are 2+ on the right side, and 2+ on the left side.  Respiratory: Effort normal and breath sounds normal. He has no wheezes. He has no rales.  GI: Soft. Bowel sounds are normal. There is no rebound.  Musculoskeletal: He exhibits no edema.  Lymphadenopathy:    He has no cervical adenopathy.  Neurological: He is alert and oriented to person, place, and time.  Skin: Skin is warm and dry.  Deep trophic ulcer on the ball of right foot, blister on Rt heel, blister on left sole. No erythema, purulent discharge.  Psychiatric: He has a normal mood and affect.   Lab Results: Recent Labs    06/22/18 1003 06/23/18 0415  WBC 15.4* 11.7*  HGB 16.1 13.1  PLT 258 248   Recent Labs    06/22/18 1003 06/23/18 0415  NA 129* 132*  K 4.0 3.3*  CL 92* 98  CO2 27 24  GLUCOSE 295* 252*  BUN 17 22*   CREATININE 0.97 1.19   Recent Labs    06/22/18 1815 06/23/18 0415  TROPONINI 9.09* 8.07*   Hepatic Function Panel Recent Labs    06/23/18 0415  PROT 6.0*  ALBUMIN 2.7*  AST 27  ALT 20  ALKPHOS 49  BILITOT 1.1   Recent Labs    06/23/18 0415  CHOL 120   Cardiac studies:   EKG 06/22/2018: Sinus tachycardia 103 bpm, left axis deviation.  Diffuse ST elevation in anterolateral leads .  No reciprocal changes seen.  Possible acute lateral infarct.  Echocardiogram 06/22/2018: Study Conclusions  - Left ventricle: The cavity size was normal. Wall thickness was normal. Systolic function was normal. The estimated ejection fraction was in the range of 50% to 55%. Mild hypokinesis of the apical myocardium. No thrombus seen on contrast study. Left ventricular diastolic function parameters were normal. - Aortic valve: Mildly calcified annulus. Mildly thickened leaflets. No significant stenosis or regurgitation. - Mitral valve: Mildly calcified annulus. No significant stenosis or regurgitation.  Cath 06/22/2018: LM: Normal LAD: Prox ectatic areas with 40% stenoses Mid to distal LAD is likely the culprit vessel. Mid 100% occlusion, with recanalization.Distal apical LAD is  completely occluded and receives faint collaterals from septal perforators, and distal RPDA. TIMI 1 flow in distal LAD, TIMI 0 flow in distal apical LAD LCx: Large OM 1 with proxiaml 50% and mid to distal 80% stenosis  Medium sized OM2 completely occluded with collaterals from OM1.  RCA: Dominant. Prox 40%, mid 80%, and ostial RPDA 95% stenoses. RPDA gives faint collateral to apical distal   LVEF 50-55% with apical hypokinesis.    Assessment/Plan:  57 year old Caucasian male with uncontrolled type II diabetes mellitus, hypertension, nonhealing trophic ulcer right sole, now with acute coronary syndrome  Acute coronary syndrome: Mild ST elevation in  multiple leads. Likely late presentation of anteroalteral STEMI with partial recanalization of LAD.Severe multivessel CAD, as detailed above. Given patient's absence of chest pain at the end of the cardiac catheterization, I decided to not commit him to percutaneous revascularization.  We discussed surgical versus percutaneous revascularization. Seen by Dr. Roxy Manns. Patient will undergo CABG on 06/25/2018.  Continue aspirin, heparin for now. Discontinue tirofiban.Continue nitroglycerin drip.  Continue metoprolol, lipitor. Will add ACEi on discharge  Unfontrolled type 2 DM: A1C 11%. Only on metformin at basline. WIll hold metofrmin while inpatient. Insulin coverage  Nonhealing Rt sole ulcer: Intact distal pulses. Ulcer is trophic with no acute infection. Podiatry, wound consult.  Will monitor for signs of ischemia. Appreciate wound consult.   Temp 100.8 F: Incentive spirometry. CBC, CXR. Monitor for any recurrence.   Hypokalemia: K replaced.  I discussed the plan with patient's wife Mathew Postiglione and family, at length.      LOS: 2 days    Daena Alper J Carime Dinkel 06/24/2018, 10:00 AM  Kalany Diekmann Esther Hardy, MD [facility] [place] Pager: [phone] Office: [phone] If no answer Cell [phone]

## 2018-06-24 NOTE — Anesthesia Preprocedure Evaluation (Addendum)
Anesthesia Evaluation  Patient identified by MRN, date of birth, ID band Patient awake    Reviewed: Allergy & Precautions, H&P , NPO status , Patient's Chart, lab work & pertinent test results  Airway Mallampati: II  TM Distance: >3 FB Neck ROM: Full    Dental no notable dental hx. (+) Teeth Intact, Dental Advisory Given   Pulmonary neg pulmonary ROS, sleep apnea ,    Pulmonary exam normal breath sounds clear to auscultation       Cardiovascular Exercise Tolerance: Good hypertension, Pt. on medications + angina + CAD and + Past MI   Rhythm:Regular Rate:Normal     Neuro/Psych negative neurological ROS  negative psych ROS   GI/Hepatic negative GI ROS, Neg liver ROS,   Endo/Other  diabetes, Type 2, Oral Hypoglycemic Agents  Renal/GU negative Renal ROS  negative genitourinary   Musculoskeletal  (+) Arthritis ,   Abdominal   Peds  Hematology negative hematology ROS (+)   Anesthesia Other Findings   Reproductive/Obstetrics negative OB ROS                            Anesthesia Physical Anesthesia Plan  ASA: IV  Anesthesia Plan: General   Post-op Pain Management:    Induction: Intravenous  PONV Risk Score and Plan: 3 and Midazolam and Treatment may vary due to age or medical condition  Airway Management Planned: Oral ETT  Additional Equipment: Arterial line, CVP, PA Cath, TEE and Ultrasound Guidance Line Placement  Intra-op Plan:   Post-operative Plan: Post-operative intubation/ventilation  Informed Consent: I have reviewed the patients History and Physical, chart, labs and discussed the procedure including the risks, benefits and alternatives for the proposed anesthesia with the patient or authorized representative who has indicated his/her understanding and acceptance.   Dental advisory given  Plan Discussed with: CRNA  Anesthesia Plan Comments:         Anesthesia Quick  Evaluation

## 2018-06-24 NOTE — Telephone Encounter (Signed)
Received Physician note and Dermatopathology Report results from [facility]; forwarded to provider/SLS 11/26

## 2018-06-25 ENCOUNTER — Inpatient Hospital Stay (HOSPITAL_COMMUNITY): Payer: 59 | Admitting: Certified Registered Nurse Anesthetist

## 2018-06-25 ENCOUNTER — Inpatient Hospital Stay (HOSPITAL_COMMUNITY): Payer: 59

## 2018-06-25 ENCOUNTER — Encounter (HOSPITAL_COMMUNITY): Payer: Self-pay | Admitting: Thoracic Surgery (Cardiothoracic Vascular Surgery)

## 2018-06-25 ENCOUNTER — Encounter (HOSPITAL_COMMUNITY)
Admission: EM | Disposition: A | Discharge status: Home / Self Care | Payer: Self-pay | Source: Home / Self Care | Attending: Thoracic Surgery (Cardiothoracic Vascular Surgery)

## 2018-06-25 DIAGNOSIS — I25110 Atherosclerotic heart disease of native coronary artery with unstable angina pectoris: Secondary | ICD-10-CM

## 2018-06-25 DIAGNOSIS — Z951 Presence of aortocoronary bypass graft: Secondary | ICD-10-CM

## 2018-06-25 HISTORY — PX: CORONARY ARTERY BYPASS GRAFT: SHX141

## 2018-06-25 HISTORY — PX: ENDOVEIN HARVEST OF GREATER SAPHENOUS VEIN: SHX5059

## 2018-06-25 HISTORY — PX: TEE WITHOUT CARDIOVERSION: SHX5443

## 2018-06-25 HISTORY — DX: Presence of aortocoronary bypass graft: Z95.1

## 2018-06-25 LAB — POCT I-STAT, CHEM 8
BUN: 19 mg/dL (ref 6–20)
BUN: 17 mg/dL (ref 6–20)
BUN: 18 mg/dL (ref 6–20)
BUN: 18 mg/dL (ref 6–20)
BUN: 15 mg/dL (ref 6–20)
BUN: 18 mg/dL (ref 6–20)
CALCIUM ION: 1 mmol/L — AB (ref 1.15–1.40)
CALCIUM ION: 1.02 mmol/L — AB (ref 1.15–1.40)
CHLORIDE: 99 mmol/L (ref 98–111)
CHLORIDE: 100 mmol/L (ref 98–111)
CHLORIDE: 99 mmol/L (ref 98–111)
CREATININE: 0.8 mg/dL (ref 0.61–1.24)
CREATININE: 0.7 mg/dL (ref 0.61–1.24)
Calcium, Ion: 1.14 mmol/L — ABNORMAL LOW (ref 1.15–1.40)
Calcium, Ion: 1.03 mmol/L — ABNORMAL LOW (ref 1.15–1.40)
Calcium, Ion: 1.14 mmol/L — ABNORMAL LOW (ref 1.15–1.40)
Calcium, Ion: 1.03 mmol/L — ABNORMAL LOW (ref 1.15–1.40)
Chloride: 98 mmol/L (ref 98–111)
Chloride: 98 mmol/L (ref 98–111)
Chloride: 104 mmol/L (ref 98–111)
Creatinine, Ser: 0.8 mg/dL (ref 0.61–1.24)
Creatinine, Ser: 0.8 mg/dL (ref 0.61–1.24)
Creatinine, Ser: 0.7 mg/dL (ref 0.61–1.24)
Creatinine, Ser: 0.7 mg/dL (ref 0.61–1.24)
GLUCOSE: 175 mg/dL — AB (ref 70–99)
GLUCOSE: 176 mg/dL — AB (ref 70–99)
Glucose, Bld: 199 mg/dL — ABNORMAL HIGH (ref 70–99)
Glucose, Bld: 169 mg/dL — ABNORMAL HIGH (ref 70–99)
Glucose, Bld: 187 mg/dL — ABNORMAL HIGH (ref 70–99)
Glucose, Bld: 119 mg/dL — ABNORMAL HIGH (ref 70–99)
HCT: 27 % — ABNORMAL LOW (ref 39.0–52.0)
HCT: 27 % — ABNORMAL LOW (ref 39.0–52.0)
HEMATOCRIT: 36 % — AB (ref 39.0–52.0)
HEMATOCRIT: 31 % — AB (ref 39.0–52.0)
HEMATOCRIT: 30 % — AB (ref 39.0–52.0)
HEMATOCRIT: 30 % — AB (ref 39.0–52.0)
Hemoglobin: 12.2 g/dL — ABNORMAL LOW (ref 13.0–17.0)
Hemoglobin: 10.5 g/dL — ABNORMAL LOW (ref 13.0–17.0)
Hemoglobin: 9.2 g/dL — ABNORMAL LOW (ref 13.0–17.0)
Hemoglobin: 10.2 g/dL — ABNORMAL LOW (ref 13.0–17.0)
Hemoglobin: 9.2 g/dL — ABNORMAL LOW (ref 13.0–17.0)
Hemoglobin: 10.2 g/dL — ABNORMAL LOW (ref 13.0–17.0)
POTASSIUM: 4.1 mmol/L (ref 3.5–5.1)
POTASSIUM: 4.3 mmol/L (ref 3.5–5.1)
POTASSIUM: 5.6 mmol/L — AB (ref 3.5–5.1)
POTASSIUM: 5.2 mmol/L — AB (ref 3.5–5.1)
POTASSIUM: 4.3 mmol/L (ref 3.5–5.1)
Potassium: 4.8 mmol/L (ref 3.5–5.1)
SODIUM: 134 mmol/L — AB (ref 135–145)
Sodium: 133 mmol/L — ABNORMAL LOW (ref 135–145)
Sodium: 134 mmol/L — ABNORMAL LOW (ref 135–145)
Sodium: 134 mmol/L — ABNORMAL LOW (ref 135–145)
Sodium: 135 mmol/L (ref 135–145)
Sodium: 135 mmol/L (ref 135–145)
TCO2: 30 mmol/L (ref 22–32)
TCO2: 30 mmol/L (ref 22–32)
TCO2: 31 mmol/L (ref 22–32)
TCO2: 27 mmol/L (ref 22–32)
TCO2: 27 mmol/L (ref 22–32)
TCO2: 23 mmol/L (ref 22–32)

## 2018-06-25 LAB — CBC
HCT: 33.5 % — ABNORMAL LOW (ref 39.0–52.0)
HEMATOCRIT: 38.8 % — AB (ref 39.0–52.0)
HEMOGLOBIN: 13.3 g/dL (ref 13.0–17.0)
HEMOGLOBIN: 11.3 g/dL — AB (ref 13.0–17.0)
MCH: 30.3 pg (ref 26.0–34.0)
MCH: 30.6 pg (ref 26.0–34.0)
MCHC: 34.3 g/dL (ref 30.0–36.0)
MCHC: 33.7 g/dL (ref 30.0–36.0)
MCV: 88.4 fL (ref 80.0–100.0)
MCV: 90.8 fL (ref 80.0–100.0)
Platelets: 259 10*3/uL (ref 150–400)
Platelets: 179 10*3/uL (ref 150–400)
RBC: 4.39 MIL/uL (ref 4.22–5.81)
RBC: 3.69 MIL/uL — AB (ref 4.22–5.81)
RDW: 11.2 % — ABNORMAL LOW (ref 11.5–15.5)
RDW: 11.4 % — ABNORMAL LOW (ref 11.5–15.5)
WBC: 10.3 10*3/uL (ref 4.0–10.5)
WBC: 11.5 10*3/uL — ABNORMAL HIGH (ref 4.0–10.5)
nRBC: 0 % (ref 0.0–0.2)
nRBC: 0 % (ref 0.0–0.2)

## 2018-06-25 LAB — POCT I-STAT 3, ART BLOOD GAS (G3+)
ACID-BASE DEFICIT: 3 mmol/L — AB (ref 0.0–2.0)
ACID-BASE EXCESS: 3 mmol/L — AB (ref 0.0–2.0)
Acid-base deficit: 2 mmol/L (ref 0.0–2.0)
Acid-base deficit: 3 mmol/L — ABNORMAL HIGH (ref 0.0–2.0)
BICARBONATE: 28.7 mmol/L — AB (ref 20.0–28.0)
BICARBONATE: 24 mmol/L (ref 20.0–28.0)
BICARBONATE: 21.4 mmol/L (ref 20.0–28.0)
Bicarbonate: 22.5 mmol/L (ref 20.0–28.0)
O2 SAT: 98 %
O2 Saturation: 100 %
O2 Saturation: 98 %
O2 Saturation: 99 %
PCO2 ART: 44.1 mmHg (ref 32.0–48.0)
PCO2 ART: 34.6 mmHg (ref 32.0–48.0)
PH ART: 7.401 (ref 7.350–7.450)
PO2 ART: 291 mmHg — AB (ref 83.0–108.0)
PO2 ART: 113 mmHg — AB (ref 83.0–108.0)
PO2 ART: 135 mmHg — AB (ref 83.0–108.0)
PO2 ART: 117 mmHg — AB (ref 83.0–108.0)
Patient temperature: 36.4
Patient temperature: 37.3
TCO2: 30 mmol/L (ref 22–32)
TCO2: 25 mmol/L (ref 22–32)
TCO2: 22 mmol/L (ref 22–32)
TCO2: 24 mmol/L (ref 22–32)
pCO2 arterial: 50 mmHg — ABNORMAL HIGH (ref 32.0–48.0)
pCO2 arterial: 40.6 mmHg (ref 32.0–48.0)
pH, Arterial: 7.367 (ref 7.350–7.450)
pH, Arterial: 7.341 — ABNORMAL LOW (ref 7.350–7.450)
pH, Arterial: 7.351 (ref 7.350–7.450)

## 2018-06-25 LAB — BASIC METABOLIC PANEL
Anion gap: 8 (ref 5–15)
BUN: 17 mg/dL (ref 6–20)
CHLORIDE: 99 mmol/L (ref 98–111)
CO2: 26 mmol/L (ref 22–32)
Calcium: 8.5 mg/dL — ABNORMAL LOW (ref 8.9–10.3)
Creatinine, Ser: 1.2 mg/dL (ref 0.61–1.24)
Glucose, Bld: 185 mg/dL — ABNORMAL HIGH (ref 70–99)
POTASSIUM: 4.1 mmol/L (ref 3.5–5.1)
SODIUM: 133 mmol/L — AB (ref 135–145)

## 2018-06-25 LAB — APTT: aPTT: 38 s — ABNORMAL HIGH (ref 24–36)

## 2018-06-25 LAB — HEMOGLOBIN AND HEMATOCRIT, BLOOD
HEMATOCRIT: 30.9 % — AB (ref 39.0–52.0)
HEMOGLOBIN: 10.6 g/dL — AB (ref 13.0–17.0)

## 2018-06-25 LAB — CREATININE, SERUM
CREATININE: 0.87 mg/dL (ref 0.61–1.24)
GFR calc non Af Amer: >60 mL/min (ref >60)

## 2018-06-25 LAB — POCT I-STAT 4, (NA,K, GLUC, HGB,HCT)
Glucose, Bld: 145 mg/dL — ABNORMAL HIGH (ref 70–99)
HEMATOCRIT: 33 % — AB (ref 39.0–52.0)
HEMOGLOBIN: 11.2 g/dL — AB (ref 13.0–17.0)
Potassium: 4.3 mmol/L (ref 3.5–5.1)
Sodium: 137 mmol/L (ref 135–145)

## 2018-06-25 LAB — GLUCOSE, CAPILLARY
GLUCOSE-CAPILLARY: 121 mg/dL — AB (ref 70–99)
GLUCOSE-CAPILLARY: 108 mg/dL — AB (ref 70–99)
Glucose-Capillary: 90 mg/dL (ref 70–99)
Glucose-Capillary: 98 mg/dL (ref 70–99)
Glucose-Capillary: 120 mg/dL — ABNORMAL HIGH (ref 70–99)
Glucose-Capillary: 108 mg/dL — ABNORMAL HIGH (ref 70–99)
Glucose-Capillary: 114 mg/dL — ABNORMAL HIGH (ref 70–99)
Glucose-Capillary: 141 mg/dL — ABNORMAL HIGH (ref 70–99)

## 2018-06-25 LAB — SURGICAL PCR SCREEN
MRSA, PCR: POSITIVE — AB
Staphylococcus aureus: POSITIVE — AB

## 2018-06-25 LAB — PLATELET COUNT: PLATELETS: 200 10*3/uL (ref 150–400)

## 2018-06-25 LAB — MAGNESIUM: MAGNESIUM: 3.1 mg/dL — AB (ref 1.7–2.4)

## 2018-06-25 LAB — PROTIME-INR
INR: 1.39
Prothrombin Time: 16.9 seconds — ABNORMAL HIGH (ref 11.4–15.2)

## 2018-06-25 SURGERY — CORONARY ARTERY BYPASS GRAFTING (CABG)
Anesthesia: General | Site: Leg Lower | Laterality: Right

## 2018-06-25 MED ORDER — MAGNESIUM SULFATE 4 GM/100ML IV SOLN
4.0000 g | Freq: Once | INTRAVENOUS | Status: DC
  Filled 2018-06-25: qty 100

## 2018-06-25 MED ORDER — ASPIRIN EC 325 MG PO TBEC: 325.0000 mg | DELAYED_RELEASE_TABLET | Freq: Every day | ORAL | Status: DC

## 2018-06-25 MED ORDER — NITROGLYCERIN IN D5W 200-5 MCG/ML-% IV SOLN: 0.0000 ug/min | INTRAVENOUS | Status: DC

## 2018-06-25 MED ORDER — ALBUMIN HUMAN 5 % IV SOLN
INTRAVENOUS | Status: DC | PRN
  Administered 2018-06-25 (×3): via INTRAVENOUS

## 2018-06-25 MED ORDER — ACETAMINOPHEN 500 MG PO TABS
1000.0000 mg | ORAL_TABLET | Freq: Four times a day (QID) | ORAL | Status: DC
  Administered 2018-06-25 – 2018-06-29 (×14): 1000 mg via ORAL
  Filled 2018-06-25 (×14): qty 2

## 2018-06-25 MED ORDER — FENTANYL CITRATE (PF) 250 MCG/5ML IJ SOLN
INTRAMUSCULAR | Status: AC
  Filled 2018-06-25: qty 25

## 2018-06-25 MED ORDER — SODIUM CHLORIDE 0.9 % IV SOLN
INTRAVENOUS | Status: DC | PRN
  Administered 2018-06-25: 0.2 ug/kg/h via INTRAVENOUS

## 2018-06-25 MED ORDER — BISACODYL 10 MG RE SUPP: 10.0000 mg | Freq: Every day | RECTAL | Status: DC

## 2018-06-25 MED ORDER — SODIUM CHLORIDE 0.9% FLUSH
10.0000 mL | Freq: Two times a day (BID) | INTRAVENOUS | Status: DC
  Administered 2018-06-26: 10 mL
  Administered 2018-06-26: 20 mL
  Administered 2018-06-27: 10 mL

## 2018-06-25 MED ORDER — DEXMEDETOMIDINE HCL IN NACL 200 MCG/50ML IV SOLN
0.0000 ug/kg/h | INTRAVENOUS | Status: DC
  Administered 2018-06-25: 0.6 ug/kg/h via INTRAVENOUS
  Filled 2018-06-25: qty 50

## 2018-06-25 MED ORDER — SODIUM CHLORIDE 0.9 % IV SOLN
INTRAVENOUS | Status: DC | PRN
  Administered 2018-06-25: 1 [IU]/h via INTRAVENOUS

## 2018-06-25 MED ORDER — HEPARIN SODIUM (PORCINE) 1000 UNIT/ML IJ SOLN
INTRAMUSCULAR | Status: DC | PRN
  Administered 2018-06-25: 3000 [IU] via INTRAVENOUS
  Administered 2018-06-25: 32000 [IU] via INTRAVENOUS

## 2018-06-25 MED ORDER — ALBUMIN HUMAN 5 % IV SOLN
250.0000 mL | INTRAVENOUS | Status: DC | PRN
  Administered 2018-06-25 (×3): 12.5 g via INTRAVENOUS
  Filled 2018-06-25: qty 500

## 2018-06-25 MED ORDER — SODIUM CHLORIDE 0.9% FLUSH
3.0000 mL | Freq: Two times a day (BID) | INTRAVENOUS | Status: DC
  Administered 2018-06-26 (×2): 3 mL via INTRAVENOUS
  Administered 2018-06-27: 10 mL via INTRAVENOUS
  Administered 2018-06-27 – 2018-06-28 (×3): 3 mL via INTRAVENOUS

## 2018-06-25 MED ORDER — LACTATED RINGERS IV SOLN
INTRAVENOUS | Status: DC
  Administered 2018-06-26: via INTRAVENOUS

## 2018-06-25 MED ORDER — INSULIN REGULAR BOLUS VIA INFUSION
0.0000 [IU] | Freq: Three times a day (TID) | INTRAVENOUS | Status: DC
  Filled 2018-06-25: qty 10

## 2018-06-25 MED ORDER — MIDAZOLAM HCL (PF) 10 MG/2ML IJ SOLN
INTRAMUSCULAR | Status: AC
  Filled 2018-06-25: qty 2

## 2018-06-25 MED ORDER — ONDANSETRON HCL 4 MG/2ML IJ SOLN: 4.0000 mg | Freq: Four times a day (QID) | INTRAMUSCULAR | Status: DC | PRN

## 2018-06-25 MED ORDER — ORAL CARE MOUTH RINSE
15.0000 mL | OROMUCOSAL | Status: DC
  Administered 2018-06-25: 15 mL via OROMUCOSAL

## 2018-06-25 MED ORDER — METOPROLOL TARTRATE 25 MG/10 ML ORAL SUSPENSION: 12.5000 mg | Freq: Two times a day (BID) | ORAL | Status: DC

## 2018-06-25 MED ORDER — CHLORHEXIDINE GLUCONATE CLOTH 2 % EX PADS
6.0000 | MEDICATED_PAD | Freq: Every day | CUTANEOUS | Status: DC
  Administered 2018-06-26: 6 via TOPICAL

## 2018-06-25 MED ORDER — SODIUM CHLORIDE 0.9 % IV SOLN
INTRAVENOUS | Status: AC
  Administered 2018-06-25: 14:00:00 via INTRAVENOUS

## 2018-06-25 MED ORDER — DOCUSATE SODIUM 100 MG PO CAPS
200.0000 mg | ORAL_CAPSULE | Freq: Every day | ORAL | Status: DC
  Administered 2018-06-26 – 2018-06-27 (×2): 200 mg via ORAL
  Filled 2018-06-25 (×4): qty 2

## 2018-06-25 MED ORDER — SODIUM CHLORIDE 0.45 % IV SOLN
INTRAVENOUS | Status: DC | PRN
  Administered 2018-06-25: 13:00:00 via INTRAVENOUS

## 2018-06-25 MED ORDER — HEPARIN SODIUM (PORCINE) 1000 UNIT/ML IJ SOLN
INTRAMUSCULAR | Status: AC
  Filled 2018-06-25: qty 1

## 2018-06-25 MED ORDER — PROPOFOL 10 MG/ML IV BOLUS
INTRAVENOUS | Status: AC
  Filled 2018-06-25: qty 20

## 2018-06-25 MED ORDER — MIDAZOLAM HCL 2 MG/2ML IJ SOLN
2.0000 mg | INTRAMUSCULAR | Status: DC | PRN
  Administered 2018-06-25 (×2): 1 mg via INTRAVENOUS
  Filled 2018-06-25 (×2): qty 2

## 2018-06-25 MED ORDER — MORPHINE SULFATE (PF) 2 MG/ML IV SOLN
1.0000 mg | INTRAVENOUS | Status: DC | PRN
  Administered 2018-06-25: 4 mg via INTRAVENOUS
  Administered 2018-06-26: 2 mg via INTRAVENOUS
  Filled 2018-06-25: qty 2
  Filled 2018-06-25: qty 1
  Filled 2018-06-25: qty 2

## 2018-06-25 MED ORDER — SODIUM CHLORIDE 0.9 % IV SOLN
INTRAVENOUS | Status: DC
  Administered 2018-06-25: 13:00:00 via INTRAVENOUS

## 2018-06-25 MED ORDER — LACTATED RINGERS IV SOLN: 500.0000 mL | Freq: Once | INTRAVENOUS | Status: DC | PRN

## 2018-06-25 MED ORDER — LACTATED RINGERS IV SOLN
INTRAVENOUS | Status: DC | PRN
  Administered 2018-06-25 (×2): via INTRAVENOUS

## 2018-06-25 MED ORDER — ROCURONIUM BROMIDE 50 MG/5ML IV SOSY
PREFILLED_SYRINGE | INTRAVENOUS | Status: AC
  Filled 2018-06-25: qty 10

## 2018-06-25 MED ORDER — METOPROLOL TARTRATE 5 MG/5ML IV SOLN: 2.5000 mg | INTRAVENOUS | Status: DC | PRN

## 2018-06-25 MED ORDER — CHLORHEXIDINE GLUCONATE 0.12% ORAL RINSE (MEDLINE KIT): 15.0000 mL | Freq: Two times a day (BID) | OROMUCOSAL | Status: DC

## 2018-06-25 MED ORDER — FAMOTIDINE IN NACL 20-0.9 MG/50ML-% IV SOLN
20.0000 mg | Freq: Two times a day (BID) | INTRAVENOUS | Status: DC
  Administered 2018-06-25: 20 mg via INTRAVENOUS

## 2018-06-25 MED ORDER — SODIUM CHLORIDE 0.9 % IV SOLN
INTRAVENOUS | Status: DC | PRN
  Administered 2018-06-25: 750 mg via INTRAVENOUS

## 2018-06-25 MED ORDER — HEMOSTATIC AGENTS (NO CHARGE) OPTIME
TOPICAL | Status: DC | PRN
  Administered 2018-06-25: 3 via TOPICAL

## 2018-06-25 MED ORDER — CHLORHEXIDINE GLUCONATE 0.12 % MT SOLN: 15.0000 mL | OROMUCOSAL | Status: DC

## 2018-06-25 MED ORDER — TRANEXAMIC ACID 1000 MG/10ML IV SOLN
INTRAVENOUS | Status: DC | PRN
  Administered 2018-06-25: 1.5 mg/kg/h via INTRAVENOUS

## 2018-06-25 MED ORDER — ACETAMINOPHEN 160 MG/5ML PO SOLN: 1000.0000 mg | Freq: Four times a day (QID) | ORAL | Status: DC

## 2018-06-25 MED ORDER — ROCURONIUM BROMIDE 100 MG/10ML IV SOLN
INTRAVENOUS | Status: DC | PRN
  Administered 2018-06-25 (×5): 50 mg via INTRAVENOUS

## 2018-06-25 MED ORDER — SODIUM CHLORIDE 0.9% FLUSH: 3.0000 mL | INTRAVENOUS | Status: DC | PRN

## 2018-06-25 MED ORDER — ORAL CARE MOUTH RINSE
15.0000 mL | Freq: Two times a day (BID) | OROMUCOSAL | Status: DC
  Administered 2018-06-25 – 2018-06-26 (×2): 15 mL via OROMUCOSAL

## 2018-06-25 MED ORDER — ACETAMINOPHEN 650 MG RE SUPP
650.0000 mg | Freq: Once | RECTAL | Status: AC
  Administered 2018-06-25: 650 mg via RECTAL

## 2018-06-25 MED ORDER — TRAMADOL HCL 50 MG PO TABS
50.0000 mg | ORAL_TABLET | ORAL | Status: DC | PRN
  Administered 2018-06-26 (×2): 50 mg via ORAL
  Administered 2018-06-26: 100 mg via ORAL
  Administered 2018-06-26 – 2018-06-27 (×4): 50 mg via ORAL
  Filled 2018-06-25: qty 1
  Filled 2018-06-25: qty 2
  Filled 2018-06-25 (×6): qty 1

## 2018-06-25 MED ORDER — MIDAZOLAM HCL 5 MG/5ML IJ SOLN
INTRAMUSCULAR | Status: DC | PRN
  Administered 2018-06-25: 3 mg via INTRAVENOUS
  Administered 2018-06-25: 1 mg via INTRAVENOUS
  Administered 2018-06-25 (×3): 2 mg via INTRAVENOUS

## 2018-06-25 MED ORDER — SODIUM CHLORIDE 0.9 % IV SOLN
INTRAVENOUS | Status: DC | PRN
  Administered 2018-06-25: 20 ug/min via INTRAVENOUS

## 2018-06-25 MED ORDER — PHENYLEPHRINE 40 MCG/ML (10ML) SYRINGE FOR IV PUSH (FOR BLOOD PRESSURE SUPPORT)
PREFILLED_SYRINGE | INTRAVENOUS | Status: AC
  Filled 2018-06-25: qty 10

## 2018-06-25 MED ORDER — 0.9 % SODIUM CHLORIDE (POUR BTL) OPTIME
TOPICAL | Status: DC | PRN
  Administered 2018-06-25: 5000 mL

## 2018-06-25 MED ORDER — PROTAMINE SULFATE 10 MG/ML IV SOLN
INTRAVENOUS | Status: DC | PRN
  Administered 2018-06-25 (×3): 40 mg via INTRAVENOUS
  Administered 2018-06-25 (×3): 50 mg via INTRAVENOUS

## 2018-06-25 MED ORDER — ASPIRIN 81 MG PO CHEW: 324.0000 mg | CHEWABLE_TABLET | Freq: Every day | ORAL | Status: DC

## 2018-06-25 MED ORDER — LACTATED RINGERS IV SOLN
INTRAVENOUS | Status: DC | PRN
  Administered 2018-06-25 (×2): via INTRAVENOUS

## 2018-06-25 MED ORDER — POTASSIUM CHLORIDE 10 MEQ/50ML IV SOLN: 10.0000 meq | INTRAVENOUS | Status: AC

## 2018-06-25 MED ORDER — VANCOMYCIN HCL IN DEXTROSE 1-5 GM/200ML-% IV SOLN
1000.0000 mg | Freq: Once | INTRAVENOUS | Status: AC
  Administered 2018-06-25: 1000 mg via INTRAVENOUS
  Filled 2018-06-25: qty 200

## 2018-06-25 MED ORDER — SODIUM CHLORIDE 0.9 % IV SOLN
1.5000 g | Freq: Two times a day (BID) | INTRAVENOUS | Status: AC
  Administered 2018-06-25 – 2018-06-27 (×4): 1.5 g via INTRAVENOUS
  Filled 2018-06-25 (×4): qty 1.5

## 2018-06-25 MED ORDER — LACTATED RINGERS IV SOLN: INTRAVENOUS | Status: DC

## 2018-06-25 MED ORDER — PANTOPRAZOLE SODIUM 40 MG PO TBEC
40.0000 mg | DELAYED_RELEASE_TABLET | Freq: Every day | ORAL | Status: DC
  Administered 2018-06-27 – 2018-06-29 (×3): 40 mg via ORAL
  Filled 2018-06-25 (×3): qty 1

## 2018-06-25 MED ORDER — SODIUM CHLORIDE 0.9% FLUSH: 10.0000 mL | INTRAVENOUS | Status: DC | PRN

## 2018-06-25 MED ORDER — INSULIN REGULAR(HUMAN) IN NACL 100-0.9 UT/100ML-% IV SOLN: INTRAVENOUS | Status: DC

## 2018-06-25 MED ORDER — MUPIROCIN 2 % EX OINT
1.0000 "application " | TOPICAL_OINTMENT | Freq: Two times a day (BID) | CUTANEOUS | Status: DC
  Administered 2018-06-25 – 2018-06-26 (×3): 1 via NASAL
  Filled 2018-06-25: qty 22

## 2018-06-25 MED ORDER — ROCURONIUM BROMIDE 50 MG/5ML IV SOSY
PREFILLED_SYRINGE | INTRAVENOUS | Status: AC
  Filled 2018-06-25: qty 15

## 2018-06-25 MED ORDER — SODIUM CHLORIDE 0.9 % IV SOLN: 250.0000 mL | INTRAVENOUS | Status: DC

## 2018-06-25 MED ORDER — SODIUM CHLORIDE (PF) 0.9 % IJ SOLN
INTRAMUSCULAR | Status: AC
  Filled 2018-06-25: qty 20

## 2018-06-25 MED ORDER — PHENYLEPHRINE HCL-NACL 20-0.9 MG/250ML-% IV SOLN: 0.0000 ug/min | INTRAVENOUS | Status: DC

## 2018-06-25 MED ORDER — BISACODYL 5 MG PO TBEC
10.0000 mg | DELAYED_RELEASE_TABLET | Freq: Every day | ORAL | Status: DC
  Administered 2018-06-26 – 2018-06-27 (×2): 10 mg via ORAL
  Filled 2018-06-25 (×3): qty 2

## 2018-06-25 MED ORDER — CHLORHEXIDINE GLUCONATE 4 % EX LIQD
CUTANEOUS | Status: AC
  Filled 2018-06-25: qty 60

## 2018-06-25 MED ORDER — PROPOFOL 10 MG/ML IV BOLUS
INTRAVENOUS | Status: DC | PRN
  Administered 2018-06-25: 50 mg via INTRAVENOUS

## 2018-06-25 MED ORDER — FENTANYL CITRATE (PF) 250 MCG/5ML IJ SOLN
INTRAMUSCULAR | Status: DC | PRN
  Administered 2018-06-25: 700 ug via INTRAVENOUS
  Administered 2018-06-25: 100 ug via INTRAVENOUS
  Administered 2018-06-25: 250 ug via INTRAVENOUS
  Administered 2018-06-25: 50 ug via INTRAVENOUS
  Administered 2018-06-25: 150 ug via INTRAVENOUS

## 2018-06-25 MED ORDER — OXYCODONE HCL 5 MG PO TABS
5.0000 mg | ORAL_TABLET | ORAL | Status: DC | PRN
  Administered 2018-06-25: 10 mg via ORAL
  Filled 2018-06-25: qty 2

## 2018-06-25 MED ORDER — METOPROLOL TARTRATE 12.5 MG HALF TABLET
12.5000 mg | ORAL_TABLET | Freq: Two times a day (BID) | ORAL | Status: DC
  Administered 2018-06-26 (×2): 12.5 mg via ORAL
  Filled 2018-06-25 (×2): qty 1

## 2018-06-25 MED ORDER — ACETAMINOPHEN 160 MG/5ML PO SOLN: 650.0000 mg | Freq: Once | ORAL | Status: AC

## 2018-06-25 SURGICAL SUPPLY — 104 items
ADAPTER CARDIO PERF ANTE/RETRO (ADAPTER) ×4 IMPLANT
BAG DECANTER FOR FLEXI CONT (MISCELLANEOUS) ×8 IMPLANT
BANDAGE ACE 4X5 VEL STRL LF (GAUZE/BANDAGES/DRESSINGS) ×4 IMPLANT
BANDAGE ACE 6X5 VEL STRL LF (GAUZE/BANDAGES/DRESSINGS) ×4 IMPLANT
BANDAGE ELASTIC 4 VELCRO ST LF (GAUZE/BANDAGES/DRESSINGS) ×4 IMPLANT
BANDAGE ELASTIC 6 VELCRO ST LF (GAUZE/BANDAGES/DRESSINGS) ×4 IMPLANT
BASKET HEART (ORDER IN 25'S) (MISCELLANEOUS) ×1
BASKET HEART (ORDER IN 25S) (MISCELLANEOUS) ×3 IMPLANT
BLADE CLIPPER SURG (BLADE) IMPLANT
BLADE STERNUM SYSTEM 6 (BLADE) ×4 IMPLANT
BNDG GAUZE ELAST 4 BULKY (GAUZE/BANDAGES/DRESSINGS) ×4 IMPLANT
CANISTER SUCT 3000ML PPV (MISCELLANEOUS) ×4 IMPLANT
CANNULA EZ GLIDE AORTIC 21FR (CANNULA) ×8 IMPLANT
CANNULA GUNDRY RCSP 15FR (MISCELLANEOUS) ×4 IMPLANT
CATH CPB KIT OWEN (MISCELLANEOUS) ×4 IMPLANT
CATH THORACIC 36FR (CATHETERS) ×4 IMPLANT
CLIP FOGARTY SPRING 6M (CLIP) ×8 IMPLANT
CLIP RETRACTION 3.0MM CORONARY (MISCELLANEOUS) ×4 IMPLANT
CLIP VESOCCLUDE MED 24/CT (CLIP) IMPLANT
CLIP VESOCCLUDE SM WIDE 24/CT (CLIP) IMPLANT
CONN ST 1/4X3/8  BEN (MISCELLANEOUS) ×1
CONN ST 1/4X3/8 BEN (MISCELLANEOUS) ×3 IMPLANT
COVER MAYO STAND STRL (DRAPES) ×8 IMPLANT
COVER WAND RF STERILE (DRAPES) ×4 IMPLANT
CRADLE DONUT ADULT HEAD (MISCELLANEOUS) ×4 IMPLANT
DERMABOND ADVANCED (GAUZE/BANDAGES/DRESSINGS) ×1
DERMABOND ADVANCED .7 DNX12 (GAUZE/BANDAGES/DRESSINGS) ×3 IMPLANT
DRAIN CHANNEL 32F RND 10.7 FF (WOUND CARE) ×8 IMPLANT
DRAPE CARDIOVASCULAR INCISE (DRAPES) ×1
DRAPE INCISE IOBAN 66X45 STRL (DRAPES) ×4 IMPLANT
DRAPE SLUSH/WARMER DISC (DRAPES) ×4 IMPLANT
DRAPE SRG 135X102X78XABS (DRAPES) ×3 IMPLANT
DRSG AQUACEL AG ADV 3.5X14 (GAUZE/BANDAGES/DRESSINGS) ×4 IMPLANT
DRSG COVADERM 4X14 (GAUZE/BANDAGES/DRESSINGS) ×4 IMPLANT
ELECT BLADE 4.0 EZ CLEAN MEGAD (MISCELLANEOUS) ×4
ELECT REM PT RETURN 9FT ADLT (ELECTROSURGICAL) ×8
ELECTRODE BLDE 4.0 EZ CLN MEGD (MISCELLANEOUS) ×3 IMPLANT
ELECTRODE REM PT RTRN 9FT ADLT (ELECTROSURGICAL) ×6 IMPLANT
FELT TEFLON 1X6 (MISCELLANEOUS) ×8 IMPLANT
GAUZE SPONGE 4X4 12PLY STRL (GAUZE/BANDAGES/DRESSINGS) ×8 IMPLANT
GAUZE SPONGE 4X4 12PLY STRL LF (GAUZE/BANDAGES/DRESSINGS) ×12 IMPLANT
GLOVE ORTHO TXT STRL SZ7.5 (GLOVE) ×8 IMPLANT
GOWN STRL REUS W/ TWL LRG LVL3 (GOWN DISPOSABLE) ×12 IMPLANT
GOWN STRL REUS W/TWL LRG LVL3 (GOWN DISPOSABLE) ×4
HEMOSTAT POWDER SURGIFOAM 1G (HEMOSTASIS) ×12 IMPLANT
INSERT FOGARTY XLG (MISCELLANEOUS) ×4 IMPLANT
KIT BASIN OR (CUSTOM PROCEDURE TRAY) ×4 IMPLANT
KIT SUCTION CATH 14FR (SUCTIONS) ×12 IMPLANT
KIT TURNOVER KIT B (KITS) ×4 IMPLANT
KIT VASOVIEW HEMOPRO 2 VH 4000 (KITS) ×4 IMPLANT
LEAD PACING MYOCARDI (MISCELLANEOUS) ×4 IMPLANT
MARKER GRAFT CORONARY BYPASS (MISCELLANEOUS) ×12 IMPLANT
NS IRRIG 1000ML POUR BTL (IV SOLUTION) ×20 IMPLANT
PACK E OPEN HEART (SUTURE) ×4 IMPLANT
PACK OPEN HEART (CUSTOM PROCEDURE TRAY) ×4 IMPLANT
PAD ARMBOARD 7.5X6 YLW CONV (MISCELLANEOUS) ×8 IMPLANT
PAD ELECT DEFIB RADIOL ZOLL (MISCELLANEOUS) ×4 IMPLANT
PENCIL BUTTON HOLSTER BLD 10FT (ELECTRODE) ×4 IMPLANT
PUNCH AORTIC ROTATE 4.0MM (MISCELLANEOUS) ×4 IMPLANT
PUNCH AORTIC ROTATE 4.5MM 8IN (MISCELLANEOUS) IMPLANT
PUNCH AORTIC ROTATE 5MM 8IN (MISCELLANEOUS) IMPLANT
SET CARDIOPLEGIA MPS 5001102 (MISCELLANEOUS) ×4 IMPLANT
SOLUTION ANTI FOG 6CC (MISCELLANEOUS) IMPLANT
SPONGE LAP 18X18 X RAY DECT (DISPOSABLE) IMPLANT
SPONGE LAP 4X18 RFD (DISPOSABLE) IMPLANT
SUT BONE WAX W31G (SUTURE) ×4 IMPLANT
SUT ETHIBOND X763 2 0 SH 1 (SUTURE) ×8 IMPLANT
SUT MNCRL AB 3-0 PS2 18 (SUTURE) ×8 IMPLANT
SUT MNCRL AB 4-0 PS2 18 (SUTURE) IMPLANT
SUT PDS AB 1 CTX 36 (SUTURE) ×8 IMPLANT
SUT PROLENE 2 0 SH DA (SUTURE) IMPLANT
SUT PROLENE 3 0 SH DA (SUTURE) ×8 IMPLANT
SUT PROLENE 3 0 SH1 36 (SUTURE) IMPLANT
SUT PROLENE 4 0 RB 1 (SUTURE)
SUT PROLENE 4 0 SH DA (SUTURE) ×4 IMPLANT
SUT PROLENE 4-0 RB1 .5 CRCL 36 (SUTURE) IMPLANT
SUT PROLENE 5 0 C 1 36 (SUTURE) IMPLANT
SUT PROLENE 6 0 C 1 30 (SUTURE) IMPLANT
SUT PROLENE 7.0 RB 3 (SUTURE) ×52 IMPLANT
SUT PROLENE 8 0 BV175 6 (SUTURE) IMPLANT
SUT PROLENE BLUE 7 0 (SUTURE) ×4 IMPLANT
SUT PROLENE POLY MONO (SUTURE) ×8 IMPLANT
SUT SILK  1 MH (SUTURE) ×1
SUT SILK 1 MH (SUTURE) ×3 IMPLANT
SUT STEEL 6MS V (SUTURE) IMPLANT
SUT STEEL STERNAL CCS#1 18IN (SUTURE) IMPLANT
SUT STEEL SZ 6 DBL 3X14 BALL (SUTURE) IMPLANT
SUT VIC AB 1 CTX 36 (SUTURE)
SUT VIC AB 1 CTX36XBRD ANBCTR (SUTURE) IMPLANT
SUT VIC AB 2-0 CT1 27 (SUTURE) ×1
SUT VIC AB 2-0 CT1 TAPERPNT 27 (SUTURE) ×3 IMPLANT
SUT VIC AB 2-0 CTX 27 (SUTURE) IMPLANT
SUT VIC AB 3-0 SH 27 (SUTURE)
SUT VIC AB 3-0 SH 27X BRD (SUTURE) IMPLANT
SUT VIC AB 3-0 X1 27 (SUTURE) ×4 IMPLANT
SUT VICRYL 4-0 PS2 18IN ABS (SUTURE) IMPLANT
SYSTEM SAHARA CHEST DRAIN ATS (WOUND CARE) ×4 IMPLANT
TAPE CLOTH SURG 4X10 WHT LF (GAUZE/BANDAGES/DRESSINGS) ×12 IMPLANT
TOWEL GREEN STERILE (TOWEL DISPOSABLE) ×4 IMPLANT
TOWEL GREEN STERILE FF (TOWEL DISPOSABLE) ×4 IMPLANT
TRAY FOLEY SLVR 16FR TEMP STAT (SET/KITS/TRAYS/PACK) ×4 IMPLANT
TUBING INSUFFLATION (TUBING) ×4 IMPLANT
UNDERPAD 30X30 (UNDERPADS AND DIAPERS) ×4 IMPLANT
WATER STERILE IRR 1000ML POUR (IV SOLUTION) ×8 IMPLANT

## 2018-06-25 NOTE — Anesthesia Procedure Notes (Signed)
Arterial Line Insertion Start/End11/27/2019 7:05 AM, 06/25/2018 7:20 AM Performed by: Yeison Sippel T, Immunologist, CRNA  Patient location: Pre-op. Preanesthetic checklist: patient identified, IV checked, site marked, risks and benefits discussed, surgical consent, monitors and equipment checked and pre-op evaluation Lidocaine 1% used for infiltration and patient sedated Left, radial was placed Catheter size: 20 G Hand hygiene performed  and maximum sterile barriers used   Attempts: 3 Procedure performed without using ultrasound guided technique. Following insertion, dressing applied and Biopatch. Post procedure assessment: normal  Patient tolerated the procedure well with no immediate complications.

## 2018-06-25 NOTE — Progress Notes (Signed)
  Echocardiogram Echocardiogram Transesophageal has been performed.  Joshua Branch 06/25/2018, 8:38 AM

## 2018-06-25 NOTE — Anesthesia Procedure Notes (Signed)
Procedure Name: Intubation Date/Time: 06/25/2018 8:15 AM Performed by: Lizann Edelman T, CRNA Pre-anesthesia Checklist: Patient identified, Emergency Drugs available, Suction available and Patient being monitored Patient Re-evaluated:Patient Re-evaluated prior to induction Oxygen Delivery Method: Circle system utilized Preoxygenation: Pre-oxygenation with 100% oxygen Induction Type: IV induction Ventilation: Mask ventilation without difficulty and Oral airway inserted - appropriate to patient size Laryngoscope Size: Miller and 3 Grade View: Grade I Tube type: Oral Tube size: 8.0 mm Number of attempts: 2 Airway Equipment and Method: Patient positioned with wedge pillow and Stylet Placement Confirmation: ETT inserted through vocal cords under direct vision,  positive ETCO2 and breath sounds checked- equal and bilateral Secured at: 23 cm Tube secured with: Tape Dental Injury: Teeth and Oropharynx as per pre-operative assessment

## 2018-06-25 NOTE — Progress Notes (Signed)
RN stated patient starting to wake up. Attempted to start rapid wean with Rate of 4 and Fio2 40%. Pt did not tolerate and was placed back on Rate of 12 and FIo2 50%.

## 2018-06-25 NOTE — Procedures (Signed)
Extubation Procedure Note  Patient Details:   Name: Joshua Branch DOB: [birth year] MRN: [id number]   Airway Documentation:    Vent end date: 06/25/18 Vent end time: 1655   Evaluation  O2 sats: stable throughout Complications: No apparent complications Patient did tolerate procedure well. Bilateral Breath Sounds: Clear, Diminished   Yes  NIF-40 FVC-1.1L Placed on 4l/min [place]  Revonda Standard 06/25/2018, 4:59 PM

## 2018-06-25 NOTE — Brief Op Note (Addendum)
06/22/2018 - 06/25/2018  11:28 AM  PATIENT:  Joshua Branch  57 y.o. male  PRE-OPERATIVE DIAGNOSIS:  CAD  POST-OPERATIVE DIAGNOSIS:  CAD  PROCEDURE:  Procedure(s): CORONARY ARTERY BYPASS GRAFTING (CABG) TIMES USING LEFT INTERNAL MAMMARY ARTERY AND RIGHT ENDOSCOPICALLY HARVESTED SAPHENOUS VEIN (N/A) TRANSESOPHAGEAL ECHOCARDIOGRAM (TEE) (N/A) ENDOVEIN HARVEST OF GREATER SAPHENOUS VEIN (Right) SVG-OM2 SVG-DIAG SVG-PD LIMA-LAD  SURGEON:  Surgeon(s) and Role:    * Rexene Alberts, MD - Primary  PHYSICIAN ASSISTANT: WAYNE GOLD PA-C   ANESTHESIA:   general  EBL:  191  COMPLICATIONS: NO KNOWN   BLOOD ADMINISTERED:none  DRAINS: PLEURAL AND PERICARDIAL CHEST TUBES   LOCAL MEDICATIONS USED:  NONE  SPECIMEN:  No Specimen  DISPOSITION OF SPECIMEN:  N/A  COUNTS:  YES  TOURNIQUET:  * No tourniquets in log *  DICTATION: .Dragon Dictation  PLAN OF CARE: Admit to inpatient   PATIENT DISPOSITION:  ICU - intubated and hemodynamically stable.   Delay start of Pharmacological VTE agent (>24hrs) due to surgical blood loss or risk of bleeding: yes

## 2018-06-25 NOTE — Op Note (Signed)
CARDIOTHORACIC SURGERY OPERATIVE NOTE  Date of Procedure: 06/25/2018  Preoperative Diagnosis:   Severe 3-vessel Coronary Artery Disease  S/P Acute Myocardial Infarction  Postoperative Diagnosis: Same  Procedure:    Coronary Artery Bypass Grafting x 4   Left Internal Mammary Artery to Distal Left Anterior Descending Coronary Artery  Saphenous Vein Graft to Posterior Descending Coronary Artery  Saphenous Vein Graft to Second Obtuse Marginal Branch of Left Circumflex Coronary Artery  Sapheonous Vein Graft to Diagonal Branch Coronary Artery  Endoscopic Vein Harvest from Right Thigh and Lower Leg  Surgeon: Valentina Gu. Roxy Manns, MD  Assistant: John Giovanni, PA-C  Anesthesia: Roderic Palau, MD  Operative Findings:  Mild left ventricular systolic dysfunction  Good quality left internal mammary artery conduit  Good quality saphenous vein conduit  Diffuse CAD with poor quality target vessels for grafting  Distal portion of anterior wall appeared viable and distal LAD was adequate target vessel for grafting     BRIEF CLINICAL NOTE AND INDICATIONS FOR SURGERY  Patient is a 57 year old male with no previous history of coronary artery disease but risk factors notable for history of hypertension, poorly controlled type 2 diabetes, hyperlipidemia, and a family history of coronary artery disease who was admitted with delayed presentation of acute anterior apical myocardial infarction and has been referred for surgical consultation to discuss treatment options of for management of severe three-vessel coronary artery disease.  Patient states that several months ago he began to experience postprandial sharp substernal chest pain.  He eventually underwent upper GI endoscopy and colonoscopy and was told that he probably had reflux.  He has been taking omeprazole ever since.  Symptoms seem to have resolved until Friday morning when he developed sudden onset of severe sharp substernal chest  pain.  This pain was somewhat different than previous episodes and considerably more severe.  Symptoms persisted although waxed and waned in severity over the next 48 hours.  Symptoms were not associated with any shortness of breath, diaphoresis, or radiating symptoms.  The patient was evaluated at [facility] where her EKG revealed sinus tachycardia with subtle ST segment elevation across the anterior and lateral leads.  Troponin was markedly elevated at 12 ng/mL.  Chest pain resolved with administration of oxygen, nitrates, heparin, and morphine.  The patient was transferred to [facility] where he underwent diagnostic cardiac catheterization by Dr. Virgina Jock.  The patient was found to have severe three-vessel coronary artery disease with subtotal occlusion of the distal left anterior descending coronary artery.  There was mild left ventricular systolic dysfunction.  Cardiothoracic surgical consultation was requested.  The patient has been seen in consultation and counseled at length regarding the indications, risks and potential benefits of surgery.  All questions have been answered, and the patient provides full informed consent for the operation as described.    DETAILS OF THE OPERATIVE PROCEDURE  Preparation:  The patient is brought to the operating room on the above mentioned date and central monitoring was established by the anesthesia team including placement of Swan-Ganz catheter and radial arterial line. The patient is placed in the supine position on the operating table.  Intravenous antibiotics are administered. General endotracheal anesthesia is induced uneventfully. A Foley catheter is placed.  Baseline transesophageal echocardiogram was performed.  Findings were notable for low normal LV systolic function.  There was hypokinesis of the posterolateral wall.  LV ejection fraction was estimated 50%  [name] chest, abdomen, both groins, and both lower  extremities are prepared  and draped in a sterile manner. A time out procedure is performed.   Surgical Approach and Conduit Harvest:  A median sternotomy incision was performed and the left internal mammary artery is dissected from the chest wall and prepared for bypass grafting. The left internal mammary artery is notably good quality conduit. Simultaneously, the greater saphenous vein is obtained from the patient's right thigh and leg using endoscopic vein harvest technique. The saphenous vein is notably good quality conduit. After removal of the saphenous vein, the small surgical incisions in the lower extremity are closed with absorbable suture. Following systemic heparinization, the left internal mammary artery was transected distally noted to have excellent flow.   Extracorporeal Cardiopulmonary Bypass and Myocardial Protection:  The pericardium is opened. The ascending aorta is normal in appearance. The ascending aorta and the right atrium are cannulated for cardiopulmonary bypass.  Adequate heparinization is verified.    A retrograde cardioplegia cannula is placed through the right atrium into the coronary sinus.  The entire pre-bypass portion of the operation was notable for stable hemodynamics.  Cardiopulmonary bypass was begun and the surface of the heart is inspected. Distal target vessels are selected for coronary artery bypass grafting. A cardioplegia cannula is placed in the ascending aorta.  A temperature probe was placed in the interventricular septum.  The patient is allowed to cool passively to [facility] systemic temperature.  The aortic cross clamp is applied and cold blood cardioplegia is delivered initially in an antegrade fashion through the aortic root.   Supplemental cardioplegia is given retrograde through the coronary sinus catheter.  Iced saline slush is applied for topical hypothermia.  The initial cardioplegic arrest is rapid with early diastolic arrest.  Repeat doses of  cardioplegia are administered intermittently throughout the entire cross clamp portion of the operation through the aortic root, through the coronary sinus catheter, and through subsequently placed vein grafts in order to maintain completely flat electrocardiogram and septal myocardial temperature below 15C.  Myocardial protection was felt to be excellent.  Coronary Artery Bypass Grafting:   The posterior descending branch of the right coronary artery was grafted using a reversed saphenous vein graft in an end-to-side fashion.  At the site of distal anastomosis the target vessel was poor quality and measured approximately 1.3 mm in diameter.  The second obtuse marginal branch of the left circumflex coronary artery was grafted using a reversed saphenous vein graft in an end-to-side fashion.  At the site of distal anastomosis the target vessel was poor quality and measured approximately 1.2 mm in diameter.  The first obtuse marginal branch was explored but diffusely diseased with was no suitable site for grafting.  The diagonal branch of the left anterior descending coronary artery was grafted using a reversed saphenous vein graft in an end-to-side fashion.  At the site of distal anastomosis the target vessel was very poor quality and measured approximately 1.0 mm in diameter.  The distal left anterior coronary artery was grafted with the left internal mammary artery in an end-to-side fashion.  At the site of distal anastomosis the target vessel was poor quality and measured approximately 1.8 mm in diameter.  All proximal vein graft anastomoses were placed directly to the ascending aorta prior to removal of the aortic cross clamp.  The septal myocardial temperature rose rapidly after reperfusion of the left internal mammary artery graft.  The aortic cross clamp was removed after a total cross clamp time of 84 minutes.   Procedure Completion:  All proximal and distal coronary  anastomoses were  inspected for hemostasis and appropriate graft orientation. Epicardial pacing wires are fixed to the right ventricular outflow tract and to the right atrial appendage. The patient is rewarmed to 37C temperature. The patient is weaned and disconnected from cardiopulmonary bypass.  [name] rhythm at separation from bypass was sinus.  The patient was weaned from cardiopulmonary bypass without any inotropic support. Total cardiopulmonary bypass time for the operation was 115 minutes.  Followup transesophageal echocardiogram performed after separation from bypass revealed no changes from the preoperative exam.  The aortic and venous cannula were removed uneventfully. Protamine was administered to reverse the anticoagulation. The mediastinum and pleural space were inspected for hemostasis and irrigated with saline solution. The mediastinum and the left pleural space were drained using 3 chest tubes placed through separate stab incisions inferiorly.  The soft tissues anterior to the aorta were reapproximated loosely. The sternum is closed with double strength sternal wire. The soft tissues anterior to the sternum were closed in multiple layers and the skin is closed with a running subcuticular skin closure.  The post-bypass portion of the operation was notable for stable rhythm and hemodynamics.  No blood products were administered during the operation.   Disposition:  The patient tolerated the procedure well and is transported to the surgical intensive care in stable condition. There are no intraoperative complications. All sponge instrument and needle counts are verified correct at completion of the operation.    Valentina Gu. Roxy Manns MD 06/25/2018 12:56 PM

## 2018-06-25 NOTE — Progress Notes (Signed)
[facility] BRIEF SICU PROGRESS NOTE  Day of Surgery  S/P Procedure(s) (LRB): CORONARY ARTERY BYPASS GRAFTING (CABG) TIMES FOUR USING LEFT INTERNAL MAMMARY ARTERY AND RIGHT ENDOSCOPICALLY HARVESTED SAPHENOUS VEIN (N/A) TRANSESOPHAGEAL ECHOCARDIOGRAM (TEE) (N/A) ENDOVEIN HARVEST OF GREATER SAPHENOUS VEIN (Right)   Starting to wake on vent NSR w/ stable hemodynamics, no drips O2 sats 100% Chest tube output low UOP 75-100 mL/hr Labs okay  Plan: Continue routine early postop  Rexene Alberts, MD 06/25/2018 3:18 PM

## 2018-06-25 NOTE — Plan of Care (Signed)
  Problem: Pain Managment: Goal: General experience of comfort will improve Outcome: Progressing   Problem: Cardiovascular: Goal: Ability to achieve and maintain adequate cardiovascular perfusion will improve Outcome: Progressing   Problem: Cardiovascular: Goal: Vascular access site(s) Level 0-1 will be maintained Outcome: Progressing   Problem: Clinical Measurements: Goal: Postoperative complications will be avoided or minimized Outcome: Progressing   Problem: Cardiac: Goal: Will achieve and/or maintain hemodynamic stability Outcome: Progressing   Problem: Respiratory: Goal: Respiratory status will improve Outcome: Progressing   Problem: Urinary Elimination: Goal: Ability to achieve and maintain adequate renal perfusion and functioning will improve Outcome: Progressing

## 2018-06-25 NOTE — Progress Notes (Signed)
Rapid weaning protocol 

## 2018-06-25 NOTE — Anesthesia Postprocedure Evaluation (Signed)
Anesthesia Post Note  Patient: DEQUANTE TREMAINE  Procedure(s) Performed: CORONARY ARTERY BYPASS GRAFTING (CABG) TIMES FOUR USING LEFT INTERNAL MAMMARY ARTERY AND RIGHT ENDOSCOPICALLY HARVESTED SAPHENOUS VEIN (N/A Chest) TRANSESOPHAGEAL ECHOCARDIOGRAM (TEE) (N/A ) ENDOVEIN HARVEST OF GREATER SAPHENOUS VEIN (Right Leg Lower)     Patient location during evaluation: SICU Anesthesia Type: General Level of consciousness: sedated Pain management: pain level controlled Vital Signs Assessment: post-procedure vital signs reviewed and stable Respiratory status: patient remains intubated per anesthesia plan Cardiovascular status: stable Postop Assessment: no apparent nausea or vomiting Anesthetic complications: no    Last Vitals:  Vitals:   06/25/18 1335 06/25/18 1355  BP:    Pulse:    Resp: 12 14  Temp: 36.5 C (!) 36.4 C  SpO2: 96% 98%    Last Pain:  Vitals:   06/25/18 1330  TempSrc: (P) Core  PainSc:                  Aristides Luckey,W. EDMOND

## 2018-06-25 NOTE — Anesthesia Procedure Notes (Signed)
Central Venous Catheter Insertion Performed by: Roderic Palau, MD, anesthesiologist Start/End11/27/2019 6:40 AM, 06/25/2018 6:55 AM Patient location: Pre-op. Preanesthetic checklist: patient identified, IV checked, site marked, risks and benefits discussed, surgical consent, monitors and equipment checked, pre-op evaluation, timeout performed and anesthesia consent Position: Trendelenburg Lidocaine 1% used for infiltration and patient sedated Hand hygiene performed , maximum sterile barriers used  and Seldinger technique used Catheter size: 9 Fr Total catheter length 10. Central line was placed.MAC introducer Swan type:thermodilution Procedure performed using ultrasound guided technique. Ultrasound Notes:anatomy identified, needle tip was noted to be adjacent to the nerve/plexus identified, no ultrasound evidence of intravascular and/or intraneural injection and image(s) printed for medical record Attempts: 1 Following insertion, line sutured, dressing applied and Biopatch. Post procedure assessment: blood return through all ports, free fluid flow and no air  Patient tolerated the procedure well with no immediate complications.

## 2018-06-25 NOTE — Anesthesia Procedure Notes (Signed)
Central Venous Catheter Insertion Performed by: Roderic Palau, MD, anesthesiologist Start/End11/27/2019 6:40 AM, 06/25/2018 6:55 AM Patient location: Pre-op. Preanesthetic checklist: patient identified, IV checked, site marked, risks and benefits discussed, surgical consent, monitors and equipment checked, pre-op evaluation, timeout performed and anesthesia consent Hand hygiene performed  and maximum sterile barriers used  PA cath was placed.Swan type:thermodilution PA Cath depth:50 Procedure performed without using ultrasound guided technique. Attempts: 1 Patient tolerated the procedure well with no immediate complications.

## 2018-06-25 NOTE — Progress Notes (Signed)
      [address]       [place] [address]             [phone]     CARDIOTHORACIC SURGERY PROGRESS NOTE  Subjective: SHERRILL MCKAMIE has been scheduled for Procedure(s): CORONARY ARTERY BYPASS GRAFTING (CABG) (N/A) TRANSESOPHAGEAL ECHOCARDIOGRAM (TEE) (N/A) today.   Objective: Vital signs in last 24 hours: Temp:  [98.4 F (36.9 C)-99.3 F (37.4 C)] 98.5 F (36.9 C) (11/27 0345) Pulse Rate:  [98] 98 (11/26 1124) Cardiac Rhythm: Normal sinus rhythm (11/27 0000) Resp:  [15-25] 20 (11/27 0300) BP: (124-137)/(81-97) 132/90 (11/27 0600) SpO2:  [91 %-93 %] 91 % (11/27 0300) Weight:  [109.8 kg] 109.8 kg (11/26 1700)  Physical Exam: Unchanged from previously   Intake/Output from previous day: 11/26 0701 - 11/27 0700 In: 1127.5 [P.O.:670; I.V.:457.5] Out: 0  Intake/Output this shift: Total I/O In: 164.2 [I.V.:164.2] Out: 0   Lab Results: Recent Labs    06/24/18 1033 06/25/18 0424  WBC 12.4* 10.3  HGB 13.3 13.3  HCT 40.6 38.8*  PLT 288 259   BMET:  Recent Labs    06/24/18 1033 06/25/18 0424  NA 132* 133*  K 3.6 4.1  CL 96* 99  CO2 24 26  GLUCOSE 205* 185*  BUN 17 17  CREATININE 1.10 1.20  CALCIUM 8.6* 8.5*    CBG (last 3)  Recent Labs    06/24/18 1135 06/24/18 1554 06/24/18 2040  GLUCAP 199* 166* 201*   PT/INR:   Recent Labs    06/23/18 0415  LABPROT 15.6*  INR 1.25    Assessment/Plan:   The various methods of treatment have been discussed with the patient. After consideration of the risks, benefits and treatment options the patient has consented to the planned procedure.   The patient has been seen and labs reviewed. There are no changes in the patient's condition to prevent proceeding with the planned procedure today.   Rexene Alberts, MD 06/25/2018 6:44 AM

## 2018-06-25 NOTE — Transfer of Care (Signed)
Immediate Anesthesia Transfer of Care Note  [name]  Procedure(s) Performed: CORONARY ARTERY BYPASS GRAFTING (CABG) TIMES FOUR USING LEFT INTERNAL MAMMARY ARTERY AND RIGHT ENDOSCOPICALLY HARVESTED SAPHENOUS VEIN (N/A Chest) TRANSESOPHAGEAL ECHOCARDIOGRAM (TEE) (N/A ) ENDOVEIN HARVEST OF GREATER SAPHENOUS VEIN (Right Leg Lower)  Patient Location: ICU  Anesthesia Type:General  Level of Consciousness: Patient remains intubated per anesthesia plan  Airway & Oxygen Therapy: Patient remains intubated per anesthesia plan and Patient placed on Ventilator (see vital sign flow sheet for setting)  Post-op Assessment: Report given to RN and Post -op Vital signs reviewed and stable  Post vital signs: Reviewed and stable  Last Vitals:  Vitals Value Taken Time  BP    Temp    Pulse    Resp    SpO2      Last Pain:  Vitals:   06/25/18 0345  TempSrc: Oral  PainSc:          Complications: No apparent anesthesia complications

## 2018-06-26 ENCOUNTER — Inpatient Hospital Stay (HOSPITAL_COMMUNITY): Payer: 59

## 2018-06-26 LAB — POCT I-STAT, CHEM 8
BUN: 17 mg/dL (ref 6–20)
CREATININE: 0.7 mg/dL (ref 0.61–1.24)
Calcium, Ion: 1.05 mmol/L — ABNORMAL LOW (ref 1.15–1.40)
Chloride: 94 mmol/L — ABNORMAL LOW (ref 98–111)
Glucose, Bld: 223 mg/dL — ABNORMAL HIGH (ref 70–99)
HEMATOCRIT: 36 % — AB (ref 39.0–52.0)
Hemoglobin: 12.2 g/dL — ABNORMAL LOW (ref 13.0–17.0)
Potassium: 4.4 mmol/L (ref 3.5–5.1)
Sodium: 131 mmol/L — ABNORMAL LOW (ref 135–145)
TCO2: 26 mmol/L (ref 22–32)

## 2018-06-26 LAB — GLUCOSE, CAPILLARY
GLUCOSE-CAPILLARY: 103 mg/dL — AB (ref 70–99)
GLUCOSE-CAPILLARY: 192 mg/dL — AB (ref 70–99)
Glucose-Capillary: 100 mg/dL — ABNORMAL HIGH (ref 70–99)
Glucose-Capillary: 106 mg/dL — ABNORMAL HIGH (ref 70–99)
Glucose-Capillary: 108 mg/dL — ABNORMAL HIGH (ref 70–99)
Glucose-Capillary: 110 mg/dL — ABNORMAL HIGH (ref 70–99)
Glucose-Capillary: 113 mg/dL — ABNORMAL HIGH (ref 70–99)
Glucose-Capillary: 116 mg/dL — ABNORMAL HIGH (ref 70–99)
Glucose-Capillary: 208 mg/dL — ABNORMAL HIGH (ref 70–99)
Glucose-Capillary: 219 mg/dL — ABNORMAL HIGH (ref 70–99)
Glucose-Capillary: 245 mg/dL — ABNORMAL HIGH (ref 70–99)
Glucose-Capillary: 92 mg/dL (ref 70–99)

## 2018-06-26 LAB — CREATININE, SERUM
Creatinine, Ser: 0.94 mg/dL (ref 0.61–1.24)
GFR calc Af Amer: >60 mL/min (ref >60)
GFR calc non Af Amer: >60 mL/min (ref >60)

## 2018-06-26 LAB — BASIC METABOLIC PANEL
Anion gap: 6 (ref 5–15)
BUN: 17 mg/dL (ref 6–20)
CALCIUM: 7.2 mg/dL — AB (ref 8.9–10.3)
CO2: 21 mmol/L — AB (ref 22–32)
Chloride: 104 mmol/L (ref 98–111)
Creatinine, Ser: 0.93 mg/dL (ref 0.61–1.24)
GFR calc Af Amer: >60 mL/min (ref >60)
GLUCOSE: 108 mg/dL — AB (ref 70–99)
Potassium: 4.3 mmol/L (ref 3.5–5.1)
Sodium: 131 mmol/L — ABNORMAL LOW (ref 135–145)

## 2018-06-26 LAB — CBC
HCT: 35.3 % — ABNORMAL LOW (ref 39.0–52.0)
HCT: 34.8 % — ABNORMAL LOW (ref 39.0–52.0)
HEMATOCRIT: 33.5 % — AB (ref 39.0–52.0)
Hemoglobin: 11.9 g/dL — ABNORMAL LOW (ref 13.0–17.0)
Hemoglobin: 10.8 g/dL — ABNORMAL LOW (ref 13.0–17.0)
Hemoglobin: 11.9 g/dL — ABNORMAL LOW (ref 13.0–17.0)
MCH: 30.5 pg (ref 26.0–34.0)
MCH: 29.7 pg (ref 26.0–34.0)
MCH: 31.2 pg (ref 26.0–34.0)
MCHC: 33.7 g/dL (ref 30.0–36.0)
MCHC: 32.2 g/dL (ref 30.0–36.0)
MCHC: 34.2 g/dL (ref 30.0–36.0)
MCV: 90.5 fL (ref 80.0–100.0)
MCV: 92 fL (ref 80.0–100.0)
MCV: 91.1 fL (ref 80.0–100.0)
PLATELETS: 206 10*3/uL (ref 150–400)
PLATELETS: 201 10*3/uL (ref 150–400)
Platelets: 190 10*3/uL (ref 150–400)
RBC: 3.9 MIL/uL — ABNORMAL LOW (ref 4.22–5.81)
RBC: 3.64 MIL/uL — ABNORMAL LOW (ref 4.22–5.81)
RBC: 3.82 MIL/uL — ABNORMAL LOW (ref 4.22–5.81)
RDW: 11.2 % — AB (ref 11.5–15.5)
RDW: 11.4 % — AB (ref 11.5–15.5)
RDW: 11.6 % (ref 11.5–15.5)
WBC: 18.4 10*3/uL — AB (ref 4.0–10.5)
WBC: 11.7 10*3/uL — ABNORMAL HIGH (ref 4.0–10.5)
WBC: 13.7 10*3/uL — ABNORMAL HIGH (ref 4.0–10.5)
nRBC: 0 % (ref 0.0–0.2)
nRBC: 0 % (ref 0.0–0.2)
nRBC: 0 % (ref 0.0–0.2)

## 2018-06-26 LAB — MAGNESIUM
MAGNESIUM: 2.2 mg/dL (ref 1.7–2.4)
Magnesium: 3 mg/dL — ABNORMAL HIGH (ref 1.7–2.4)

## 2018-06-26 MED ORDER — INSULIN DETEMIR 100 UNIT/ML ~~LOC~~ SOLN
20.0000 [IU] | Freq: Two times a day (BID) | SUBCUTANEOUS | Status: DC
  Administered 2018-06-26: 20 [IU] via SUBCUTANEOUS
  Filled 2018-06-26 (×2): qty 0.2

## 2018-06-26 MED ORDER — ASPIRIN EC 325 MG PO TBEC
325.0000 mg | DELAYED_RELEASE_TABLET | Freq: Every day | ORAL | Status: AC
  Administered 2018-06-26: 325 mg via ORAL
  Filled 2018-06-26: qty 1

## 2018-06-26 MED ORDER — ASPIRIN EC 81 MG PO TBEC
81.0000 mg | DELAYED_RELEASE_TABLET | Freq: Every day | ORAL | Status: DC
  Administered 2018-06-27 – 2018-06-29 (×3): 81 mg via ORAL
  Filled 2018-06-26 (×3): qty 1

## 2018-06-26 MED ORDER — INSULIN ASPART 100 UNIT/ML ~~LOC~~ SOLN
0.0000 [IU] | SUBCUTANEOUS | Status: DC
  Administered 2018-06-26 (×4): 8 [IU] via SUBCUTANEOUS

## 2018-06-26 MED ORDER — ENOXAPARIN SODIUM 40 MG/0.4ML ~~LOC~~ SOLN
40.0000 mg | Freq: Every day | SUBCUTANEOUS | Status: DC
  Administered 2018-06-27 – 2018-06-28 (×2): 40 mg via SUBCUTANEOUS
  Filled 2018-06-26 (×2): qty 0.4

## 2018-06-26 MED ORDER — FUROSEMIDE 10 MG/ML IJ SOLN
20.0000 mg | Freq: Four times a day (QID) | INTRAMUSCULAR | Status: AC
  Administered 2018-06-26 (×3): 20 mg via INTRAVENOUS
  Filled 2018-06-26 (×3): qty 2

## 2018-06-26 MED ORDER — INSULIN DETEMIR 100 UNIT/ML ~~LOC~~ SOLN
24.0000 [IU] | Freq: Two times a day (BID) | SUBCUTANEOUS | Status: DC
  Administered 2018-06-26 – 2018-06-27 (×3): 24 [IU] via SUBCUTANEOUS
  Filled 2018-06-26 (×4): qty 0.24

## 2018-06-26 MED ORDER — CLOPIDOGREL BISULFATE 75 MG PO TABS
75.0000 mg | ORAL_TABLET | Freq: Every day | ORAL | Status: DC
  Administered 2018-06-27 – 2018-06-29 (×3): 75 mg via ORAL
  Filled 2018-06-26 (×3): qty 1

## 2018-06-26 NOTE — Progress Notes (Signed)
[address]       [place] [address]             [phone]        CARDIOTHORACIC SURGERY PROGRESS NOTE   R1 Day Post-Op Procedure(s) (LRB): CORONARY ARTERY BYPASS GRAFTING (CABG) TIMES FOUR USING LEFT INTERNAL MAMMARY ARTERY AND RIGHT ENDOSCOPICALLY HARVESTED SAPHENOUS VEIN (N/A) TRANSESOPHAGEAL ECHOCARDIOGRAM (TEE) (N/A) ENDOVEIN HARVEST OF GREATER SAPHENOUS VEIN (Right)  Subjective: Looks great.  Minimal soreness in chest.  No SOB.  Tolerating clear liquids and asking for food  Objective: Vital signs: BP Readings from Last 1 Encounters:  06/26/18 120/83   Pulse Readings from Last 1 Encounters:  06/26/18 80   Resp Readings from Last 1 Encounters:  06/26/18 19   Temp Readings from Last 1 Encounters:  06/26/18 99 F (37.2 C)    Hemodynamics: PAP: (13-29)/(6-20) 13/6 CO:  [4.2 L/min-6.1 L/min] 4.9 L/min CI:  [1.8 L/min/m2-2.6 L/min/m2] 2.1 L/min/m2  Physical Exam:  Rhythm:   sinus  Breath sounds: clear  Heart sounds:  RRR  Incisions:  Dressings dry, intact  Abdomen:  Soft, non-distended, non-tender  Extremities:  Warm, well-perfused  Chest tubes:  Very low volume thin serosanguinous output, no air leak    Intake/Output from previous day: 11/27 0701 - 11/28 0700 In: 7487.7 [P.O.:181; I.V.:4536.4; Blood:960; IV Piggyback:1810.3] Out: 2708 [Urine:1775; Blood:610; Chest Tube:323] Intake/Output this shift: Total I/O In: -  Out: 60 [Urine:60]  Lab Results:  CBC: Recent Labs    06/25/18 1937 06/25/18 1939 06/26/18 0313  WBC 11.5*  --  11.7*  HGB 11.3* 10.2* 10.8*  HCT 33.5* 30.0* 33.5*  PLT 179  --  190    BMET:  Recent Labs    06/25/18 0424  06/25/18 1939 06/26/18 0313  NA 133*   < > 135 131*  K 4.1   < > 4.8 4.3  CL 99   < > 104 104  CO2 26  --   --  21*  GLUCOSE 185*   < > 119* 108*  BUN 17   < > 18 17  CREATININE 1.20   < > 0.70 0.93  CALCIUM 8.5*  --   --  7.2*   < > = values in this interval not displayed.     PT/INR:   Recent Labs    06/25/18 1330  LABPROT 16.9*  INR 1.39    CBG (last 3)  Recent Labs    06/26/18 0514 06/26/18 0607 06/26/18 0728  GLUCAP 100* 92 106*    ABG    Component Value Date/Time   PHART 7.351 06/25/2018 1800   PCO2ART 40.6 06/25/2018 1800   PO2ART 117.0 (H) 06/25/2018 1800   HCO3 22.5 06/25/2018 1800   TCO2 23 06/25/2018 1939   ACIDBASEDEF 3.0 (H) 06/25/2018 1800   O2SAT 98.0 06/25/2018 1800    CXR: Looks good. Minimal bibasilar ATX  EKG: NSR w/out acute ischemic changes    Assessment/Plan: S/P Procedure(s) (LRB): CORONARY ARTERY BYPASS GRAFTING (CABG) TIMES FOUR USING LEFT INTERNAL MAMMARY ARTERY AND RIGHT ENDOSCOPICALLY HARVESTED SAPHENOUS VEIN (N/A) TRANSESOPHAGEAL ECHOCARDIOGRAM (TEE) (N/A) ENDOVEIN HARVEST OF GREATER SAPHENOUS VEIN (Right)  Doing very well POD1 Maintaining NSR w/ stable hemodynamics, no drips Breathing comfortably w/ O2 sats 95-97% on 4 L/min, CXR looks good Expected post op acute blood loss anemia, mild Expected post op atelectasis, mild Expected post op volume excess, weight reportedly 9-10 kg > preop, UOP adequate Type II diabetes mellitus, excellent glycemic control  on insulin drip Preop S/P acute ST segment elevation myocardial infarction Preop DMII with complications Non-healing ulcers both feet   Mobilize  Diuresis  D/C lines and tubes  Add levemir and transition off insulin drip  DAPT  Beta blocker  Statin  Rexene Alberts, MD 06/26/2018 8:09 AM

## 2018-06-26 NOTE — Progress Notes (Signed)
Patient examined and record reviewed.Hemodynamics stable,labs satisfactory.Patient had stable day.Continue current care. Increase levemir for CBG 200 Joshua Branch 06/26/2018

## 2018-06-27 ENCOUNTER — Inpatient Hospital Stay (HOSPITAL_COMMUNITY): Payer: 59

## 2018-06-27 ENCOUNTER — Telehealth: Payer: Self-pay | Admitting: Medical

## 2018-06-27 ENCOUNTER — Encounter (HOSPITAL_COMMUNITY): Payer: Self-pay | Admitting: Thoracic Surgery (Cardiothoracic Vascular Surgery)

## 2018-06-27 LAB — CBC
HCT: 34.9 % — ABNORMAL LOW (ref 39.0–52.0)
Hemoglobin: 11.3 g/dL — ABNORMAL LOW (ref 13.0–17.0)
MCH: 29.7 pg (ref 26.0–34.0)
MCHC: 32.4 g/dL (ref 30.0–36.0)
MCV: 91.6 fL (ref 80.0–100.0)
Platelets: 242 10*3/uL (ref 150–400)
RBC: 3.81 MIL/uL — ABNORMAL LOW (ref 4.22–5.81)
RDW: 11.6 % (ref 11.5–15.5)
WBC: 12.5 10*3/uL — ABNORMAL HIGH (ref 4.0–10.5)
nRBC: 0 % (ref 0.0–0.2)

## 2018-06-27 LAB — GLUCOSE, CAPILLARY
GLUCOSE-CAPILLARY: 111 mg/dL — AB (ref 70–99)
GLUCOSE-CAPILLARY: 168 mg/dL — AB (ref 70–99)
Glucose-Capillary: 131 mg/dL — ABNORMAL HIGH (ref 70–99)
Glucose-Capillary: 220 mg/dL — ABNORMAL HIGH (ref 70–99)
Glucose-Capillary: 152 mg/dL — ABNORMAL HIGH (ref 70–99)

## 2018-06-27 LAB — BASIC METABOLIC PANEL
Anion gap: 5 (ref 5–15)
BUN: 16 mg/dL (ref 6–20)
CO2: 28 mmol/L (ref 22–32)
Calcium: 7.5 mg/dL — ABNORMAL LOW (ref 8.9–10.3)
Chloride: 98 mmol/L (ref 98–111)
Creatinine, Ser: 0.81 mg/dL (ref 0.61–1.24)
GFR calc Af Amer: >60 mL/min (ref >60)
GFR calc non Af Amer: >60 mL/min (ref >60)
GLUCOSE: 111 mg/dL — AB (ref 70–99)
Potassium: 3.4 mmol/L — ABNORMAL LOW (ref 3.5–5.1)
Sodium: 131 mmol/L — ABNORMAL LOW (ref 135–145)

## 2018-06-27 MED ORDER — LISINOPRIL-HYDROCHLOROTHIAZIDE 10-12.5 MG PO TABS: 1.0000 | ORAL_TABLET | Freq: Every day | ORAL | Status: DC

## 2018-06-27 MED ORDER — METOPROLOL TARTRATE 25 MG PO TABS
25.0000 mg | ORAL_TABLET | Freq: Two times a day (BID) | ORAL | Status: DC
  Administered 2018-06-27 – 2018-06-29 (×5): 25 mg via ORAL
  Filled 2018-06-27 (×5): qty 1

## 2018-06-27 MED ORDER — POTASSIUM CHLORIDE CRYS ER 20 MEQ PO TBCR
20.0000 meq | EXTENDED_RELEASE_TABLET | Freq: Two times a day (BID) | ORAL | Status: DC
  Administered 2018-06-28 – 2018-06-29 (×3): 20 meq via ORAL
  Filled 2018-06-27 (×3): qty 1

## 2018-06-27 MED ORDER — INSULIN ASPART 100 UNIT/ML ~~LOC~~ SOLN
0.0000 [IU] | Freq: Three times a day (TID) | SUBCUTANEOUS | Status: DC
  Administered 2018-06-27: 3 [IU] via SUBCUTANEOUS
  Administered 2018-06-27: 4 [IU] via SUBCUTANEOUS
  Administered 2018-06-27: 7 [IU] via SUBCUTANEOUS
  Administered 2018-06-28: 4 [IU] via SUBCUTANEOUS
  Administered 2018-06-28: 3 [IU] via SUBCUTANEOUS
  Administered 2018-06-29: 4 [IU] via SUBCUTANEOUS

## 2018-06-27 MED ORDER — INSULIN ASPART 100 UNIT/ML ~~LOC~~ SOLN: 0.0000 [IU] | Freq: Every day | SUBCUTANEOUS | Status: DC

## 2018-06-27 MED ORDER — HYDROCHLOROTHIAZIDE 12.5 MG PO CAPS
12.5000 mg | ORAL_CAPSULE | Freq: Every day | ORAL | Status: DC
  Administered 2018-06-27 – 2018-06-29 (×3): 12.5 mg via ORAL
  Filled 2018-06-27 (×3): qty 1

## 2018-06-27 MED ORDER — POTASSIUM CHLORIDE 10 MEQ/50ML IV SOLN
10.0000 meq | INTRAVENOUS | Status: AC
  Administered 2018-06-27 (×3): 10 meq via INTRAVENOUS
  Filled 2018-06-27 (×3): qty 50

## 2018-06-27 MED ORDER — INSULIN ASPART 100 UNIT/ML ~~LOC~~ SOLN
4.0000 [IU] | Freq: Three times a day (TID) | SUBCUTANEOUS | Status: DC
  Administered 2018-06-29: 4 [IU] via SUBCUTANEOUS

## 2018-06-27 MED ORDER — POTASSIUM CHLORIDE 10 MEQ/50ML IV SOLN
10.0000 meq | INTRAVENOUS | Status: AC
  Administered 2018-06-27 (×3): 10 meq via INTRAVENOUS
  Filled 2018-06-27: qty 50

## 2018-06-27 MED ORDER — LISINOPRIL 10 MG PO TABS
10.0000 mg | ORAL_TABLET | Freq: Every day | ORAL | Status: DC
  Administered 2018-06-27 – 2018-06-29 (×3): 10 mg via ORAL
  Filled 2018-06-27 (×3): qty 1

## 2018-06-27 MED ORDER — MOVING RIGHT ALONG BOOK
Freq: Once | Status: DC
  Filled 2018-06-27: qty 1

## 2018-06-27 MED ORDER — METFORMIN HCL ER 500 MG PO TB24
1000.0000 mg | ORAL_TABLET | Freq: Every day | ORAL | Status: DC
  Administered 2018-06-27: 1000 mg via ORAL
  Filled 2018-06-27 (×2): qty 2

## 2018-06-27 MED ORDER — FUROSEMIDE 40 MG PO TABS
40.0000 mg | ORAL_TABLET | Freq: Every day | ORAL | Status: AC
  Administered 2018-06-27 – 2018-06-29 (×3): 40 mg via ORAL
  Filled 2018-06-27 (×3): qty 1

## 2018-06-27 MED FILL — Lidocaine HCl(Cardiac) IV PF Soln Pref Syr 100 MG/5ML (2%): INTRAVENOUS | Qty: 5 | Status: AC

## 2018-06-27 MED FILL — Sodium Chloride IV Soln 0.9%: INTRAVENOUS | Qty: 3000 | Status: AC

## 2018-06-27 MED FILL — Sodium Bicarbonate IV Soln 8.4%: INTRAVENOUS | Qty: 50 | Status: AC

## 2018-06-27 MED FILL — Electrolyte-R (PH 7.4) Solution: INTRAVENOUS | Qty: 4000 | Status: AC

## 2018-06-27 MED FILL — Mannitol IV Soln 20%: INTRAVENOUS | Qty: 500 | Status: AC

## 2018-06-27 MED FILL — Heparin Sodium (Porcine) Inj 1000 Unit/ML: INTRAMUSCULAR | Qty: 20 | Status: AC

## 2018-06-27 NOTE — Progress Notes (Signed)
Inpatient Diabetes Program Recommendations  AACE/ADA: New Consensus Statement on Inpatient Glycemic Control (2015)  Target Ranges:  Prepandial:   less than 140 mg/dL      Peak postprandial:   less than 180 mg/dL (1-2 hours)      Critically ill patients:  140 - 180 mg/dL   Lab Results  Component Value Date   GLUCAP 220 (H) 06/27/2018   HGBA1C 8.2 (H) 06/23/2018    Review of Glycemic ControlResults for Joshua Branch, Joshua Branch (MRN [id number]) as of 06/27/2018 12:32  Ref. Range 06/26/2018 19:40 06/26/2018 23:09 06/27/2018 03:25 06/27/2018 08:10 06/27/2018 11:35  Glucose-Capillary Latest Ref Range: 70 - 99 mg/dL 245 (H) 219 (H) 111 (H) 131 (H) 220 (H)    Diabetes history: Type 2 DM  Outpatient Diabetes medications:  Metformin 1000 mg with breakfast Current orders for Inpatient glycemic control:  Levemir 24 units bid, Novolog resistant tid with meals and HS, Metformin XR- 1000 mg daily with breakfast  Inpatient Diabetes Program Recommendations:    Referral received.  I have already spoken with patient and wife prior to surgery.  Visited with them again.  Patient plans to enroll in [employer] program (wife is employee at [employer]).  Discussed A1C again.  Based on A1C, patient should not need insulin at d/c although he may need increase of Metformin and/or an additional oral agent for DM at d/c.  Patient reports reduced appetite.  Not feeling well today.  He will be going to Cardiac Rehab.  Recommended checking blood sugars at least daily (at different times of the day) and f/u with PCP. Note that referral has been made to endocrinologist.  Again discussed importance of glycemic control.   Thanks,  Adah Perl, RN, BC-ADM Inpatient Diabetes Coordinator Pager [phone] (8a-5p)

## 2018-06-27 NOTE — Telephone Encounter (Addendum)
I am typng this note after review pt office visit, telephone notes, my chart message, labs result notes as well as media review.  What prompted me to write this was that patient had recent ED evaluation for chest pain and was eventually admitted to hospital for surgery/CABG.   He has hx of htn, diabetes(uncontrolled/just recently started tx), and recent diagnosis of hyperlipidemia after fasting  labs were finally  done this summer.(despite attempt/request to get lipid panel over the years)   I began review of notes in depth after I saw that he told thoracic surgeon he was not away that he had hyperlipidemia?  I did review his chart and informed him by my chart of his P5-T, metabolic panel and lipid panel results in June via my chart. Plan explained and asked to follow up in one month. In epic it states viewed by patient an 01/20/2018.   On further review of chart back in October and November of 2015 I had asked  Alby twice  to come in scheduled for complete physical by January. I had planned to get fasting labs to assess his risk factors. Also in November I had asked and explained getting a1c but he declined getting a1c.  Also on review of epic media center just found within past 2 days that it is documented in 05-09-2010 he had hospital  Encounter. Source of info appears to have been [facility]. So appears he may have been untreated for diabetes for up to 8 years?  In December 2015 S[facility] came in for acute visit but had not scheduled his CPE. No lipid panel was done as he needed to be fasting. I asked him to follow up in 2 weeks. Per review of chart it appears he follow one year and 7 months later.   On subsequent follow up July 2016, I again tried to get fasting labs(attempt #3 to get lipid panel). This time placed future labs cmp, lipid panel and cbc and asked to get those done. They were never done.   Also [id number] on review a1c reminder popped up that appears a1c  ordered but not done. So asked MA to call patient and remind him to get test done. It appears never came in to get that done.    On 12-20-2015. I again asked pt to get  labs/lipid panel but he was not fasting. This was 4th attempt to get lipid panel.  Next visit occurred approximately one year later on 12-07-2016. During that year Caryl Pina had sent pt message asking him to come in for blood pressure check and check on potassium level. (4 month after request he came in.) From that visit labs came back . A1c was elevated. I sent in metformin and refills. I am not sure/don't think he started med as I don't think he called for refills. Up to this point 2 years prior he declined using any medication for diabetes. On my chart review e script confirmed sent to his pharmacy. I never got call back confirming he agreed to take though my chart message was reviewed and I did want to know if he agreed to start? I had asked pt to follow up in 3 months.   He later called to se up physical for 01-17-2018. This is when finally got lipid results. He reviewed my chart message and I asked him to come in for follow up in a month. I at that point was doing to review his sugar levels and likely be more  aggressive with diabetic treatment. Also discuss lipids in more detail and explain treatment option. He did not follow up. Although medication metformin was started.  In September got note from podiatrist that he was treating ulcers on pt foot/heal. On epic review 04-22-2018 I asked staff to call patient and get him in to discuss diabetic management as I thought follow up would be important to help his feet heel. Staff and told he would call back. Appears he never called back.  I got additional reports from podiatrist decided on 06-12-2018 to give patient option to come in or to be referred to endocrinologist. He agreed to be referred to endocrinologist. I did place the referral.   Then on 06/22/2018 got ED note to review.   I  typed this in order to review with patient, possibly his wife and my supervising physician if needed.   Note as of this date/early past week summarized case with  Dr. Charlett Blake.   Also went ahead an typed up as extensive review in epic is not user friendly and review of above would be quicker if needed.

## 2018-06-27 NOTE — Progress Notes (Signed)
Patient walked this shift for about 370 ft. Tolerated it well.

## 2018-06-27 NOTE — Discharge Summary (Signed)
Physician Discharge Summary  Patient ID: Joshua Branch MRN: [id number] DOB/AGE: [birth year] 57 y.o.  Admit date: 06/22/2018 Discharge date: 06/29/2018  Admission Diagnoses: Severe coronary artery disease status post acute STEMI  Discharge Diagnoses:  Principal Problem:   S/P CABG x 4 Active Problems:   HTN (hypertension)   Obstructive sleep apnea   Hyperglycemia   Acute coronary syndrome ([name]   Uncontrolled type 2 diabetes mellitus ([place])   Nonhealing skin ulcer ([name]   Coronary artery disease involving native coronary artery with unstable angina pectoris ([place])   Acute myocardial infarction ([place])   Hyperlipidemia   Type II diabetes mellitus with complication, uncontrolled ([place])  Patient Active Problem List   Diagnosis Date Noted  . 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 Acute coronary syndrome ([place]) 06/22/2018  . Uncontrolled type 2 diabetes mellitus ([place]) 06/22/2018  . Nonhealing skin ulcer ([place]) 06/22/2018  . Coronary artery disease involving native coronary artery with unstable angina pectoris ([place]) 06/22/2018  . Acute myocardial infarction ([place]) 06/22/2018  . Hyperlipidemia   . Type II diabetes mellitus with complication, uncontrolled ([place])   . 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 Vertigo 08/20/2016  . Myringotomy tube status 08/20/2016  . Ear fullness, left 08/20/2016  . Asymmetrical left sensorineural hearing loss 02/06/2016  . Neuropathy of left foot 02/03/2015  . Charcot's joint of left foot, non-diabetic 02/03/2015  . 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 Hyperglycemia 07/21/2014  . Cramping of hands 06/02/2014  . Eustachian tube dysfunction 06/02/2014  . HTN (hypertension) 05/07/2014  . Obstructive sleep apnea 05/07/2014   History of present illness:  The patient is a 57 year old male with no previous history of coronary artery disease but multiple risk factors notable for hypertension, poorly controlled type 2 diabetes, hyperlipidemia as well as a  family history of coronary artery disease who was admitted with a delayed presentation of acute anterior apical myocardial infarction.  The patient states that several months ago he began to develop episodes of postprandial sharp substernal chest pain.  He underwent a GI evaluation including endoscopy and colonoscopy and was told that he probably had reflux.  He was placed on omeprazole.  His symptoms were improved until Friday morning prior to admission when he developed a sudden onset of severe sharp substernal chest pain.  This pain was felt to be somewhat different than previous episodes and considerably more severe.  The symptoms continued to wax and wane for approximately 48 hours.  The patient was evaluated at [facility] where an EKG revealed sinus tachycardia with subtle ST segment elevation across the anterior and lateral leads.:  I was markedly elevated at 12 ng/mL.Marland Kitchen He was transferred to [facility] where he was seen in cardiology consultation by Dr. Virgina Jock and was admitted for further evaluation and treatment.  Discharged Condition: good  Hospital Course: The patient was admitted and underwent a cardiology evaluation to include cardiac catheterization which revealed severe three-vessel coronary artery disease with subtotal occlusion of the distal left anterior descending coronary artery.  There was mild left ventricular systolic dysfunction.  Thoracic surgical consultation was obtained with Dr. Roxy Manns evaluated patient and his studies and agree with recommendations to proceed with CABG.  On 06/25/2018 he was taken to the operating room where he underwent the below described procedure.  He tolerated it well and was taken to the surgical intensive care unit in stable condition.  Postoperative hospital course:  Patient did quite well.  He was weaned from  the ventilator using standard postoperative protocols without difficulty.  He has remained hemodynamically stable and  normal sinus rhythm.  All routine lines, monitors and drainage devices have been discontinued in the standard fashion.  He did have some postoperative volume overload but did respond well to diuretics.  Blood sugars have been under adequate control he has been seen by the diabetes coordinator and has a plan for follow-up for poorly controlled diabetes including endocrinologist.  His metformin dose was increased from previous.  His renal function has remained within normal limits.  He does have a minor stable acute blood loss anemia.  Oxygen has been weaned and he maintains good saturations on room air.  He tolerated gradually increasing activities using standard cardiac rehab protocols.  Incisions were healing well without evidence of infection.  At the time of discharge the patient is felt to be quite stable.  Consults: cardiology  Significant Diagnostic Studies: angiography: Cardiac catheterization  Treatments: surgery:  CARDIOTHORACIC SURGERY OPERATIVE NOTE  Date of Procedure:    06/25/2018  Preoperative Diagnosis:        Severe 3-vessel Coronary Artery Disease  S/P Acute Myocardial Infarction  Postoperative Diagnosis:    Same  Procedure:        Coronary Artery Bypass Grafting x 4              Left Internal Mammary Artery to Distal Left Anterior Descending Coronary Artery             Saphenous Vein Graft to Posterior Descending Coronary Artery             Saphenous Vein Graft to Second Obtuse Marginal Branch of Left Circumflex Coronary Artery             Sapheonous Vein Graft to Diagonal Branch Coronary Artery             Endoscopic Vein Harvest from Right Thigh and Lower Leg  Surgeon:        Valentina Gu. Roxy Manns, MD  Assistant:       John Giovanni, PA-C  Anesthesia:    Roderic Palau, MD  Operative Findings: ? Mild left ventricular systolic dysfunction ? Good quality left internal mammary artery conduit ? Good quality saphenous vein conduit ? Diffuse CAD with poor  quality target vessels for grafting ? Distal portion of anterior wall appeared viable and distal LAD was adequate target vessel for grafting  Discharge Exam: Blood pressure (!) 153/92, pulse 96, temperature 98.2 F (36.8 C), temperature source Oral, resp. rate (!) 27, height 6' 2.5" (1.892 m), weight 110.1 kg, SpO2 98 %.  General appearance: alert, cooperative and no distress Heart: regular rate and rhythm Lungs: clear to auscultation bilaterally Abdomen: benign Extremities: trace edema Wound: incis healing well   Disposition: Discharge disposition: 01-Home or Self Care       Discharge Instructions    Amb Referral to Cardiac Rehabilitation   Complete by:  As directed    Diagnosis:   CABG NSTEMI     CABG X ___:  4   Discharge patient   Complete by:  As directed    Discharge disposition:  01-Home or Self Care   Discharge patient date:  06/29/2018     Allergies as of 06/29/2018      Reactions   Apricot Kernel Oil [prunus] Swelling   THROAT   Cherry Swelling   THROAT   Other Other (See Comments)   Fruits with a pit. Throat swells up. Can have them if cooked  Peach [prunus Persica] Swelling   THROAT   Plum Pulp Swelling   THROAT      Medication List    STOP taking these medications   cadexomer iodine 0.9 % gel Commonly known as:  IODOSORB   cetirizine 10 MG tablet Commonly known as:  ZYRTEC   sildenafil 20 MG tablet Commonly known as:  REVATIO     TAKE these medications   aspirin EC 81 MG tablet Take 81 mg by mouth daily.   atorvastatin 80 MG tablet Commonly known as:  LIPITOR Take 1 tablet (80 mg total) by mouth daily at 6 PM.   clopidogrel 75 MG tablet Commonly known as:  PLAVIX Take 1 tablet (75 mg total) by mouth daily.   lisinopril-hydrochlorothiazide 10-12.5 MG tablet Commonly known as:  PRINZIDE,ZESTORETIC TAKE 1 TABLET BY MOUTH DAILY.   metformin 1000 MG (OSM) 24 hr tablet Commonly known as:  FORTAMET Take 1 tablet (1,000 mg total)  by mouth 2 (two) times daily with a meal. What changed:    medication strength  See the new instructions.   metoprolol tartrate 25 MG tablet Commonly known as:  LOPRESSOR Take 1 tablet (25 mg total) by mouth 2 (two) times daily.   oxyCODONE 5 MG immediate release tablet Commonly known as:  Oxy IR/ROXICODONE Take 1-2 tablets (5-10 mg total) by mouth every 6 (six) hours as needed for up to 7 days for severe pain.      Follow-up Information    Patwardhan, Reynold Bowen, MD Follow up.   Specialty:  Cardiology Why:  Please call cardiology office and arrange for 2-week appointment.  Contact information: [address] [place] [address] [phone]          The patient has been discharged on:   1.Beta Blocker:  Yes Blue.Reese   ]                              No   [   ]                              If No, reason:  2.Ace Inhibitor/ARB: Yes [ y  ]                                     No  [    ]                                     If No, reason:  3.Statin:   Yes Blue.Reese   ]                  No  [   ]                  If No, reason:  4.Ecasa:  Yes  [  y ]                  No   [   ]                  If No, reason:  Signed: John Giovanni 06/29/2018, 9:13 AM

## 2018-06-27 NOTE — Consult Note (Signed)
[place] Nurse wound consult note Reconsulted for DFU.  I'm not sure what happened with my order for care I placed on 06/24/18, but I have re-entered the wound care order. Reason for Consult: DFU. Monitor the wound area(s) for worsening of condition such as: Signs/symptoms of infection,  Increase in size,  Development of or worsening of odor, Development of pain, or increased pain at the affected locations.  Notify the medical team if any of these develop.  Thank you for the consult.  Discussed plan of care with the patient and bedside nurse.  [place] nurse will not follow at this time.  Please re-consult the [place] team if needed.  Val Riles, RN, MSN, CWOCN, CNS-BC, pager [phone]

## 2018-06-27 NOTE — Plan of Care (Signed)

## 2018-06-27 NOTE — Discharge Instructions (Signed)
Endoscopic Saphenous Vein Harvesting, Care After °Refer to this sheet in the next few weeks. These instructions provide you with information about caring for yourself after your procedure. Your health care provider may also give you more specific instructions. Your treatment has been planned according to current medical practices, but problems sometimes occur. Call your health care provider if you have any problems or questions after your procedure. °What can I expect after the procedure? °After the procedure, it is common to have: °· Pain. °· Bruising. °· Swelling. °· Numbness. ° °Follow these instructions at home: °Medicine °· Take over-the-counter and prescription medicines only as told by your health care provider. °· Do not drive or operate heavy machinery while taking prescription pain medicine. °Incision care ° °· Follow instructions from your health care provider about how to take care of the cut made during surgery (incision). Make sure you: °? Wash your hands with soap and water before you change your bandage (dressing). If soap and water are not available, use hand sanitizer. °? Change your dressing as told by your health care provider. °? Leave stitches (sutures), skin glue, or adhesive strips in place. These skin closures may need to be in place for 2 weeks or longer. If adhesive strip edges start to loosen and curl up, you may trim the loose edges. Do not remove adhesive strips completely unless your health care provider tells you to do that. °· Check your incision area every day for signs of infection. Check for: °? More redness, swelling, or pain. °? More fluid or blood. °? Warmth. °? Pus or a bad smell. °General instructions °· Raise (elevate) your legs above the level of your heart while you are sitting or lying down. °· Do any exercises your health care providers have given you. These may include deep breathing, coughing, and walking exercises. °· Do not shower, take baths, swim, or use a hot tub  unless told by your health care provider. °· Wear your elastic stocking if told by your health care provider. °· Keep all follow-up visits as told by your health care provider. This is important. °Contact a health care provider if: °· Medicine does not help your pain. °· Your pain gets worse. °· You have new leg bruises or your leg bruises get bigger. °· You have a fever. °· Your leg feels numb. °· You have more redness, swelling, or pain around your incision. °· You have more fluid or blood coming from your incision. °· Your incision feels warm to the touch. °· You have pus or a bad smell coming from your incision. °Get help right away if: °· Your pain is severe. °· You develop pain, tenderness, warmth, redness, or swelling in any part of your leg. °· You have chest pain. °· You have trouble breathing. °This information is not intended to replace advice given to you by your health care provider. Make sure you discuss any questions you have with your health care provider. °Document Released: 03/28/2011 Document Revised: 12/22/2015 Document Reviewed: 05/30/2015 °Elsevier Interactive Patient Education © 2018 [employer] °Coronary Artery Bypass Grafting, Care After °These instructions give you information on caring for yourself after your procedure. Your doctor may also give you more specific instructions. Call your doctor if you have any problems or questions after your procedure. °Follow these instructions at home: °· Only take medicine as told by your doctor. Take medicines exactly as told. Do not stop taking medicines or start any new medicines without talking to your doctor first. °·   Take your pulse as told by your doctor. °· Do deep breathing as told by your doctor. Use your breathing device (incentive spirometer), if given, to practice deep breathing several times a day. Support your chest with a pillow or your arms when you take deep breaths or cough. °· Keep the area clean, dry, and protected where the  surgery cuts (incisions) were made. Remove bandages (dressings) only as told by your doctor. If strips were applied to surgical area, do not take them off. They fall off on their own. °· Check the surgery area daily for puffiness (swelling), redness, or leaking fluid. °· If surgery cuts were made in your legs: °? Avoid crossing your legs. °? Avoid sitting for long periods of time. Change positions every 30 minutes. °? Raise your legs when you are sitting. Place them on pillows. °· Wear stockings that help keep blood clots from forming in your legs (compression stockings). °· Only take sponge baths until your doctor says it is okay to take showers. Pat the surgery area dry. Do not rub the surgery area with a washcloth or towel. Do not bathe, swim, or use a hot tub until your doctor says it is okay. °· Eat foods that are high in fiber. These include raw fruits and vegetables, whole grains, beans, and nuts. Choose lean meats. Avoid canned, processed, and fried foods. °· Drink enough fluids to keep your pee (urine) clear or pale yellow. °· Weigh yourself every day. °· Rest and limit activity as told by your doctor. You may be told to: °? Stop any activity if you have chest pain, shortness of breath, changes in heartbeat, or dizziness. Get help right away if this happens. °? Move around often for short amounts of time or take short walks as told by your doctor. Gradually become more active. You may need help to strengthen your muscles and build endurance. °? Avoid lifting, pushing, or pulling anything heavier than 10 pounds (4.5 kg) for at least 6 weeks after surgery. °· Do not drive until your doctor says it is okay. °· Ask your doctor when you can go back to work. °· Ask your doctor when you can begin sexual activity again. °· Follow up with your doctor as told. °Contact a doctor if: °· You have puffiness, redness, more pain, or fluid draining from the incision site. °· You have a fever. °· You have puffiness in your  ankles or legs. °· You have pain in your legs. °· You gain 2 or more pounds (0.9 kg) a day. °· You feel sick to your stomach (nauseous) or throw up (vomit). °· You have watery poop (diarrhea). °Get help right away if: °· You have chest pain that goes to your jaw or arms. °· You have shortness of breath. °· You have a fast or irregular heartbeat. °· You notice a "clicking" in your breastbone when you move. °· You have numbness or weakness in your arms or legs. °· You feel dizzy or light-headed. °This information is not intended to replace advice given to you by your health care provider. Make sure you discuss any questions you have with your health care provider. °Document Released: 07/21/2013 Document Revised: 12/22/2015 Document Reviewed: 12/23/2012 °Elsevier Interactive Patient Education © 2017 [employer] Inc. ° °

## 2018-06-27 NOTE — Progress Notes (Signed)
Pt received from [place]. VSS. Telemetry applied. Pt oriented to room and unit. Lunch tray ordered. Will continue to monitor.  [name] Canterbury, RN

## 2018-06-27 NOTE — Progress Notes (Signed)
Stopped by pt's room to walk/educate. PT was beginning with their pt visit. Cardiac Rehab will continue to monitor and follow.   Carma Lair MS, ACSM CEP 11:03 AM 06/27/2018

## 2018-06-27 NOTE — Progress Notes (Addendum)
[address]       [place] [address]             [phone]      2 Days Post-Op Procedure(s) (LRB): CORONARY ARTERY BYPASS GRAFTING (CABG) TIMES FOUR USING LEFT INTERNAL MAMMARY ARTERY AND RIGHT ENDOSCOPICALLY HARVESTED SAPHENOUS VEIN (N/A) TRANSESOPHAGEAL ECHOCARDIOGRAM (TEE) (N/A) ENDOVEIN HARVEST OF GREATER SAPHENOUS VEIN (Right) Subjective: conts to feel better  Objective: Vital signs in last 24 hours: Temp:  [98.3 F (36.8 C)-99.1 F (37.3 C)] 98.5 F (36.9 C) (11/29 0400) Pulse Rate:  [79-88] 84 (11/29 0700) Cardiac Rhythm: Normal sinus rhythm (11/29 0300) Resp:  [11-39] 19 (11/29 0700) BP: (113-140)/(75-96) 127/76 (11/29 0700) SpO2:  [91 %-97 %] 93 % (11/29 0700) Arterial Line BP: (133-138)/(68-79) 133/68 (11/28 0900) Weight:  [884 kg] 113 kg (11/29 0500)  Hemodynamic parameters for last 24 hours: PAP: (12-21)/(4-10) 12/4  Intake/Output from previous day: 11/28 0701 - 11/29 0700 In: 1164.5 [P.O.:300; I.V.:764.5; IV Piggyback:100] Out: 1660 [Urine:3410; Chest Tube:70] Intake/Output this shift: No intake/output data recorded.  General appearance: alert, cooperative and no distress Heart: regular rate and rhythm and no rub Lungs: mildly dim in bases Abdomen: + BS, soft, nontender Extremities: min edema Wound: dressings CDI  Lab Results: Recent Labs    06/26/18 1653 06/26/18 1701 06/27/18 0350  WBC 13.7*  --  12.5*  HGB 11.9* 12.2* 11.3*  HCT 34.8* 36.0* 34.9*  PLT 201  --  242   BMET:  Recent Labs    06/26/18 0313  06/26/18 1701 06/27/18 0350  NA 131*  --  131* 131*  K 4.3  --  4.4 3.4*  CL 104  --  94* 98  CO2 21*  --   --  28  GLUCOSE 108*  --  223* 111*  BUN 17  --  17 16  CREATININE 0.93   < > 0.70 0.81  CALCIUM 7.2*  --   --  7.5*   < > = values in this interval not displayed.    PT/INR:  Recent Labs    06/25/18 1330  LABPROT 16.9*  INR 1.39   ABG    Component Value Date/Time   PHART 7.351 06/25/2018  1800   HCO3 22.5 06/25/2018 1800   TCO2 26 06/26/2018 1701   ACIDBASEDEF 3.0 (H) 06/25/2018 1800   O2SAT 98.0 06/25/2018 1800   CBG (last 3)  Recent Labs    06/26/18 1940 06/26/18 2309 06/27/18 0325  GLUCAP 245* 219* 111*    Meds Scheduled Meds: . acetaminophen  1,000 mg Oral Q6H  . aspirin EC  81 mg Oral Daily  . atorvastatin  80 mg Oral q1800  . bisacodyl  10 mg Oral Daily  . Chlorhexidine Gluconate Cloth  6 each Topical Daily  . clopidogrel  75 mg Oral Daily  . docusate sodium  200 mg Oral Daily  . enoxaparin (LOVENOX) injection  40 mg Subcutaneous QHS  . furosemide  40 mg Oral Daily  . lisinopril  10 mg Oral Daily   And  . hydrochlorothiazide  12.5 mg Oral Daily  . insulin aspart  0-20 Units Subcutaneous TID WC  . insulin aspart  0-5 Units Subcutaneous QHS  . insulin aspart  4 Units Subcutaneous TID WC  . insulin detemir  24 Units Subcutaneous BID  . metoprolol tartrate  25 mg Oral BID  . moving right along book   Does not apply Once  . pantoprazole  40 mg Oral  Daily  . [START ON 06/28/2018] potassium chloride  20 mEq Oral BID  . sodium chloride flush  10-40 mL Intracatheter Q12H  . sodium chloride flush  3 mL Intravenous Q12H   Continuous Infusions: . sodium chloride    . lactated ringers Stopped (06/27/18 0542)  . magnesium sulfate Stopped (06/25/18 1831)  . potassium chloride 10 mEq (06/27/18 0700)  . potassium chloride     PRN Meds:.metoprolol tartrate, morphine injection, ondansetron (ZOFRAN) IV, oxyCODONE, sodium chloride flush, sodium chloride flush, traMADol  Xrays Dg Chest Port 1 View  Result Date: 06/27/2018 CLINICAL DATA:  Status post coronary artery bypass grafting. Atelectasis. EXAM: PORTABLE CHEST 1 VIEW COMPARISON:  June 26, 2018 FINDINGS: Swan-Ganz catheter has been removed. Cordis tip is in the superior vena cava. Epicardial pacemaker leads are noted. Chest tube on the left and mediastinal drain have been removed. There is a left pleural  effusion with atelectatic change in each lung base. There is no frank consolidation. Heart is mildly enlarged with pulmonary vascularity normal, stable. No adenopathy evident. No bone lesions. IMPRESSION: Cordis tip in superior vena cava. No pneumothorax. Small left pleural effusion. Bibasilar atelectasis. No consolidation. Stable cardiac prominence. Electronically Signed   By: Lowella Grip III M.D.   On: 06/27/2018 07:08   Dg Chest Port 1 View  Result Date: 06/26/2018 CLINICAL DATA:  Shortness of breath EXAM: PORTABLE CHEST 1 VIEW COMPARISON:  06/25/2018 FINDINGS: Postsurgical changes are again noted. Swan-Ganz catheter is coiled upon itself within the right ventricle. Left thoracostomy catheter and mediastinal drain are again seen. The endotracheal tube and nasogastric catheter have been removed in the interval. Pericardial drain remains. Mild left basilar atelectasis is again noted with minimal blunting of the costophrenic angle. No other focal abnormality is seen. IMPRESSION: Tubes and lines as described above. Stable left basilar atelectasis. Electronically Signed   By: Inez Catalina M.D.   On: 06/26/2018 09:32   Dg Chest Port 1 View  Result Date: 06/25/2018 CLINICAL DATA:  Patient status post CABG today. EXAM: PORTABLE CHEST 1 VIEW COMPARISON:  Single-view of the chest 06/24/2018. FINDINGS: Endotracheal tube is in place with the tip at the level of the clavicular heads. NG tube courses into the stomach and below the inferior margin of the film. Right IJ approach Swan-Ganz catheter tip is in the right ventricle. Mediastinal drains and left chest tube are noted. There is left basilar atelectasis. Right lung is clear. No pneumothorax. No pleural effusion. Heart size is normal. IMPRESSION: Support tubes and lines as described. Note is made that the tip of the patient's Swan-Ganz catheter is in the right ventricle. Negative for pneumothorax. Left basilar atelectasis. Electronically Signed   By: Inge Rise M.D.   On: 06/25/2018 13:51    Assessment/Plan: S/P Procedure(s) (LRB): CORONARY ARTERY BYPASS GRAFTING (CABG) TIMES FOUR USING LEFT INTERNAL MAMMARY ARTERY AND RIGHT ENDOSCOPICALLY HARVESTED SAPHENOUS VEIN (N/A) TRANSESOPHAGEAL ECHOCARDIOGRAM (TEE) (N/A) ENDOVEIN HARVEST OF GREATER SAPHENOUS VEIN (Right)  1 conts t do well POD# 2 CABG 2 hemodyn stable in sinus rhythm on no inotropic support 3 sats good on 1 liter [place], IS/Pulm toilet for atx.  4 hypokalemia- replace 5 good UOP, WT 4 kg > preop- cont lasix at 40 mg daily for now. BUN/Cr, GFR in normal range 6 leukocytosis trend improving, no fevers 7 expected acute blood loss anemia is stable 8 poorly controlled DM, HgA1C 8.2- needs significant lifestyle an nutrition modification for severe metabolic syndrome. Will restart metformin, may benefit from SGLT2-inhibitor .  Cont insulin for now- will ask diabetic coordinator to see 9 cardiac rehab 10 DAPT/statin /betablocker/ACE-I 11 tx to [facility]   LOS: 5 days    John Giovanni [facility] 06/27/2018 Pager [phone]   I have seen and examined the patient and agree with the assessment and plan as outlined.  Doing very well POD2  Rexene Alberts, MD 06/27/2018 8:22 AM

## 2018-06-28 ENCOUNTER — Inpatient Hospital Stay (HOSPITAL_COMMUNITY): Payer: 59

## 2018-06-28 LAB — CBC
HCT: 34.4 % — ABNORMAL LOW (ref 39.0–52.0)
Hemoglobin: 11.4 g/dL — ABNORMAL LOW (ref 13.0–17.0)
MCH: 29.6 pg (ref 26.0–34.0)
MCHC: 33.1 g/dL (ref 30.0–36.0)
MCV: 89.4 fL (ref 80.0–100.0)
NRBC: 0 % (ref 0.0–0.2)
Platelets: 279 10*3/uL (ref 150–400)
RBC: 3.85 MIL/uL — ABNORMAL LOW (ref 4.22–5.81)
RDW: 11.5 % (ref 11.5–15.5)
WBC: 12.9 10*3/uL — AB (ref 4.0–10.5)

## 2018-06-28 LAB — BASIC METABOLIC PANEL
ANION GAP: 10 (ref 5–15)
BUN: 18 mg/dL (ref 6–20)
CO2: 26 mmol/L (ref 22–32)
Calcium: 7.7 mg/dL — ABNORMAL LOW (ref 8.9–10.3)
Chloride: 98 mmol/L (ref 98–111)
Creatinine, Ser: 0.91 mg/dL (ref 0.61–1.24)
GFR calc Af Amer: >60 mL/min (ref >60)
GFR calc non Af Amer: >60 mL/min (ref >60)
Glucose, Bld: 113 mg/dL — ABNORMAL HIGH (ref 70–99)
Potassium: 3.6 mmol/L (ref 3.5–5.1)
Sodium: 134 mmol/L — ABNORMAL LOW (ref 135–145)

## 2018-06-28 LAB — GLUCOSE, CAPILLARY
GLUCOSE-CAPILLARY: 117 mg/dL — AB (ref 70–99)
GLUCOSE-CAPILLARY: 133 mg/dL — AB (ref 70–99)
Glucose-Capillary: 183 mg/dL — ABNORMAL HIGH (ref 70–99)
Glucose-Capillary: 172 mg/dL — ABNORMAL HIGH (ref 70–99)

## 2018-06-28 MED ORDER — METFORMIN HCL ER 500 MG PO TB24
1000.0000 mg | ORAL_TABLET | Freq: Two times a day (BID) | ORAL | Status: DC
  Administered 2018-06-28 – 2018-06-29 (×3): 1000 mg via ORAL
  Filled 2018-06-28 (×3): qty 2

## 2018-06-28 NOTE — Progress Notes (Signed)
EPW removed per protocol. Tips intact. VSS. Pt educated on 1 hour bedrest. Call light in reach. Will continue to monitor.  [name] Canterbury, RN

## 2018-06-28 NOTE — Progress Notes (Signed)
Ed completed with pt and wife, [name]. Good reception. Will refer to [employer]  [place], ACSM 1:13 PM 06/28/2018

## 2018-06-28 NOTE — Progress Notes (Signed)
CARDIAC REHAB PHASE I   PRE:  Rate/Rhythm: 81 SR    BP: sitting 114/74    SaO2: 98 RA  MODE:  Ambulation: 750 ft   POST:  Rate/Rhythm: 90 SR    BP: sitting 122/83     SaO2: 97 RA  Moving well although he did need reminder not to push with arms. Steady in hall with slow pace. Able to walk and talk. VSS. To bed for EPW. Will ed with wife after lunch. [phone]   [place], ACSM 06/28/2018 11:32 AM

## 2018-06-28 NOTE — Progress Notes (Addendum)
[address]       [place] [address]             [phone]      3 Days Post-Op Procedure(s) (LRB): CORONARY ARTERY BYPASS GRAFTING (CABG) TIMES FOUR USING LEFT INTERNAL MAMMARY ARTERY AND RIGHT ENDOSCOPICALLY HARVESTED SAPHENOUS VEIN (N/A) TRANSESOPHAGEAL ECHOCARDIOGRAM (TEE) (N/A) ENDOVEIN HARVEST OF GREATER SAPHENOUS VEIN (Right) Subjective: conts to feel well, no new issues  Objective: Vital signs in last 24 hours: Temp:  [98 F (36.7 C)-99.6 F (37.6 C)] 98.6 F (37 C) (11/30 0812) Pulse Rate:  [82-93] 82 (11/30 0812) Cardiac Rhythm: Normal sinus rhythm;Bundle branch block (11/29 2015) Resp:  [16-29] 19 (11/30 0812) BP: (110-133)/(70-92) 129/92 (11/30 0812) SpO2:  [92 %-97 %] 97 % (11/30 0812) Weight:  [509 kg] 112 kg (11/30 0420)  Hemodynamic parameters for last 24 hours:    Intake/Output from previous day: 11/29 0701 - 11/30 0700 In: 1219.8 [P.O.:960; I.V.:59.4; IV Piggyback:200.4] Out: 570 [Urine:570] Intake/Output this shift: No intake/output data recorded.  General appearance: alert, cooperative and no distress Heart: regular rate and rhythm Lungs: clear to auscultation bilaterally Abdomen: benign Extremities: min edema Wound: incis healing well  Lab Results: Recent Labs    06/27/18 0350 06/28/18 0257  WBC 12.5* 12.9*  HGB 11.3* 11.4*  HCT 34.9* 34.4*  PLT 242 279   BMET:  Recent Labs    06/27/18 0350 06/28/18 0257  NA 131* 134*  K 3.4* 3.6  CL 98 98  CO2 28 26  GLUCOSE 111* 113*  BUN 16 18  CREATININE 0.81 0.91  CALCIUM 7.5* 7.7*    PT/INR:  Recent Labs    06/25/18 1330  LABPROT 16.9*  INR 1.39   ABG    Component Value Date/Time   PHART 7.351 06/25/2018 1800   HCO3 22.5 06/25/2018 1800   TCO2 26 06/26/2018 1701   ACIDBASEDEF 3.0 (H) 06/25/2018 1800   O2SAT 98.0 06/25/2018 1800   CBG (last 3)  Recent Labs    06/27/18 1628 06/27/18 2127 06/28/18 0620  GLUCAP 168* 152* 117*     Meds Scheduled Meds: . acetaminophen  1,000 mg Oral Q6H  . aspirin EC  81 mg Oral Daily  . atorvastatin  80 mg Oral q1800  . bisacodyl  10 mg Oral Daily  . Chlorhexidine Gluconate Cloth  6 each Topical Daily  . clopidogrel  75 mg Oral Daily  . docusate sodium  200 mg Oral Daily  . enoxaparin (LOVENOX) injection  40 mg Subcutaneous QHS  . furosemide  40 mg Oral Daily  . lisinopril  10 mg Oral Daily   And  . hydrochlorothiazide  12.5 mg Oral Daily  . insulin aspart  0-20 Units Subcutaneous TID WC  . insulin aspart  0-5 Units Subcutaneous QHS  . insulin aspart  4 Units Subcutaneous TID WC  . insulin detemir  24 Units Subcutaneous BID  . metFORMIN  1,000 mg Oral Q breakfast  . metoprolol tartrate  25 mg Oral BID  . moving right along book   Does not apply Once  . pantoprazole  40 mg Oral Daily  . potassium chloride  20 mEq Oral BID  . sodium chloride flush  10-40 mL Intracatheter Q12H  . sodium chloride flush  3 mL Intravenous Q12H   Continuous Infusions: . sodium chloride    . lactated ringers Stopped (06/27/18 1459)  . magnesium sulfate Stopped (06/25/18 1831)   PRN Meds:.metoprolol tartrate, morphine injection, ondansetron (ZOFRAN)  IV, oxyCODONE, sodium chloride flush, sodium chloride flush, traMADol  Xrays Dg Chest 2 View  Result Date: 06/28/2018 CLINICAL DATA:  Atelectasis. EXAM: CHEST - 2 VIEW COMPARISON:  06/27/2018 FINDINGS: Stable postsurgical changes from CABG. Cardiomediastinal silhouette is normal. Mediastinal contours appear intact. There is no evidence of pneumothorax. Left lower lobe atelectasis versus airspace consolidation. Bilateral small pleural effusions. Osseous structures are without acute abnormality. Soft tissues are grossly normal. IMPRESSION: Bilateral small pleural effusions. Left lower lobe atelectasis versus airspace consolidation. Electronically Signed   By: Fidela Salisbury M.D.   On: 06/28/2018 07:28   Dg Chest Port 1 View  Result Date:  06/27/2018 CLINICAL DATA:  Status post coronary artery bypass grafting. Atelectasis. EXAM: PORTABLE CHEST 1 VIEW COMPARISON:  June 26, 2018 FINDINGS: Swan-Ganz catheter has been removed. Cordis tip is in the superior vena cava. Epicardial pacemaker leads are noted. Chest tube on the left and mediastinal drain have been removed. There is a left pleural effusion with atelectatic change in each lung base. There is no frank consolidation. Heart is mildly enlarged with pulmonary vascularity normal, stable. No adenopathy evident. No bone lesions. IMPRESSION: Cordis tip in superior vena cava. No pneumothorax. Small left pleural effusion. Bibasilar atelectasis. No consolidation. Stable cardiac prominence. Electronically Signed   By: Lowella Grip III M.D.   On: 06/27/2018 07:08    Assessment/Plan: S/P Procedure(s) (LRB): CORONARY ARTERY BYPASS GRAFTING (CABG) TIMES FOUR USING LEFT INTERNAL MAMMARY ARTERY AND RIGHT ENDOSCOPICALLY HARVESTED SAPHENOUS VEIN (N/A) TRANSESOPHAGEAL ECHOCARDIOGRAM (TEE) (N/A) ENDOVEIN HARVEST OF GREATER SAPHENOUS VEIN (Right)  1 conts to do well , POD #3 2 hemodyn stable in sinus rhythm, BP control is good 3 sats good on RA 4 renal fxn norma 5 minor leukocytosis, stable 6 minor ABL anemia- stable 7 BS control is fairly good with one reading > 200, will d/c insulin except SSI and follow. Will increase metformin dose.  Has a good plan at discharge for f/u as outlined by DM coordinator 8 cont gentle diuresis for volume overload 9 push pulm toilet and rehab as able 10 poss d/c 1-2 days, d/c wires today   LOS: 6 days    John Giovanni PA-C 06/28/2018 Pager [phone]  Patient progressing well and appears to be ready for discharge home tomorrow Maintaining sinus rhythm Incisions clean and dry patient examined and medical record reviewed,agree with above note. Tharon Aquas Trigt III 06/28/2018

## 2018-06-29 ENCOUNTER — Telehealth: Payer: Self-pay | Admitting: Medical

## 2018-06-29 LAB — GLUCOSE, CAPILLARY: Glucose-Capillary: 161 mg/dL — ABNORMAL HIGH (ref 70–99)

## 2018-06-29 MED ORDER — METFORMIN HCL ER (OSM) 1000 MG PO TB24: 1000.0000 mg | ORAL_TABLET | Freq: Two times a day (BID) | ORAL | 1 refills | Status: DC

## 2018-06-29 MED ORDER — METOPROLOL TARTRATE 25 MG PO TABS: 25.0000 mg | ORAL_TABLET | Freq: Two times a day (BID) | ORAL | 1 refills | Status: DC

## 2018-06-29 MED ORDER — OXYCODONE HCL 5 MG PO TABS: 5.0000 mg | ORAL_TABLET | Freq: Four times a day (QID) | ORAL | 0 refills | Status: DC | PRN

## 2018-06-29 MED ORDER — ATORVASTATIN CALCIUM 80 MG PO TABS: 80.0000 mg | ORAL_TABLET | Freq: Every day | ORAL | 1 refills | Status: DC

## 2018-06-29 MED ORDER — CLOPIDOGREL BISULFATE 75 MG PO TABS: 75.0000 mg | ORAL_TABLET | Freq: Every day | ORAL | 1 refills | Status: DC

## 2018-06-29 NOTE — Progress Notes (Signed)
Pt ambulated 470 independently will continue to monitor

## 2018-06-29 NOTE — Telephone Encounter (Signed)
Pt needs hospital follow up. He had recent cabg after MI. I would like 40 minutes for him. When you call him for follow up can you get him scheduled for this Wednesday or Thursday 1pm-1:40pm.

## 2018-06-29 NOTE — Progress Notes (Signed)
Patient discharged.  Vital signs obtained and were stable prior to discharge, Peripheral IV removed CT sutures x3 removed per order Jadene Pierini, PA), with steristrips placed.  AVS printed and reviewed with pt, educated pt regarding medications, office visits, and after discharge care instructions with pt and wife.  Patient verbalized understanding.  All questions addressed.

## 2018-06-29 NOTE — Progress Notes (Signed)
[address]       [place] [address]             [phone]      4 Days Post-Op Procedure(s) (LRB): CORONARY ARTERY BYPASS GRAFTING (CABG) TIMES FOUR USING LEFT INTERNAL MAMMARY ARTERY AND RIGHT ENDOSCOPICALLY HARVESTED SAPHENOUS VEIN (N/A) TRANSESOPHAGEAL ECHOCARDIOGRAM (TEE) (N/A) ENDOVEIN HARVEST OF GREATER SAPHENOUS VEIN (Right) Subjective: Feels fine Not much appetite  Objective: Vital signs in last 24 hours: Temp:  [98 F (36.7 C)-99 F (37.2 C)] 98.9 F (37.2 C) (12/01 0501) Pulse Rate:  [81-95] 85 (12/01 0501) Cardiac Rhythm: Normal sinus rhythm (12/01 0701) Resp:  [19-28] 19 (12/01 0501) BP: (113-136)/(66-90) 136/86 (12/01 0501) SpO2:  [95 %-98 %] 98 % (12/01 0501) Weight:  [110.1 kg] 110.1 kg (12/01 0501)  Hemodynamic parameters for last 24 hours:    Intake/Output from previous day: 11/30 0701 - 12/01 0700 In: 720 [P.O.:720] Out: -  Intake/Output this shift: No intake/output data recorded.  General appearance: alert, cooperative and no distress Heart: regular rate and rhythm Lungs: clear to auscultation bilaterally Abdomen: benign Extremities: trace edema Wound: incis healing well  Lab Results: Recent Labs    06/27/18 0350 06/28/18 0257  WBC 12.5* 12.9*  HGB 11.3* 11.4*  HCT 34.9* 34.4*  PLT 242 279   BMET:  Recent Labs    06/27/18 0350 06/28/18 0257  NA 131* 134*  K 3.4* 3.6  CL 98 98  CO2 28 26  GLUCOSE 111* 113*  BUN 16 18  CREATININE 0.81 0.91  CALCIUM 7.5* 7.7*    PT/INR: No results for input(s): LABPROT, INR in the last 72 hours. ABG    Component Value Date/Time   PHART 7.351 06/25/2018 1800   HCO3 22.5 06/25/2018 1800   TCO2 26 06/26/2018 1701   ACIDBASEDEF 3.0 (H) 06/25/2018 1800   O2SAT 98.0 06/25/2018 1800   CBG (last 3)  Recent Labs    06/28/18 1628 06/28/18 2104 06/29/18 0556  GLUCAP 183* 172* 161*    Meds Scheduled Meds: . acetaminophen  1,000 mg Oral Q6H  . aspirin EC  81 mg  Oral Daily  . atorvastatin  80 mg Oral q1800  . bisacodyl  10 mg Oral Daily  . Chlorhexidine Gluconate Cloth  6 each Topical Daily  . clopidogrel  75 mg Oral Daily  . docusate sodium  200 mg Oral Daily  . enoxaparin (LOVENOX) injection  40 mg Subcutaneous QHS  . furosemide  40 mg Oral Daily  . lisinopril  10 mg Oral Daily   And  . hydrochlorothiazide  12.5 mg Oral Daily  . insulin aspart  0-20 Units Subcutaneous TID WC  . insulin aspart  0-5 Units Subcutaneous QHS  . insulin aspart  4 Units Subcutaneous TID WC  . metFORMIN  1,000 mg Oral BID WC  . metoprolol tartrate  25 mg Oral BID  . moving right along book   Does not apply Once  . pantoprazole  40 mg Oral Daily  . potassium chloride  20 mEq Oral BID  . sodium chloride flush  10-40 mL Intracatheter Q12H  . sodium chloride flush  3 mL Intravenous Q12H   Continuous Infusions: . sodium chloride    . lactated ringers Stopped (06/27/18 1459)  . magnesium sulfate Stopped (06/25/18 1831)   PRN Meds:.metoprolol tartrate, morphine injection, ondansetron (ZOFRAN) IV, oxyCODONE, sodium chloride flush, sodium chloride flush, traMADol  Xrays Dg Chest 2 View  Result Date: 06/28/2018 CLINICAL DATA:  Atelectasis. EXAM: CHEST - 2 VIEW COMPARISON:  06/27/2018 FINDINGS: Stable postsurgical changes from CABG. Cardiomediastinal silhouette is normal. Mediastinal contours appear intact. There is no evidence of pneumothorax. Left lower lobe atelectasis versus airspace consolidation. Bilateral small pleural effusions. Osseous structures are without acute abnormality. Soft tissues are grossly normal. IMPRESSION: Bilateral small pleural effusions. Left lower lobe atelectasis versus airspace consolidation. Electronically Signed   By: Fidela Salisbury M.D.   On: 06/28/2018 07:28    Assessment/Pla S/P Procedure(s) (LRB): CORONARY ARTERY BYPASS GRAFTING (CABG) TIMES FOUR USING LEFT INTERNAL MAMMARY ARTERY AND RIGHT ENDOSCOPICALLY HARVESTED SAPHENOUS VEIN  (N/A) TRANSESOPHAGEAL ECHOCARDIOGRAM (TEE) (N/A) ENDOVEIN HARVEST OF GREATER SAPHENOUS VEIN (Right) Plan for discharge: see discharge orders hemodyn stable in sinus rhythm, minor volume overload sats good on RA CBG 's adeq controlled on current RX No other new labs  LOS: 7 days    Joshua Branch [facility] 06/29/2018 Pager [phone]

## 2018-06-30 ENCOUNTER — Telehealth: Payer: Self-pay

## 2018-06-30 ENCOUNTER — Ambulatory Visit: Payer: Self-pay

## 2018-06-30 MED FILL — metFORMIN HCL ER 500 MG TB2: 500 | 30 days supply | Qty: 60 | Fill #0

## 2018-06-30 MED FILL — traMADol HCL 50 MG TABS: 50 | 10 days supply | Qty: 30 | Fill #0

## 2018-06-30 NOTE — Telephone Encounter (Signed)
Pt.'s wife reports pt.'s cardiologist called in Metformin wrong - called in 500 mg 1 po twice daily. What he needs is Metformin 500 mg 2 po twice a day. He has a follow up this week with Mackie Pai. Will discuss refills with him at that time.

## 2018-06-30 NOTE — Telephone Encounter (Signed)
06/30/18  Transition Care Management Follow-up Telephone Call  ADMISSION DATE: 06/22/18  DISCHARGE DATE: 06-29-18   How have you been since you were released from the hospital? Getting better daily per patient.   Do you understand why you were in the hospital? Yes.     Do you understand the discharge instrcutions? Yes    Items Reviewed:  Medications reviewed: Yes. Patient stated he is not taking Oxycodone.  Allergies reviewed: Yes   Dietary changes reviewed: Diabetic, Low fat   Referrals reviewed: Appointments scheduled.   Functional Questionnaire:  Activities of Daily Living (ADLs): Patientstates he is able to perform all independently but he does have help when needed.  Any patient concerns? None at this time.   Confirmed importance and date/time of follow-up visits scheduled: Yes   Confirmed with patient if condition begins to worsen call PCP or go to the ER. Yes    Patient was given the office number and encouragred to call back with questions or concerns. Yes    -

## 2018-07-01 ENCOUNTER — Encounter: Payer: Self-pay | Admitting: Sports Medicine

## 2018-07-01 ENCOUNTER — Other Ambulatory Visit: Payer: Self-pay | Admitting: *Deleted

## 2018-07-01 ENCOUNTER — Ambulatory Visit (INDEPENDENT_AMBULATORY_CARE_PROVIDER_SITE_OTHER): Payer: 59 | Admitting: Sports Medicine

## 2018-07-01 VITALS — BP 111/69 | HR 89 | Temp 96.3°F | Resp 16

## 2018-07-01 DIAGNOSIS — E1142 Type 2 diabetes mellitus with diabetic polyneuropathy: Secondary | ICD-10-CM

## 2018-07-01 DIAGNOSIS — E11610 Type 2 diabetes mellitus with diabetic neuropathic arthropathy: Secondary | ICD-10-CM

## 2018-07-01 DIAGNOSIS — L97921 Non-pressure chronic ulcer of unspecified part of left lower leg limited to breakdown of skin: Secondary | ICD-10-CM

## 2018-07-01 DIAGNOSIS — L97911 Non-pressure chronic ulcer of unspecified part of right lower leg limited to breakdown of skin: Secondary | ICD-10-CM

## 2018-07-01 DIAGNOSIS — L03032 Cellulitis of left toe: Secondary | ICD-10-CM

## 2018-07-01 DIAGNOSIS — L02612 Cutaneous abscess of left foot: Secondary | ICD-10-CM

## 2018-07-01 MED FILL — Dexmedetomidine HCl in NaCl 0.9% IV Soln 400 MCG/100ML: INTRAVENOUS | Qty: 100 | Status: AC

## 2018-07-01 MED FILL — Potassium Chloride Inj 2 mEq/ML: INTRAVENOUS | Qty: 40 | Status: AC

## 2018-07-01 MED FILL — Magnesium Sulfate Inj 50%: INTRAMUSCULAR | Qty: 10 | Status: AC

## 2018-07-01 MED FILL — Heparin Sodium (Porcine) Inj 1000 Unit/ML: INTRAMUSCULAR | Qty: 30 | Status: AC

## 2018-07-01 NOTE — Patient Outreach (Signed)
[facility]) Care Management  07/01/2018  Joshua Branch 07/17/1961 [id number]   Subjective: Telephone call to patient's home  / mobile number, no answer, left HIPAA compliant voicemail message, and requested call back.    Objective: Per KPN (Knowledge Performance Now, point of care tool) and chart review, patient hospitalized  06/22/18 -06/29/18 for Acute myocardial infarction, Acute coronary syndrome, Coronary artery disease involving native coronary artery with unstable angina pectoris, status post  Coronary Artery Bypass Grafting x 4  on 06/25/18.  Patient also has a history of diabetes, hypertension, hyperlipidemia, Obstructive sleep apnea, Nonhealing skin ulcer, Charcot's joint of left foot, non-diabetic, Eustachian tube dysfunction, and Active cochlear Meniere's disease of left ear.    Assessment:   Received [facility] Transition of care referral on 06/23/18.   Transition of care follow up pending patient contact.      Plan: RNCM will send unsuccessful outreach  letter, [facility] pamphlet, will call patient for 2nd telephone outreach attempt, transition of care follow up, and proceed with case closure, within 10 business days if no return call.       Jolan Mealor H. Annia Friendly, BSN, [place] Management [facility] Telephonic CM Phone: [phone] Fax: [phone]

## 2018-07-01 NOTE — Progress Notes (Signed)
Subjective: Joshua Branch is a 57 y.o. male patient seen in office for follow up evaluation of bilateral foot ulcers.  Patient has a history of diabetes and a blood glucose level today not recorded but in office today was 176. Last A1c 8.2. Patient is changing the dressing using  PRISMA. Reports that he has had heart surgery and does not feel the best. No other issues noted. Denies nausea/fever/vomiting/chills/night sweats/shortness of breath/pain.  Blood pressure 111/69, pulse 89, temperature (!) 96.3 F (35.7 C), resp. rate 16.  Patient Active Problem List   Diagnosis Date Noted  . 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 Acute coronary syndrome ([place]) 06/22/2018  . Uncontrolled type 2 diabetes mellitus ([place]) 06/22/2018  . Nonhealing skin ulcer ([place]) 06/22/2018  . 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 Acute myocardial infarction ([place]) 06/22/2018  . Hyperlipidemia   . Type II diabetes mellitus with complication, uncontrolled ([place])   . 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 Vertigo 08/20/2016  . Myringotomy tube status 08/20/2016  . Ear fullness, left 08/20/2016  . Asymmetrical left sensorineural hearing loss 02/06/2016  . Neuropathy of left foot 02/03/2015  . Charcot's joint of left foot, non-diabetic 02/03/2015  . 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 Hyperglycemia 07/21/2014  . Cramping of hands 06/02/2014  . Eustachian tube dysfunction 06/02/2014  . HTN (hypertension) 05/07/2014  . Obstructive sleep apnea 05/07/2014   Current Outpatient Medications on File Prior to Visit  Medication Sig Dispense Refill  . aspirin EC 81 MG tablet Take 81 mg by mouth daily.    Marland Kitchen atorvastatin (LIPITOR) 80 MG tablet Take 1 tablet (80 mg total) by mouth daily at 6 PM. 30 tablet 1  . clopidogrel (PLAVIX) 75 MG tablet Take 1 tablet (75 mg total) by mouth daily. 30 tablet 1  . lisinopril-hydrochlorothiazide  (PRINZIDE,ZESTORETIC) 10-12.5 MG tablet TAKE 1 TABLET BY MOUTH DAILY. 90 tablet 0  . metFORMIN (FORTAMET) 1000 MG (OSM) 24 hr tablet Take 1 tablet (1,000 mg total) by mouth 2 (two) times daily with a meal. 60 tablet 1  . metFORMIN (GLUCOPHAGE-XR) 500 MG 24 hr tablet   0  . metoprolol tartrate (LOPRESSOR) 25 MG tablet Take 1 tablet (25 mg total) by mouth 2 (two) times daily. 60 tablet 1  . oxyCODONE (OXY IR/ROXICODONE) 5 MG immediate release tablet Take 1-2 tablets (5-10 mg total) by mouth every 6 (six) hours as needed for up to 7 days for severe pain. 30 tablet 0  . traMADol (ULTRAM) 50 MG tablet   0   No current facility-administered medications on file prior to visit.    Allergies  Allergen Reactions  . Apricot Kernel Oil [Prunus] Swelling    THROAT  . Cherry Swelling    THROAT  . Other Other (See Comments)    Fruits with a pit. Throat swells up. Can have them if cooked   . Peach [Prunus Persica] Swelling    THROAT  . Plum Pulp Swelling    THROAT    Recent Results (from the past 2160 hour(s))  WOUND CULTURE     Status: Abnormal   Collection Time: 04/08/18 12:15 PM  Result Value Ref Range   MICRO NUMBER: [id number]    SPECIMEN QUALITY: ADEQUATE    SOURCE: LEFT    STATUS: FINAL    GRAM STAIN:      No white blood cells seen No epithelial cells seen Moderate Gram positive cocci in  pairs Moderate Gram negative bacilli   ISOLATE 1: methicillin resistant Staphylococcus aureus (A)     Comment: Heavy growth of Methicillin resistant Staphylococcus aureus (MRSA) Negative for inducible clindamycin resistance.   ISOLATE 2: Pseudomonas aeruginosa (A)     Comment: Heavy growth of Pseudomonas aeruginosa      Susceptibility   Methicillin resistant staphylococcus aureus - AEROBIC CULT, GRAM STAIN POSITIVE 1    VANCOMYCIN 1 Sensitive     CIPROFLOXACIN >=8 Resistant     CLINDAMYCIN <=0.25 Sensitive     LEVOFLOXACIN 4 Resistant     ERYTHROMYCIN >=8 Resistant     GENTAMICIN <=0.5 Sensitive      OXACILLIN* NR Resistant      * Oxacillin-resistant staphylococci are resistant toall currently available beta-lactam antimicrobialagents including penicillins, beta lactam/beta-lactamase inhibitor combinations, and cephems withstaphylococcal indications, including Cefazolin.    TETRACYCLINE <=1 Sensitive     TRIMETH/SULFA <=10 Sensitive    Pseudomonas aeruginosa - AEROBIC CULT, GRAM STAIN NEGATIVE 2    CEFTAZIDIME 4 Sensitive     CEFEPIME 2 Sensitive     IMIPENEM 2 Sensitive     PIP/TAZO 8 Sensitive     TOBRAMYCIN <=1 Sensitive     CIPROFLOXACIN <=0.25 Sensitive     LEVOFLOXACIN 1 Sensitive     GENTAMICIN* <=1 Sensitive      * Oxacillin-resistant staphylococci are resistant toall currently available beta-lactam antimicrobialagents including penicillins, beta lactam/beta-lactamase inhibitor combinations, and cephems withstaphylococcal indications, including Cefazolin.Legend:S = Susceptible  I = IntermediateR = Resistant  NS = Not susceptible* = Not tested  NR = Not reported**NN = See antimicrobic comments  WOUND CULTURE     Status: Abnormal   Collection Time: 04/08/18 12:15 PM  Result Value Ref Range   MICRO NUMBER: [id number]    SPECIMEN QUALITY: ADEQUATE    SOURCE: RIGHT FOOT SUBMET    STATUS: FINAL    GRAM STAIN: Gram positive cocci in chains     Comment: Rare Polymorphonuclear leukocytes Rare epithelial cells Moderate Gram positive cocci in clusters Few Gram positive cocci in chains   ISOLATE 1: methicillin resistant Staphylococcus aureus (A)     Comment: Heavy growth of Methicillin resistant Staphylococcus aureus (MRSA) Negative for inducible clindamycin resistance.   ISOLATE 2: Streptococcus agalactiae (A)     Comment: Moderate growth of Group B Streptococcus isolated Beta-hemolytic Streptococci are predictably susceptible to penicillin and other beta-lactams. Susceptibility testing not routinely performed.      Susceptibility   Methicillin resistant staphylococcus aureus - AEROBIC  CULT, GRAM STAIN POSITIVE 1    VANCOMYCIN 1 Sensitive     CIPROFLOXACIN >=8 Resistant     CLINDAMYCIN <=0.25 Sensitive     LEVOFLOXACIN 4 Resistant     ERYTHROMYCIN >=8 Resistant     GENTAMICIN <=0.5 Sensitive     OXACILLIN* NR Resistant      * Oxacillin-resistant staphylococci are resistant toall currently available beta-lactam antimicrobialagents including penicillins, beta lactam/beta-lactamase inhibitor combinations, and cephems withstaphylococcal indications, including Cefazolin.    TETRACYCLINE <=1 Sensitive     TRIMETH/SULFA* <=10 Sensitive      * Oxacillin-resistant staphylococci are resistant toall currently available beta-lactam antimicrobialagents including penicillins, beta lactam/beta-lactamase inhibitor combinations, and cephems withstaphylococcal indications, including Cefazolin.Legend:S = Susceptible  I = IntermediateR = Resistant  NS = Not susceptible* = Not tested  NR = Not reported**NN = See antimicrobic comments  Basic metabolic panel     Status: Abnormal   Collection Time: 06/22/18 10:03 AM  Result Value Ref Range   Sodium 129 (  L) 135 - 145 mmol/L   Potassium 4.0 3.5 - 5.1 mmol/L   Chloride 92 (L) 98 - 111 mmol/L   CO2 27 22 - 32 mmol/L   Glucose, Bld 295 (H) 70 - 99 mg/dL   BUN 17 6 - 20 mg/dL   Creatinine, Ser 0.97 0.61 - 1.24 mg/dL   Calcium 9.1 8.9 - 10.3 mg/dL   GFR calc non Af Amer >60 >60 mL/min   GFR calc Af Amer >60 >60 mL/min    Comment: (NOTE) The eGFR has been calculated using the CKD EPI equation. This calculation has not been validated in all clinical situations. eGFR's persistently <60 mL/min signify possible Chronic Kidney Disease.    Anion gap 10 5 - 15    Comment: Performed at [facility], [address] [place], [place] [address]  CBC     Status: Abnormal   Collection Time: 06/22/18 10:03 AM  Result Value Ref Range   WBC 15.4 (H) 4.0 - 10.5 K/uL   RBC 5.35 4.22 - 5.81 MIL/uL   Hemoglobin 16.1 13.0 - 17.0 g/dL   HCT 48.3 39.0  - 52.0 %   MCV 90.3 80.0 - 100.0 fL   MCH 30.1 26.0 - 34.0 pg   MCHC 33.3 30.0 - 36.0 g/dL   RDW 11.4 (L) 11.5 - 15.5 %   Platelets 258 150 - 400 K/uL   nRBC 0.0 0.0 - 0.2 %    Comment: Performed at [facility], [address] [place], [place] [address]  Troponin I - ONCE - STAT     Status: Abnormal   Collection Time: 06/22/18 10:03 AM  Result Value Ref Range   Troponin I 12.60 (HH) <0.03 ng/mL    Comment: CRITICAL RESULT CALLED TO, READ BACK BY AND VERIFIED WITH: REED C RN [phone] PHILLIPS C Performed at [facility], [address] [place], [place] [address]   MRSA PCR Screening     Status: Abnormal   Collection Time: 06/22/18  1:37 PM  Result Value Ref Range   MRSA by PCR POSITIVE (A) NEGATIVE    Comment:        The GeneXpert MRSA Assay (FDA approved for NASAL specimens only), is one component of a comprehensive MRSA colonization surveillance program. It is not intended to diagnose MRSA infection nor to guide or monitor treatment for MRSA infections. CRITICAL RESULT CALLED TO, READ BACK BY AND VERIFIED WITH: RN [place], [place] [phone] FCP Performed at [facility], [place] [address] [place], [place] [address]   ECHOCARDIOGRAM COMPLETE     Status: None   Collection Time: 06/22/18  2:45 PM  Result Value Ref Range   Weight 3,858.93 oz   Height 74.5 in   BP 136/94 mmHg  Glucose, capillary     Status: Abnormal   Collection Time: 06/22/18  3:52 PM  Result Value Ref Range   Glucose-Capillary 259 (H) 70 - 99 mg/dL  Urinalysis, Complete w Microscopic     Status: Abnormal   Collection Time: 06/22/18  4:45 PM  Result Value Ref Range   Color, Urine YELLOW YELLOW   APPearance CLEAR CLEAR   Specific Gravity, Urine 1.027 1.005 - 1.030   pH 5.0 5.0 - 8.0   Glucose, UA 150 (A) NEGATIVE mg/dL   Hgb urine dipstick NEGATIVE NEGATIVE   Bilirubin Urine NEGATIVE NEGATIVE   Ketones, ur 20 (A) NEGATIVE mg/dL   Protein, ur NEGATIVE NEGATIVE mg/dL  Nitrite NEGATIVE NEGATIVE   Leukocytes, UA NEGATIVE NEGATIVE   RBC / HPF 0-5 0 - 5 RBC/hpf   WBC, UA 0-5 0 - 5 WBC/hpf   Bacteria, UA NONE SEEN NONE SEEN   Mucus PRESENT     Comment: Performed at [facility] [address], [place], [place] [address]  HIV antibody (Routine Testing)     Status: None   Collection Time: 06/22/18  6:15 PM  Result Value Ref Range   HIV Screen 4th Generation wRfx Non Reactive Non Reactive    Comment: (NOTE) Performed At: [facility] [place], [place] [id number] Rush Farmer MD [id number]   Troponin I - Now Then Q12H     Status: Abnormal   Collection Time: 06/22/18  6:15 PM  Result Value Ref Range   Troponin I 9.09 (HH) <0.03 ng/mL    Comment: CRITICAL RESULT CALLED TO, READ BACK BY AND VERIFIED WITH: Trinna Post RN @ 1952 ON 06/22/18 BY TEMOCHE, H Performed at [facility], [place] [address] [place], [place] [address]   Glucose, capillary     Status: Abnormal   Collection Time: 06/22/18 10:03 PM  Result Value Ref Range   Glucose-Capillary 196 (H) 70 - 99 mg/dL  I-STAT 3, arterial blood gas (G3+)     Status: Abnormal   Collection Time: 06/23/18  3:53 AM  Result Value Ref Range   pH, Arterial 7.444 7.350 - 7.450   pCO2 arterial 36.1 32.0 - 48.0 mmHg   pO2, Arterial 63.0 (L) 83.0 - 108.0 mmHg   Bicarbonate 24.7 20.0 - 28.0 mmol/L   TCO2 26 22 - 32 mmol/L   O2 Saturation 93.0 %   Acid-Base Excess 1.0 0.0 - 2.0 mmol/L   Patient temperature 98.6 F    Collection site RADIAL, ALLEN'S TEST ACCEPTABLE    Drawn by Operator    Sample type ARTERIAL   Troponin I - Now Then Q12H     Status: Abnormal   Collection Time: 06/23/18  4:15 AM  Result Value Ref Range   Troponin I 8.07 (HH) <0.03 ng/mL    Comment: CRITICAL VALUE NOTED.  VALUE IS CONSISTENT WITH PREVIOUSLY REPORTED AND CALLED VALUE. Performed at [facility], [place] [address], [place], [place] [address]   CBC WITH DIFFERENTIAL     Status: Abnormal    Collection Time: 06/23/18  4:15 AM  Result Value Ref Range   WBC 11.7 (H) 4.0 - 10.5 K/uL   RBC 4.32 4.22 - 5.81 MIL/uL   Hemoglobin 13.1 13.0 - 17.0 g/dL   HCT 38.5 (L) 39.0 - 52.0 %   MCV 89.1 80.0 - 100.0 fL   MCH 30.3 26.0 - 34.0 pg   MCHC 34.0 30.0 - 36.0 g/dL   RDW 11.4 (L) 11.5 - 15.5 %   Platelets 248 150 - 400 K/uL   nRBC 0.0 0.0 - 0.2 %   Neutrophils Relative % 70 %   Neutro Abs 8.1 (H) 1.7 - 7.7 K/uL   Lymphocytes Relative 18 %   Lymphs Abs 2.1 0.7 - 4.0 K/uL   Monocytes Relative 12 %   Monocytes Absolute 1.4 (H) 0.1 - 1.0 K/uL   Eosinophils Relative 0 %   Eosinophils Absolute 0.0 0.0 - 0.5 K/uL   Basophils Relative 0 %   Basophils Absolute 0.1 0.0 - 0.1 K/uL   Immature Granulocytes 0 %   Abs Immature Granulocytes 0.05 0.00 - 0.07 K/uL    Comment: Performed at  [facility], [place] [address], [place], [place] [address]  Lipid panel     Status: Abnormal   Collection Time: 06/23/18  4:15 AM  Result Value Ref Range   Cholesterol 120 0 - 200 mg/dL   Triglycerides 94 <150 mg/dL   HDL 32 (L) >40 mg/dL   Total CHOL/HDL Ratio 3.8 RATIO   VLDL 19 0 - 40 mg/dL   LDL Cholesterol 69 0 - 99 mg/dL    Comment:        Total Cholesterol/HDL:CHD Risk Coronary Heart Disease Risk Table                     Men   Women  1/2 Average Risk   3.4   3.3  Average Risk       5.0   4.4  2 X Average Risk   9.6   7.1  3 X Average Risk  23.4   11.0        Use the calculated Patient Ratio above and the CHD Risk Table to determine the patient's CHD Risk.        ATP III CLASSIFICATION (LDL):  <100     mg/dL   Optimal  100-129  mg/dL   Near or Above                    Optimal  130-159  mg/dL   Borderline  160-189  mg/dL   High  >190     mg/dL   Very High Performed at [facility] [address], [place], [place] [address]   Heparin level (unfractionated)     Status: Abnormal   Collection Time: 06/23/18  4:15 AM  Result Value Ref Range   Heparin Unfractionated 1.32 (H) 0.30  - 0.70 IU/mL    Comment: RESULTS CONFIRMED BY MANUAL DILUTION (NOTE) If heparin results are below expected values, and patient dosage has  been confirmed, suggest follow up testing of antithrombin III levels. Performed at [facility], [place] [address] [place], [place] [address]   Brain natriuretic peptide     Status: None   Collection Time: 06/23/18  4:15 AM  Result Value Ref Range   B Natriuretic Peptide 73.0 0.0 - 100.0 pg/mL    Comment: Performed at [facility] [address], [place], [place] [address]  Comprehensive metabolic panel     Status: Abnormal   Collection Time: 06/23/18  4:15 AM  Result Value Ref Range   Sodium 132 (L) 135 - 145 mmol/L   Potassium 3.3 (L) 3.5 - 5.1 mmol/L   Chloride 98 98 - 111 mmol/L   CO2 24 22 - 32 mmol/L   Glucose, Bld 252 (H) 70 - 99 mg/dL   BUN 22 (H) 6 - 20 mg/dL   Creatinine, Ser 1.19 0.61 - 1.24 mg/dL   Calcium 7.8 (L) 8.9 - 10.3 mg/dL   Total Protein 6.0 (L) 6.5 - 8.1 g/dL   Albumin 2.7 (L) 3.5 - 5.0 g/dL   AST 27 15 - 41 U/L   ALT 20 0 - 44 U/L   Alkaline Phosphatase 49 38 - 126 U/L   Total Bilirubin 1.1 0.3 - 1.2 mg/dL   GFR calc non Af Amer >60 >60 mL/min   GFR calc Af Amer >60 >60 mL/min    Comment: (NOTE) The eGFR has been calculated using the CKD EPI equation. This calculation has not been validated in all clinical situations. eGFR's  persistently <60 mL/min signify possible Chronic Kidney Disease.    Anion gap 10 5 - 15    Comment: Performed at [facility] [address], [place], [place] [address]  APTT     Status: Abnormal   Collection Time: 06/23/18  4:15 AM  Result Value Ref Range   aPTT >200 (HH) 24 - 36 seconds    Comment:        IF BASELINE aPTT IS ELEVATED, SUGGEST PATIENT RISK ASSESSMENT BE USED TO DETERMINE APPROPRIATE ANTICOAGULANT THERAPY. CRITICAL RESULT CALLED TO, READ BACK BY AND VERIFIED WITH: REPEATED TO VERIFY S.BOYLINS,RN 06/23/18 0550 DAVISB Performed at [facility], [place] [address], [place], [place] [address]   Protime-INR     Status: Abnormal   Collection Time: 06/23/18  4:15 AM  Result Value Ref Range   Prothrombin Time 15.6 (H) 11.4 - 15.2 seconds   INR 1.25     Comment: Performed at [facility] [address] [place], [place] [address]  Hemoglobin A1c     Status: Abnormal   Collection Time: 06/23/18  4:15 AM  Result Value Ref Range   Hgb A1c MFr Bld 8.2 (H) 4.8 - 5.6 %    Comment: (NOTE) Pre diabetes:          5.7%-6.4% Diabetes:              >6.4% Glycemic control for   <7.0% adults with diabetes    Mean Plasma Glucose 188.64 mg/dL    Comment: Performed at [facility] [address] [place], [place] [address]  Type and screen [facility]     Status: None   Collection Time: 06/23/18  4:15 AM  Result Value Ref Range   ABO/RH(D) O POS    Antibody Screen NEG    Sample Expiration      06/26/2018 Performed at [facility], [place] [address], [place], [place] [address]   ABO/Rh     Status: None   Collection Time: 06/23/18  4:15 AM  Result Value Ref Range   ABO/RH(D)      O POS Performed at [facility] [address] [place], [place] [address]   Glucose, capillary     Status: Abnormal   Collection Time: 06/23/18  6:28 AM  Result Value Ref Range   Glucose-Capillary 213 (H) 70 - 99 mg/dL  Glucose, capillary     Status: Abnormal   Collection Time: 06/23/18  7:48 AM  Result Value Ref Range   Glucose-Capillary 218 (H) 70 - 99 mg/dL  Glucose, capillary     Status: Abnormal   Collection Time: 06/23/18 11:07 AM  Result Value Ref Range   Glucose-Capillary 248 (H) 70 - 99 mg/dL  Heparin level (unfractionated)     Status: Abnormal   Collection Time: 06/23/18  3:48 PM  Result Value Ref Range   Heparin Unfractionated <0.10 (L) 0.30 - 0.70 IU/mL    Comment: REPEATED TO VERIFY RESULTS VERIFIED VIA RECOLLECT (NOTE) If heparin results are below expected values, and patient dosage has  been  confirmed, suggest follow up testing of antithrombin III levels. Performed at [facility], [place] [address], [place], [place] [address]   Glucose, capillary     Status: Abnormal   Collection Time: 06/23/18  4:52 PM  Result Value Ref Range   Glucose-Capillary 219 (H) 70 - 99 mg/dL  Glucose, capillary     Status: Abnormal  Collection Time: 06/23/18  8:59 PM  Result Value Ref Range   Glucose-Capillary 168 (H) 70 - 99 mg/dL  Heparin level (unfractionated)     Status: Abnormal   Collection Time: 06/23/18 11:56 PM  Result Value Ref Range   Heparin Unfractionated <0.10 (L) 0.30 - 0.70 IU/mL    Comment: (NOTE) If heparin results are below expected values, and patient dosage has  been confirmed, suggest follow up testing of antithrombin III levels. Performed at [facility], [place] [address], [place], [place] [address]   Glucose, capillary     Status: Abnormal   Collection Time: 06/24/18  6:47 AM  Result Value Ref Range   Glucose-Capillary 243 (H) 70 - 99 mg/dL  Heparin level (unfractionated)     Status: Abnormal   Collection Time: 06/24/18  9:04 AM  Result Value Ref Range   Heparin Unfractionated <0.10 (L) 0.30 - 0.70 IU/mL    Comment: (NOTE) If heparin results are below expected values, and patient dosage has  been confirmed, suggest follow up testing of antithrombin III levels. Performed at [facility], [place] [address] [place], [place] [address]   CBC     Status: Abnormal   Collection Time: 06/24/18 10:33 AM  Result Value Ref Range   WBC 12.4 (H) 4.0 - 10.5 K/uL   RBC 4.53 4.22 - 5.81 MIL/uL   Hemoglobin 13.3 13.0 - 17.0 g/dL   HCT 40.6 39.0 - 52.0 %   MCV 89.6 80.0 - 100.0 fL   MCH 29.4 26.0 - 34.0 pg   MCHC 32.8 30.0 - 36.0 g/dL   RDW 11.4 (L) 11.5 - 15.5 %   Platelets 288 150 - 400 K/uL   nRBC 0.0 0.0 - 0.2 %    Comment: Performed at [facility], [place] [address], [place], [place] [address]  Basic metabolic panel     Status: Abnormal   Collection  Time: 06/24/18 10:33 AM  Result Value Ref Range   Sodium 132 (L) 135 - 145 mmol/L   Potassium 3.6 3.5 - 5.1 mmol/L   Chloride 96 (L) 98 - 111 mmol/L   CO2 24 22 - 32 mmol/L   Glucose, Bld 205 (H) 70 - 99 mg/dL   BUN 17 6 - 20 mg/dL   Creatinine, Ser 1.10 0.61 - 1.24 mg/dL   Calcium 8.6 (L) 8.9 - 10.3 mg/dL   GFR calc non Af Amer >60 >60 mL/min   GFR calc Af Amer >60 >60 mL/min   Anion gap 12 5 - 15    Comment: Performed at [facility] [address] [place], [place] [address]  Pulmonary function test     Status: None   Collection Time: 06/24/18 11:27 AM  Result Value Ref Range   FVC-Pre 4.23 L   FVC-%Pred-Pre 74 %   FEV1-Pre 3.38 L   FEV1-%Pred-Pre 77 %   FEV6-Pre 4.18 L   FEV6-%Pred-Pre 76 %   Pre FEV1/FVC ratio 80 %   FEV1FVC-%Pred-Pre 103 %   Pre FEV6/FVC Ratio 100 %   FEV6FVC-%Pred-Pre 103 %   FEF 25-75 Pre 3.12 L/sec   FEF2575-%Pred-Pre 85 %  Glucose, capillary     Status: Abnormal   Collection Time: 06/24/18 11:35 AM  Result Value Ref Range   Glucose-Capillary 199 (H) 70 - 99 mg/dL  Glucose, capillary     Status: Abnormal   Collection Time: 06/24/18  3:54 PM  Result Value Ref Range   Glucose-Capillary 166 (H) 70 -  99 mg/dL  Heparin level (unfractionated)     Status: Abnormal   Collection Time: 06/24/18  4:08 PM  Result Value Ref Range   Heparin Unfractionated <0.10 (L) 0.30 - 0.70 IU/mL    Comment: (NOTE) If heparin results are below expected values, and patient dosage has  been confirmed, suggest follow up testing of antithrombin III levels. Performed at [facility], [place] [address], [place], [place] [address] CORRECTED ON 11/26 AT 1851: PREVIOUSLY REPORTED AS <0.10   Glucose, capillary     Status: Abnormal   Collection Time: 06/24/18  8:40 PM  Result Value Ref Range   Glucose-Capillary 201 (H) 70 - 99 mg/dL  Surgical pcr screen     Status: Abnormal   Collection Time: 06/25/18 12:13 AM  Result Value Ref Range   MRSA, PCR POSITIVE (A)  NEGATIVE    Comment: RESULT CALLED TO, READ BACK BY AND VERIFIED WITH: Edrick Oh RN 0300 06/25/18 A BROWNING    Staphylococcus aureus POSITIVE (A) NEGATIVE    Comment: (NOTE) The Xpert SA Assay (FDA approved for NASAL specimens in patients 30 years of age and older), is one component of a comprehensive surveillance program. It is not intended to diagnose infection nor to guide or monitor treatment. Performed at [facility], [place] [address], [place], [place] [address]   Basic metabolic panel     Status: Abnormal   Collection Time: 06/25/18  4:24 AM  Result Value Ref Range   Sodium 133 (L) 135 - 145 mmol/L   Potassium 4.1 3.5 - 5.1 mmol/L   Chloride 99 98 - 111 mmol/L   CO2 26 22 - 32 mmol/L   Glucose, Bld 185 (H) 70 - 99 mg/dL   BUN 17 6 - 20 mg/dL   Creatinine, Ser 1.20 0.61 - 1.24 mg/dL   Calcium 8.5 (L) 8.9 - 10.3 mg/dL   GFR calc non Af Amer >60 >60 mL/min   GFR calc Af Amer >60 >60 mL/min   Anion gap 8 5 - 15    Comment: Performed at [facility], [place] [address], [place], [place] [address]  CBC     Status: Abnormal   Collection Time: 06/25/18  4:24 AM  Result Value Ref Range   WBC 10.3 4.0 - 10.5 K/uL   RBC 4.39 4.22 - 5.81 MIL/uL   Hemoglobin 13.3 13.0 - 17.0 g/dL   HCT 38.8 (L) 39.0 - 52.0 %   MCV 88.4 80.0 - 100.0 fL   MCH 30.3 26.0 - 34.0 pg   MCHC 34.3 30.0 - 36.0 g/dL   RDW 11.2 (L) 11.5 - 15.5 %   Platelets 259 150 - 400 K/uL   nRBC 0.0 0.0 - 0.2 %    Comment: Performed at [facility], [place] [address] [place], [place] [address]  I-STAT, [place] 8     Status: Abnormal   Collection Time: 06/25/18  8:31 AM  Result Value Ref Range   Sodium 134 (L) 135 - 145 mmol/L   Potassium 4.1 3.5 - 5.1 mmol/L   Chloride 98 98 - 111 mmol/L   BUN 19 6 - 20 mg/dL   Creatinine, Ser 0.80 0.61 - 1.24 mg/dL   Glucose, Bld 199 (H) 70 - 99 mg/dL   Calcium, Ion 1.14 (L) 1.15 - 1.40 mmol/L   TCO2 30 22 - 32 mmol/L   Hemoglobin 12.2 (L) 13.0 - 17.0 g/dL   HCT 36.0  (L) 39.0 - 52.0 %  I-STAT, chem 8     Status: Abnormal   Collection Time: 06/25/18  9:59 AM  Result Value Ref Range   Sodium 134 (L) 135 - 145 mmol/L   Potassium 4.3 3.5 - 5.1 mmol/L   Chloride 99 98 - 111 mmol/L   BUN 18 6 - 20 mg/dL   Creatinine, Ser 0.80 0.61 - 1.24 mg/dL   Glucose, Bld 187 (H) 70 - 99 mg/dL   Calcium, Ion 1.14 (L) 1.15 - 1.40 mmol/L   TCO2 31 22 - 32 mmol/L   Hemoglobin 10.5 (L) 13.0 - 17.0 g/dL   HCT 31.0 (L) 39.0 - 52.0 %  I-STAT, chem 8     Status: Abnormal   Collection Time: 06/25/18 10:25 AM  Result Value Ref Range   Sodium 133 (L) 135 - 145 mmol/L   Potassium 5.6 (H) 3.5 - 5.1 mmol/L   Chloride 98 98 - 111 mmol/L   BUN 17 6 - 20 mg/dL   Creatinine, Ser 0.80 0.61 - 1.24 mg/dL   Glucose, Bld 169 (H) 70 - 99 mg/dL   Calcium, Ion 1.03 (L) 1.15 - 1.40 mmol/L   TCO2 30 22 - 32 mmol/L   Hemoglobin 9.2 (L) 13.0 - 17.0 g/dL   HCT 27.0 (L) 39.0 - 52.0 %  I-STAT 3, arterial blood gas (G3+)     Status: Abnormal   Collection Time: 06/25/18 10:29 AM  Result Value Ref Range   pH, Arterial 7.367 7.350 - 7.450   pCO2 arterial 50.0 (H) 32.0 - 48.0 mmHg   pO2, Arterial 291.0 (H) 83.0 - 108.0 mmHg   Bicarbonate 28.7 (H) 20.0 - 28.0 mmol/L   TCO2 30 22 - 32 mmol/L   O2 Saturation 100.0 %   Acid-Base Excess 3.0 (H) 0.0 - 2.0 mmol/L   Patient temperature HIDE    Sample type ARTERIAL   I-STAT, chem 8     Status: Abnormal   Collection Time: 06/25/18 11:24 AM  Result Value Ref Range   Sodium 134 (L) 135 - 145 mmol/L   Potassium 5.2 (H) 3.5 - 5.1 mmol/L   Chloride 100 98 - 111 mmol/L   BUN 18 6 - 20 mg/dL   Creatinine, Ser 0.70 0.61 - 1.24 mg/dL   Glucose, Bld 175 (H) 70 - 99 mg/dL   Calcium, Ion 1.03 (L) 1.15 - 1.40 mmol/L   TCO2 27 22 - 32 mmol/L   Hemoglobin 10.2 (L) 13.0 - 17.0 g/dL   HCT 30.0 (L) 39.0 - 52.0 %  Platelet Count (on-pump coagulopathy panel)     Status: None   Collection Time: 06/25/18 11:26 AM  Result Value Ref Range   Platelets 200 150 - 400  K/uL    Comment: Performed at [facility], [place] [address], [place], [place] [address]  Hemoglobin and hematocrit, blood (on-pump coagulopathy lab panel)     Status: Abnormal   Collection Time: 06/25/18 11:26 AM  Result Value Ref Range   Hemoglobin 10.6 (L) 13.0 - 17.0 g/dL    Comment: RESULT CALLED TO, READ BACK BY AND VERIFIED WITH: A.ALVEREZ RN [id number] 06/25/18 MCCORMICK K REPEATED TO VERIFY    HCT 30.9 (L) 39.0 - 52.0 %    Comment: Performed at [facility] [address] , [place] [address]  I-STAT, [place] 8     Status: Abnormal   Collection Time: 06/25/18 12:18 PM  Result Value Ref Range   Sodium 135 135 - 145 mmol/L   Potassium 4.3 3.5 -  5.1 mmol/L   Chloride 99 98 - 111 mmol/L   BUN 15 6 - 20 mg/dL   Creatinine, Ser 0.70 0.61 - 1.24 mg/dL   Glucose, Bld 176 (H) 70 - 99 mg/dL   Calcium, Ion 1.00 (L) 1.15 - 1.40 mmol/L   TCO2 27 22 - 32 mmol/L   Hemoglobin 9.2 (L) 13.0 - 17.0 g/dL   HCT 27.0 (L) 39.0 - 52.0 %  CBC     Status: Abnormal   Collection Time: 06/25/18  1:30 PM  Result Value Ref Range   WBC 18.4 (H) 4.0 - 10.5 K/uL   RBC 3.90 (L) 4.22 - 5.81 MIL/uL   Hemoglobin 11.9 (L) 13.0 - 17.0 g/dL    Comment: REPEATED TO VERIFY   HCT 35.3 (L) 39.0 - 52.0 %   MCV 90.5 80.0 - 100.0 fL   MCH 30.5 26.0 - 34.0 pg   MCHC 33.7 30.0 - 36.0 g/dL   RDW 11.2 (L) 11.5 - 15.5 %   Platelets 206 150 - 400 K/uL   nRBC 0.0 0.0 - 0.2 %    Comment: Performed at [facility], [place] [address] [facility], [place] [address]  Protime-INR     Status: Abnormal   Collection Time: 06/25/18  1:30 PM  Result Value Ref Range   Prothrombin Time 16.9 (H) 11.4 - 15.2 seconds   INR 1.39     Comment: Performed at [facility] [address] [place],  [address]  APTT     Status: Abnormal   Collection Time: 06/25/18  1:30 PM  Result Value Ref Range   aPTT 38 (H) 24 - 36 seconds    Comment:        IF BASELINE aPTT IS ELEVATED, SUGGEST PATIENT RISK ASSESSMENT BE  USED TO DETERMINE APPROPRIATE ANTICOAGULANT THERAPY. Performed at [facility], [place] [address] [place], [place] [address]   I-STAT 4, (NA,K, GLUC, HGB,HCT)     Status: Abnormal   Collection Time: 06/25/18  1:32 PM  Result Value Ref Range   Sodium 137 135 - 145 mmol/L   Potassium 4.3 3.5 - 5.1 mmol/L   Glucose, Bld 145 (H) 70 - 99 mg/dL   HCT 33.0 (L) 39.0 - 52.0 %   Hemoglobin 11.2 (L) 13.0 - 17.0 g/dL  I-STAT 3, arterial blood gas (G3+)     Status: Abnormal   Collection Time: 06/25/18  1:59 PM  Result Value Ref Range   pH, Arterial 7.341 (L) 7.350 - 7.450   pCO2 arterial 44.1 32.0 - 48.0 mmHg   pO2, Arterial 113.0 (H) 83.0 - 108.0 mmHg   Bicarbonate 24.0 20.0 - 28.0 mmol/L   TCO2 25 22 - 32 mmol/L   O2 Saturation 98.0 %   Acid-base deficit 2.0 0.0 - 2.0 mmol/L   Patient temperature 36.4 C    Sample type ARTERIAL   Glucose, capillary     Status: Abnormal   Collection Time: 06/25/18  2:36 PM  Result Value Ref Range   Glucose-Capillary 121 (H) 70 - 99 mg/dL  Glucose, capillary     Status: None   Collection Time: 06/25/18  3:31 PM  Result Value Ref Range   Glucose-Capillary 98 70 - 99 mg/dL  Glucose, capillary     Status: None   Collection Time: 06/25/18  4:29 PM  Result Value Ref Range   Glucose-Capillary 90 70 - 99 mg/dL  I-STAT 3, arterial blood gas (G3+)     Status: Abnormal  Collection Time: 06/25/18  4:46 PM  Result Value Ref Range   pH, Arterial 7.401 7.350 - 7.450   pCO2 arterial 34.6 32.0 - 48.0 mmHg   pO2, Arterial 135.0 (H) 83.0 - 108.0 mmHg   Bicarbonate 21.4 20.0 - 28.0 mmol/L   TCO2 22 22 - 32 mmol/L   O2 Saturation 99.0 %   Acid-base deficit 3.0 (H) 0.0 - 2.0 mmol/L   Patient temperature 37.3 C    Sample type ARTERIAL   Glucose, capillary     Status: Abnormal   Collection Time: 06/25/18  5:25 PM  Result Value Ref Range   Glucose-Capillary 120 (H) 70 - 99 mg/dL  I-STAT 3, arterial blood gas (G3+)     Status: Abnormal   Collection Time: 06/25/18   6:00 PM  Result Value Ref Range   pH, Arterial 7.351 7.350 - 7.450   pCO2 arterial 40.6 32.0 - 48.0 mmHg   pO2, Arterial 117.0 (H) 83.0 - 108.0 mmHg   Bicarbonate 22.5 20.0 - 28.0 mmol/L   TCO2 24 22 - 32 mmol/L   O2 Saturation 98.0 %   Acid-base deficit 3.0 (H) 0.0 - 2.0 mmol/L   Patient temperature HIDE    Sample type ARTERIAL   Glucose, capillary     Status: Abnormal   Collection Time: 06/25/18  6:28 PM  Result Value Ref Range   Glucose-Capillary 108 (H) 70 - 99 mg/dL  CBC     Status: Abnormal   Collection Time: 06/25/18  7:37 PM  Result Value Ref Range   WBC 11.5 (H) 4.0 - 10.5 K/uL   RBC 3.69 (L) 4.22 - 5.81 MIL/uL   Hemoglobin 11.3 (L) 13.0 - 17.0 g/dL   HCT 33.5 (L) 39.0 - 52.0 %   MCV 90.8 80.0 - 100.0 fL   MCH 30.6 26.0 - 34.0 pg   MCHC 33.7 30.0 - 36.0 g/dL   RDW 11.4 (L) 11.5 - 15.5 %   Platelets 179 150 - 400 K/uL   nRBC 0.0 0.0 - 0.2 %    Comment: Performed at [facility], [place] [address], [place], [place] [address]  Magnesium     Status: Abnormal   Collection Time: 06/25/18  7:37 PM  Result Value Ref Range   Magnesium 3.1 (H) 1.7 - 2.4 mg/dL    Comment: Performed at [facility] [address], [place], [place] [address]  Creatinine, serum     Status: None   Collection Time: 06/25/18  7:37 PM  Result Value Ref Range   Creatinine, Ser 0.87 0.61 - 1.24 mg/dL   GFR calc non Af Amer >60 >60 mL/min   GFR calc Af Amer >60 >60 mL/min    Comment: Performed at [facility] [address] [place], [place] [address]  I-STAT, [place] 8     Status: Abnormal   Collection Time: 06/25/18  7:39 PM  Result Value Ref Range   Sodium 135 135 - 145 mmol/L   Potassium 4.8 3.5 - 5.1 mmol/L   Chloride 104 98 - 111 mmol/L   BUN 18 6 - 20 mg/dL   Creatinine, Ser 0.70 0.61 - 1.24 mg/dL   Glucose, Bld 119 (H) 70 - 99 mg/dL   Calcium, Ion 1.02 (L) 1.15 - 1.40 mmol/L   TCO2 23 22 - 32 mmol/L   Hemoglobin 10.2 (L) 13.0 - 17.0 g/dL   HCT 30.0 (L) 39.0 - 52.0 %   Glucose, capillary     Status: Abnormal  Collection Time: 06/25/18  8:50 PM  Result Value Ref Range   Glucose-Capillary 114 (H) 70 - 99 mg/dL  Glucose, capillary     Status: Abnormal   Collection Time: 06/25/18 10:03 PM  Result Value Ref Range   Glucose-Capillary 108 (H) 70 - 99 mg/dL  Glucose, capillary     Status: Abnormal   Collection Time: 06/25/18 11:11 PM  Result Value Ref Range   Glucose-Capillary 141 (H) 70 - 99 mg/dL  Glucose, capillary     Status: Abnormal   Collection Time: 06/26/18 12:26 AM  Result Value Ref Range   Glucose-Capillary 116 (H) 70 - 99 mg/dL  Glucose, capillary     Status: Abnormal   Collection Time: 06/26/18  1:11 AM  Result Value Ref Range   Glucose-Capillary 113 (H) 70 - 99 mg/dL  Glucose, capillary     Status: Abnormal   Collection Time: 06/26/18  2:02 AM  Result Value Ref Range   Glucose-Capillary 110 (H) 70 - 99 mg/dL  Glucose, capillary     Status: Abnormal   Collection Time: 06/26/18  3:12 AM  Result Value Ref Range   Glucose-Capillary 108 (H) 70 - 99 mg/dL  CBC     Status: Abnormal   Collection Time: 06/26/18  3:13 AM  Result Value Ref Range   WBC 11.7 (H) 4.0 - 10.5 K/uL   RBC 3.64 (L) 4.22 - 5.81 MIL/uL   Hemoglobin 10.8 (L) 13.0 - 17.0 g/dL   HCT 33.5 (L) 39.0 - 52.0 %   MCV 92.0 80.0 - 100.0 fL   MCH 29.7 26.0 - 34.0 pg   MCHC 32.2 30.0 - 36.0 g/dL   RDW 11.4 (L) 11.5 - 15.5 %   Platelets 190 150 - 400 K/uL   nRBC 0.0 0.0 - 0.2 %    Comment: Performed at [facility], [place]. [address] [place], [place] [address]  Basic metabolic panel     Status: Abnormal   Collection Time: 06/26/18  3:13 AM  Result Value Ref Range   Sodium 131 (L) 135 - 145 mmol/L   Potassium 4.3 3.5 - 5.1 mmol/L   Chloride 104 98 - 111 mmol/L   CO2 21 (L) 22 - 32 mmol/L   Glucose, Bld 108 (H) 70 - 99 mg/dL   BUN 17 6 - 20 mg/dL   Creatinine, Ser 0.93 0.61 - 1.24 mg/dL   Calcium 7.2 (L) 8.9 - 10.3 mg/dL   GFR calc non Af Amer >60 >60 mL/min   GFR  calc Af Amer >60 >60 mL/min   Anion gap 6 5 - 15    Comment: Performed at [facility], [place] [address], [place],  [address]  Magnesium     Status: Abnormal   Collection Time: 06/26/18  3:13 AM  Result Value Ref Range   Magnesium 3.0 (H) 1.7 - 2.4 mg/dL    Comment: Performed at [facility] [address], [place], [place] [address]  Glucose, capillary     Status: Abnormal   Collection Time: 06/26/18  4:08 AM  Result Value Ref Range   Glucose-Capillary 103 (H) 70 - 99 mg/dL  Glucose, capillary     Status: Abnormal   Collection Time: 06/26/18  5:14 AM  Result Value Ref Range   Glucose-Capillary 100 (H) 70 - 99 mg/dL  Glucose, capillary     Status: None   Collection Time: 06/26/18  6:07 AM  Result Value Ref Range   Glucose-Capillary 92 70 -  99 mg/dL  Glucose, capillary     Status: Abnormal   Collection Time: 06/26/18  7:28 AM  Result Value Ref Range   Glucose-Capillary 106 (H) 70 - 99 mg/dL  Glucose, capillary     Status: Abnormal   Collection Time: 06/26/18  9:15 AM  Result Value Ref Range   Glucose-Capillary 192 (H) 70 - 99 mg/dL  Glucose, capillary     Status: Abnormal   Collection Time: 06/26/18 12:39 PM  Result Value Ref Range   Glucose-Capillary 208 (H) 70 - 99 mg/dL   Comment 1 Notify RN   Magnesium     Status: None   Collection Time: 06/26/18  4:53 PM  Result Value Ref Range   Magnesium 2.2 1.7 - 2.4 mg/dL    Comment: Performed at [facility] [address], [place], [place] [address]  CBC     Status: Abnormal   Collection Time: 06/26/18  4:53 PM  Result Value Ref Range   WBC 13.7 (H) 4.0 - 10.5 K/uL   RBC 3.82 (L) 4.22 - 5.81 MIL/uL   Hemoglobin 11.9 (L) 13.0 - 17.0 g/dL   HCT 34.8 (L) 39.0 - 52.0 %   MCV 91.1 80.0 - 100.0 fL   MCH 31.2 26.0 - 34.0 pg   MCHC 34.2 30.0 - 36.0 g/dL   RDW 11.6 11.5 - 15.5 %   Platelets 201 150 - 400 K/uL   nRBC 0.0 0.0 - 0.2 %    Comment: Performed at [facility], [place] [address]  [place], [place] [address]  Creatinine, serum     Status: None   Collection Time: 06/26/18  4:53 PM  Result Value Ref Range   Creatinine, Ser 0.94 0.61 - 1.24 mg/dL   GFR calc non Af Amer >60 >60 mL/min   GFR calc Af Amer >60 >60 mL/min    Comment: Performed at [facility] [address] [place], [place] [address]  I-STAT, [place] 8     Status: Abnormal   Collection Time: 06/26/18  5:01 PM  Result Value Ref Range   Sodium 131 (L) 135 - 145 mmol/L   Potassium 4.4 3.5 - 5.1 mmol/L   Chloride 94 (L) 98 - 111 mmol/L   BUN 17 6 - 20 mg/dL   Creatinine, Ser 0.70 0.61 - 1.24 mg/dL   Glucose, Bld 223 (H) 70 - 99 mg/dL   Calcium, Ion 1.05 (L) 1.15 - 1.40 mmol/L   TCO2 26 22 - 32 mmol/L   Hemoglobin 12.2 (L) 13.0 - 17.0 g/dL   HCT 36.0 (L) 39.0 - 52.0 %  Glucose, capillary     Status: Abnormal   Collection Time: 06/26/18  7:40 PM  Result Value Ref Range   Glucose-Capillary 245 (H) 70 - 99 mg/dL  Glucose, capillary     Status: Abnormal   Collection Time: 06/26/18 11:09 PM  Result Value Ref Range   Glucose-Capillary 219 (H) 70 - 99 mg/dL  Glucose, capillary     Status: Abnormal   Collection Time: 06/27/18  3:25 AM  Result Value Ref Range   Glucose-Capillary 111 (H) 70 - 99 mg/dL  Basic metabolic panel     Status: Abnormal   Collection Time: 06/27/18  3:50 AM  Result Value Ref Range   Sodium 131 (L) 135 - 145 mmol/L   Potassium 3.4 (L) 3.5 - 5.1 mmol/L    Comment: NO VISIBLE HEMOLYSIS   Chloride 98 98 - 111 mmol/L   CO2  28 22 - 32 mmol/L   Glucose, Bld 111 (H) 70 - 99 mg/dL   BUN 16 6 - 20 mg/dL   Creatinine, Ser 0.81 0.61 - 1.24 mg/dL   Calcium 7.5 (L) 8.9 - 10.3 mg/dL   GFR calc non Af Amer >60 >60 mL/min   GFR calc Af Amer >60 >60 mL/min   Anion gap 5 5 - 15    Comment: Performed at [facility] [address] [place], [place] [address]  CBC     Status: Abnormal   Collection Time: 06/27/18  3:50 AM  Result Value Ref Range   WBC 12.5 (H) 4.0 - 10.5 K/uL   RBC 3.81  (L) 4.22 - 5.81 MIL/uL   Hemoglobin 11.3 (L) 13.0 - 17.0 g/dL   HCT 34.9 (L) 39.0 - 52.0 %   MCV 91.6 80.0 - 100.0 fL   MCH 29.7 26.0 - 34.0 pg   MCHC 32.4 30.0 - 36.0 g/dL   RDW 11.6 11.5 - 15.5 %   Platelets 242 150 - 400 K/uL   nRBC 0.0 0.0 - 0.2 %    Comment: Performed at [facility], [place] [address], [place], [place] [address]  Glucose, capillary     Status: Abnormal   Collection Time: 06/27/18  8:10 AM  Result Value Ref Range   Glucose-Capillary 131 (H) 70 - 99 mg/dL  Glucose, capillary     Status: Abnormal   Collection Time: 06/27/18 11:35 AM  Result Value Ref Range   Glucose-Capillary 220 (H) 70 - 99 mg/dL  Glucose, capillary     Status: Abnormal   Collection Time: 06/27/18  4:28 PM  Result Value Ref Range   Glucose-Capillary 168 (H) 70 - 99 mg/dL  Glucose, capillary     Status: Abnormal   Collection Time: 06/27/18  9:27 PM  Result Value Ref Range   Glucose-Capillary 152 (H) 70 - 99 mg/dL  CBC     Status: Abnormal   Collection Time: 06/28/18  2:57 AM  Result Value Ref Range   WBC 12.9 (H) 4.0 - 10.5 K/uL   RBC 3.85 (L) 4.22 - 5.81 MIL/uL   Hemoglobin 11.4 (L) 13.0 - 17.0 g/dL   HCT 34.4 (L) 39.0 - 52.0 %   MCV 89.4 80.0 - 100.0 fL   MCH 29.6 26.0 - 34.0 pg   MCHC 33.1 30.0 - 36.0 g/dL   RDW 11.5 11.5 - 15.5 %   Platelets 279 150 - 400 K/uL   nRBC 0.0 0.0 - 0.2 %    Comment: Performed at [facility], [place] [address], [place], [place] [address]  Basic metabolic panel     Status: Abnormal   Collection Time: 06/28/18  2:57 AM  Result Value Ref Range   Sodium 134 (L) 135 - 145 mmol/L   Potassium 3.6 3.5 - 5.1 mmol/L   Chloride 98 98 - 111 mmol/L   CO2 26 22 - 32 mmol/L   Glucose, Bld 113 (H) 70 - 99 mg/dL   BUN 18 6 - 20 mg/dL   Creatinine, Ser 0.91 0.61 - 1.24 mg/dL   Calcium 7.7 (L) 8.9 - 10.3 mg/dL   GFR calc non Af Amer >60 >60 mL/min   GFR calc Af Amer >60 >60 mL/min   Anion gap 10 5 - 15    Comment: Performed at [facility], [place] [address] [place], [place] [address]  Glucose, capillary     Status: Abnormal  Collection Time: 06/28/18  6:20 AM  Result Value Ref Range   Glucose-Capillary 117 (H) 70 - 99 mg/dL  Glucose, capillary     Status: Abnormal   Collection Time: 06/28/18 12:13 PM  Result Value Ref Range   Glucose-Capillary 133 (H) 70 - 99 mg/dL  Glucose, capillary     Status: Abnormal   Collection Time: 06/28/18  4:28 PM  Result Value Ref Range   Glucose-Capillary 183 (H) 70 - 99 mg/dL  Glucose, capillary     Status: Abnormal   Collection Time: 06/28/18  9:04 PM  Result Value Ref Range   Glucose-Capillary 172 (H) 70 - 99 mg/dL  Glucose, capillary     Status: Abnormal   Collection Time: 06/29/18  5:56 AM  Result Value Ref Range   Glucose-Capillary 161 (H) 70 - 99 mg/dL    Objective: There were no vitals filed for this visit.  General: Patient is awake, alert, oriented x 3 and in no acute distress.  Dermatology: Skin is warm and dry bilateral with a full thickness ulceration present Right plantar forefoot. Ulceration measures 0.9 cm x 1.9cm x 0.3 cm. There is a keratotic border with a granular base. The ulceration does not probe to bone. There is no malodor, no active drainage, no erythema, no edema. No other acute signs of infection.  There is a new posterior heel ulceration measures 2cm x 2cm x 0.1 cm. There is a keratotic border with a granular base from pressure blister caused by patient shoe prior. The ulceration does not probe to bone. There is no malodor, no active drainage, no erythema, no edema. No other acute signs of infection.   The Left foot ulcerations are healed.    Vascular: Dorsalis Pedis pulse = 2/4 Bilateral,  Posterior Tibial pulse = 1/4 Bilateral,  Capillary Fill Time < 5 seconds  Neurologic: Protective sensation absent bilateral.  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 No Pain with palpation to ulcerated areas. No pain with compression to calves bilateral.   No results for  input(s): GRAMSTAIN, LABORGA in the last 8760 hours.  Assessment and Plan:  Problem List Items Addressed This Visit    None    Visit Diagnoses    Ulcers of both lower extremities, limited to breakdown of skin ([place])    -  Primary   Diabetic polyneuropathy associated with type 2 diabetes mellitus ([name]       Relevant Medications   metFORMIN (GLUCOPHAGE-XR) 500 MG 24 hr tablet   Charcot foot due to diabetes mellitus ([name]       Relevant Medications   metFORMIN (GLUCOPHAGE-XR) 500 MG 24 hr tablet   traMADol (ULTRAM) 50 MG tablet   Cellulitis and abscess of toe of left foot         -Examined patient and discussed the progression of the wound and treatment alternatives. - Excisionally dedbrided ulceration at right plantar forefoot and heel to healthy bleeding borders removing nonviable tissue using a sterile chisel blade. Wound measures post debridement as above.  Wounds were debrided to the level of the dermis with viable wound base exposed to promote healing. Hemostasis was achieved with manuel pressure. Patient tolerated procedure well without any discomfort or anesthesia necessary for this wound debridement.  -Applied PRISMA to right forefoot and iodosorb to right heel and instructed patient to continue with daily dressings at home consisting of same. -Continue post op shoes with offloading padding as previous  -Continue with offloading and good supportive shoes  - Advised patient  to go to the ER or return to office if the wound worsens or if constitutional symptoms are present. -Patient to return to office in 2-3 weeks for follow up care and evaluation or sooner if problems arise.  Landis Martins, DPM

## 2018-07-03 ENCOUNTER — Encounter: Payer: Self-pay | Admitting: *Deleted

## 2018-07-03 ENCOUNTER — Other Ambulatory Visit: Payer: Self-pay | Admitting: *Deleted

## 2018-07-03 ENCOUNTER — Encounter: Payer: Self-pay | Admitting: Medical

## 2018-07-03 ENCOUNTER — Ambulatory Visit (INDEPENDENT_AMBULATORY_CARE_PROVIDER_SITE_OTHER): Payer: 59 | Admitting: Medical

## 2018-07-03 VITALS — BP 107/65 | HR 103 | Temp 97.6°F | Resp 16 | Ht 75.0 in | Wt 239.0 lb

## 2018-07-03 DIAGNOSIS — I10 Essential (primary) hypertension: Secondary | ICD-10-CM | POA: Diagnosis not present

## 2018-07-03 DIAGNOSIS — Z794 Long term (current) use of insulin: Secondary | ICD-10-CM | POA: Diagnosis not present

## 2018-07-03 DIAGNOSIS — L97319 Non-pressure chronic ulcer of right ankle with unspecified severity: Secondary | ICD-10-CM

## 2018-07-03 DIAGNOSIS — R5382 Chronic fatigue, unspecified: Secondary | ICD-10-CM

## 2018-07-03 DIAGNOSIS — E785 Hyperlipidemia, unspecified: Secondary | ICD-10-CM

## 2018-07-03 DIAGNOSIS — E1169 Type 2 diabetes mellitus with other specified complication: Secondary | ICD-10-CM | POA: Diagnosis not present

## 2018-07-03 MED ORDER — LANCETS MISC: 0 refills | Status: AC

## 2018-07-03 NOTE — Patient Instructions (Signed)
Glad to see that you are doing well post cardiac surgery.  We will continue aspirin, statin and blood pressure medications.  Your blood pressure was a little low today and will see if cardiologist wants your blood pressure to be at that level or possibly a little bit higher.  I do think your chest incision line looks clean and I do not see any obvious infection today.  Your sugars have come down a lot since you started on metformin and stricter diet.  Your A1c goal will be about 6.5-7.0.  So endocrinologist will likely add another medication.  Continue to follow-up with podiatrist for your healing foot ulcer.  Regarding your fatigue and possible depression, I would recommend watch your mood and could offer you a medication called Effexor if mood or energy not improving.  Also regarding fatigue blood work might be indicated if energy not improving significantly.  Reminder to ask your endocrinologist and cardiologist when you can get flu vaccine and pneumonia vaccine.  Follow-up in 3 months or as needed.

## 2018-07-03 NOTE — Progress Notes (Signed)
Subjective:    Patient ID: Joshua Branch, male    DOB: 12-28-1960, 57 y.o.   MRN: [id number]  HPI   Pt in for follow up  He had recent cabg x 4. I reviewed hospital admission and hospital course over past week. DC summary reads below   Admit date: 06/22/2018 Discharge date: 06/29/2018  Admission Diagnoses: Severe coronary artery disease status post acute STEMI  Discharge Diagnoses:  Principal Problem:   S/P CABG x 4 Active Problems:   HTN (hypertension)   Obstructive sleep apnea   Hyperglycemia   Acute coronary syndrome ([name]   Uncontrolled type 2 diabetes mellitus ([place])   Nonhealing skin ulcer ([name]   Coronary artery disease involving native coronary artery with unstable angina pectoris ([place])   Acute myocardial infarction ([place])   Hyperlipidemia   Type II diabetes mellitus with complication, uncontrolled ([place])      Patient Active Problem List   Diagnosis Date Noted  . 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 Acute coronary syndrome ([place]) 06/22/2018  . Uncontrolled type 2 diabetes mellitus ([place]) 06/22/2018  . Nonhealing skin ulcer ([place]) 06/22/2018  . Coronary artery disease involving native coronary artery with unstable angina pectoris ([place]) 06/22/2018  . Acute myocardial infarction ([place]) 06/22/2018  . Hyperlipidemia   . Type II diabetes mellitus with complication, uncontrolled ([place])   . 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 Vertigo 08/20/2016  . Myringotomy tube status 08/20/2016  . Ear fullness, left 08/20/2016  . Asymmetrical left sensorineural hearing loss 02/06/2016  . Neuropathy of left foot 02/03/2015  . Charcot's joint of left foot, non-diabetic 02/03/2015  . 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 Hyperglycemia 07/21/2014  . Cramping of hands 06/02/2014  . Eustachian tube dysfunction 06/02/2014  . HTN (hypertension) 05/07/2014  . Obstructive sleep apnea 05/07/2014   History of present illness:  The patient is a 57 year old male with no  previous history of coronary artery disease but multiple risk factors notable for hypertension, poorly controlled type 2 diabetes, hyperlipidemia as well as a family history of coronary artery disease who was admitted with a delayed presentation of acute anterior apical myocardial infarction.  The patient states that several months ago he began to develop episodes of postprandial sharp substernal chest pain.  He underwent a GI evaluation including endoscopy and colonoscopy and was told that he probably had reflux.  He was placed on omeprazole.  His symptoms were improved until Friday morning prior to admission when he developed a sudden onset of severe sharp substernal chest pain.  This pain was felt to be somewhat different than previous episodes and considerably more severe.  The symptoms continued to wax and wane for approximately 48 hours.  The patient was evaluated at [facility] where an EKG revealed sinus tachycardia with subtle ST segment elevation across the anterior and lateral leads.:  I was markedly elevated at 12 ng/mL.Marland Kitchen He was transferred to [facility] where he was seen in cardiology consultation by Dr. Virgina Jock and was admitted for further evaluation and treatment.  Discharged Condition: good  Hospital Course: The patient was admitted and underwent a cardiology evaluation to include cardiac catheterization which revealed severe three-vessel coronary artery disease with subtotal occlusion of the distal left anterior descending coronary artery.  There was mild left ventricular systolic dysfunction.  Thoracic surgical consultation was obtained with Dr. Roxy Manns evaluated patient and his studies and agree with recommendations to proceed with CABG.  On 06/25/2018 he was taken  to the operating room where he underwent the below described procedure.  He tolerated it well and was taken to the surgical intensive care unit in stable condition.  Postoperative hospital  course:  Patient did quite well.  He was weaned from the ventilator using standard postoperative protocols without difficulty.  He has remained hemodynamically stable and normal sinus rhythm.  All routine lines, monitors and drainage devices have been discontinued in the standard fashion.  He did have some postoperative volume overload but did respond well to diuretics.  Blood sugars have been under adequate control he has been seen by the diabetes coordinator and has a plan for follow-up for poorly controlled diabetes including endocrinologist.  His metformin dose was increased from previous.  His renal function has remained within normal limits.  He does have a minor stable acute blood loss anemia.  Oxygen has been weaned and he maintains good saturations on room air.  He tolerated gradually increasing activities using standard cardiac rehab protocols.  Incisions were healing well without evidence of infection.  At the time of discharge the patient is felt to be quite stable.  Consults: cardiology  Significant Diagnostic Studies: angiography: Cardiac catheterization  Treatments: surgery:  CARDIOTHORACIC SURGERY OPERATIVE NOTE  Date of Procedure:06/25/2018  Preoperative Diagnosis:  Severe 3-vessel Coronary Artery Disease  S/P Acute Myocardial Infarction  Postoperative Diagnosis:Same  Procedure:   Coronary Artery Bypass Grafting x4 Left Internal Mammary Artery to Distal Left Anterior Descending Coronary Artery Saphenous Vein Graft to Posterior Descending Coronary Artery Saphenous Vein Graft to SecondObtuse Marginal Branch of Left Circumflex Coronary Artery Sapheonous Vein Graft to Diagonal Branch Coronary Artery Endoscopic Vein Harvest from RightThigh and Lower Leg   Pt is feeling better presently. Pt not having any sob or dyspnea. No sob.  Pt  has appointment with Dr Renne Crigler endocrinologist  next week and has appointment with cardiologist same day.  Pt has known hyperlipidemia. He is on high dose statin.  Pt bp has been on low side recently.  He has some fatigue recently but states actually if he walks he feels more energetic.   Pt does snore. His wife states he does wake up at night. Pt 10 years ago had sleep study that was negative. He expresses does not want repeat work up presently.   Pt has some ulcer on feet. Rt foot ulcer near second toe slowly healing.  Today he expresses he really does not want to do any blood work at all as he had so many labs done while hospitalized.  Also did attempt to review prior visits/med recommendation in past with him today. But he indicated he indicated/I got the impression he did not want to dwell on past but rather concentrate on plan going forward.      Review of Systems  Constitutional: Positive for fatigue. Negative for chills and fever.       See hpi comments.  Respiratory: Negative for chest tightness, shortness of breath and wheezing.   Cardiovascular: Negative for chest pain and palpitations.  Gastrointestinal: Negative for abdominal pain, constipation, nausea and vomiting.  Musculoskeletal: Negative for back pain, joint swelling and neck stiffness.  Skin: Negative for rash.  Neurological: Negative for dizziness, seizures, weakness, light-headedness and headaches.       Some lower end bp readings but no light headedness. Particularly denies any on standing.  Hematological: Negative for adenopathy. Does not bruise/bleed easily.  Psychiatric/Behavioral: Negative for behavioral problems and confusion.    Past Medical History:  Diagnosis Date  .  Acute myocardial infarction ([place]) 06/22/2018  . Allergy   . Charcot's joint of left foot, non-diabetic   . Coronary artery disease involving native coronary artery with unstable angina pectoris ([place]) 06/22/2018  . Diabetes ([place])    type 2  . Hyperlipidemia   . Hypertension   .  Neuropathy of left foot   . Obstructive sleep apnea   . Right foot ulcer ([place])   . 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 Type II diabetes mellitus with complication, uncontrolled ([place])   . Uncontrolled type 2 diabetes mellitus ([place])      Social History   Socioeconomic History  . Marital status: Married    Spouse name: [name]  . Number of children: 2  . Years of education: Not on file  . Highest education level: Professional school degree (e.g., MD, DDS, DVM, JD)  Occupational History  . Occupation: attorney  Social Needs  . Financial resource strain: Not hard at all  . Food insecurity:    Worry: Not on file    Inability: Not on file  . Transportation needs:    Medical: Not on file    Non-medical: Not on file  Tobacco Use  . Smoking status: Never Smoker  . Smokeless tobacco: Never Used  Substance and Sexual Activity  . Alcohol use: Yes    Alcohol/week: 2.0 standard drinks    Types: 2 Cans of beer per week    Comment: 2 cans of beer a month  . Drug use: Never  . Sexual activity: Not on file  Lifestyle  . Physical activity:    Days per week: Not on file    Minutes per session: Not on file  . Stress: Not on file  Relationships  . Social connections:    Talks on phone: Not on file    Gets together: Not on file    Attends religious service: Not on file    Active member of club or organization: Not on file    Attends meetings of clubs or organizations: Not on file    Relationship status: Not on file  . Intimate partner violence:    Fear of current or ex partner: Not on file    Emotionally abused: Not on file    Physically abused: Not on file    Forced sexual activity: Not on file  Other Topics Concern  . Not on file  Social History Narrative  . Not on file    Past Surgical History:  Procedure Laterality Date  . CORONARY ARTERY BYPASS GRAFT N/A 06/25/2018   Procedure: CORONARY ARTERY BYPASS  GRAFTING (CABG) TIMES FOUR USING LEFT INTERNAL MAMMARY ARTERY AND RIGHT ENDOSCOPICALLY HARVESTED SAPHENOUS VEIN;  Surgeon: Rexene Alberts, MD;  Location: [facility];  Service: Open Heart Surgery;  Laterality: N/A;  . ENDOVEIN HARVEST OF GREATER SAPHENOUS VEIN Right 06/25/2018   Procedure: ENDOVEIN HARVEST OF GREATER SAPHENOUS VEIN;  Surgeon: Rexene Alberts, MD;  Location: [facility];  Service: Open Heart Surgery;  Laterality: Right;  . LEFT HEART CATH AND CORONARY ANGIOGRAPHY N/A 06/22/2018   Procedure: LEFT HEART CATH AND CORONARY ANGIOGRAPHY;  Surgeon: Nigel Mormon, MD;  Location: [facility];  Service: Cardiovascular;  Laterality: N/A;  . surgical debridement  Right    Right foot ulcer  . TEE WITHOUT CARDIOVERSION N/A 06/25/2018   Procedure: TRANSESOPHAGEAL ECHOCARDIOGRAM (TEE);  Surgeon: Darylene Price  H, MD;  Location: [facility];  Service: Open Heart Surgery;  Laterality: N/A;    Family History  Problem Relation Age of Onset  . Diabetes Mother   . Stroke Father   . Hypertension Father   . Hearing loss Father   . Colon polyps Father   . Colon cancer Neg Hx   . Esophageal cancer Neg Hx   . Prostate cancer Neg Hx     Allergies  Allergen Reactions  . Apricot Kernel Oil [Prunus] Swelling    THROAT  . Cherry Swelling    THROAT  . Other Other (See Comments)    Fruits with a pit. Throat swells up. Can have them if cooked   . Peach [Prunus Persica] Swelling    THROAT  . Plum Pulp Swelling    THROAT    Current Outpatient Medications on File Prior to Visit  Medication Sig Dispense Refill  . aspirin EC 81 MG tablet Take 81 mg by mouth daily.    Marland Kitchen atorvastatin (LIPITOR) 80 MG tablet Take 1 tablet (80 mg total) by mouth daily at 6 PM. 30 tablet 1  . clopidogrel (PLAVIX) 75 MG tablet Take 1 tablet (75 mg total) by mouth daily. 30 tablet 1  . lisinopril-hydrochlorothiazide (PRINZIDE,ZESTORETIC) 10-12.5 MG tablet TAKE 1 TABLET BY MOUTH DAILY. 90 tablet 0  . metoprolol tartrate  (LOPRESSOR) 25 MG tablet Take 1 tablet (25 mg total) by mouth 2 (two) times daily. 60 tablet 1  . traMADol (ULTRAM) 50 MG tablet   0   No current facility-administered medications on file prior to visit.     BP 107/65   Pulse (!) 103   Temp 97.6 F (36.4 C) (Oral)   Resp 16   Ht 6\' 3"  (1.905 m)   Wt 239 lb (108.4 kg)   SpO2 100%   BMI 29.87 kg/m       Objective:   Physical Exam  General Mental Status- Alert. General Appearance- Not in acute distress.   Skin General: Color- Normal Color. Moisture- Normal Moisture.    Chest and Lung Exam Auscultation: Breath Sounds:-Normal.  Cardiovascular Auscultation:Rythm- Regular. Murmurs & Other Heart Sounds:Auscultation of the heart reveals- No Murmurs.   Neurologic Cranial Nerve exam:- CN III-XII intact(No nystagmus), symmetric smile. Strength:- 5/5 equal and symmetric strength both upper and lower extremities.   Lower ext- no pedal edema.  Skin- midline sternal incision looks clean, dry with scab present. No DC.     Assessment & Plan:  Glad to see that you are doing well post cardiac surgery.  We will continue aspirin, statin and blood pressure medications.  Your blood pressure was a little low today and will see if cardiologist wants your blood pressure to be at that level or possibly a little bit higher.  I do think your chest incision line looks clean and I do not see any obvious infection today.  Your sugars have come down a lot since you started on metformin and stricter diet.  Your A1c goal will be about 6.5-7.0.  So endocrinologist will likely add another medication.  Continue to follow-up with podiatrist for your healing foot ulcer.  Regarding your fatigue and possible depression, I would recommend watch your mood and could offer you a medication called Effexor if mood or energy not improving.  Also regarding fatigue blood work might be indicated if energy not improving significantly.  Reminder to ask your  endocrinologist and cardiologist when you can get flu vaccine and pneumonia vaccine.  Follow-up  in 3 months or as needed.  Mackie Pai, PA-C

## 2018-07-03 NOTE — Patient Outreach (Addendum)
[facility]) Care Management  07/03/2018  Joshua Branch May 11, 1961 [id number]   Subjective: Telephone call to patient's home / mobile number, spoke with patient, and HIPAA verified.  Discussed [facility] Care Management UMR Transition of care follow up, patient voiced understanding, and is in agreement to follow up.   Patient states he is doing great, doing pretty well, and a lot better than he thought he would be doing after such a major event.   Patient states his pain is well controlled with medication as needed,  has a follow up appointment with primary provider today, and the following providers: cardiologist on 07/09/18, endocrinologist on 07/15/18, and cardiologist / surgeon on 07/16/18.   Patient states he has chosen to follow up with another cardiologist and surgeon, rather than surgeon that did his once recent surgery, has that appointment scheduled.  Patient states he is able to manage self care and has assistance as needed.  Patient voices understanding of medical diagnosis, surgery, and treatment plan.  States he is accessing the following [facility] benefits: outpatient pharmacy, hospital indemnity (not chosen), and wife who is the [employer] has family medical leave act [employer]) in place.  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   Patient states he does not have any education material, transition of care, care coordination, disease management, disease monitoring, transportation, community resource, or pharmacy needs at this time.  Discussed diabetes disease management resources for [facility] employees and advised RNCM will send web sites in the successful outreach letter.    Patient decline to have a referral sent to [employer] [facility]  Diabetes management at this time and will contact provider as needed.   Patient states he had some exceptional caregivers while he was in the hospital and is planning to follow up with hospital operator to access the Office of Patient  Experience or Discharging Unit Manager to recognize these individuals.  States he is very appreciative of the follow up and is in agreement to receive [place] Management information.       Objective: Per KPN (Knowledge Performance Now, point of care tool) and chart review, patient hospitalized  06/22/18 -06/29/18 for Acute myocardial infarction, Acute coronary syndrome, Coronary artery disease involving native coronary artery with unstable angina pectoris, status post  Coronary Artery Bypass Grafting x4 on 06/25/18.  Patient also has a history of diabetes, hypertension, hyperlipidemia, Obstructive sleep apnea, Nonhealing skin ulcer, Charcot's joint of left foot, non-diabetic, Eustachian tube dysfunction, and Active cochlear Meniere's disease of left ear.    Assessment:   Received UMR Transition of care referral on 06/23/18.   Transition of care follow up completed, no care management needs, and will proceed with case closure.       Plan: RNCM will send patient successful outreach letter, [facility] pamphlet, and magnet. RNCM will complete case closure due to follow up completed / no care management needs.        Jamien Casanova H. Annia Friendly, BSN, [facility] Management [facility] Telephonic CM Phone: [phone] Fax: [phone]

## 2018-07-15 ENCOUNTER — Telehealth: Payer: Self-pay | Admitting: Medical

## 2018-07-15 ENCOUNTER — Ambulatory Visit (INDEPENDENT_AMBULATORY_CARE_PROVIDER_SITE_OTHER): Payer: 59 | Admitting: Sports Medicine

## 2018-07-15 ENCOUNTER — Encounter: Payer: Self-pay | Admitting: Sports Medicine

## 2018-07-15 ENCOUNTER — Encounter: Payer: Self-pay | Admitting: Internal Medicine

## 2018-07-15 ENCOUNTER — Ambulatory Visit (INDEPENDENT_AMBULATORY_CARE_PROVIDER_SITE_OTHER): Payer: 59 | Admitting: Internal Medicine

## 2018-07-15 VITALS — BP 110/60 | HR 107 | Ht 75.0 in | Wt 234.0 lb

## 2018-07-15 DIAGNOSIS — E1159 Type 2 diabetes mellitus with other circulatory complications: Secondary | ICD-10-CM

## 2018-07-15 DIAGNOSIS — E11610 Type 2 diabetes mellitus with diabetic neuropathic arthropathy: Secondary | ICD-10-CM

## 2018-07-15 DIAGNOSIS — E1165 Type 2 diabetes mellitus with hyperglycemia: Secondary | ICD-10-CM | POA: Diagnosis not present

## 2018-07-15 DIAGNOSIS — L97921 Non-pressure chronic ulcer of unspecified part of left lower leg limited to breakdown of skin: Secondary | ICD-10-CM | POA: Diagnosis not present

## 2018-07-15 DIAGNOSIS — L03032 Cellulitis of left toe: Secondary | ICD-10-CM

## 2018-07-15 DIAGNOSIS — R200 Anesthesia of skin: Secondary | ICD-10-CM | POA: Diagnosis not present

## 2018-07-15 DIAGNOSIS — E1142 Type 2 diabetes mellitus with diabetic polyneuropathy: Secondary | ICD-10-CM

## 2018-07-15 DIAGNOSIS — L97911 Non-pressure chronic ulcer of unspecified part of right lower leg limited to breakdown of skin: Secondary | ICD-10-CM | POA: Diagnosis not present

## 2018-07-15 DIAGNOSIS — L02612 Cutaneous abscess of left foot: Secondary | ICD-10-CM

## 2018-07-15 LAB — VITAMIN B12: Vitamin B-12: 642 pg/mL (ref 211–911)

## 2018-07-15 MED ORDER — GLUCOSE BLOOD VI STRP: ORAL_STRIP | 12 refills | Status: DC

## 2018-07-15 MED ORDER — EMPAGLIFLOZIN 10 MG PO TABS: 10.0000 mg | ORAL_TABLET | Freq: Every day | ORAL | 5 refills | Status: DC

## 2018-07-15 MED ORDER — METFORMIN HCL 500 MG PO TABS: 1000.0000 mg | ORAL_TABLET | Freq: Two times a day (BID) | ORAL | 3 refills | Status: DC

## 2018-07-15 MED ORDER — LISINOPRIL 10 MG PO TABS: 10.0000 mg | ORAL_TABLET | Freq: Every day | ORAL | 3 refills | Status: DC

## 2018-07-15 MED FILL — metFORMIN HCL 500 MG TABS: 500 | 90 days supply | Qty: 360 | Fill #0

## 2018-07-15 MED FILL — FREESTYLE LITE METER: 30 days supply | Qty: 1 | Fill #0

## 2018-07-15 MED FILL — JARDIANCE 10 MG TABLET: 10 | 30 days supply | Qty: 30 | Fill #0

## 2018-07-15 MED FILL — FREESTYLE LANCETS: 50 days supply | Qty: 100 | Fill #0

## 2018-07-15 MED FILL — LISINOPRIL 10 MG TABS: 10 | 90 days supply | Qty: 90 | Fill #0

## 2018-07-15 MED FILL — FREESTYLE LITE TEST STRIP: 50 days supply | Qty: 100 | Fill #0

## 2018-07-15 NOTE — Progress Notes (Signed)
Patient ID: Joshua Branch, male   DOB: 02/04/1961, 57 y.o.   MRN: [id number]   HPI: Joshua Branch is a 57 y.o.-year-old male, referred by Elise Benne, for management of DM2, dx before 2017, non-insulin-dependent, uncontrolled, with long-term complications (CAD - s/p AMI - s/p CABG x4 06/2018, nonhealing skin ulcer).  He was referred to endocrinology after his recent AMI at the beginning of the month.  He had to have CABG x4.  He is feeling much better with only some fatigue, but otherwise, with no shortness of breath and chest pain.  At discharge, his metformin was increased to target dose.  Reviewed latest HbA1c level: Lab Results  Component Value Date   HGBA1C 8.2 (H) 06/23/2018   HGBA1C 11.7 (H) 01/17/2018   HGBA1C 10.8 (H) 12/07/2016   HGBA1C 9.2 (H) 11/30/2015   He started to change his diet in 02/2018 >> eliminated fast foods, less sweets, less alcohol >> sugars improved significantly.  Pt is on a regimen of: - Metformin 1000 mg 2x a day, with meals - started 12/2017, increased 06/2018  Pt checks his sugars 1x a day and they are: - am: 155-165 - 2h after b'fast: 175-185, 200 - before lunch:  - 2h after lunch: n/c - before dinner: n/c - 2h after dinner: 180s - bedtime: n/c - nighttime: n/c Lowest sugar was 115-120 rarely; ?  hypoglycemia awareness..  Highest sugar was 204.  Glucometer: True Metrix  Pt's meals are: - Breakfast: protein shake, smoothie, yoghurt, occas. Eggs and bacon - Lunch: sandwich, chips - Dinner: chicken, veggie - Snacks: 3  - no CKD, last BUN/creatinine:  Lab Results  Component Value Date   BUN 18 06/28/2018   BUN 16 06/27/2018   CREATININE 0.91 06/28/2018   CREATININE 0.81 06/27/2018  On Lisinopril 10.  - + HL; last set of lipids: Lab Results  Component Value Date   CHOL 120 06/23/2018   HDL 32 (L) 06/23/2018   LDLCALC 69 06/23/2018   LDLDIRECT 93.0 01/17/2018   TRIG 94 06/23/2018   CHOLHDL 3.8 06/23/2018  On Lipitor  80.  - last eye exam was in 01/2017. No DR. Hypertensive Retinopathy. Dr. Rex Kras at [facility].  - + numbness and tingling in his feet, but also numbness in fingertips.  On ASA 81.  Pt has FH of DM in mother, sister.  He complains of numbness in the tips of his fingers.  ROS: Constitutional: no weight gain, no weight loss, + fatigue, no subjective hyperthermia, no subjective hypothermia, no nocturia Eyes: no blurry vision, no xerophthalmia ENT: no sore throat, no nodules palpated in neck, no dysphagia, no odynophagia, no hoarseness, no tinnitus, + hypoacusis Cardiovascular: no CP, no SOB, no palpitations, no leg swelling Respiratory: no cough, no SOB, no wheezing Gastrointestinal: no N, no V, no D, no C, + acid reflux Musculoskeletal: no muscle, no joint aches Skin: no rash, no hair loss Neurological: no tremors, + numbness in tips of his fingers, no tingling/no dizziness/no HAs Psychiatric: no depression, no anxiety + difficulty with erection  Past Medical History:  Diagnosis Date  . Acute myocardial infarction ([place]) 06/22/2018  . Allergy   . Charcot's joint of left foot, non-diabetic   . Coronary artery disease involving native coronary artery with unstable angina pectoris ([place]) 06/22/2018  . Diabetes ([place])    type 2  . Hyperlipidemia   . Hypertension   . Neuropathy of left foot   . Obstructive sleep apnea   . Right foot  ulcer ([place])   . 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 Type II diabetes mellitus with complication, uncontrolled ([place])   . Uncontrolled type 2 diabetes mellitus ([place])    Past Surgical History:  Procedure Laterality Date  . CORONARY ARTERY BYPASS GRAFT N/A 06/25/2018   Procedure: CORONARY ARTERY BYPASS GRAFTING (CABG) TIMES FOUR USING LEFT INTERNAL MAMMARY ARTERY AND RIGHT ENDOSCOPICALLY HARVESTED SAPHENOUS VEIN;  Surgeon: Rexene Alberts, MD;  Location: [facility];  Service: Open Heart  Surgery;  Laterality: N/A;  . ENDOVEIN HARVEST OF GREATER SAPHENOUS VEIN Right 06/25/2018   Procedure: ENDOVEIN HARVEST OF GREATER SAPHENOUS VEIN;  Surgeon: Rexene Alberts, MD;  Location: [facility];  Service: Open Heart Surgery;  Laterality: Right;  . LEFT HEART CATH AND CORONARY ANGIOGRAPHY N/A 06/22/2018   Procedure: LEFT HEART CATH AND CORONARY ANGIOGRAPHY;  Surgeon: Nigel Mormon, MD;  Location: [facility];  Service: Cardiovascular;  Laterality: N/A;  . surgical debridement  Right    Right foot ulcer  . TEE WITHOUT CARDIOVERSION N/A 06/25/2018   Procedure: TRANSESOPHAGEAL ECHOCARDIOGRAM (TEE);  Surgeon: Rexene Alberts, MD;  Location: [facility];  Service: Open Heart Surgery;  Laterality: N/A;   Social History   Socioeconomic History  . Marital status: Married    Spouse name: [name]  . Number of children: 2  . Years of education: Not on file  . Highest education level: Professional school degree (e.g., MD, DDS, DVM, JD)  Occupational History  . Occupation: attorney  Social Needs  . Financial resource strain: Not hard at all  . Food insecurity:    Worry: Not on file    Inability: Not on file  . Transportation needs:    Medical: Not on file    Non-medical: Not on file  Tobacco Use  . Smoking status: Never Smoker  . Smokeless tobacco: Never Used  Substance and Sexual Activity  . Alcohol use: Yes    Alcohol/week: 2.0 standard drinks    Types: 2 Cans of beer per week    Comment: 2 cans of beer a month  . Drug use: Never  . Sexual activity: Not on file  Lifestyle  . Physical activity:    Days per week: Not on file    Minutes per session: Not on file  . Stress: Not on file  Relationships  . Social connections:    Talks on phone: Not on file    Gets together: Not on file    Attends religious service: Not on file    Active member of club or organization: Not on file    Attends meetings of clubs or organizations: Not on file    Relationship status: Not on file  .  Intimate partner violence:    Fear of current or ex partner: Not on file    Emotionally abused: Not on file    Physically abused: Not on file    Forced sexual activity: Not on file  Other Topics Concern  . Not on file  Social History Narrative  . Not on file   Current Outpatient Medications on File Prior to Visit  Medication Sig Dispense Refill  . aspirin EC 81 MG tablet Take 81 mg by mouth daily.    Marland Kitchen atorvastatin (LIPITOR) 80 MG tablet Take 1 tablet (80 mg total) by mouth daily at 6 PM. 30 tablet 1  . clopidogrel (PLAVIX)  75 MG tablet Take 1 tablet (75 mg total) by mouth daily. 30 tablet 1  . Lancets MISC Check sugars 3 times a day E11.9 100 each 0  . lisinopril-hydrochlorothiazide (PRINZIDE,ZESTORETIC) 10-12.5 MG tablet TAKE 1 TABLET BY MOUTH DAILY. 90 tablet 0  . metoprolol tartrate (LOPRESSOR) 25 MG tablet Take 1 tablet (25 mg total) by mouth 2 (two) times daily. 60 tablet 1  . traMADol (ULTRAM) 50 MG tablet   0   No current facility-administered medications on file prior to visit.    Allergies  Allergen Reactions  . Apricot Kernel Oil [Prunus] Swelling    THROAT  . Cherry Swelling    THROAT  . Other Other (See Comments)    Fruits with a pit. Throat swells up. Can have them if cooked   . Peach [Prunus Persica] Swelling    THROAT  . Plum Pulp Swelling    THROAT   Family History  Problem Relation Age of Onset  . Diabetes Mother   . Stroke Father   . Hypertension Father   . Hearing loss Father   . Colon polyps Father   . Colon cancer Neg Hx   . Esophageal cancer Neg Hx   . Prostate cancer Neg Hx    PE: BP 110/60   Pulse (!) 107   Ht 6\' 3"  (1.905 m)   Wt 234 lb (106.1 kg)   SpO2 95%   BMI 29.25 kg/m  Wt Readings from Last 3 Encounters:  07/15/18 234 lb (106.1 kg)  07/03/18 239 lb (108.4 kg)  06/29/18 242 lb 11.6 oz (110.1 kg)   Constitutional: overweight, in NAD Eyes: PERRLA, EOMI, no exophthalmos ENT: moist mucous membranes, no thyromegaly, no cervical  lymphadenopathy Cardiovascular: tachycardia, RR, No MRG Respiratory: CTA B Gastrointestinal: abdomen soft, NT, ND, BS+ Musculoskeletal: no deformities, strength intact in all 4 Skin: moist, warm, no rashes Neurological: no tremor with outstretched hands, DTR normal in all 4  ASSESSMENT: 1. DM2, non-insulin-dependent, uncontrolled, with long-term complications - CAD - s/p AMI - s/p CABG x4 06/2018-sees Dr. Gwenlyn Found - nonhealing skin ulcer  2.  Numbness in fingers  PLAN:  1. Patient with long-standing, uncontrolled diabetes, on oral antidiabetic regimen, with metformin only, does increase after his heart attack at the beginning of the month.  He started to change his diet significantly this summer, and was also started on metformin 500 mg twice a day then.  His sugars started to improve significantly.  He was surprised about his recent MI due to these changes, but is aware that this was caused by years of uncontrolled diabetes. -At this visit, we discussed about maintaining the diet that he already started -We discussed also about continuing metformin since he is tolerating this well and has no CKD -We will try to add Jardiance - I explained the heart benefits of the drug -We will start a low dose due to his soft blood pressure.  He takes lisinopril 10 -HCTZ 12.5.  We will stop this and start only lisinopril 10 with Jardiance taking the place of the diuretic. -We discussed about mechanism of action and possible side effects of Jardiance - We will keep - I suggested to:  Patient Instructions  Please continue: - Metformin 1000 mg 2x a day  Please start: - Jardiance 10 mg daily before b/fast  Stop Lisinopril -HCTZ combination and start: - Lisinopril 10 mg daily   Please let me know if the sugars are consistently <80 or >200.  Please return in 3  months with your sugar log.   - Strongly advised him to start checking sugars at different times of the day - check 1-2x a day, rotating  checks - discussed about CBG targets for treatment: 80-130 mg/dL before meals and <180 mg/dL after meals; target HbA1c <7%. - given sugar log and advised how to fill it and to bring it at next appt  - given foot care handout and explained the principles  - given instructions for hypoglycemia management "15-15 rule"  - advised for yearly eye exams  - Return to clinic in 3 mo with sugar log   2.  Numbness in fingertips -I explained that long-term metformin can decrease the absorption of B12 vitamin -We will check a B12 level today  Component     Latest Ref Rng & Units 07/15/2018  Vitamin B12     211 - 911 pg/mL 642   Vitamin B12 normal.  His neuropathic symptoms may be related to his diabetes.  Will reevaluate after diabetes is under control.  Philemon Kingdom, MD PhD [facility] Endocrinology

## 2018-07-15 NOTE — Progress Notes (Signed)
Subjective: Joshua Branch is a 57 y.o. male patient seen in office for follow up evaluation of bilateral foot ulcers.  Patient has a history of diabetes and a blood glucose level today not recorded.  Patient is changing the dressing using  PRISMA and Iodosorb. No other issues noted. Denies nausea/fever/vomiting/chills/night sweats/shortness of breath/pain.  Blood pressure 111/69, pulse 89, temperature (!) 96.3 F (35.7 C), resp. rate 16.  Patient Active Problem List   Diagnosis Date Noted  . 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 Acute coronary syndrome ([place]) 06/22/2018  . Uncontrolled type 2 diabetes mellitus ([place]) 06/22/2018  . Nonhealing skin ulcer ([place]) 06/22/2018  . Coronary artery disease involving native coronary artery with unstable angina pectoris ([place]) 06/22/2018  . Acute myocardial infarction ([place]) 06/22/2018  . Hyperlipidemia   . Type II diabetes mellitus with complication, uncontrolled ([place])   . 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 Vertigo 08/20/2016  . Myringotomy tube status 08/20/2016  . Ear fullness, left 08/20/2016  . Asymmetrical left sensorineural hearing loss 02/06/2016  . Neuropathy of left foot 02/03/2015  . Charcot's joint of left foot, non-diabetic 02/03/2015  . 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 Hyperglycemia 07/21/2014  . Cramping of hands 06/02/2014  . Eustachian tube dysfunction 06/02/2014  . HTN (hypertension) 05/07/2014  . Obstructive sleep apnea 05/07/2014   Current Outpatient Medications on File Prior to Visit  Medication Sig Dispense Refill  . aspirin EC 81 MG tablet Take 81 mg by mouth daily.    Marland Kitchen atorvastatin (LIPITOR) 80 MG tablet Take 1 tablet (80 mg total) by mouth daily at 6 PM. 30 tablet 1  . clopidogrel (PLAVIX) 75 MG tablet Take 1 tablet (75 mg total) by mouth daily. 30 tablet 1  . Lancets MISC Check sugars 3 times a day E11.9 100 each 0  . lisinopril-hydrochlorothiazide (PRINZIDE,ZESTORETIC) 10-12.5 MG tablet TAKE 1 TABLET  BY MOUTH DAILY. 90 tablet 0  . metoprolol tartrate (LOPRESSOR) 25 MG tablet Take 1 tablet (25 mg total) by mouth 2 (two) times daily. 60 tablet 1  . traMADol (ULTRAM) 50 MG tablet   0   No current facility-administered medications on file prior to visit.    Allergies  Allergen Reactions  . Apricot Kernel Oil [Prunus] Swelling    THROAT  . Cherry Swelling    THROAT  . Other Other (See Comments)    Fruits with a pit. Throat swells up. Can have them if cooked   . Peach [Prunus Persica] Swelling    THROAT  . Plum Pulp Swelling    THROAT     Objective: There were no vitals filed for this visit.  General: Patient is awake, alert, oriented x 3 and in no acute distress.  Dermatology: Skin is warm and dry bilateral with a full thickness ulceration present Right plantar forefoot. Ulceration measures 1 cm x 1.4cm x 0.3 cm. There is a keratotic border with a granular base. The ulceration does not probe to bone. There is no malodor, no active drainage, no erythema, no edema. No other acute signs of infection.  There is a posterior heel ulceration measures 1.5cm x 2cm x 0.1 cm. There is a keratotic border with a granular base. The ulceration does not probe to bone. There is no malodor, no active drainage, no erythema, no edema. No other acute signs of infection.   The Left foot ulcerations are healed except pinpoint opening at left second toe.   Vascular: Dorsalis Pedis pulse = 2/4  Bilateral,  Posterior Tibial pulse = 1/4 Bilateral,  Capillary Fill Time < 5 seconds  Neurologic: Protective sensation absent bilateral.  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 No Pain with palpation to ulcerated areas. No pain with compression to calves bilateral.   No results for input(s): GRAMSTAIN, LABORGA in the last 8760 hours.  Assessment and Plan:  Problem List Items Addressed This Visit    None    Visit Diagnoses    Ulcers of both lower extremities, limited to breakdown of skin ([place])    -   Primary   Diabetic polyneuropathy associated with type 2 diabetes mellitus ([place])       Charcot foot due to diabetes mellitus ([place])       Cellulitis and abscess of toe of left foot         -Examined patient and discussed the progression of the wound and treatment alternatives. - Excisionally dedbrided ulceration at right plantar forefoot and heel and left second toe to healthy bleeding borders removing nonviable tissue using a sterile chisel blade. Wound measures post debridement as above.  Wounds were debrided to the level of the dermis with viable wound base exposed to promote healing. Hemostasis was achieved with manuel pressure. Patient tolerated procedure well without any discomfort or anesthesia necessary for this wound debridement.  -Applied PRISMA to right forefoot and iodosorb to right heel and left second toe; instructed patient to continue with daily dressings at home consisting of same. -Continue post op shoes with offloading padding as previous or tennis shoes that do not rub -Continue with offloading and good supportive shoes  - Advised patient to go to the ER or return to office if the wound worsens or if constitutional symptoms are present. -Patient to return to office in 3 weeks for follow up care and evaluation or sooner if problems arise.  Landis Martins, DPM

## 2018-07-15 NOTE — Telephone Encounter (Signed)
Will you call Dr. Renne Crigler office and see if pt has scheduled with for his diabetes appointment or can you find in epic and let me know if he has scheduled.

## 2018-07-15 NOTE — Patient Instructions (Addendum)
Please continue: - Metformin 1000 mg 2x a day  Please start: - Jardiance 10 mg daily before b/fast  Stop Lisinopril -HCTZ combination and start: - Lisinopril 10 mg daily   Please let me know if the sugars are consistently <80 or >200.  Please return in 3 months with your sugar log.   PATIENT INSTRUCTIONS FOR TYPE 2 DIABETES:  **Please join MyChart!** - see attached instructions about how to join if you have not done so already.  DIET AND EXERCISE Diet and exercise is an important part of diabetic treatment.  We recommended aerobic exercise in the form of brisk walking (working between 40-60% of maximal aerobic capacity, similar to brisk walking) for 150 minutes per week (such as 30 minutes five days per week) along with 3 times per week performing 'resistance' training (using various gauge rubber tubes with handles) 5-10 exercises involving the major muscle groups (upper body, lower body and core) performing 10-15 repetitions (or near fatigue) each exercise. Start at half the above goal but build slowly to reach the above goals. If limited by weight, joint pain, or disability, we recommend daily walking in a swimming pool with water up to waist to reduce pressure from joints while allow for adequate exercise.    BLOOD GLUCOSES Monitoring your blood glucoses is important for continued management of your diabetes. Please check your blood glucoses 2-4 times a day: fasting, before meals and at bedtime (you can rotate these measurements - e.g. one day check before the 3 meals, the next day check before 2 of the meals and before bedtime, etc.).   HYPOGLYCEMIA (low blood sugar) Hypoglycemia is usually a reaction to not eating, exercising, or taking too much insulin/ other diabetes drugs.  Symptoms include tremors, sweating, hunger, confusion, headache, etc. Treat IMMEDIATELY with 15 grams of Carbs: . 4 glucose tablets .  cup regular juice/soda . 2 tablespoons raisins . 4 teaspoons sugar . 1  tablespoon honey Recheck blood glucose in 15 mins and repeat above if still symptomatic/blood glucose <100.  RECOMMENDATIONS TO REDUCE YOUR RISK OF DIABETIC COMPLICATIONS: * Take your prescribed MEDICATION(S) * Follow a DIABETIC diet: Complex carbs, fiber rich foods, (monounsaturated and polyunsaturated) fats * AVOID saturated/trans fats, high fat foods, >2,300 mg salt per day. * EXERCISE at least 5 times a week for 30 minutes or preferably daily.  * DO NOT SMOKE OR DRINK more than 1 drink a day. * Check your FEET every day. Do not wear tightfitting shoes. Contact us if you develop an ulcer * See your EYE doctor once a year or more if needed * Get a FLU shot once a year * Get a PNEUMONIA vaccine once before and once after age 56 years  GOALS:  * Your Hemoglobin A1c of <7%  * fasting sugars need to be <130 * after meals sugars need to be <180 (2h after you start eating) * Your Systolic BP should be 725 or lower  * Your Diastolic BP should be 80 or lower  * Your HDL (Good Cholesterol) should be 40 or higher  * Your LDL (Bad Cholesterol) should be 100 or lower. * Your Triglycerides should be 150 or lower  * Your Urine microalbumin (kidney function) should be <30 * Your Body Mass Index should be 25 or lower    Please consider the following ways to cut down carbs and fat and increase fiber and micronutrients in your diet: - substitute whole grain for white bread or pasta - substitute brown rice for  white rice - substitute 90-calorie flat bread pieces for slices of bread when possible - substitute sweet potatoes or yams for white potatoes - substitute humus for margarine - substitute tofu for cheese when possible - substitute almond or rice milk for regular milk (would not drink soy milk daily due to concern for soy estrogen influence on breast cancer risk) - substitute dark chocolate for other sweets when possible - substitute water - can add lemon or orange slices for taste - for diet  sodas (artificial sweeteners will trick your body that you can eat sweets without getting calories and will lead you to overeating and weight gain in the long run) - do not skip breakfast or other meals (this will slow down the metabolism and will result in more weight gain over time)  - can try smoothies made from fruit and almond/rice milk in am instead of regular breakfast - can also try old-fashioned (not instant) oatmeal made with almond/rice milk in am - order the dressing on the side when eating salad at a restaurant (pour less than half of the dressing on the salad) - eat as little meat as possible - can try juicing, but should not forget that juicing will get rid of the fiber, so would alternate with eating raw veg./fruits or drinking smoothies - use as little oil as possible, even when using olive oil - can dress a salad with a mix of balsamic vinegar and lemon juice, for e.g. - use agave nectar, stevia sugar, or regular sugar rather than artificial sweateners - steam or broil/roast veggies  - snack on veggies/fruit/nuts (unsalted, preferably) when possible, rather than processed foods - reduce or eliminate aspartame in diet (it is in diet sodas, chewing gum, etc) Read the labels!  Try to read Dr. Janene Harvey book: "Program for Reversing Diabetes" for other ideas for healthy eating.

## 2018-07-16 ENCOUNTER — Ambulatory Visit (INDEPENDENT_AMBULATORY_CARE_PROVIDER_SITE_OTHER): Payer: 59 | Admitting: Cardiovascular Disease

## 2018-07-16 ENCOUNTER — Encounter: Payer: Self-pay | Admitting: Cardiovascular Disease

## 2018-07-16 DIAGNOSIS — I10 Essential (primary) hypertension: Secondary | ICD-10-CM

## 2018-07-16 DIAGNOSIS — E785 Hyperlipidemia, unspecified: Secondary | ICD-10-CM | POA: Diagnosis not present

## 2018-07-16 NOTE — Assessment & Plan Note (Signed)
History of hyperlipidemia on high-dose statin therapy with recent lipid profile performed 06/23/2018 revealing total cholesterol 120, LDL 69 and HDL 32.

## 2018-07-16 NOTE — Progress Notes (Signed)
07/16/2018 Joshua Branch   [birth year]  [id number]  Primary Physician Saguier, Iris Pert Primary Cardiologist: Lorretta Harp MD [name], [place]  HPI:  Joshua Branch is a 57 y.o. moderately overweight married Caucasian male father of 2 children who is self-referred to be established in my practice for ongoing cardiovascular care.  He is a Optician, dispensing.  His wife is an occupational therapist at [employer].  His primary provider is Perlie Mayo PA-C in [place].  His risk factors include treated diabetes, hypertension and hyperlipidemia his mother did have a myocardial infarction.  He presented on 06/22/2018 with non-STEMI and cath performed radially by Dr. Randa Lynn revealed three-vessel disease.  His EF was normal though he did have apical wall motion abnormality.  3 days later he underwent coronary artery bypass grafting x4 by Dr. Roxy Manns with an excellent result.  He is recuperating nicely and has not yet seen Dr. Roxy Manns back for his first post hospital discharge visit.   Current Meds  Medication Sig  . aspirin EC 81 MG tablet Take 81 mg by mouth daily.  Marland Kitchen atorvastatin (LIPITOR) 80 MG tablet Take 1 tablet (80 mg total) by mouth daily at 6 PM.  . clopidogrel (PLAVIX) 75 MG tablet Take 1 tablet (75 mg total) by mouth daily.  . empagliflozin (JARDIANCE) 10 MG TABS tablet Take 10 mg by mouth daily.  Marland Kitchen glucose blood test strip Use as instructed to check 1-2x a day  . Lancets MISC Check sugars 3 times a day E11.9  . lisinopril (PRINIVIL,ZESTRIL) 10 MG tablet Take 1 tablet (10 mg total) by mouth daily.  . metFORMIN (GLUCOPHAGE) 500 MG tablet Take 2 tablets (1,000 mg total) by mouth 2 (two) times daily with a meal.  . metoprolol tartrate (LOPRESSOR) 25 MG tablet Take 1 tablet (25 mg total) by mouth 2 (two) times daily.  . traMADol (ULTRAM) 50 MG tablet      Allergies  Allergen Reactions  . Apricot Kernel Oil [Prunus] Swelling    THROAT  . Cherry Swelling   THROAT  . Other Other (See Comments)    Fruits with a pit. Throat swells up. Can have them if cooked   . Peach [Prunus Persica] Swelling    THROAT  . Plum Pulp Swelling    THROAT    Social History   Socioeconomic History  . Marital status: Married    Spouse name: [name]  . Number of children: 2  . Years of education: Not on file  . Highest education level: Professional school degree (e.g., MD, DDS, DVM, JD)  Occupational History  . Occupation: attorney  Social Needs  . Financial resource strain: Not hard at all  . Food insecurity:    Worry: Not on file    Inability: Not on file  . Transportation needs:    Medical: Not on file    Non-medical: Not on file  Tobacco Use  . Smoking status: Never Smoker  . Smokeless tobacco: Never Used  Substance and Sexual Activity  . Alcohol use: Yes    Alcohol/week: 2.0 standard drinks    Types: 2 Cans of beer per week    Comment: 2 cans of beer a month  . Drug use: Never  . Sexual activity: Not on file  Lifestyle  . Physical activity:    Days per week: Not on file    Minutes per session: Not on file  . Stress: Not on file  Relationships  .  Social connections:    Talks on phone: Not on file    Gets together: Not on file    Attends religious service: Not on file    Active member of club or organization: Not on file    Attends meetings of clubs or organizations: Not on file    Relationship status: Not on file  . Intimate partner violence:    Fear of current or ex partner: Not on file    Emotionally abused: Not on file    Physically abused: Not on file    Forced sexual activity: Not on file  Other Topics Concern  . Not on file  Social History Narrative  . Not on file     Review of Systems: General: negative for chills, fever, night sweats or weight changes.  Cardiovascular: negative for chest pain, dyspnea on exertion, edema, orthopnea, palpitations, paroxysmal nocturnal dyspnea or shortness of breath Dermatological: negative  for rash Respiratory: negative for cough or wheezing Urologic: negative for hematuria Abdominal: negative for nausea, vomiting, diarrhea, bright red blood per rectum, melena, or hematemesis Neurologic: negative for visual changes, syncope, or dizziness All other systems reviewed and are otherwise negative except as noted above.    Blood pressure 102/74, pulse (!) 102, height 6\' 3"  (1.905 m), weight 235 lb (106.6 kg).  General appearance: alert and no distress Neck: no adenopathy, no carotid bruit, no JVD, supple, symmetrical, trachea midline and thyroid not enlarged, symmetric, no tenderness/mass/nodules Lungs: clear to auscultation bilaterally Heart: regular rate and rhythm, S1, S2 normal, no murmur, click, rub or gallop Extremities: extremities normal, atraumatic, no cyanosis or edema Pulses: 2+ and symmetric Skin: Skin color, texture, turgor normal. No rashes or lesions Neurologic: Alert and oriented X 3, normal strength and tone. Normal symmetric reflexes. Normal coordination and gait  EKG not performed today  ASSESSMENT AND PLAN:   HTN (hypertension) History of essential hypertension on lisinopril and metoprolol blood with blood pressure measured today 102/74  Hyperlipidemia History of hyperlipidemia on high-dose statin therapy with recent lipid profile performed 06/23/2018 revealing total cholesterol 120, LDL 69 and HDL 32.  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 Virgina Jock 06/25/2018 revealing three-vessel disease with preserved LV function and apical hypokinesia.  3 days later he underwent coronary artery bypass grafting x4 by Dr. Roxy Manns with a LIMA to the LAD, vein graft to OM 2, right [name].  He was discharged home several days thereafter.  He is recuperating nicely.      Lorretta Harp MD FACP,FACC,FAHA, [facility] 07/16/2018 12:17 PM

## 2018-07-16 NOTE — Assessment & Plan Note (Signed)
History of essential hypertension on lisinopril and metoprolol blood with blood pressure measured today 102/74

## 2018-07-16 NOTE — Assessment & Plan Note (Signed)
She has CAD status post non-STEMI with cardiac catheterization performed by Dr. Virgina Jock 06/25/2018 revealing three-vessel disease with preserved LV function and apical hypokinesia.  3 days later he underwent coronary artery bypass grafting x4 by Dr. Roxy Manns with a LIMA to the LAD, vein graft to OM 2, right [name].  He was discharged home several days thereafter.  He is recuperating nicely.

## 2018-07-16 NOTE — Patient Instructions (Signed)
Medication Instructions:  Your physician recommends that you continue on your current medications as directed. Please refer to the Current Medication list given to you today.  If you need a refill on your cardiac medications before your next appointment, please call your pharmacy.   Lab work: NONE If you have labs (blood work) drawn today and your tests are completely normal, you will receive your results only by: . MyChart Message (if you have MyChart) OR . A paper copy in the mail If you have any lab test that is abnormal or we need to change your treatment, we will call you to review the results.  Testing/Procedures: NONE  Follow-Up: At [facility], you and your health needs are our priority.  As part of our continuing mission to provide you with exceptional heart care, we have created designated Provider Care Teams.  These Care Teams include your primary Cardiologist (physician) and Advanced Practice Providers (APPs -  Physician Assistants and Nurse Practitioners) who all work together to provide you with the care you need, when you need it. You will need a follow up appointment in 3 months WITH DR. BERRY.   

## 2018-07-17 NOTE — Telephone Encounter (Signed)
Pt is scheduled w/ Endo in March 2020.

## 2018-07-21 ENCOUNTER — Other Ambulatory Visit: Payer: Self-pay | Admitting: Thoracic Surgery (Cardiothoracic Vascular Surgery)

## 2018-07-21 ENCOUNTER — Telehealth: Payer: Self-pay | Admitting: Internal Medicine

## 2018-07-21 DIAGNOSIS — Z951 Presence of aortocoronary bypass graft: Secondary | ICD-10-CM

## 2018-07-21 NOTE — Telephone Encounter (Signed)
He can stop this for 2 days and then start either Invokana 100 mg or Farxiga 5 mg whichever is covered by his insurance.  He will need to check his insurance for preferred drug or need for PA

## 2018-07-21 NOTE — Telephone Encounter (Signed)
Patient called stating that the Jardiance that he was given recently is causing him severe GI distress.  Would like to know what can be done. States that this GI distress is "crippling"  Call back number [phone]

## 2018-07-21 NOTE — Telephone Encounter (Signed)
Please advise in the absence of Dr. Cruzita Lederer.

## 2018-07-22 MED ORDER — CANAGLIFLOZIN 100 MG PO TABS: 100.0000 mg | ORAL_TABLET | Freq: Every day | ORAL | 1 refills | Status: DC

## 2018-07-22 MED FILL — INVOKANA 100 MG TABLET: 100 | 30 days supply | Qty: 30 | Fill #0

## 2018-07-22 NOTE — Telephone Encounter (Signed)
Spoke to patient and he has stopped the Joshua Branch, his sx have resolved. Joshua Branch is not covered, Invokana is a step 2 covered and since he could not tolerate the preferred, we should be able to get it approved.  I let patient know and he agreed.  RX sent.

## 2018-07-28 ENCOUNTER — Ambulatory Visit (INDEPENDENT_AMBULATORY_CARE_PROVIDER_SITE_OTHER): Payer: Self-pay | Admitting: Thoracic Surgery (Cardiothoracic Vascular Surgery)

## 2018-07-28 ENCOUNTER — Telehealth: Payer: Self-pay | Admitting: Medical

## 2018-07-28 ENCOUNTER — Encounter: Payer: Self-pay | Admitting: Thoracic Surgery (Cardiothoracic Vascular Surgery)

## 2018-07-28 ENCOUNTER — Ambulatory Visit
Admission: RE | Admit: 2018-07-28 | Discharge: 2018-07-28 | Disposition: A | Discharge status: Home / Self Care | Payer: 59 | Source: Ambulatory Visit | Attending: Thoracic Surgery (Cardiothoracic Vascular Surgery) | Admitting: Thoracic Surgery (Cardiothoracic Vascular Surgery)

## 2018-07-28 VITALS — BP 130/84 | HR 86 | Resp 20 | Ht 75.0 in | Wt 241.0 lb

## 2018-07-28 DIAGNOSIS — Z951 Presence of aortocoronary bypass graft: Secondary | ICD-10-CM | POA: Diagnosis not present

## 2018-07-28 DIAGNOSIS — I2510 Atherosclerotic heart disease of native coronary artery without angina pectoris: Secondary | ICD-10-CM

## 2018-07-28 NOTE — Telephone Encounter (Signed)
Reviewed cardiologist notes. Is he starting the cardiac rehab program as cardiologist recommended?

## 2018-07-28 NOTE — Patient Instructions (Signed)
Continue all previous medications without any changes at this time  Continue to avoid any heavy lifting or strenuous use of your arms or shoulders for at least a total of three months from the time of surgery.  After three months you may gradually increase how much you lift or otherwise use your arms or chest as tolerated, with limits based upon whether or not activities lead to the return of significant discomfort.  You may return to driving an automobile as long as you are no longer requiring oral narcotic pain relievers during the daytime.  It would be wise to start driving only short distances during the daylight and gradually increase from there as you feel comfortable.  You are encouraged to enroll and participate in the outpatient cardiac rehab program beginning as soon as practical.  

## 2018-07-28 NOTE — Progress Notes (Signed)
[address]       [place] [address]             [phone]     CARDIOTHORACIC SURGERY OFFICE NOTE  Referring Provider is Patwardhan, Reynold Bowen, MD  Primary Cardiologist is Lorretta Harp, MD PCP is Saguier, Iris Pert   HPI:  Patient is a 57 year old male with no previous history of coronary artery disease but risk factors notable for history of hypertension, poorly controlled type 2 diabetes, hyperlipidemia, and a family history of coronary artery disease who presented with delayed presentation of acute anterior apical myocardial infarction and underwent coronary artery bypass grafting x4 on June 25, 2018.  [name] early postoperative recovery in the hospital was uneventful and he was discharged home on the fourth postoperative day.  Since hospital discharge she has been seen in follow-up by Dr. Gwenlyn Found he returns to our office for routine follow-up today.  He remains on dual antiplatelet therapy, high-dose statin, beta-blocker, and low-dose ACE inhibitor at this time.  He has been seen in follow-up by Dr. Cruzita Lederer who has been adjusting his medications for the management of diabetes.  He reports that overall he is doing fairly well.  He has minimal residual soreness in his chest.  He has been using tramadol to help him sleep at night but otherwise he has not required any sort of pain relievers.  He has no shortness of breath and overall he states that he feels better than he has felt in a long time.  He does note that he gets tired quickly in his exercise tolerance and endurance is still slowly improving.   Current Outpatient Medications  Medication Sig Dispense Refill  . aspirin EC 81 MG tablet Take 81 mg by mouth daily.    Marland Kitchen atorvastatin (LIPITOR) 80 MG tablet Take 1 tablet (80 mg total) by mouth daily at 6 PM. 30 tablet 1  . canagliflozin (INVOKANA) 100 MG TABS tablet Take 1 tablet (100 mg total) by mouth daily before breakfast. 30 tablet 1  .  clopidogrel (PLAVIX) 75 MG tablet Take 1 tablet (75 mg total) by mouth daily. 30 tablet 1  . glucose blood test strip Use as instructed to check 1-2x a day 100 each 12  . Lancets MISC Check sugars 3 times a day E11.9 100 each 0  . lisinopril (PRINIVIL,ZESTRIL) 10 MG tablet Take 1 tablet (10 mg total) by mouth daily. 90 tablet 3  . metFORMIN (GLUCOPHAGE) 500 MG tablet Take 2 tablets (1,000 mg total) by mouth 2 (two) times daily with a meal. 360 tablet 3  . metoprolol tartrate (LOPRESSOR) 25 MG tablet Take 1 tablet (25 mg total) by mouth 2 (two) times daily. 60 tablet 1  . traMADol (ULTRAM) 50 MG tablet   0   No current facility-administered medications for this visit.       Physical Exam:   BP 130/84   Pulse 86   Resp 20   Ht 6\' 3"  (1.905 m)   Wt 241 lb (109.3 kg)   SpO2 97% Comment: RA  BMI 30.12 kg/m   General:  Well-appearing  Chest:   Clear to auscultation  CV:   Regular rate and rhythm  Incisions:  Clean and dry and healing nicely, there is slight area of erythema surrounding a dry eschar in the lower one quarter of the patient's sternal incision.  There has been no associated drainage and there is no sign of significant cellulitis  Abdomen:  Soft nontender  Extremities:  Warm and well-perfused  Diagnostic Tests:  CHEST - 2 VIEW  COMPARISON:  Multiple priors, most recent 06/28/2018.  FINDINGS: Stable cardiomediastinal silhouette. Sequelae of median sternotomy for CABG. No consolidation or edema. No pneumothorax or significant effusion. Improved aeration from priors.  IMPRESSION: No active cardiopulmonary disease.   Electronically Signed   By: Staci Righter M.D.   On: 07/28/2018 12:42   Impression:  Patient is doing well approximately 4 weeks status post coronary artery bypass grafting  Plan:  We have not recommended any change the patient's current medications.  Have encouraged him to continue to gradually increase his physical activity as tolerated  with his primary limitation remaining that he refrain from heavy lifting or strenuous use of his arms or shoulders for at least another 2 months.  I think he may resume driving an automobile and I have encouraged him to enroll and participate in the cardiac rehab program.  We have discussed about specifically physical activities including golf and tennis and how to progress with his physical recovery.  All of his questions have been answered.  The patient will continue to follow-up with Dr. Gwenlyn Found.  He will return to our office for routine follow-up next November.  He will call and return sooner should specific problems or questions arise.      Valentina Gu. Roxy Manns, MD 07/28/2018 1:01 PM

## 2018-07-29 MED FILL — CLOPIDOGREL 75 MG TABLET: 75 | 30 days supply | Qty: 30 | Fill #0

## 2018-07-29 MED FILL — METOPROLOL TARTRATE 25 MG T: 25 | 30 days supply | Qty: 60 | Fill #0

## 2018-07-29 MED FILL — ATORVASTATIN 80 MG TABLET: 80 | 30 days supply | Qty: 30 | Fill #0

## 2018-07-31 NOTE — Telephone Encounter (Signed)
Pt states he will be starting program soon.

## 2018-08-05 ENCOUNTER — Ambulatory Visit (INDEPENDENT_AMBULATORY_CARE_PROVIDER_SITE_OTHER): Payer: 59 | Admitting: Sports Medicine

## 2018-08-05 ENCOUNTER — Encounter: Payer: Self-pay | Admitting: Sports Medicine

## 2018-08-05 ENCOUNTER — Ambulatory Visit (INDEPENDENT_AMBULATORY_CARE_PROVIDER_SITE_OTHER): Payer: 59

## 2018-08-05 DIAGNOSIS — L97921 Non-pressure chronic ulcer of unspecified part of left lower leg limited to breakdown of skin: Secondary | ICD-10-CM | POA: Diagnosis not present

## 2018-08-05 DIAGNOSIS — E11610 Type 2 diabetes mellitus with diabetic neuropathic arthropathy: Secondary | ICD-10-CM | POA: Diagnosis not present

## 2018-08-05 DIAGNOSIS — L97911 Non-pressure chronic ulcer of unspecified part of right lower leg limited to breakdown of skin: Secondary | ICD-10-CM | POA: Diagnosis not present

## 2018-08-05 DIAGNOSIS — E1142 Type 2 diabetes mellitus with diabetic polyneuropathy: Secondary | ICD-10-CM | POA: Diagnosis not present

## 2018-08-05 DIAGNOSIS — Z23 Encounter for immunization: Secondary | ICD-10-CM

## 2018-08-05 NOTE — Progress Notes (Addendum)
Subjective: Joshua Branch is a 58 y.o. male patient seen in office for follow up evaluation of bilateral foot ulcers.  Patient has a history of diabetes and a blood glucose level today not recorded, range 150s.  Patient is changing the dressing using  PRISMA. No other issues noted. Denies nausea/fever/vomiting/chills/night sweats/shortness of breath/pain.  Blood pressure 111/69, pulse 89, temperature (!) 96.3 F (35.7 C), resp. rate 16.  Patient Active Problem List   Diagnosis Date Noted  . Poorly controlled type 2 diabetes mellitus with circulatory disorder ([place]) 07/15/2018  . Numbness in both hands 07/15/2018  . 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 Acute coronary syndrome ([place]) 06/22/2018  . Nonhealing skin ulcer ([place]) 06/22/2018  . 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 Acute myocardial infarction ([place]) 06/22/2018  . Hyperlipidemia   . 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 Vertigo 08/20/2016  . Myringotomy tube status 08/20/2016  . Ear fullness, left 08/20/2016  . Asymmetrical left sensorineural hearing loss 02/06/2016  . Neuropathy of left foot 02/03/2015  . Charcot's joint of left foot, non-diabetic 02/03/2015  . 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 Hyperglycemia 07/21/2014  . Cramping of hands 06/02/2014  . Eustachian tube dysfunction 06/02/2014  . HTN (hypertension) 05/07/2014  . Obstructive sleep apnea 05/07/2014   Current Outpatient Medications on File Prior to Visit  Medication Sig Dispense Refill  . aspirin EC 81 MG tablet Take 81 mg by mouth daily.    Marland Kitchen atorvastatin (LIPITOR) 80 MG tablet Take 1 tablet (80 mg total) by mouth daily at 6 PM. 30 tablet 1  . canagliflozin (INVOKANA) 100 MG TABS tablet Take 1 tablet (100 mg total) by mouth daily before breakfast. 30 tablet 1  . clopidogrel (PLAVIX) 75 MG tablet Take 1 tablet (75 mg total) by mouth daily. 30 tablet 1  . glucose blood test strip  Use as instructed to check 1-2x a day 100 each 12  . Lancets MISC Check sugars 3 times a day E11.9 100 each 0  . lisinopril (PRINIVIL,ZESTRIL) 10 MG tablet Take 1 tablet (10 mg total) by mouth daily. 90 tablet 3  . metFORMIN (GLUCOPHAGE) 500 MG tablet Take 2 tablets (1,000 mg total) by mouth 2 (two) times daily with a meal. 360 tablet 3  . metoprolol tartrate (LOPRESSOR) 25 MG tablet Take 1 tablet (25 mg total) by mouth 2 (two) times daily. 60 tablet 1  . traMADol (ULTRAM) 50 MG tablet   0   No current facility-administered medications on file prior to visit.    Allergies  Allergen Reactions  . Apricot Kernel Oil [Prunus] Swelling    THROAT  . Cherry Swelling    THROAT  . Jardiance [Empagliflozin] Diarrhea and Nausea And Vomiting  . Other Other (See Comments)    Fruits with a pit. Throat swells up. Can have them if cooked   . Peach [Prunus Persica] Swelling    THROAT  . Plum Pulp Swelling    THROAT     Objective: There were no vitals filed for this visit.  General: Patient is awake, alert, oriented x 3 and in no acute distress.  Dermatology: Skin is warm and dry bilateral with a full thickness ulceration present Right plantar forefoot. Ulceration measures 0.5 cm x 0.2cm x 0.3 cm. There is a keratotic border with a granular base. The ulceration does not probe to bone. There is no malodor, no active drainage, no erythema, no edema.  No other acute signs of infection.  There is a posterior heel ulceration resolved. No other acute signs of infection.   The Left foot ulcerations are healed except pinpoint opening at left second toe measures now 0.2x0.5cm.   Vascular: Dorsalis Pedis pulse = 2/4 Bilateral,  Posterior Tibial pulse = 1/4 Bilateral,  Capillary Fill Time < 5 seconds  Neurologic: Protective sensation absent bilateral.  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 No Pain with palpation to ulcerated areas. No pain with compression to calves bilateral.   No results for  input(s): GRAMSTAIN, LABORGA in the last 8760 hours.  Assessment and Plan:  Problem List Items Addressed This Visit    None    Visit Diagnoses    Ulcers of both lower extremities, limited to breakdown of skin ([place])    -  Primary   Diabetic polyneuropathy associated with type 2 diabetes mellitus ([place])       Charcot foot due to diabetes mellitus ([place])         -Examined patient and discussed the progression of the wound and treatment alternatives. - Excisionally dedbrided ulceration at right plantar forefoot and heel and left second toe to healthy bleeding borders removing nonviable tissue using a sterile chisel blade. Wound measures post debridement as above.  Wounds were debrided to the level of the dermis with viable wound base exposed to promote healing. Hemostasis was achieved with manuel pressure. Patient tolerated procedure well without any discomfort or anesthesia necessary for this wound debridement.  -Applied PRISMA to right forefoot and iodosorb to left second toe; instructed patient to continue with daily dressings at home consisting of same. -Continue post op shoes with offloading padding as previous or tennis shoes that do not rub -Continue with offloading and good supportive shoes  - Advised patient to go to the ER or return to office if the wound worsens or if constitutional symptoms are present. -Patient to return to office in 3-4 weeks for follow up care and evaluation or sooner if problems arise.  Landis Martins, DPM

## 2018-08-28 MED ORDER — METOPROLOL TARTRATE 25 MG PO TABS: 25.0000 mg | ORAL_TABLET | Freq: Two times a day (BID) | ORAL | 3 refills | Status: DC

## 2018-08-28 MED ORDER — ATORVASTATIN CALCIUM 80 MG PO TABS: 80.0000 mg | ORAL_TABLET | Freq: Every day | ORAL | 3 refills | Status: DC

## 2018-08-28 MED ORDER — CLOPIDOGREL BISULFATE 75 MG PO TABS: 75.0000 mg | ORAL_TABLET | Freq: Every day | ORAL | 3 refills | Status: DC

## 2018-08-28 MED FILL — ATORVASTATIN 80 MG TABLET: 80 | 90 days supply | Qty: 90 | Fill #0

## 2018-08-28 MED FILL — INVOKANA 100 MG TABLET: 100 | 30 days supply | Qty: 30 | Fill #1

## 2018-08-28 MED FILL — METOPROLOL TARTRATE 25 MG T: 25 | 90 days supply | Qty: 180 | Fill #0

## 2018-08-28 MED FILL — CLOPIDOGREL 75 MG TABLET: 75 | 90 days supply | Qty: 90 | Fill #0

## 2018-08-28 NOTE — Telephone Encounter (Signed)
Spoke with pt, questions regarding medications answered. Refill sent to the pharmacy electronically.

## 2018-08-28 NOTE — Telephone Encounter (Signed)
Left message for pt to call.

## 2018-09-02 ENCOUNTER — Ambulatory Visit (INDEPENDENT_AMBULATORY_CARE_PROVIDER_SITE_OTHER): Payer: 59 | Admitting: Sports Medicine

## 2018-09-02 ENCOUNTER — Encounter: Payer: Self-pay | Admitting: Sports Medicine

## 2018-09-02 ENCOUNTER — Encounter: Payer: Self-pay | Admitting: Medical

## 2018-09-02 DIAGNOSIS — L97921 Non-pressure chronic ulcer of unspecified part of left lower leg limited to breakdown of skin: Secondary | ICD-10-CM

## 2018-09-02 DIAGNOSIS — L97911 Non-pressure chronic ulcer of unspecified part of right lower leg limited to breakdown of skin: Secondary | ICD-10-CM

## 2018-09-02 DIAGNOSIS — E1142 Type 2 diabetes mellitus with diabetic polyneuropathy: Secondary | ICD-10-CM

## 2018-09-02 DIAGNOSIS — E11610 Type 2 diabetes mellitus with diabetic neuropathic arthropathy: Secondary | ICD-10-CM | POA: Diagnosis not present

## 2018-09-02 NOTE — Progress Notes (Signed)
Subjective: Joshua Branch is a 58 y.o. male patient seen in office for follow up evaluation of bilateral foot ulcers.  Patient has a history of diabetes and a blood glucose level today not recorded. Patient is changing the dressing using  PRISMA. No other issues noted. Denies nausea/fever/vomiting/chills/night sweats/shortness of breath/pain. Patient wonders how long this is going to take to heal.   Patient Active Problem List   Diagnosis Date Noted  . Poorly controlled type 2 diabetes mellitus with circulatory disorder ([place]) 07/15/2018  . Numbness in both hands 07/15/2018  . 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 Acute coronary syndrome ([place]) 06/22/2018  . Nonhealing skin ulcer ([place]) 06/22/2018  . Coronary artery disease involving native coronary artery with unstable angina pectoris ([place]) 06/22/2018  . Acute myocardial infarction ([place]) 06/22/2018  . Hyperlipidemia   . 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 Vertigo 08/20/2016  . Myringotomy tube status 08/20/2016  . Ear fullness, left 08/20/2016  . Asymmetrical left sensorineural hearing loss 02/06/2016  . Neuropathy of left foot 02/03/2015  . Charcot's joint of left foot, non-diabetic 02/03/2015  . 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 Hyperglycemia 07/21/2014  . Cramping of hands 06/02/2014  . Eustachian tube dysfunction 06/02/2014  . HTN (hypertension) 05/07/2014  . Obstructive sleep apnea 05/07/2014   Current Outpatient Medications on File Prior to Visit  Medication Sig Dispense Refill  . aspirin EC 81 MG tablet Take 81 mg by mouth daily.    Marland Kitchen atorvastatin (LIPITOR) 80 MG tablet Take 1 tablet (80 mg total) by mouth daily at 6 PM. 90 tablet 3  . canagliflozin (INVOKANA) 100 MG TABS tablet Take 1 tablet (100 mg total) by mouth daily before breakfast. 30 tablet 1  . clopidogrel (PLAVIX) 75 MG tablet Take 1 tablet (75 mg total) by mouth daily. 90 tablet 3  . glucose blood test strip Use as instructed to check 1-2x a day  100 each 12  . Lancets MISC Check sugars 3 times a day E11.9 100 each 0  . lisinopril (PRINIVIL,ZESTRIL) 10 MG tablet Take 1 tablet (10 mg total) by mouth daily. 90 tablet 3  . metFORMIN (GLUCOPHAGE) 500 MG tablet Take 2 tablets (1,000 mg total) by mouth 2 (two) times daily with a meal. 360 tablet 3  . metoprolol tartrate (LOPRESSOR) 25 MG tablet Take 1 tablet (25 mg total) by mouth 2 (two) times daily. 180 tablet 3  . traMADol (ULTRAM) 50 MG tablet   0   No current facility-administered medications on file prior to visit.    Allergies  Allergen Reactions  . Apricot Kernel Oil [Prunus] Swelling    THROAT  . Cherry Swelling    THROAT  . Jardiance [Empagliflozin] Diarrhea and Nausea And Vomiting  . Other Other (See Comments)    Fruits with a pit. Throat swells up. Can have them if cooked   . Peach [Prunus Persica] Swelling    THROAT  . Plum Pulp Swelling    THROAT     Objective: There were no vitals filed for this visit.  General: Patient is awake, alert, oriented x 3 and in no acute distress.  Dermatology: Skin is warm and dry bilateral with a full thickness ulceration present Right plantar forefoot. Ulceration measures 0.8 cm x 0.3cm x 0.2 cm. There is a keratotic border with a granular base. The ulceration does not probe to bone. There is no malodor, no active drainage, no erythema, no edema. No other acute signs of infection.  The Left foot ulcerations are healed except pinpoint opening at left second toe and sub met 1 that measures now 0.2x0.2cm.   Vascular: Dorsalis Pedis pulse = 2/4 Bilateral,  Posterior Tibial pulse = 1/4 Bilateral,  Capillary Fill Time < 5 seconds  Neurologic: Protective sensation absent bilateral.  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 No Pain with palpation to ulcerated areas. No pain with compression to calves bilateral.   No results for input(s): GRAMSTAIN, LABORGA in the last 8760 hours.  Assessment and Plan:  Problem List Items Addressed  This Visit    None    Visit Diagnoses    Ulcers of both lower extremities, limited to breakdown of skin ([place])    -  Primary   Diabetic polyneuropathy associated with type 2 diabetes mellitus ([place])       Charcot foot due to diabetes mellitus ([place])         -Examined patient and discussed the progression of the wound and treatment alternatives. - Excisionally dedbrided ulceration at right plantar forefoot and left second toe and sub met 1 to healthy bleeding borders removing nonviable tissue using a sterile chisel blade. Wound measures post debridement as above.  Wounds were debrided to the level of the dermis with viable wound base exposed to promote healing. Hemostasis was achieved with manuel pressure. Patient tolerated procedure well without any discomfort or anesthesia necessary for this wound debridement.  -Applied PRISMA to right forefoot and iodosorb to left second toe and sub met 1; instructed patient to continue with daily dressings at home consisting of same. -Continue post op shoes with offloading padding as previous or tennis shoes that do not rub however patient presents in dress shoes without offloading padding and I advised patient that this can cause the wounds to be slow to heal because he hasn't been offloading like I recommended  - Advised patient to go to the ER or return to office if the wound worsens or if constitutional symptoms are present. -Patient to return to office in 4 weeks for final wound check or sooner if problems arise.  Landis Martins, DPM

## 2018-09-09 DIAGNOSIS — Z951 Presence of aortocoronary bypass graft: Secondary | ICD-10-CM | POA: Diagnosis not present

## 2018-09-22 ENCOUNTER — Other Ambulatory Visit: Payer: Self-pay | Admitting: Internal Medicine

## 2018-09-22 MED FILL — INVOKANA 100 MG TABLET: 100 | 30 days supply | Qty: 30 | Fill #0

## 2018-10-03 ENCOUNTER — Ambulatory Visit (INDEPENDENT_AMBULATORY_CARE_PROVIDER_SITE_OTHER): Payer: 59 | Admitting: Medical

## 2018-10-03 ENCOUNTER — Encounter: Payer: Self-pay | Admitting: Medical

## 2018-10-03 VITALS — BP 130/80 | HR 81 | Temp 98.1°F | Resp 16 | Ht 75.0 in | Wt 240.0 lb

## 2018-10-03 DIAGNOSIS — R0989 Other specified symptoms and signs involving the circulatory and respiratory systems: Secondary | ICD-10-CM | POA: Diagnosis not present

## 2018-10-03 DIAGNOSIS — I10 Essential (primary) hypertension: Secondary | ICD-10-CM | POA: Diagnosis not present

## 2018-10-03 DIAGNOSIS — E1169 Type 2 diabetes mellitus with other specified complication: Secondary | ICD-10-CM

## 2018-10-03 DIAGNOSIS — Z794 Long term (current) use of insulin: Secondary | ICD-10-CM | POA: Diagnosis not present

## 2018-10-03 DIAGNOSIS — E785 Hyperlipidemia, unspecified: Secondary | ICD-10-CM | POA: Diagnosis not present

## 2018-10-03 MED ORDER — CETIRIZINE HCL 10 MG PO TABS: 10.0000 mg | ORAL_TABLET | Freq: Every day | ORAL | 11 refills | Status: DC

## 2018-10-03 MED ORDER — AZELASTINE HCL 0.1 % NA SOLN: 2.0000 | Freq: Two times a day (BID) | NASAL | 1 refills | Status: DC

## 2018-10-03 MED FILL — CETIRIZINE HCL 10 MG TABS: 10 | 100 days supply | Qty: 100 | Fill #0

## 2018-10-03 MED FILL — AZELASTINE HCL 137 MCG SPRY: 0.1 | 25 days supply | Qty: 30 | Fill #0

## 2018-10-03 NOTE — Patient Instructions (Signed)
For your diabetes, recommend continue low sugar diet, exercise and medication your endocrinologist as prescribed.  For history of hyperlipidemia and coronary artery disease, recommend continue high-dose statin and will place future lab to check a lipid panel and metabolic panel.  States she drank alcohol last night will delay getting labs to get accurate measure of your liver enzymes.  No alcohol over the next week as that can raise liver enzymes.  Your blood pressure is under good control when I rechecked it today.  130/80 is good level for diabetic patients.  Do recommend checking your blood pressure 2-3 times a week may be middle a day would be better to get accurate measure.  You do report spring allergies and very runny nose.  You might have combination of vasomotor rhinitis and allergic rhinitis.  I sent in Zyrtec to your pharmacy and also Astelin nasal spray.  Your dizziness is very rare and transient.  You do not report any cardiac or other neurologic type signs symptoms associated with this.  If your dizziness is more constant would recommend that you check your sugars and make sure you not having any very high or low sugars.  Also blood pressure check would be recommended as acute blood pressure increases sometimes can cause a slight dizziness.  Also with your history of Mnire's if your dizziness becomes more persistent could refer you back to your former ENT.  Regarding the recent small bump in the groin region above tooth in the right upper region, this might subside in 2 to 3 days if this viral related.  Will get advice from your dentist.  If the area changes or worsens and dentist does not give you a clear cause or diagnosis then could also refer you to your ENT.  Follow-up date to be determined after lab review.  Estimate 3 to 46-month follow-up.  Also keep please keep appointment with your podiatrist.

## 2018-10-03 NOTE — Progress Notes (Signed)
Subjective:    Patient ID: Joshua Branch, male    DOB: March 11, 1961, 58 y.o.   MRN: [id number]  HPI  Pt in for follow up.  He is diabetic. Pt last appointment with endocrinologist upcoming. Pt states sugars in am around 125 most of time. One reading was 105. Pt starting to play Tennis 3 times a week.  Pt bp is little high initially.He check his bp about once a week.  Pt lipid panel was controlled 3 months ago.  Pt did admit drinking alcohol last night    Review of Systems  Constitutional: Negative for chills, fatigue and fever.  HENT: Negative for congestion, ear pain, sinus pain, sneezing, sore throat and tinnitus.        Very runny nose.  Respiratory: Negative for cough, chest tightness, shortness of breath and wheezing.   Cardiovascular: Negative for chest pain and palpitations.  Gastrointestinal: Negative for abdominal distention, abdominal pain and blood in stool.  Genitourinary: Negative for dysuria, flank pain, hematuria and penile pain.  Musculoskeletal: Negative for back pain, joint swelling, neck pain and neck stiffness.  Skin: Negative for rash.  Neurological: Negative for dizziness, speech difficulty, weakness and headaches.       Pt does state feeling slight off balance on and off. Not constant. He states not major. Not like he will fall down just thinks it is little off. When he stands up real quickly he notices it more.  Hematological: Negative for adenopathy. Does not bruise/bleed easily.  Psychiatric/Behavioral: Negative for behavioral problems, confusion, dysphoric mood and sleep disturbance. The patient is not nervous/anxious.      Past Medical History:  Diagnosis Date  . Acute myocardial infarction ([place]) 06/22/2018  . Allergy   . Charcot's joint of left foot, non-diabetic   . Coronary artery disease involving native coronary artery with unstable angina pectoris ([place]) 06/22/2018  . Diabetes ([place])    type 2  . Hyperlipidemia   . Hypertension   .  Neuropathy of left foot   . Obstructive sleep apnea   . Right foot ulcer ([place])   . 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 Type II diabetes mellitus with complication, uncontrolled ([place])   . Uncontrolled type 2 diabetes mellitus ([place])      Social History   Socioeconomic History  . Marital status: Married    Spouse name: [name]  . Number of children: 2  . Years of education: Not on file  . Highest education level: Professional school degree (e.g., MD, DDS, DVM, JD)  Occupational History  . Occupation: attorney  Social Needs  . Financial resource strain: Not hard at all  . Food insecurity:    Worry: Not on file    Inability: Not on file  . Transportation needs:    Medical: Not on file    Non-medical: Not on file  Tobacco Use  . Smoking status: Never Smoker  . Smokeless tobacco: Never Used  Substance and Sexual Activity  . Alcohol use: Yes    Alcohol/week: 2.0 standard drinks    Types: 2 Cans of beer per week    Comment: 2 cans of beer a month  . Drug use: Never  . Sexual activity: Not on file  Lifestyle  . Physical activity:    Days per week: Not on file    Minutes per session: Not on file  . Stress: Not  on file  Relationships  . Social connections:    Talks on phone: Not on file    Gets together: Not on file    Attends religious service: Not on file    Active member of club or organization: Not on file    Attends meetings of clubs or organizations: Not on file    Relationship status: Not on file  . Intimate partner violence:    Fear of current or ex partner: Not on file    Emotionally abused: Not on file    Physically abused: Not on file    Forced sexual activity: Not on file  Other Topics Concern  . Not on file  Social History Narrative  . Not on file    Past Surgical History:  Procedure Laterality Date  . CORONARY ARTERY BYPASS GRAFT N/A 06/25/2018   Procedure: CORONARY ARTERY BYPASS  GRAFTING (CABG) TIMES FOUR USING LEFT INTERNAL MAMMARY ARTERY AND RIGHT ENDOSCOPICALLY HARVESTED SAPHENOUS VEIN;  Surgeon: Rexene Alberts, MD;  Location: [facility];  Service: Open Heart Surgery;  Laterality: N/A;  . ENDOVEIN HARVEST OF GREATER SAPHENOUS VEIN Right 06/25/2018   Procedure: ENDOVEIN HARVEST OF GREATER SAPHENOUS VEIN;  Surgeon: Rexene Alberts, MD;  Location: [facility];  Service: Open Heart Surgery;  Laterality: Right;  . LEFT HEART CATH AND CORONARY ANGIOGRAPHY N/A 06/22/2018   Procedure: LEFT HEART CATH AND CORONARY ANGIOGRAPHY;  Surgeon: Nigel Mormon, MD;  Location: [facility];  Service: Cardiovascular;  Laterality: N/A;  . surgical debridement  Right    Right foot ulcer  . TEE WITHOUT CARDIOVERSION N/A 06/25/2018   Procedure: TRANSESOPHAGEAL ECHOCARDIOGRAM (TEE);  Surgeon: Rexene Alberts, MD;  Location: [facility];  Service: Open Heart Surgery;  Laterality: N/A;    Family History  Problem Relation Age of Onset  . Diabetes Mother   . Stroke Father   . Hypertension Father   . Hearing loss Father   . Colon polyps Father   . Colon cancer Neg Hx   . Esophageal cancer Neg Hx   . Prostate cancer Neg Hx     Allergies  Allergen Reactions  . Apricot Kernel Oil [Prunus] Swelling    THROAT  . Cherry Swelling    THROAT  . Jardiance [Empagliflozin] Diarrhea and Nausea And Vomiting  . Other Other (See Comments)    Fruits with a pit. Throat swells up. Can have them if cooked   . Peach [Prunus Persica] Swelling    THROAT  . Plum Pulp Swelling    THROAT    Current Outpatient Medications on File Prior to Visit  Medication Sig Dispense Refill  . aspirin EC 81 MG tablet Take 81 mg by mouth daily.    Marland Kitchen atorvastatin (LIPITOR) 80 MG tablet Take 1 tablet (80 mg total) by mouth daily at 6 PM. 90 tablet 3  . Blood Glucose Monitoring Suppl (FREESTYLE LITE) DEVI     . clopidogrel (PLAVIX) 75 MG tablet Take 1 tablet (75 mg total) by mouth daily. 90 tablet 3  . glucose blood  test strip Use as instructed to check 1-2x a day 100 each 12  . INVOKANA 100 MG TABS tablet TAKE 1 TABLET (100 MG TOTAL) BY MOUTH DAILY BEFORE BREAKFAST. 30 tablet 1  . Lancets MISC Check sugars 3 times a day E11.9 100 each 0  . lisinopril (PRINIVIL,ZESTRIL) 10 MG tablet Take 1 tablet (10 mg total) by mouth daily. 90 tablet 3  . metFORMIN (GLUCOPHAGE) 500 MG tablet Take  2 tablets (1,000 mg total) by mouth 2 (two) times daily with a meal. 360 tablet 3  . metoprolol tartrate (LOPRESSOR) 25 MG tablet Take 1 tablet (25 mg total) by mouth 2 (two) times daily. 180 tablet 3  . traMADol (ULTRAM) 50 MG tablet   0   No current facility-administered medications on file prior to visit.     BP (!) 141/93   Pulse 81   Temp 98.1 F (36.7 C) (Oral)   Resp 16   Ht 6\' 3"  (1.905 m)   Wt 240 lb (108.9 kg)   SpO2 97%   BMI 30.00 kg/m       Objective:   Physical Exam  General Mental Status- Alert. General Appearance- Not in acute distress.   Skin General: Color- Normal Color. Moisture- Normal Moisture.  Neck Carotid Arteries- Normal color. Moisture- Normal Moisture. No carotid bruits. No JVD.  Chest and Lung Exam Auscultation: Breath Sounds:-Normal.  Cardiovascular Auscultation:Rythm- Regular. Murmurs & Other Heart Sounds:Auscultation of the heart reveals- No Murmurs.  Abdomen Inspection:-Inspeection Normal. Palpation/Percussion:Note:No mass. Palpation and Percussion of the abdomen reveal- Non Tender, Non Distended + BS, no rebound or guarding.   Neurologic Cranial Nerve exam:- CN III-XII intact(No nystagmus), symmetric smile. Drift Test:- No drift. Finger to Nose:- Normal/Intact Strength:- 5/5 equal and symmetric strength both upper and lower extremities.  Rt foot- second toe small area of break down epidermis to toe. 1st metatrsal head area callous.   Left foot- 3rd metatarsal head callous appearance.      Assessment & Plan:  For your diabetes, recommend continue low sugar  diet, exercise and medication your endocrinologist as prescribed.  For history of hyperlipidemia and coronary artery disease, recommend continue high-dose statin and will place future lab to check a lipid panel and metabolic panel.  States she drank alcohol last night will delay getting labs to get accurate measure of your liver enzymes.  No alcohol over the next week as that can raise liver enzymes.  Your blood pressure is under good control when I rechecked it today.  130/80 is good level for diabetic patients.  Do recommend checking your blood pressure 2-3 times a week may be middle a day would be better to get accurate measure.  You do report spring allergies and very runny nose.  You might have combination of vasomotor rhinitis and allergic rhinitis.  I sent in Zyrtec to your pharmacy and also Astelin nasal spray.  Your dizziness is very rare and transient.  You do not report any cardiac or other neurologic type signs symptoms associated with this.  If your dizziness is more constant would recommend that you check your sugars and make sure you not having any very high or low sugars.  Also blood pressure check would be recommended as acute blood pressure increases sometimes can cause a slight dizziness.  Also with your history of Mnire's if your dizziness becomes more persistent could refer you back to your former ENT.  Regarding the recent small bump in the groin region above tooth in the right upper region, this might subside in 2 to 3 days if this viral related.  Will get advice from your dentist.  If the area changes or worsens and dentist does not give you a clear cause or diagnosis then could also refer you to your ENT.  Follow-up date to be determined after lab review.  Estimate 3 to 16-month follow-up.  Also keep please keep appointment with your podiatrist.  40 minute spent with pt.  50% of time spent counseling on his chronic medical problems as well as regarding new runny nose, mild  dizziness and small new lump inside gums.

## 2018-10-07 ENCOUNTER — Ambulatory Visit: Payer: 59 | Admitting: Sports Medicine

## 2018-10-13 MED FILL — LISINOPRIL 10 MG TABLET: 10 | 90 days supply | Qty: 90 | Fill #1

## 2018-10-13 MED FILL — metFORMIN HCL 500 MG TABS: 500 | 90 days supply | Qty: 360 | Fill #1

## 2018-10-31 ENCOUNTER — Ambulatory Visit (INDEPENDENT_AMBULATORY_CARE_PROVIDER_SITE_OTHER): Payer: 59 | Admitting: Internal Medicine

## 2018-10-31 ENCOUNTER — Encounter: Payer: Self-pay | Admitting: Internal Medicine

## 2018-10-31 ENCOUNTER — Other Ambulatory Visit: Payer: Self-pay

## 2018-10-31 ENCOUNTER — Encounter: Payer: Self-pay | Admitting: Medical

## 2018-10-31 DIAGNOSIS — E1165 Type 2 diabetes mellitus with hyperglycemia: Secondary | ICD-10-CM

## 2018-10-31 DIAGNOSIS — E1159 Type 2 diabetes mellitus with other circulatory complications: Secondary | ICD-10-CM

## 2018-10-31 DIAGNOSIS — E785 Hyperlipidemia, unspecified: Secondary | ICD-10-CM

## 2018-10-31 DIAGNOSIS — R200 Anesthesia of skin: Secondary | ICD-10-CM

## 2018-10-31 MED ORDER — CANAGLIFLOZIN 100 MG PO TABS: ORAL_TABLET | ORAL | 3 refills | Status: DC

## 2018-10-31 MED FILL — INVOKANA 100 MG TABLET: 100 | 90 days supply | Qty: 90 | Fill #0

## 2018-10-31 NOTE — Progress Notes (Signed)
Patient ID: [name], male   DOB: 06-25-1961, 58 y.o.   MRN: [id number]   Patient location: Home My location: Office  Referring Provider: Mackie Pai, PA-C  I connected with the patient on 10/31/18 at  8:00 AM EDT by a video enabled telemedicine application and verified that I am speaking with the correct person.   I discussed the limitations of evaluation and management by telemedicine and the availability of in person appointments. The patient expressed understanding and agreed to proceed.   Details of the encounter are shown below.  HPI: Joshua Branch is a 58 y.o.-year-old male, presentingfor follow-up for DM2, dx before 2017, non-insulin-dependent, uncontrolled, with long-term complications (CAD - s/p AMI - s/p CABG x4 06/2018, nonhealing skin ulcer).  Last visit 3.5 months ago.  He was referred to endocrinology after he had an acute MI in 05/2018.  He had to have CABG x4.  Reviewed latest HbA1c level: Lab Results  Component Value Date   HGBA1C 8.2 (H) 06/23/2018   HGBA1C 11.7 (H) 01/17/2018   HGBA1C 10.8 (H) 12/07/2016   HGBA1C 9.2 (H) 11/30/2015   He started to change his diet in 02/2018: Eliminated fast foods, less sweets, less alcohol >> sugars improved significantly.  He is on: - Metformin 1000 mg 2x a day, with meals - started 12/2017, increased 06/2018 - Jardiance 10 mg in a.m.  >> abd. Pain, nausea, diarrhea >> changed to Invokana 100 mg in am - tolerates this well  Pt checks his sugars once a day: - am: 155-165 >> 102-135 - 2h after b'fast: 175-185, 200 >> 130-160 - before lunch:  - 2h after lunch: n/c - before dinner: n/c - 2h after dinner: 180s >> 130-160 - bedtime: n/c - nighttime: n/c Lowest sugar was 115-120 >>102; it is unclear at which level he has hypoglycemia awareness Highest sugar was 204 >> 185.  Glucometer: True Metrix  Pt's meals are: - Breakfast: protein shake, smoothie, yoghurt, occas. Eggs and bacon - Lunch: sandwich, chips -  Dinner: chicken, veggie - Snacks: 3 a day  -No CKD, last BUN/creatinine:  Lab Results  Component Value Date   BUN 18 06/28/2018   BUN 16 06/27/2018   CREATININE 0.91 06/28/2018   CREATININE 0.81 06/27/2018  On lisinopril 10.  -+ HL; last set of lipids: Lab Results  Component Value Date   CHOL 120 06/23/2018   HDL 32 (L) 06/23/2018   LDLCALC 69 06/23/2018   LDLDIRECT 93.0 01/17/2018   TRIG 94 06/23/2018   CHOLHDL 3.8 06/23/2018  On Lipitor 80.  - last eye exam was in Summer 2019: No DR, but he has hypertensive retinopathy. Joshua [facility].  - + numbness and tingling in his feet, but also numbness in his fingertips  On ASA 81.  Pt has FH of DM in mother, sister.  He has numbness in the tips of his fingers. We checked a vitamin B12 level at last visit and this was normal: Lab Results  Component Value Date   VITAMINB12 642 07/15/2018    ROS: Constitutional: no weight gain/+ weight loss, no fatigue, no subjective hyperthermia, no subjective hypothermia Eyes: no blurry vision, no xerophthalmia ENT: no sore throat, no nodules palpated in neck, no dysphagia, no odynophagia, no hoarseness Cardiovascular: no CP/no SOB/no palpitations/no leg swelling Respiratory: no cough/no SOB/no wheezing Gastrointestinal: no N/no V/no D/no C/no acid reflux Musculoskeletal: no muscle aches/no joint aches Skin: no rashes, no hair loss Neurological: no tremors/+ numbness - tips of fingers/+  tingling/no dizziness  I reviewed pt's medications, allergies, PMH, social hx, family hx, and changes were documented in the history of present illness. Otherwise, unchanged from my initial visit note.  Past Medical History:  Diagnosis Date  . Acute myocardial infarction ([place]) 06/22/2018  . Allergy   . Charcot's joint of left foot, non-diabetic   . Coronary artery disease involving native coronary artery with unstable angina pectoris ([place]) 06/22/2018  . Diabetes ([place])    type 2  .  Hyperlipidemia   . Hypertension   . Neuropathy of left foot   . Obstructive sleep apnea   . Right foot ulcer ([place])   . 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 Type II diabetes mellitus with complication, uncontrolled ([place])   . Uncontrolled type 2 diabetes mellitus ([place])    Past Surgical History:  Procedure Laterality Date  . CORONARY ARTERY BYPASS GRAFT N/A 06/25/2018   Procedure: CORONARY ARTERY BYPASS GRAFTING (CABG) TIMES FOUR USING LEFT INTERNAL MAMMARY ARTERY AND RIGHT ENDOSCOPICALLY HARVESTED SAPHENOUS VEIN;  Surgeon: Rexene Alberts, MD;  Location: [place];  Service: Open Heart Surgery;  Laterality: N/A;  . ENDOVEIN HARVEST OF GREATER SAPHENOUS VEIN Right 06/25/2018   Procedure: ENDOVEIN HARVEST OF GREATER SAPHENOUS VEIN;  Surgeon: Rexene Alberts, MD;  Location: [place];  Service: Open Heart Surgery;  Laterality: Right;  . LEFT HEART CATH AND CORONARY ANGIOGRAPHY N/A 06/22/2018   Procedure: LEFT HEART CATH AND CORONARY ANGIOGRAPHY;  Surgeon: Nigel Mormon, MD;  Location: [facility];  Service: Cardiovascular;  Laterality: N/A;  . surgical debridement  Right    Right foot ulcer  . TEE WITHOUT CARDIOVERSION N/A 06/25/2018   Procedure: TRANSESOPHAGEAL ECHOCARDIOGRAM (TEE);  Surgeon: Rexene Alberts, MD;  Location: [place] [facility];  Service: Open Heart Surgery;  Laterality: N/A;   Social History   Socioeconomic History  . Marital status: Married    Spouse name: Joshua Branch  . Number of children: 2  . Years of education: Not on file  . Highest education level: Professional school degree (e.g., MD, DDS, DVM, JD)  Occupational History  . Occupation: attorney  Social Needs  . Financial resource strain: Not hard at all  . Food insecurity:    Worry: Not on file    Inability: Not on file  . Transportation needs:    Medical: Not on file    Non-medical: Not on file  Tobacco Use  . Smoking status: Never Smoker  .  Smokeless tobacco: Never Used  Substance and Sexual Activity  . Alcohol use: Yes    Alcohol/week: 2.0 standard drinks    Types: 2 Cans of beer per week    Comment: 2 cans of beer a month  . Drug use: Never  . Sexual activity: Not on file  Lifestyle  . Physical activity:    Days per week: Not on file    Minutes per session: Not on file  . Stress: Not on file  Relationships  . Social connections:    Talks on phone: Not on file    Gets together: Not on file    Attends religious service: Not on file    Active member of club or organization: Not on file    Attends meetings of clubs or organizations: Not on file    Relationship status: Not on file  . Intimate partner violence:    Fear of  current or ex partner: Not on file    Emotionally abused: Not on file    Physically abused: Not on file    Forced sexual activity: Not on file  Other Topics Concern  . Not on file  Social History Narrative  . Not on file   Current Outpatient Medications on File Prior to Visit  Medication Sig Dispense Refill  . aspirin EC 81 MG tablet Take 81 mg by mouth daily.    Marland Kitchen atorvastatin (LIPITOR) 80 MG tablet Take 1 tablet (80 mg total) by mouth daily at 6 PM. 90 tablet 3  . azelastine (ASTELIN) 0.1 % nasal spray Place 2 sprays into both nostrils 2 (two) times daily. Use in each nostril as directed 30 mL 1  . Blood Glucose Monitoring Suppl (FREESTYLE LITE) DEVI     . cetirizine (ZYRTEC) 10 MG tablet Take 1 tablet (10 mg total) by mouth daily. 30 tablet 11  . clopidogrel (PLAVIX) 75 MG tablet Take 1 tablet (75 mg total) by mouth daily. 90 tablet 3  . glucose blood test strip Use as instructed to check 1-2x a day 100 each 12  . INVOKANA 100 MG TABS tablet TAKE 1 TABLET (100 MG TOTAL) BY MOUTH DAILY BEFORE BREAKFAST. 30 tablet 1  . Lancets MISC Check sugars 3 times a day E11.9 100 each 0  . lisinopril (PRINIVIL,ZESTRIL) 10 MG tablet Take 1 tablet (10 mg total) by mouth daily. 90 tablet 3  . metFORMIN  (GLUCOPHAGE) 500 MG tablet Take 2 tablets (1,000 mg total) by mouth 2 (two) times daily with a meal. 360 tablet 3  . metoprolol tartrate (LOPRESSOR) 25 MG tablet Take 1 tablet (25 mg total) by mouth 2 (two) times daily. 180 tablet 3  . traMADol (ULTRAM) 50 MG tablet   0   No current facility-administered medications on file prior to visit.    Allergies  Allergen Reactions  . Apricot Kernel Oil [Prunus] Swelling    THROAT  . Cherry Swelling    THROAT  . Jardiance [Empagliflozin] Diarrhea and Nausea And Vomiting  . Other Other (See Comments)    Fruits with a pit. Throat swells up. Can have them if cooked   . Peach [Prunus Persica] Swelling    THROAT  . Plum Pulp Swelling    THROAT   Family History  Problem Relation Age of Onset  . Diabetes Mother   . Stroke Father   . Hypertension Father   . Hearing loss Father   . Colon polyps Father   . Colon cancer Neg Hx   . Esophageal cancer Neg Hx   . Prostate cancer Neg Hx    PE: There were no vitals taken for this visit. Wt Readings from Last 3 Encounters:  10/03/18 240 lb (108.9 kg)  07/28/18 241 lb (109.3 kg)  07/16/18 235 lb (106.6 kg)   Constitutional:  in NAD  The physical exam was not performed (virtual visit).  ASSESSMENT: 1. DM2, non-insulin-dependent, uncontrolled, with long-term complications - CAD - s/p AMI - s/p CABG x4 06/2018-sees Dr. Gwenlyn Found - nonhealing skin ulcer  2.  Numbness in fingers  3. HL  PLAN:  1. Patient with history of longstanding, uncontrolled, type 2 diabetes, on oral antidiabetic regimen, with metformin only at last visit which we tried to add an SGLT 2 inhibitor.  He came to see me approximately 4 months ago after he had an MI for which he had to have open heart surgery.  At that time, we tried  to start Jardiance 10 mg daily and stopped his daily HCTZ 12.5 mg.  However, he developed abdominal pain from [place] so we decided to try Invokana.  He started Invokana and he tolerates this well,  without any GI symptoms. -He started to change his diet towards the end of last year by cutting down fast foods, sweets, and alcohol.  Sugars improved significantly and his HbA1c also improved from 11.7 to 8.2%. -At this visit, he relates much improved blood sugars, now almost all at goal.  He also feels well, with good energy, and lost several pounds. -He is interested in reducing his medication doses and we discussed that at next visit we may be able to decrease metformin dose.  I suggested to continue Invokana based on its effect on the cardiovascular risk - I suggested to:  Patient Instructions  Please continue: - Metformin 1000 mg 2x a day - Invokana 100 mg before breakfast  Please return in 3-4 months with your sugar log.   - will check HbA1c when he returns to the clinic  - continue checking sugars at different times of the day - check 1x a day, rotating checks - advised for yearly eye exams >> he is UTD - Return to clinic in 3-4 mo with sugar log   2.  Numbness in fingertips - B12 level normal at last OV - most likely 2/2 DM  3. HL - Reviewed latest lipid panel from 05/2018: at goal except low HDL Lab Results  Component Value Date   CHOL 120 06/23/2018   HDL 32 (L) 06/23/2018   LDLCALC 69 06/23/2018   LDLDIRECT 93.0 01/17/2018   TRIG 94 06/23/2018   CHOLHDL 3.8 06/23/2018  - Continues high-dose Lipitor without side effects.   Philemon Kingdom, MD PhD [facility] Endocrinology

## 2018-10-31 NOTE — Patient Instructions (Addendum)
Please continue: - Metformin 1000 mg 2x a day - Invokana 100 mg before breakfast  Please return in 3-4 months with your sugar log.

## 2018-11-11 ENCOUNTER — Encounter: Payer: Self-pay | Admitting: Sports Medicine

## 2018-11-11 ENCOUNTER — Ambulatory Visit (INDEPENDENT_AMBULATORY_CARE_PROVIDER_SITE_OTHER): Payer: 59 | Admitting: Sports Medicine

## 2018-11-11 ENCOUNTER — Other Ambulatory Visit: Payer: Self-pay

## 2018-11-11 VITALS — Temp 97.7°F

## 2018-11-11 DIAGNOSIS — L97921 Non-pressure chronic ulcer of unspecified part of left lower leg limited to breakdown of skin: Secondary | ICD-10-CM

## 2018-11-11 DIAGNOSIS — E1142 Type 2 diabetes mellitus with diabetic polyneuropathy: Secondary | ICD-10-CM

## 2018-11-11 DIAGNOSIS — L02612 Cutaneous abscess of left foot: Secondary | ICD-10-CM | POA: Diagnosis not present

## 2018-11-11 DIAGNOSIS — L97911 Non-pressure chronic ulcer of unspecified part of right lower leg limited to breakdown of skin: Secondary | ICD-10-CM | POA: Diagnosis not present

## 2018-11-11 DIAGNOSIS — E11610 Type 2 diabetes mellitus with diabetic neuropathic arthropathy: Secondary | ICD-10-CM | POA: Diagnosis not present

## 2018-11-11 DIAGNOSIS — L03032 Cellulitis of left toe: Secondary | ICD-10-CM

## 2018-11-11 MED ORDER — SULFAMETHOXAZOLE-TRIMETHOPRIM 400-80 MG PO TABS: 1.0000 | ORAL_TABLET | Freq: Two times a day (BID) | ORAL | 0 refills | Status: DC

## 2018-11-11 MED FILL — SULFAMETHOXAZOLE-TMP SS TAB: 400-80 | 14 days supply | Qty: 28 | Fill #0

## 2018-11-11 NOTE — Progress Notes (Signed)
Subjective: Joshua Branch is a 58 y.o. male patient seen in office for follow up evaluation of bilateral foot ulcers.  Patient has a history of diabetes and a blood glucose level today not recorded. Patient is changing the dressing using padding and reports that there is drainage that started since the last few days from the 2nd toe on the left with a lot of skin that he pulled off. No other issues noted. Denies nausea/fever/vomiting/chills/night sweats/shortness of breath/pain.  FBS not recorded.   Patient Active Problem List   Diagnosis Date Noted  . Poorly controlled type 2 diabetes mellitus with circulatory disorder ([place]) 07/15/2018  . Numbness in both hands 07/15/2018  . 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 Acute coronary syndrome ([place]) 06/22/2018  . Nonhealing skin ulcer ([place]) 06/22/2018  . Coronary artery disease involving native coronary artery with unstable angina pectoris ([place]) 06/22/2018  . Acute myocardial infarction ([place]) 06/22/2018  . Hyperlipidemia   . 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 Vertigo 08/20/2016  . Myringotomy tube status 08/20/2016  . Ear fullness, left 08/20/2016  . Asymmetrical left sensorineural hearing loss 02/06/2016  . Neuropathy of left foot 02/03/2015  . Charcot's joint of left foot, non-diabetic 02/03/2015  . 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 Cramping of hands 06/02/2014  . Eustachian tube dysfunction 06/02/2014  . HTN (hypertension) 05/07/2014  . Obstructive sleep apnea 05/07/2014   Current Outpatient Medications on File Prior to Visit  Medication Sig Dispense Refill  . aspirin EC 81 MG tablet Take 81 mg by mouth daily.    Marland Kitchen atorvastatin (LIPITOR) 80 MG tablet Take 1 tablet (80 mg total) by mouth daily at 6 PM. 90 tablet 3  . azelastine (ASTELIN) 0.1 % nasal spray Place 2 sprays into both nostrils 2 (two) times daily. Use in each nostril as directed 30 mL 1  . Blood Glucose Monitoring Suppl (FREESTYLE LITE) DEVI     .  canagliflozin (INVOKANA) 100 MG TABS tablet TAKE 1 TABLET (100 MG TOTAL) BY MOUTH DAILY BEFORE BREAKFAST. 90 tablet 3  . cetirizine (ZYRTEC) 10 MG tablet Take 1 tablet (10 mg total) by mouth daily. 30 tablet 11  . clopidogrel (PLAVIX) 75 MG tablet Take 1 tablet (75 mg total) by mouth daily. 90 tablet 3  . glucose blood test strip Use as instructed to check 1-2x a day 100 each 12  . Lancets MISC Check sugars 3 times a day E11.9 100 each 0  . lisinopril (PRINIVIL,ZESTRIL) 10 MG tablet Take 1 tablet (10 mg total) by mouth daily. 90 tablet 3  . metFORMIN (GLUCOPHAGE) 500 MG tablet Take 2 tablets (1,000 mg total) by mouth 2 (two) times daily with a meal. 360 tablet 3  . metoprolol tartrate (LOPRESSOR) 25 MG tablet Take 1 tablet (25 mg total) by mouth 2 (two) times daily. 180 tablet 3  . traMADol (ULTRAM) 50 MG tablet   0   No current facility-administered medications on file prior to visit.    Allergies  Allergen Reactions  . Apricot Kernel Oil [Prunus] Swelling    THROAT  . Cherry Swelling    THROAT  . Jardiance [Empagliflozin] Diarrhea and Nausea And Vomiting  . Other Other (See Comments)    Fruits with a pit. Throat swells up. Can have them if cooked   . Peach [Prunus Persica] Swelling    THROAT  . Plum Pulp Swelling    THROAT     Objective: There were no vitals filed for  this visit.  General: Patient is awake, alert, oriented x 3 and in no acute distress.  Dermatology: Skin is warm and dry bilateral with prematurely healed ulcers right plantar forefoot with dry heme and submet 1 healed ulceration on the left with dry heme. No other acute signs of infection.   At  left second toe there is a full thickness ulceration that measures now 0.4x0.2x0.2cm that probes to soft tissue end range with bloody drainage and swelling and localized redness to distal tuft.    Vascular: Dorsalis Pedis pulse = 2/4 Bilateral,  Posterior Tibial pulse = 1/4 Bilateral,  Capillary Fill Time < 5  seconds  Neurologic: Protective sensation absent bilateral.  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 No Pain with palpation to ulcerated area at left 2nd toe. No pain with compression to calves bilateral.   No results for input(s): GRAMSTAIN, LABORGA in the last 8760 hours.  Assessment and Plan:  Problem List Items Addressed This Visit    None    Visit Diagnoses    Ulcers of both lower extremities, limited to breakdown of skin ([place])    -  Primary   Diabetic polyneuropathy associated with type 2 diabetes mellitus ([place])       Charcot foot due to diabetes mellitus ([place])         -Examined patient and discussed the progression of the wound and treatment alternatives. - Excisionally dedbrided ulceration at left second toe to healthy bleeding borders removing nonviable tissue using a sterile chisel blade. Wound measures post debridement as above.  Wounds were debrided to the level of the dermis with viable wound base exposed to promote healing. Hemostasis was achieved with manuel pressure. Patient tolerated procedure well without any discomfort or anesthesia necessary for this wound debridement.  -Wound culture obtained -Sent Bactrim to pharmacy and will call if change in therapy is needed  -Continue with offloading pads plantar forefoot bil -Continue post op shoes with offloading padding as previous however patient continues to come to office with dress shoes on; Advised patient to stop wearing these shoes and to change activity to prevent rubbing to toes - Advised patient to go to the ER or return to office if the wound worsens or if constitutional symptoms are present. -Patient to return to office in 2 weeks for x-rays to r/o OM at 2nd toe and wound check or sooner if problems arise.  Landis Martins, DPM

## 2018-11-14 ENCOUNTER — Telehealth: Payer: Self-pay | Admitting: *Deleted

## 2018-11-14 NOTE — Telephone Encounter (Signed)
Left message on pt's home phone informing pt of Dr. Leeanne Rio review of results and orders.

## 2018-11-14 NOTE — Telephone Encounter (Signed)
-----   Message from Joshua Branch, [place] sent at 11/13/2018  1:07 PM EDT ----- Let patient know that wound culture was + for Strept. He should continue with bactrim antibiotics I have already prescribed until I recheck him next office visit Thanks Dr. Chauncey Cruel

## 2018-11-15 LAB — WOUND CULTURE
MICRO NUMBER:: 394593
SPECIMEN QUALITY:: ADEQUATE

## 2018-11-17 ENCOUNTER — Telehealth: Payer: Self-pay | Admitting: Cardiovascular Disease

## 2018-11-17 NOTE — Telephone Encounter (Signed)
Called patient to schedule appointment, but, he states he does not know if he needs one or if it can be done over the phone.  He has been having fatigue and is not sure which kind of appointment he needs.

## 2018-11-17 NOTE — Telephone Encounter (Signed)
Pt denies cardiac questionnaire Sx and states only complaint is fatigue. Appt set for 4/24 at 11:45am. Pt verbalized understanding     Cardiac Questionnaire:    Since your last visit or hospitalization:    1. Have you been having new or worsening chest pain? no   2. Have you been having new or worsening shortness of breath? no 3. Have you been having new or worsening leg swelling, wt gain, or increase in abdominal girth (pants fitting more tightly)? no   4. Have you had any passing out spells? no    *A YES to any of these questions would result in the appointment being kept. *If all the answers to these questions are NO, we should indicate that given the current situation regarding the worldwide coronarvirus pandemic, at the recommendation of the CDC, we are looking to limit gatherings in our waiting area, and thus will reschedule their appointment beyond four weeks from today.   _____________   FAOZH-08 Pre-Screening Questions:   Do you currently have a fever?  (yes = cancel and refer to pcp for e-visit)  Have you recently travelled on a cruise, internationally, or to [place], [place], [place], [place], or [place], [place]) ? no (yes = cancel, stay home, monitor symptoms, and contact pcp or initiate e-visit if symptoms develop)  Have you been in contact with someone that is currently pending confirmation of Covid19 testing or has been confirmed to have the [place] virus? no (yes = cancel, stay home, away from tested individual, monitor symptoms, and contact pcp or initiate e-visit if symptoms develop)  Are you currently experiencing fatigue or cough? No; fatigue largely related to activity (yes = pt should be prepared to have a mask placed at the time of their visit).        Virtual Visit Pre-Appointment Phone Call  Steps For Call:  1. Confirm consent - "In the setting of the current Covid19 crisis, you are scheduled for a (phone or video) visit with your provider on (date) at  (time).  Just as we do with many in-office visits, in order for you to participate in this visit, we must obtain consent.  If you'd like, I can send this to your mychart (if signed up) or email for you to review.  Otherwise, I can obtain your verbal consent now.  All virtual visits are billed to your insurance company just like a normal visit would be.  By agreeing to a virtual visit, we'd like you to understand that the technology does not allow for your provider to perform an examination, and thus may limit your provider's ability to fully assess your condition. If your provider identifies any concerns that need to be evaluated in person, we will make arrangements to do so.  Finally, though the technology is pretty good, we cannot assure that it will always work on either your or our end, and in the setting of a video visit, we may have to convert it to a phone-only visit.  In either situation, we cannot ensure that we have a secure connection.  Are you willing to proceed?" STAFF: Did the patient verbally acknowledge consent to telehealth visit? Document YES/NO here: yes  2. Confirm the BEST phone number to call the day of the visit by including in appointment notes  3. Give patient instructions for WebEx/MyChart download to smartphone as below or Doximity/Doxy.me if video visit (depending on what platform provider is using)  4. Advise patient to be prepared with their blood  pressure, heart rate, weight, any heart rhythm information, their current medicines, and a piece of paper and pen handy for any instructions they may receive the day of their visit  5. Inform patient they will receive a phone call 15 minutes prior to their appointment time (may be from unknown caller ID) so they should be prepared to answer  6. Confirm that appointment type is correct in Epic appointment notes (VIDEO vs PHONE)     TELEPHONE CALL NOTE  KIRKLAND FIGG has been deemed a candidate for a follow-up tele-health  visit to limit community exposure during the Covid-19 pandemic. I spoke with the patient via phone to ensure availability of phone/video source, confirm preferred email & phone number, and discuss instructions and expectations.  I reminded CORBY VANDENBERGHE to be prepared with any vital sign and/or heart rhythm information that could potentially be obtained via home monitoring, at the time of his visit. I reminded COOLIDGE GOSSARD to expect a phone call at the time of his visit if his visit.  Cynthis Purington Dawna Part, RN 11/17/2018 2:08 PM   INSTRUCTIONS FOR DOWNLOADING THE [place] APP TO SMARTPHONE  - If Apple, ask patient to go to App Store and type in WebEx in the search bar. [employer], the blue/green circle. If Android, go to [employer] and type in [employer] in the search bar. The app is free but as with any other app downloads, their phone may require them to verify saved payment information or Apple/Android password.  - The patient does NOT have to create an account. - On the day of the visit, the assist will walk the patient through joining the meeting with the meeting number/password.  INSTRUCTIONS FOR DOWNLOADING THE MYCHART APP TO SMARTPHONE  - The patient must first make sure to have activated MyChart and know their login information - If Apple, go to [employer] and type in MyChart in the search bar and download the app. If Android, ask patient to go to [employer] and type in [place] in the search bar and download the app. The app is free but as with any other app downloads, their phone may require them to verify saved payment information or Apple/Android password.  - The patient will need to then log into the app with their MyChart username and password, and select [facility] as their healthcare provider to link the account. When it is time for your visit, go to the MyChart app, find appointments, and click Begin Video Visit. Be sure to Select Allow for your device to  access the Microphone and Camera for your visit. You will then be connected, and your provider will be with you shortly.  **If they have any issues connecting, or need assistance please contact MyChart service desk [phone])**  **If using a computer, in order to ensure the best quality for their visit they will need to use either of the following Internet Browsers: [employer], or Google Chrome**  IF USING DOXIMITY or DOXY.ME - The patient will receive a link just prior to their visit, either by text or email (to be determined day of appointment depending on if it's doxy.me or Doximity).     FULL LENGTH CONSENT FOR TELE-HEALTH VISIT   I hereby voluntarily request, consent and authorize [facility] and its employed or contracted physicians, physician assistants, nurse practitioners or other licensed health care professionals (the Practitioner), to provide me with telemedicine health care services (the Services") as deemed  necessary by the treating Practitioner. I acknowledge and consent to receive the Services by the Practitioner via telemedicine. I understand that the telemedicine visit will involve communicating with the Practitioner through live audiovisual communication technology and the disclosure of certain medical information by electronic transmission. I acknowledge that I have been given the opportunity to request an in-person assessment or other available alternative prior to the telemedicine visit and am voluntarily participating in the telemedicine visit.  I understand that I have the right to withhold or withdraw my consent to the use of telemedicine in the course of my care at any time, without affecting my right to future care or treatment, and that the Practitioner or I may terminate the telemedicine visit at any time. I understand that I have the right to inspect all information obtained and/or recorded in the course of the telemedicine visit and may receive  copies of available information for a reasonable fee.  I understand that some of the potential risks of receiving the Services via telemedicine include:   Delay or interruption in medical evaluation due to technological equipment failure or disruption;  Information transmitted may not be sufficient (e.g. poor resolution of images) to allow for appropriate medical decision making by the Practitioner; and/or   In rare instances, security protocols could fail, causing a breach of personal health information.  Furthermore, I acknowledge that it is my responsibility to provide information about my medical history, conditions and care that is complete and accurate to the best of my ability. I acknowledge that Practitioner's advice, recommendations, and/or decision may be based on factors not within their control, such as incomplete or inaccurate data provided by me or distortions of diagnostic images or specimens that may result from electronic transmissions. I understand that the practice of medicine is not an exact science and that Practitioner makes no warranties or guarantees regarding treatment outcomes. I acknowledge that I will receive a copy of this consent concurrently upon execution via email to the email address I last provided but may also request a printed copy by calling the office of [facility].    I understand that my insurance will be billed for this visit.   I have read or had this consent read to me.  I understand the contents of this consent, which adequately explains the benefits and risks of the Services being provided via telemedicine.   I have been provided ample opportunity to ask questions regarding this consent and the Services and have had my questions answered to my satisfaction.  I give my informed consent for the services to be provided through the use of telemedicine in my medical care  By participating in this telemedicine visit I agree to the above.

## 2018-11-18 ENCOUNTER — Telehealth: Payer: Self-pay | Admitting: *Deleted

## 2018-11-18 NOTE — Telephone Encounter (Signed)
-----   Message from Landis Martins, [place] sent at 11/18/2018  1:37 PM EDT ----- Wound culture +. Continue with Bactrim until finished Thanks Dr. Chauncey Cruel

## 2018-11-18 NOTE — Telephone Encounter (Signed)
Informed pt of Dr. Leeanne Rio review of results and orders. Pt states understanding and I reminded pt of the 11/25/2018 8:30am appt.

## 2018-11-19 ENCOUNTER — Telehealth: Payer: Self-pay | Admitting: *Deleted

## 2018-11-19 ENCOUNTER — Encounter: Payer: Self-pay | Admitting: *Deleted

## 2018-11-19 NOTE — Telephone Encounter (Signed)
Spoke with patient and he agreed to have a video visit with Dr. Gwenlyn Found on November 21, 2018 at 12:00pm. We reviewed patient's medications, allergies, pharmacy, and history.

## 2018-11-20 ENCOUNTER — Telehealth: Payer: Self-pay | Admitting: Cardiovascular Disease

## 2018-11-20 NOTE — Telephone Encounter (Signed)
Mychart, smartphone, pre reg complete 11/20/18 AF

## 2018-11-21 ENCOUNTER — Telehealth: Payer: Self-pay

## 2018-11-21 ENCOUNTER — Telehealth (INDEPENDENT_AMBULATORY_CARE_PROVIDER_SITE_OTHER): Payer: 59 | Admitting: Cardiovascular Disease

## 2018-11-21 DIAGNOSIS — I214 Non-ST elevation (NSTEMI) myocardial infarction: Secondary | ICD-10-CM | POA: Diagnosis not present

## 2018-11-21 DIAGNOSIS — E782 Mixed hyperlipidemia: Secondary | ICD-10-CM | POA: Diagnosis not present

## 2018-11-21 DIAGNOSIS — I119 Hypertensive heart disease without heart failure: Secondary | ICD-10-CM | POA: Diagnosis not present

## 2018-11-21 DIAGNOSIS — I25110 Atherosclerotic heart disease of native coronary artery with unstable angina pectoris: Secondary | ICD-10-CM | POA: Diagnosis not present

## 2018-11-21 DIAGNOSIS — I249 Acute ischemic heart disease, unspecified: Secondary | ICD-10-CM

## 2018-11-21 DIAGNOSIS — Z951 Presence of aortocoronary bypass graft: Secondary | ICD-10-CM | POA: Diagnosis not present

## 2018-11-21 DIAGNOSIS — I10 Essential (primary) hypertension: Secondary | ICD-10-CM

## 2018-11-21 NOTE — Patient Instructions (Signed)

## 2018-11-21 NOTE — Progress Notes (Signed)
Virtual Visit via Video Note   This visit type was conducted due to national recommendations for restrictions regarding the COVID-19 Pandemic (e.g. social distancing) in an effort to limit this patient's exposure and mitigate transmission in our community.  Due to his co-morbid illnesses, this patient is at least at moderate risk for complications without adequate follow up.  This format is felt to be most appropriate for this patient at this time.  All issues noted in this document were discussed and addressed.  A limited physical exam was performed with this format.  Please refer to the patient's chart for his consent to telehealth for [facility].   Evaluation Performed:  Follow-up visit  Date:  11/21/2018   ID:  Joshua Branch, DOB 09-06-1960, MRN [id number]  Patient Location: Home Provider Location: Home  PCP:  Mackie Pai, PA-C  Cardiologist: Dr. Quay Burow Electrophysiologist:  None   Chief Complaint: Follow-up CAD post CABG  History of Present Illness:    Joshua Branch is a 58 y.o. moderately overweight married Caucasian male father of 2 children who is self-referred to be established in my practice for ongoing cardiovascular care.  I last saw him in the office 07/16/2018. He is a Optician, dispensing.  His wife is an occupational therapist at [employer].  His primary provider is Perlie Mayo PA-C in [place].  His risk factors include treated diabetes, hypertension and hyperlipidemia his mother did have a myocardial infarction.  He presented on 06/22/2018 with non-STEMI and cath performed radially by Dr. Randa Lynn revealed three-vessel disease.  His EF was normal though he did have apical wall motion abnormality.  3 days later he underwent coronary artery bypass grafting x4 by Dr. Roxy Manns with an excellent result.  He recuperated nicely.  Since I saw him in the office 4 months ago he is done well.  Unfortunately, he was not able to participate in cardiac rehab  because of diabetic foot ulcers which have since healed.  His major complaint is fatigue although this is much better than he was feeling pre-bypass surgery.  He was checked for sleep apnea which was negative.  He is on a high-dose atorvastatin and is going to get his lipid profile checked by his PCP since when last checked 01/16/2018 his LDL was 93.  He denies chest pain or shortness of breath.  He does complain of some erectile dysfunction and I have told him that there is no contraindication to using Viagra.  The patient does not have symptoms concerning for COVID-19 infection (fever, chills, cough, or new shortness of breath).    Past Medical History:  Diagnosis Date  . Acute myocardial infarction ([place]) 06/22/2018  . Allergy   . Charcot's joint of left foot, non-diabetic   . Coronary artery disease involving native coronary artery with unstable angina pectoris ([place]) 06/22/2018  . Diabetes ([place])    type 2  . Hyperlipidemia   . Hypertension   . Neuropathy of left foot   . Obstructive sleep apnea   . Right foot ulcer ([place])   . 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 Type II diabetes mellitus with complication, uncontrolled ([place])   . Uncontrolled type 2 diabetes mellitus ([place])    Past Surgical History:  Procedure Laterality Date  . CORONARY ARTERY BYPASS GRAFT N/A 06/25/2018   Procedure: CORONARY ARTERY BYPASS GRAFTING (CABG) TIMES FOUR USING  LEFT INTERNAL MAMMARY ARTERY AND RIGHT ENDOSCOPICALLY HARVESTED SAPHENOUS VEIN;  Surgeon: Rexene Alberts, MD;  Location: [place] [facility];  Service: Open Heart Surgery;  Laterality: N/A;  . ENDOVEIN HARVEST OF GREATER SAPHENOUS VEIN Right 06/25/2018   Procedure: ENDOVEIN HARVEST OF GREATER SAPHENOUS VEIN;  Surgeon: Rexene Alberts, MD;  Location: [facility] [place];  Service: Open Heart Surgery;  Laterality: Right;  . LEFT HEART CATH AND CORONARY ANGIOGRAPHY N/A 06/22/2018   Procedure: LEFT HEART CATH  AND CORONARY ANGIOGRAPHY;  Surgeon: Nigel Mormon, MD;  Location: [facility];  Service: Cardiovascular;  Laterality: N/A;  . surgical debridement  Right    Right foot ulcer  . TEE WITHOUT CARDIOVERSION N/A 06/25/2018   Procedure: TRANSESOPHAGEAL ECHOCARDIOGRAM (TEE);  Surgeon: Rexene Alberts, MD;  Location: [facility];  Service: Open Heart Surgery;  Laterality: N/A;     Current Meds  Medication Sig  . aspirin EC 81 MG tablet Take 81 mg by mouth daily.  Marland Kitchen atorvastatin (LIPITOR) 80 MG tablet Take 1 tablet (80 mg total) by mouth daily at 6 PM.  . azelastine (ASTELIN) 0.1 % nasal spray Place 2 sprays into both nostrils 2 (two) times daily. Use in each nostril as directed  . Blood Glucose Monitoring Suppl (FREESTYLE LITE) DEVI   . canagliflozin (INVOKANA) 100 MG TABS tablet TAKE 1 TABLET (100 MG TOTAL) BY MOUTH DAILY BEFORE BREAKFAST.  Marland Kitchen cetirizine (ZYRTEC) 10 MG tablet Take 1 tablet (10 mg total) by mouth daily.  . clopidogrel (PLAVIX) 75 MG tablet Take 1 tablet (75 mg total) by mouth daily.  Marland Kitchen glucose blood test strip Use as instructed to check 1-2x a day  . Lancets MISC Check sugars 3 times a day E11.9  . lisinopril (PRINIVIL,ZESTRIL) 10 MG tablet Take 1 tablet (10 mg total) by mouth daily.  . metFORMIN (GLUCOPHAGE) 500 MG tablet Take 2 tablets (1,000 mg total) by mouth 2 (two) times daily with a meal.  . metoprolol tartrate (LOPRESSOR) 25 MG tablet Take 1 tablet (25 mg total) by mouth 2 (two) times daily.  Marland Kitchen sulfamethoxazole-trimethoprim (BACTRIM) 400-80 MG tablet Take 1 tablet by mouth 2 (two) times daily.  . traMADol (ULTRAM) 50 MG tablet      Allergies:   Apricot kernel oil [prunus]; Cherry; Jardiance [empagliflozin]; Other; Peach [prunus persica]; and Plum pulp   Social History   Tobacco Use  . Smoking status: Never Smoker  . Smokeless tobacco: Never Used  Substance Use Topics  . Alcohol use: Yes    Alcohol/week: 2.0 standard drinks    Types: 2 Cans of beer per week     Comment: 2 cans of beer a month  . Drug use: Never     Family Hx: [name] family history includes Colon polyps in his father; Diabetes in his mother; Hearing loss in his father; Hypertension in his father; Stroke in his father. There is no history of Colon cancer, Esophageal cancer, or Prostate cancer.  ROS:   Please see the history of present illness.     All other systems reviewed and are negative.   Prior CV studies:   The following studies were reviewed today:  None  Labs/Other Tests and Data Reviewed:    EKG:  No ECG reviewed.  Recent Labs: 06/23/2018: ALT 20; B Natriuretic Peptide 73.0 06/26/2018: Magnesium 2.2 06/28/2018: BUN 18; Creatinine, Ser 0.91; Hemoglobin 11.4; Platelets 279; Potassium 3.6; Sodium 134   Recent Lipid Panel Lab Results  Component Value Date/Time   CHOL 120  06/23/2018 04:15 AM   TRIG 94 06/23/2018 04:15 AM   HDL 32 (L) 06/23/2018 04:15 AM   CHOLHDL 3.8 06/23/2018 04:15 AM   LDLCALC 69 06/23/2018 04:15 AM   LDLDIRECT 93.0 01/17/2018 09:56 AM    Wt Readings from Last 3 Encounters:  11/21/18 230 lb (104.3 kg)  10/03/18 240 lb (108.9 kg)  07/28/18 241 lb (109.3 kg)     Objective:    Vital Signs:  BP 116/74   Pulse 88   Ht 6\' 3"  (1.905 m)   Wt 230 lb (104.3 kg)   BMI 28.75 kg/m    VITAL SIGNS:  reviewed RESPIRATORY:  normal respiratory effort, symmetric expansion NEURO:  alert and oriented x 3, no obvious focal deficit PSYCH:  normal affect  ASSESSMENT & PLAN:    1. Coronary artery disease- history of CAD status post non-STEMI 06/22/2018 with cardiac catheterization revealing three-vessel disease ultimately requiring CABG x4 3 days later by Dr. Roxy Manns.  He is done well since although he did not participate in cardiac rehab.  He is on appropriate medications and denies chest pain or shortness of breath. 2. Essential hypertension- blood pressure measured by the patient this morning at home was 117/77 with a pulse of 88.  He is  on lisinopril and metoprolol 3. Hyperlipidemia- on high-dose atorvastatin followed by his PCP 4. Diabetic foot ulcers- followed by Dr. Cannon Kettle, his podiatrist, which have since healed. 5. Obstructive sleep apnea- negative sleep study  COVID-19 Education: The signs and symptoms of COVID-19 were discussed with the patient and how to seek care for testing (follow up with PCP or arrange E-visit).  The importance of social distancing was discussed today.  Time:   Today, I have spent 12 minutes with the patient with telehealth technology discussing the above problems.     Medication Adjustments/Labs and Tests Ordered: Current medicines are reviewed at length with the patient today.  Concerns regarding medicines are outlined above.   Tests Ordered: No orders of the defined types were placed in this encounter.   Medication Changes: No orders of the defined types were placed in this encounter.   Disposition:  Follow up in 6 month(s)  Signed, Quay Burow, MD  11/21/2018 11:49 AM    [place]

## 2018-11-21 NOTE — Telephone Encounter (Signed)
LVM STATING THAT DR. Gwenlyn Found WOULD LIKE PT TO F/U IN 6 MOS AND THAT AVS WOULD BE SENT TO ADDRESS ON FILE

## 2018-11-24 MED FILL — ATORVASTATIN 80 MG TABLET: 80 | 90 days supply | Qty: 90 | Fill #1

## 2018-11-24 MED FILL — AZELASTINE HCL 137 MCG SPRY: 0.1 | 25 days supply | Qty: 30 | Fill #1

## 2018-11-24 MED FILL — METOPROLOL TARTRATE 25 MG T: 25 | 90 days supply | Qty: 180 | Fill #1

## 2018-11-25 ENCOUNTER — Ambulatory Visit: Payer: 59 | Admitting: Sports Medicine

## 2018-12-01 MED FILL — CLOPIDOGREL 75 MG TABLET: 75 | 90 days supply | Qty: 90 | Fill #1

## 2018-12-02 ENCOUNTER — Ambulatory Visit: Payer: 59 | Admitting: Sports Medicine

## 2018-12-03 MED FILL — AMOXICILLIN 500 MG CAPSULE: 500 | 9 days supply | Qty: 30 | Fill #0

## 2018-12-03 MED FILL — CHLORHEXIDINE 0.12% RINSE: 0.12 | 15 days supply | Qty: 473 | Fill #0

## 2019-01-06 MED FILL — metFORMIN HCL 500 MG TABS: 500 | 90 days supply | Qty: 360 | Fill #2

## 2019-01-06 MED FILL — LISINOPRIL 10 MG TABLET: 10 | 90 days supply | Qty: 90 | Fill #2

## 2019-01-27 MED FILL — INVOKANA 100 MG TABLET: 100 | 90 days supply | Qty: 90 | Fill #1

## 2019-02-23 MED FILL — CLOPIDOGREL 75 MG TABLET: 75 | 90 days supply | Qty: 90 | Fill #2

## 2019-02-23 MED FILL — ATORVASTATIN 80 MG TABLET: 80 | 90 days supply | Qty: 90 | Fill #2

## 2019-02-23 MED FILL — METOPROLOL TARTRATE 25 MG T: 25 | 90 days supply | Qty: 180 | Fill #2

## 2019-02-24 ENCOUNTER — Telehealth: Payer: Self-pay | Admitting: *Deleted

## 2019-02-24 NOTE — Telephone Encounter (Signed)
[facility] sent a request for sildnafil 20mg .  It looks like this was discontinued at discharge from hospital in December.  Do you want to refill?

## 2019-02-25 NOTE — Telephone Encounter (Signed)
Mailbox full

## 2019-02-25 NOTE — Telephone Encounter (Signed)
Notify pt I am very reluctant to write this as he had hx of severe coronary artery disease in the past. I can explain on virtual visit or telephone call.   I am going to send message to Dr. Gwenlyn Found to get his opinion as well.

## 2019-03-03 DIAGNOSIS — H524 Presbyopia: Secondary | ICD-10-CM | POA: Diagnosis not present

## 2019-03-03 DIAGNOSIS — H5213 Myopia, bilateral: Secondary | ICD-10-CM | POA: Diagnosis not present

## 2019-03-03 DIAGNOSIS — H35033 Hypertensive retinopathy, bilateral: Secondary | ICD-10-CM | POA: Diagnosis not present

## 2019-03-03 DIAGNOSIS — H52203 Unspecified astigmatism, bilateral: Secondary | ICD-10-CM | POA: Diagnosis not present

## 2019-03-18 ENCOUNTER — Encounter: Payer: Self-pay | Admitting: Medical

## 2019-03-18 NOTE — Telephone Encounter (Signed)
Virtual scheduled for 8/25 at 1pm. Pt states to let him know if he needs to contact cardiology before this appt.

## 2019-03-19 ENCOUNTER — Telehealth: Payer: Self-pay | Admitting: Medical

## 2019-03-19 MED ORDER — SILDENAFIL CITRATE 50 MG PO TABS: 50.0000 mg | ORAL_TABLET | Freq: Every day | ORAL | 0 refills | Status: DC | PRN

## 2019-03-19 MED FILL — SILDENAFIL CITRATE 50 MG TA: 50 | 30 days supply | Qty: 6 | Fill #0

## 2019-03-19 NOTE — Telephone Encounter (Signed)
Reviewed cardiologist note that stated no contraindication to [place] of viagra.. rx sent in.

## 2019-03-20 ENCOUNTER — Encounter: Payer: Self-pay | Admitting: Internal Medicine

## 2019-03-20 ENCOUNTER — Other Ambulatory Visit: Payer: Self-pay

## 2019-03-20 ENCOUNTER — Ambulatory Visit (INDEPENDENT_AMBULATORY_CARE_PROVIDER_SITE_OTHER): Payer: 59 | Admitting: Internal Medicine

## 2019-03-20 DIAGNOSIS — E782 Mixed hyperlipidemia: Secondary | ICD-10-CM | POA: Diagnosis not present

## 2019-03-20 DIAGNOSIS — R200 Anesthesia of skin: Secondary | ICD-10-CM

## 2019-03-20 DIAGNOSIS — E1159 Type 2 diabetes mellitus with other circulatory complications: Secondary | ICD-10-CM

## 2019-03-20 DIAGNOSIS — E1165 Type 2 diabetes mellitus with hyperglycemia: Secondary | ICD-10-CM | POA: Diagnosis not present

## 2019-03-20 NOTE — Patient Instructions (Signed)
Please continue: - Metformin 1000 mg 2x a day - Invokana 100 mg before breakfast  Please return in 3-4 months with your sugar log.  

## 2019-03-20 NOTE — Progress Notes (Signed)
Patient ID: Joshua Branch, male   DOB: 1960/09/26, 58 y.o.   MRN: [id number]   Patient location: Home My location: Office  Referring Provider: Mackie Pai, PA-C  I connected with the patient on 03/20/19 at  8:12 AM EDT by a video enabled telemedicine application and verified that I am speaking with the correct person.   I discussed the limitations of evaluation and management by telemedicine and the availability of in person appointments. The patient expressed understanding and agreed to proceed.   Details of the encounter are shown below.  HPI: Joshua Branch is a 58 y.o.-year-old male, presenting for follow-up for DM2, dx before 2017, non-insulin-dependent, uncontrolled, with long-term complications (CAD - s/p AMI - s/p CABG x4 06/2018, nonhealing skin ulcer).  Last visit 4.5 months ago (virtual).  He was referred to endocrinology after he had an acute MI in 05/2018.  At that time, he had to have CABG x4.  Reviewed his HbA1c levels: Lab Results  Component Value Date   HGBA1C 8.2 (H) 06/23/2018   HGBA1C 11.7 (H) 01/17/2018   HGBA1C 10.8 (H) 12/07/2016   HGBA1C 9.2 (H) 11/30/2015   He started to change his diet in 02/2018: He eliminated fast foods, eats less sweets, less alcohol and sugars improved significantly.  He is on: - Metformin 1000 mg 2x a day, with meals - started 12/2017, increased 06/2018 - Jardiance 10 mg in a.m.  >> abd. Pain, nausea, diarrhea >> now Invokana 100 mg in a.m.-tolerates this well  Pt checks his sugars once a day: - am: 155-165 >> 102-135 >> 120s - 2h after b'fast: 175-185, 200 >> 130-160 >> 140-145 - before lunch:  - 2h after lunch: n/c - before dinner: n/c - 2h after dinner: 180s >> 130-160 >> 140-150 - bedtime: n/c - nighttime: n/c Lowest sugar was 115-120 >> 102 >> 107; it is unclear at which level he has hypoglycemia awareness Highest sugar was 204 >> 185 >> 181.  Glucometer: True Metrix  Pt's meals are: - Breakfast: protein shake,  smoothie, yoghurt, occas. Eggs and bacon - Lunch: sandwich, chips - Dinner: chicken, veggie - Snacks: 3 a day  -No CKD, last BUN/creatinine:  Lab Results  Component Value Date   BUN 18 06/28/2018   BUN 16 06/27/2018   CREATININE 0.91 06/28/2018   CREATININE 0.81 06/27/2018  On lisinopril 10.  -+ HL; last set of lipids: Lab Results  Component Value Date   CHOL 120 06/23/2018   HDL 32 (L) 06/23/2018   LDLCALC 69 06/23/2018   LDLDIRECT 93.0 01/17/2018   TRIG 94 06/23/2018   CHOLHDL 3.8 06/23/2018  On Lipitor 80.  - last eye exam was in 02/2019: No DR but he has hypertensive retinopathy. Dr. Rex Kras at [facility].  -+ Numbness and tingling in his feet, but also numbness in his fingertips  On ASA 81.  Pt has FH of DM in mother, sister.  He has numbness in his fingers.  B12 was normal: Lab Results  Component Value Date   VITAMINB12 642 07/15/2018    ROS: Constitutional: no weight gain/no weight loss, no fatigue, no subjective hyperthermia, no subjective hypothermia Eyes: no blurry vision, no xerophthalmia ENT: no sore throat, no nodules palpated in neck, no dysphagia, no odynophagia, no hoarseness Cardiovascular: no CP/no SOB/no palpitations/no leg swelling Respiratory: no cough/no SOB/no wheezing Gastrointestinal: no N/no V/no D/no C/no acid reflux Musculoskeletal: no muscle aches/no joint aches Skin: no rashes, no hair loss Neurological: no tremors/+ numbness -tips of fingers/+  tingling/no dizziness  I reviewed pt's medications, allergies, PMH, social hx, family hx, and changes were documented in the history of present illness. Otherwise, unchanged from my initial visit note.  Past Medical History:  Diagnosis Date  . Acute myocardial infarction ([place]) 06/22/2018  . Allergy   . Charcot's joint of left foot, non-diabetic   . Coronary artery disease involving native coronary artery with unstable angina pectoris ([place]) 06/22/2018  . Diabetes ([place])    type 2  .  Hyperlipidemia   . Hypertension   . Neuropathy of left foot   . Obstructive sleep apnea   . Right foot ulcer ([place])   . 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 Type II diabetes mellitus with complication, uncontrolled ([place])   . Uncontrolled type 2 diabetes mellitus ([place])    Past Surgical History:  Procedure Laterality Date  . CORONARY ARTERY BYPASS GRAFT N/A 06/25/2018   Procedure: CORONARY ARTERY BYPASS GRAFTING (CABG) TIMES FOUR USING LEFT INTERNAL MAMMARY ARTERY AND RIGHT ENDOSCOPICALLY HARVESTED SAPHENOUS VEIN;  Surgeon: Rexene Alberts, MD;  Location: [facility];  Service: Open Heart Surgery;  Laterality: N/A;  . ENDOVEIN HARVEST OF GREATER SAPHENOUS VEIN Right 06/25/2018   Procedure: ENDOVEIN HARVEST OF GREATER SAPHENOUS VEIN;  Surgeon: Rexene Alberts, MD;  Location: [place];  Service: Open Heart Surgery;  Laterality: Right;  . LEFT HEART CATH AND CORONARY ANGIOGRAPHY N/A 06/22/2018   Procedure: LEFT HEART CATH AND CORONARY ANGIOGRAPHY;  Surgeon: Nigel Mormon, MD;  Location: [place];  Service: Cardiovascular;  Laterality: N/A;  . surgical debridement  Right    Right foot ulcer  . TEE WITHOUT CARDIOVERSION N/A 06/25/2018   Procedure: TRANSESOPHAGEAL ECHOCARDIOGRAM (TEE);  Surgeon: Rexene Alberts, MD;  Location: [place];  Service: Open Heart Surgery;  Laterality: N/A;   Social History   Socioeconomic History  . Marital status: Married    Spouse name: [name]  . Number of children: 2  . Years of education: Not on file  . Highest education level: Professional school degree (e.g., MD, DDS, DVM, JD)  Occupational History  . Occupation: attorney  Social Needs  . Financial resource strain: Not hard at all  . Food insecurity    Worry: Not on file    Inability: Not on file  . Transportation needs    Medical: Not on file    Non-medical: Not on file  Tobacco Use  . Smoking status: Never Smoker  .  Smokeless tobacco: Never Used  Substance and Sexual Activity  . Alcohol use: Yes    Alcohol/week: 2.0 standard drinks    Types: 2 Cans of beer per week    Comment: 2 cans of beer a month  . Drug use: Never  . Sexual activity: Not on file  Lifestyle  . Physical activity    Days per week: Not on file    Minutes per session: Not on file  . Stress: Not on file  Relationships  . Social Herbalist on phone: Not on file    Gets together: Not on file    Attends religious service: Not on file    Active member of club or organization: Not on file    Attends meetings of clubs or organizations: Not on file    Relationship status: Not on file  . Intimate partner violence    Fear of  current or ex partner: Not on file    Emotionally abused: Not on file    Physically abused: Not on file    Forced sexual activity: Not on file  Other Topics Concern  . Not on file  Social History Narrative  . Not on file   Current Outpatient Medications on File Prior to Visit  Medication Sig Dispense Refill  . aspirin EC 81 MG tablet Take 81 mg by mouth daily.    Marland Kitchen atorvastatin (LIPITOR) 80 MG tablet Take 1 tablet (80 mg total) by mouth daily at 6 PM. 90 tablet 3  . azelastine (ASTELIN) 0.1 % nasal spray Place 2 sprays into both nostrils 2 (two) times daily. Use in each nostril as directed 30 mL 1  . Blood Glucose Monitoring Suppl (FREESTYLE LITE) DEVI     . canagliflozin (INVOKANA) 100 MG TABS tablet TAKE 1 TABLET (100 MG TOTAL) BY MOUTH DAILY BEFORE BREAKFAST. 90 tablet 3  . cetirizine (ZYRTEC) 10 MG tablet Take 1 tablet (10 mg total) by mouth daily. 30 tablet 11  . clopidogrel (PLAVIX) 75 MG tablet Take 1 tablet (75 mg total) by mouth daily. 90 tablet 3  . glucose blood test strip Use as instructed to check 1-2x a day 100 each 12  . Lancets MISC Check sugars 3 times a day E11.9 100 each 0  . lisinopril (PRINIVIL,ZESTRIL) 10 MG tablet Take 1 tablet (10 mg total) by mouth daily. 90 tablet 3  .  metFORMIN (GLUCOPHAGE) 500 MG tablet Take 2 tablets (1,000 mg total) by mouth 2 (two) times daily with a meal. 360 tablet 3  . metoprolol tartrate (LOPRESSOR) 25 MG tablet Take 1 tablet (25 mg total) by mouth 2 (two) times daily. 180 tablet 3  . sildenafil (VIAGRA) 50 MG tablet Take 1 tablet (50 mg total) by mouth daily as needed for erectile dysfunction. 10 tablet 0  . sulfamethoxazole-trimethoprim (BACTRIM) 400-80 MG tablet Take 1 tablet by mouth 2 (two) times daily. 28 tablet 0  . traMADol (ULTRAM) 50 MG tablet   0   No current facility-administered medications on file prior to visit.    Allergies  Allergen Reactions  . Apricot Kernel Oil [Prunus] Swelling    THROAT  . Cherry Swelling    THROAT  . Jardiance [Empagliflozin] Diarrhea and Nausea And Vomiting  . Other Other (See Comments)    Fruits with a pit. Throat swells up. Can have them if cooked   . Peach [Prunus Persica] Swelling    THROAT  . Plum Pulp Swelling    THROAT   Family History  Problem Relation Age of Onset  . Diabetes Mother   . Stroke Father   . Hypertension Father   . Hearing loss Father   . Colon polyps Father   . Colon cancer Neg Hx   . Esophageal cancer Neg Hx   . Prostate cancer Neg Hx    PE: There were no vitals taken for this visit. Wt Readings from Last 3 Encounters:  11/21/18 230 lb (104.3 kg)  10/03/18 240 lb (108.9 kg)  07/28/18 241 lb (109.3 kg)   Constitutional:  in NAD  The physical exam was not performed (virtual visit).  ASSESSMENT: 1. DM2, non-insulin-dependent, uncontrolled, with long-term complications - CAD - s/p AMI - s/p CABG x4 06/2018-sees Dr. Gwenlyn Found - nonhealing skin ulcer  2.  Numbness in fingers  3. HL  PLAN:  1. Patient with history of longstanding, uncontrolled, type 2 diabetes, on oral antidiabetic regimen, with  metformin and SGLT2 inhibitor. He has a significant history of an MI, for which she had to have CABG.  After this, we tried to start Jardiance 10 mg daily  and stopped his daily HCTZ 12.5 mg.  However, he developed abdominal pain from [place] and we decided to try Invokana.  He tolerates this well, without any GI symptoms. -Before last visit, he started to change his diet by cutting down fast foods, sweets, alcohol.  Sugars improved significantly and HbA1c improved from 11.7% to 8.2% at last in person visit. -Last visit was virtual so we could not check an HbA1c but he was reporting even better blood sugars and he lost few more pounds.  At that time he was interested in reducing his medication doses and we discussed that we may be able to decrease the metformin dose but I suggested to continue Invokana based on its effect on the cardiovascular risk. -at this visit, sugars are still very good, even improved after meals.  His sugars in the morning are still at the upper limit of normal and we discussed about possible reasons for this: Large or late dinners, excesscarbs or proteins with dinner, lack of exercise, increase insulin resistance, dawn phenomenon. -No need to change his regimen for now. -He continues to work on his diet and also walks and he feels much better when he is compliant with these measures. -he had 3 ulcers on both feet.  Per review of the chart, they grew strep agalactiae.  These improved. If they worsen, we may need to stop Invokana, but for now, we will continue this. - I suggested to:  Patient Instructions  Please continue: - Metformin 1000 mg 2x a day - Invokana 100 mg before breakfast  Please return in 3-4 months with your sugar log.   - we would check his HbA1c when he returns to the clinic - advised to check sugars at different times of the day - 1x a day, rotating check times - advised for yearly eye exams >> he is UTD - return to clinic in 3-4 months   2.  Numbness in fingertips -B12 was normal at last check -Possibly related to diabetes versus cervical nerve entrapment  3. HL - Reviewed latest lipid panel from  05/2018: LDL at goal, improved, HDL slightly low, triglycerides excellent Lab Results  Component Value Date   CHOL 120 06/23/2018   HDL 32 (L) 06/23/2018   LDLCALC 69 06/23/2018   LDLDIRECT 93.0 01/17/2018   TRIG 94 06/23/2018   CHOLHDL 3.8 06/23/2018  - Continues  high-dose Lipitor without side effects.  Philemon Kingdom, MD PhD [facility]

## 2019-03-24 ENCOUNTER — Ambulatory Visit: Payer: 59 | Admitting: Medical

## 2019-04-02 ENCOUNTER — Encounter: Payer: Self-pay | Admitting: Medical

## 2019-04-02 ENCOUNTER — Telehealth: Payer: Self-pay | Admitting: Medical

## 2019-04-02 MED ORDER — SILDENAFIL CITRATE 20 MG PO TABS: ORAL_TABLET | ORAL | 0 refills | Status: DC

## 2019-04-02 MED FILL — SILDENAFIL CITRATE 20 MG TA: 20 | 20 days supply | Qty: 100 | Fill #0

## 2019-04-02 NOTE — Telephone Encounter (Signed)
rx 20 mg viagra sent to pt pharmacy.

## 2019-04-09 MED FILL — metFORMIN HCL 500 MG TABS: 500 | 90 days supply | Qty: 360 | Fill #3

## 2019-04-09 MED FILL — LISINOPRIL 10 MG TABS: 10 | 90 days supply | Qty: 90 | Fill #3

## 2019-04-21 ENCOUNTER — Other Ambulatory Visit: Payer: Self-pay | Admitting: Internal Medicine

## 2019-04-21 DIAGNOSIS — M14672 Charcot's joint, left ankle and foot: Secondary | ICD-10-CM | POA: Diagnosis not present

## 2019-04-21 DIAGNOSIS — L97512 Non-pressure chronic ulcer of other part of right foot with fat layer exposed: Secondary | ICD-10-CM | POA: Diagnosis not present

## 2019-04-21 DIAGNOSIS — M6701 Short Achilles tendon (acquired), right ankle: Secondary | ICD-10-CM | POA: Diagnosis not present

## 2019-04-21 DIAGNOSIS — M6702 Short Achilles tendon (acquired), left ankle: Secondary | ICD-10-CM | POA: Diagnosis not present

## 2019-04-21 DIAGNOSIS — L97522 Non-pressure chronic ulcer of other part of left foot with fat layer exposed: Secondary | ICD-10-CM | POA: Diagnosis not present

## 2019-04-22 MED FILL — INVOKANA 100 MG TABLET: 100 | 90 days supply | Qty: 90 | Fill #2

## 2019-04-24 DIAGNOSIS — L97512 Non-pressure chronic ulcer of other part of right foot with fat layer exposed: Secondary | ICD-10-CM | POA: Diagnosis not present

## 2019-05-05 ENCOUNTER — Ambulatory Visit: Payer: 59 | Admitting: Medical

## 2019-05-05 DIAGNOSIS — M14672 Charcot's joint, left ankle and foot: Secondary | ICD-10-CM | POA: Diagnosis not present

## 2019-05-05 DIAGNOSIS — M6702 Short Achilles tendon (acquired), left ankle: Secondary | ICD-10-CM | POA: Diagnosis not present

## 2019-05-05 DIAGNOSIS — L97522 Non-pressure chronic ulcer of other part of left foot with fat layer exposed: Secondary | ICD-10-CM | POA: Diagnosis not present

## 2019-05-05 DIAGNOSIS — M6701 Short Achilles tendon (acquired), right ankle: Secondary | ICD-10-CM | POA: Diagnosis not present

## 2019-05-05 DIAGNOSIS — L97512 Non-pressure chronic ulcer of other part of right foot with fat layer exposed: Secondary | ICD-10-CM | POA: Diagnosis not present

## 2019-05-08 ENCOUNTER — Ambulatory Visit (INDEPENDENT_AMBULATORY_CARE_PROVIDER_SITE_OTHER): Payer: 59 | Admitting: Medical

## 2019-05-08 ENCOUNTER — Other Ambulatory Visit: Payer: Self-pay

## 2019-05-08 ENCOUNTER — Encounter: Payer: Self-pay | Admitting: Medical

## 2019-05-08 VITALS — BP 116/76 | HR 81 | Temp 95.6°F | Resp 16 | Ht 75.0 in | Wt 239.8 lb

## 2019-05-08 DIAGNOSIS — M542 Cervicalgia: Secondary | ICD-10-CM

## 2019-05-08 DIAGNOSIS — E1169 Type 2 diabetes mellitus with other specified complication: Secondary | ICD-10-CM | POA: Diagnosis not present

## 2019-05-08 DIAGNOSIS — R5383 Other fatigue: Secondary | ICD-10-CM | POA: Diagnosis not present

## 2019-05-08 DIAGNOSIS — I10 Essential (primary) hypertension: Secondary | ICD-10-CM | POA: Diagnosis not present

## 2019-05-08 DIAGNOSIS — R252 Cramp and spasm: Secondary | ICD-10-CM

## 2019-05-08 DIAGNOSIS — Z794 Long term (current) use of insulin: Secondary | ICD-10-CM

## 2019-05-08 DIAGNOSIS — E785 Hyperlipidemia, unspecified: Secondary | ICD-10-CM | POA: Diagnosis not present

## 2019-05-08 LAB — MAGNESIUM: Magnesium: 2.1 mg/dL (ref 1.5–2.5)

## 2019-05-08 LAB — LIPID PANEL
Cholesterol: 113 mg/dL (ref 0–200)
HDL: 33.8 mg/dL — ABNORMAL LOW (ref >39.00)
LDL Cholesterol: 53 mg/dL (ref 0–99)
NonHDL: 79.11
Total CHOL/HDL Ratio: 3
Triglycerides: 131 mg/dL (ref 0.0–149.0)
VLDL: 26.2 mg/dL (ref 0.0–40.0)

## 2019-05-08 LAB — TSH: TSH: 2.05 u[IU]/mL (ref 0.35–4.50)

## 2019-05-08 LAB — COMPREHENSIVE METABOLIC PANEL
ALT: 12 U/L (ref 0–53)
AST: 11 U/L (ref 0–37)
Albumin: 3.9 g/dL (ref 3.5–5.2)
Alkaline Phosphatase: 91 U/L (ref 39–117)
BUN: 16 mg/dL (ref 6–23)
CO2: 32 mEq/L (ref 19–32)
Calcium: 9.4 mg/dL (ref 8.4–10.5)
Chloride: 101 mEq/L (ref 96–112)
Creatinine, Ser: 0.88 mg/dL (ref 0.40–1.50)
GFR: 89.34 mL/min (ref >60.00)
Glucose, Bld: 135 mg/dL — ABNORMAL HIGH (ref 70–99)
Potassium: 4.5 mEq/L (ref 3.5–5.1)
Sodium: 141 mEq/L (ref 135–145)
Total Bilirubin: 0.8 mg/dL (ref 0.2–1.2)
Total Protein: 6.7 g/dL (ref 6.0–8.3)

## 2019-05-08 LAB — VITAMIN B12: Vitamin B-12: 236 pg/mL (ref 211–911)

## 2019-05-08 LAB — HEMOGLOBIN A1C: Hgb A1c MFr Bld: 7.8 % — ABNORMAL HIGH (ref 4.6–6.5)

## 2019-05-08 MED FILL — FREESTYLE LITE TEST STRIP: 50 days supply | Qty: 100 | Fill #1

## 2019-05-08 NOTE — Progress Notes (Signed)
Subjective:    Patient ID: Joshua Branch, male    DOB: 03/29/1961, 58 y.o.   MRN: [id number]  HPI  Pt in today for follow up. He has hx of htn, diabetes, hyperlipidemia, and CABG.   He sees Dr. Renne Crigler for diabetes. Endocrinologist plan comments 03/20/2019 indicate on metformin and invokana now. On review I don't see recent a1c. Pt states his morning sugars when he checks 125 fasting. When exercises he notes sugar will be close to 105.  Also hx of foot ulcers treated by podiatrist Dr. Cannon Kettle. Last seen 11/11/2018. He was supposed to follow up in 2 weeks. On epic review appears did not keep that appointment. Pt now seeing Dr. Delora Fuel at [facility] now healing.  Pt has seen his cardiologist virtually in April.  On review of that note he did not participate in cardiac rehab due to ulcers on feet. Cardiolgist has him on high dose statin. Pt currently on lisinopril and metroprolol for htn. HCTZ stopped in past.  Hx of ED. Cardiologist stated not contraindication to viagra.  Pt states his hands feel little stiff, decreased sensation, and rare tingly. States has some mild neck discomfort at times. Some crepitus.   Pt states he still feels some fatigue. Better than it was in the past.   Review of Systems  Constitutional: Positive for fatigue. Negative for chills and fever.  HENT: Negative for congestion, facial swelling, hearing loss, sinus pressure, sneezing and sore throat.   Respiratory: Negative for chest tightness, shortness of breath and wheezing.   Cardiovascular: Negative for chest pain and palpitations.  Gastrointestinal: Negative for abdominal pain, blood in stool and constipation.  Genitourinary: Negative for flank pain and frequency.  Musculoskeletal: Negative for back pain.  Neurological: Negative for dizziness, speech difficulty, weakness, numbness and headaches.  Hematological: Negative for adenopathy. Does not bruise/bleed easily.  Psychiatric/Behavioral: Negative for  behavioral problems. The patient is not nervous/anxious.      Past Medical History:  Diagnosis Date  . Acute myocardial infarction ([place]) 06/22/2018  . Allergy   . Charcot's joint of left foot, non-diabetic   . Coronary artery disease involving native coronary artery with unstable angina pectoris ([place]) 06/22/2018  . Diabetes ([place])    type 2  . Hyperlipidemia   . Hypertension   . Neuropathy of left foot   . Obstructive sleep apnea   . Right foot ulcer ([place])   . 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 Type II diabetes mellitus with complication, uncontrolled ([place])   . Uncontrolled type 2 diabetes mellitus ([place])      Social History   Socioeconomic History  . Marital status: Married    Spouse name: [name]  . Number of children: 2  . Years of education: Not on file  . Highest education level: Professional school degree (e.g., MD, DDS, DVM, JD)  Occupational History  . Occupation: attorney  Social Needs  . Financial resource strain: Not hard at all  . Food insecurity    Worry: Not on file    Inability: Not on file  . Transportation needs    Medical: Not on file    Non-medical: Not on file  Tobacco Use  . Smoking status: Never Smoker  . Smokeless tobacco: Never Used  Substance and Sexual Activity  . Alcohol use: Yes    Alcohol/week: 2.0 standard drinks    Types: 2  Cans of beer per week    Comment: 2 cans of beer a month  . Drug use: Never  . Sexual activity: Not on file  Lifestyle  . Physical activity    Days per week: Not on file    Minutes per session: Not on file  . Stress: Not on file  Relationships  . Social Herbalist on phone: Not on file    Gets together: Not on file    Attends religious service: Not on file    Active member of club or organization: Not on file    Attends meetings of clubs or organizations: Not on file    Relationship status: Not on file  . Intimate partner  violence    Fear of current or ex partner: Not on file    Emotionally abused: Not on file    Physically abused: Not on file    Forced sexual activity: Not on file  Other Topics Concern  . Not on file  Social History Narrative  . Not on file    Past Surgical History:  Procedure Laterality Date  . CORONARY ARTERY BYPASS GRAFT N/A 06/25/2018   Procedure: CORONARY ARTERY BYPASS GRAFTING (CABG) TIMES FOUR USING LEFT INTERNAL MAMMARY ARTERY AND RIGHT ENDOSCOPICALLY HARVESTED SAPHENOUS VEIN;  Surgeon: Rexene Alberts, MD;  Location: [facility];  Service: Open Heart Surgery;  Laterality: N/A;  . ENDOVEIN HARVEST OF GREATER SAPHENOUS VEIN Right 06/25/2018   Procedure: ENDOVEIN HARVEST OF GREATER SAPHENOUS VEIN;  Surgeon: Rexene Alberts, MD;  Location: [facility];  Service: Open Heart Surgery;  Laterality: Right;  . LEFT HEART CATH AND CORONARY ANGIOGRAPHY N/A 06/22/2018   Procedure: LEFT HEART CATH AND CORONARY ANGIOGRAPHY;  Surgeon: Nigel Mormon, MD;  Location: [facility];  Service: Cardiovascular;  Laterality: N/A;  . surgical debridement  Right    Right foot ulcer  . TEE WITHOUT CARDIOVERSION N/A 06/25/2018   Procedure: TRANSESOPHAGEAL ECHOCARDIOGRAM (TEE);  Surgeon: Rexene Alberts, MD;  Location: [facility];  Service: Open Heart Surgery;  Laterality: N/A;    Family History  Problem Relation Age of Onset  . Diabetes Mother   . Stroke Father   . Hypertension Father   . Hearing loss Father   . Colon polyps Father   . Colon cancer Neg Hx   . Esophageal cancer Neg Hx   . Prostate cancer Neg Hx     Allergies  Allergen Reactions  . Apricot Kernel Oil [Prunus] Swelling    THROAT  . Cherry Swelling    THROAT  . Jardiance [Empagliflozin] Diarrhea and Nausea And Vomiting  . Other Other (See Comments)    Fruits with a pit. Throat swells up. Can have them if cooked   . Peach [Prunus Persica] Swelling    THROAT  . Plum Pulp Swelling    THROAT    Current Outpatient Medications  on File Prior to Visit  Medication Sig Dispense Refill  . aspirin EC 81 MG tablet Take 81 mg by mouth daily.    Marland Kitchen atorvastatin (LIPITOR) 80 MG tablet Take 1 tablet (80 mg total) by mouth daily at 6 PM. 90 tablet 3  . azelastine (ASTELIN) 0.1 % nasal spray Place 2 sprays into both nostrils 2 (two) times daily. Use in each nostril as directed 30 mL 1  . Blood Glucose Monitoring Suppl (FREESTYLE LITE) DEVI     . canagliflozin (INVOKANA) 100 MG TABS tablet TAKE 1 TABLET (100 MG TOTAL) BY MOUTH  DAILY BEFORE BREAKFAST. 90 tablet 3  . cetirizine (ZYRTEC) 10 MG tablet Take 1 tablet (10 mg total) by mouth daily. 30 tablet 11  . chlorhexidine (PERIDEX) 0.12 % solution     . clopidogrel (PLAVIX) 75 MG tablet Take 1 tablet (75 mg total) by mouth daily. 90 tablet 3  . glucose blood test strip Use as instructed to check 1-2x a day 100 each 12  . Lancets MISC Check sugars 3 times a day E11.9 100 each 0  . lisinopril (ZESTRIL) 10 MG tablet TAKE 1 TABLET (10 MG TOTAL) BY MOUTH DAILY. 90 tablet 3  . metFORMIN (GLUCOPHAGE) 500 MG tablet Take 2 tablets (1,000 mg total) by mouth 2 (two) times daily with a meal. 360 tablet 3  . metoprolol tartrate (LOPRESSOR) 25 MG tablet Take 1 tablet (25 mg total) by mouth 2 (two) times daily. 180 tablet 3  . sildenafil (REVATIO) 20 MG tablet 2-5 tab po prn  one hour before sex.(max 100 mg in 24 hours period. 100 tablet 0  . sildenafil (VIAGRA) 50 MG tablet Take 1 tablet (50 mg total) by mouth daily as needed for erectile dysfunction. 10 tablet 0  . sulfamethoxazole-trimethoprim (BACTRIM) 400-80 MG tablet Take 1 tablet by mouth 2 (two) times daily. 28 tablet 0  . traMADol (ULTRAM) 50 MG tablet   0   No current facility-administered medications on file prior to visit.     BP 116/76   Pulse 81   Temp (!) 95.6 F (35.3 C) (Temporal)   Resp 16   Ht 6\' 3"  (1.905 m)   Wt 239 lb 12.8 oz (108.8 kg)   SpO2 97%   BMI 29.97 kg/m       Objective:   Physical Exam  General  Mental Status- Alert. General Appearance- Not in acute distress.   Skin General: Color- Normal Color. Moisture- Normal Moisture.  Neck Carotid Arteries- Normal color. Moisture- Normal Moisture. No carotid bruits. No JVD.  Chest and Lung Exam Auscultation: Breath Sounds:-Normal.  Cardiovascular Auscultation:Rythm- Regular. Murmurs & Other Heart Sounds:Auscultation of the heart reveals- No Murmurs.  Abdomen Inspection:-Inspeection Normal. Palpation/Percussion:Note:No mass. Palpation and Percussion of the abdomen reveal- Non Tender, Non Distended + BS, no rebound or guarding.   Neurologic Cranial Nerve exam:- CN III-XII intact(No nystagmus), symmetric smile. Strength:- 5/5 equal and symmetric strength both upper and lower extremities. Upper ext- decreased sensation to both fingers pads. Can't discriminate between sharp and dull. Phalens sign       Assessment & Plan:  Bp well controlled. Continue on current med regimen.  For diabetes, will get cmp and a1c. Follow up with endocrinologist/continue med regimen for diabetes.  For high cholesterol, get lipid panel and continue statin.  For upper ext tingling and symmetric symptoms get xray cspine. Might need referral to neurologist for emg. 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  For fatigue b12, b1, vit D, and tsh.  Follow up date to be determined after lab review.  25 + minutes spent with pt. 50% of time spent counseling on plan going forward.

## 2019-05-08 NOTE — Patient Instructions (Addendum)
Bp well controlled. Continue on current med regimen.  For diabetes, will get cmp and a1c. Follow up with endocrinologist/continue med regimen for diabetes.  For high cholesterol, get lipid panel and continue statin.  For upper ext tingling and symmetric symptoms with faint neck discomfort  get xray cspine. Might need referral to neurologist for emg. 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  For fatigue b12, b1, vit D, and tsh.  Follow up date to be determined after lab review.

## 2019-05-13 LAB — VITAMIN B1: Vitamin B1 (Thiamine): 17 nmol/L (ref 8–30)

## 2019-05-13 LAB — VITAMIN D 1,25 DIHYDROXY
Vitamin D 1, 25 (OH)2 Total: 36 pg/mL (ref 18–72)
Vitamin D2 1, 25 (OH)2: <8 pg/mL
Vitamin D3 1, 25 (OH)2: 36 pg/mL

## 2019-05-26 DIAGNOSIS — L97522 Non-pressure chronic ulcer of other part of left foot with fat layer exposed: Secondary | ICD-10-CM | POA: Diagnosis not present

## 2019-05-26 DIAGNOSIS — M6702 Short Achilles tendon (acquired), left ankle: Secondary | ICD-10-CM | POA: Diagnosis not present

## 2019-05-26 DIAGNOSIS — L97512 Non-pressure chronic ulcer of other part of right foot with fat layer exposed: Secondary | ICD-10-CM | POA: Diagnosis not present

## 2019-05-26 DIAGNOSIS — M6701 Short Achilles tendon (acquired), right ankle: Secondary | ICD-10-CM | POA: Diagnosis not present

## 2019-05-27 MED FILL — METOPROLOL TARTRATE 25 MG T: 25 | 90 days supply | Qty: 180 | Fill #3

## 2019-05-27 MED FILL — CLOPIDOGREL 75 MG TABLET: 75 | 90 days supply | Qty: 90 | Fill #3

## 2019-05-27 MED FILL — ATORVASTATIN 80 MG TABLET: 80 | 90 days supply | Qty: 90 | Fill #3

## 2019-06-15 ENCOUNTER — Other Ambulatory Visit: Payer: Self-pay

## 2019-06-15 ENCOUNTER — Telehealth (INDEPENDENT_AMBULATORY_CARE_PROVIDER_SITE_OTHER): Payer: 59 | Admitting: Thoracic Surgery (Cardiothoracic Vascular Surgery)

## 2019-06-15 DIAGNOSIS — Z951 Presence of aortocoronary bypass graft: Secondary | ICD-10-CM

## 2019-06-15 NOTE — Patient Instructions (Signed)
Continue all previous medications without any changes at this time  You may resume unrestricted physical activity without any particular limitations at this time.  Make every effort to keep your diabetes under very tight control.  Follow up closely with your primary care physician or endocrinologist and strive to keep their hemoglobin A1c levels as low as possible, preferably near or below 6.0.  The long term benefits of strict control of diabetes are far reaching and critically important for your overall health and survival.  Make every effort to stay physically active, get some type of exercise on a regular basis, and stick to a "heart healthy diet".  The long term benefits for regular exercise and a healthy diet are critically important to your overall health and wellbeing.  

## 2019-06-15 NOTE — Progress Notes (Signed)
[address]       [place] [address]             [phone]     CARDIOTHORACIC SURGERY TELEPHONE VIRTUAL OFFICE NOTE  Referring Provider is Patwardhan, Reynold Bowen, MD Primary Cardiologist is Lorretta Harp, MD PCP is Saguier, Iris Pert   HPI:  I spoke with Joshua Branch (DOB April 16, 1961 ) via telephone on 06/15/2019 at 9:33 AM and verified that I was speaking with the correct person using more than one form of identification.  We discussed the reason(s) for conducting our visit virtually instead of in-person.  The patient expressed understanding the circumstances and agreed to proceed as described.   Patient is a 58 year old male with no previous history of coronary artery disease but risk factors notable for history of hypertension, poorly controlled type 2 diabetes, hyperlipidemia, and a family history of coronary artery disease who presented with delayed presentation of acute anterior apical myocardial infarction and underwent coronary artery bypass grafting x4 on June 25, 2018.  [name] early postoperative recovery in the hospital was uneventful and he was discharged home on the fourth postoperative day.  He was last seen here in our office on July 28, 2018 at which time he was doing well.  Since then he has been followed by Dr. Gwenlyn Found.  He never did participate in the cardiac rehab program because of physical restrictions related to his diabetic foot ulcers.  He states that overall he now feels completely recovered from his surgery.  He does note that it took a long time for his stamina to return.  He specifically denies any symptoms of exertional shortness of breath or chest discomfort.  He notes that his foot ulcers were slow to heal until several months ago when he began to pay much closer attention to glycemic control with regards to his underlying diabetes.  Since that time his blood sugars have been under much tighter control and he has lost  weight and he is feeling better.  His diabetic ulcers have essentially healed up completely.  Overall he feels "much improved" in comparison with how he felt prior to surgery.  He is delighted with his overall progress and is taking a much more aggressive role in his overall health and wellbeing.   Current Outpatient Medications  Medication Sig Dispense Refill   aspirin EC 81 MG tablet Take 81 mg by mouth daily.     atorvastatin (LIPITOR) 80 MG tablet Take 1 tablet (80 mg total) by mouth daily at 6 PM. 90 tablet 3   azelastine (ASTELIN) 0.1 % nasal spray Place 2 sprays into both nostrils 2 (two) times daily. Use in each nostril as directed 30 mL 1   Blood Glucose Monitoring Suppl (FREESTYLE LITE) DEVI      canagliflozin (INVOKANA) 100 MG TABS tablet TAKE 1 TABLET (100 MG TOTAL) BY MOUTH DAILY BEFORE BREAKFAST. 90 tablet 3   cetirizine (ZYRTEC) 10 MG tablet Take 1 tablet (10 mg total) by mouth daily. 30 tablet 11   chlorhexidine (PERIDEX) 0.12 % solution      clopidogrel (PLAVIX) 75 MG tablet Take 1 tablet (75 mg total) by mouth daily. 90 tablet 3   glucose blood test strip Use as instructed to check 1-2x a day 100 each 12   Lancets MISC Check sugars 3 times a day E11.9 100 each 0   lisinopril (ZESTRIL) 10 MG tablet TAKE 1 TABLET (10 MG TOTAL) BY MOUTH DAILY.  90 tablet 3   metFORMIN (GLUCOPHAGE) 500 MG tablet Take 2 tablets (1,000 mg total) by mouth 2 (two) times daily with a meal. 360 tablet 3   metoprolol tartrate (LOPRESSOR) 25 MG tablet Take 1 tablet (25 mg total) by mouth 2 (two) times daily. 180 tablet 3   sildenafil (REVATIO) 20 MG tablet 2-5 tab po prn  one hour before sex.(max 100 mg in 24 hours period. 100 tablet 0   sildenafil (VIAGRA) 50 MG tablet Take 1 tablet (50 mg total) by mouth daily as needed for erectile dysfunction. 10 tablet 0   sulfamethoxazole-trimethoprim (BACTRIM) 400-80 MG tablet Take 1 tablet by mouth 2 (two) times daily. 28 tablet 0   traMADol  (ULTRAM) 50 MG tablet   0   No current facility-administered medications for this visit.      Diagnostic Tests:  n/a   Impression:  Patient is doing very well approximately 1 year status post coronary artery bypass grafting  Plan:  We have not recommended any change the patient's current medications.  I have encouraged the patient to continue to increase his activity.  We discussed the many benefits of regular exercise and a heart healthy diet.  We discussed benefits of tight glycemic control and close attention to long-term management of his diabetes.  We discussed lipids and associated medical therapy.  All of his questions have been addressed.  He will continue to follow-up with Dr. Gwenlyn Found and call to return to see [place] here in the future only should specific problems or questions arise.    I discussed limitations of evaluation and management via telephone.  The patient was advised to call back for repeat telephone consultation or to seek an in-person evaluation if questions arise or the patient's clinical condition changes in any significant manner.  I spent in excess of 10 minutes of non-face-to-face time during the conduct of this telephone virtual office consultation.     Valentina Gu. Roxy Manns, MD 06/15/2019 9:33 AM

## 2019-06-30 DIAGNOSIS — M6702 Short Achilles tendon (acquired), left ankle: Secondary | ICD-10-CM | POA: Diagnosis not present

## 2019-06-30 DIAGNOSIS — M6701 Short Achilles tendon (acquired), right ankle: Secondary | ICD-10-CM | POA: Diagnosis not present

## 2019-06-30 DIAGNOSIS — E11621 Type 2 diabetes mellitus with foot ulcer: Secondary | ICD-10-CM | POA: Diagnosis not present

## 2019-06-30 DIAGNOSIS — S92514A Nondisplaced fracture of proximal phalanx of right lesser toe(s), initial encounter for closed fracture: Secondary | ICD-10-CM | POA: Diagnosis not present

## 2019-06-30 DIAGNOSIS — L97402 Non-pressure chronic ulcer of unspecified heel and midfoot with fat layer exposed: Secondary | ICD-10-CM | POA: Diagnosis not present

## 2019-06-30 MED FILL — FREESTYLE LITE TEST STRIP: 50 days supply | Qty: 100 | Fill #2

## 2019-06-30 MED FILL — LISINOPRIL 10 MG TABS: 10 | 90 days supply | Qty: 90 | Fill #0

## 2019-06-30 MED FILL — CEPHALEXIN 500 MG CAPSULE: 500 | 10 days supply | Qty: 20 | Fill #0

## 2019-07-10 ENCOUNTER — Other Ambulatory Visit: Payer: Self-pay

## 2019-07-10 DIAGNOSIS — C4491 Basal cell carcinoma of skin, unspecified: Secondary | ICD-10-CM

## 2019-07-10 DIAGNOSIS — C4441 Basal cell carcinoma of skin of scalp and neck: Secondary | ICD-10-CM | POA: Diagnosis not present

## 2019-07-10 HISTORY — DX: Basal cell carcinoma of skin, unspecified: C44.91

## 2019-07-16 ENCOUNTER — Other Ambulatory Visit: Payer: Self-pay | Admitting: Internal Medicine

## 2019-07-16 MED FILL — INVOKANA 100 MG TABLET: 100 | 90 days supply | Qty: 90 | Fill #3

## 2019-07-17 MED FILL — metFORMIN HCL 500 MG TABS: 500 | 90 days supply | Qty: 360 | Fill #0

## 2019-07-20 ENCOUNTER — Ambulatory Visit: Payer: Self-pay | Admitting: Medical

## 2019-07-20 ENCOUNTER — Encounter: Payer: Self-pay | Admitting: Medical

## 2019-07-20 NOTE — Telephone Encounter (Signed)
Patient states that he had a temp of 101 on Friday and Sunday. He is requesting call back from RN to discuss if he needs to get tested for covid. Please advise.      Pt stated that he had a temp of 101 on Saturday and Sunday and today his temp is 96. He dose not have any other symptoms. Wanted to know if he should get tested for covid. Advised probably should because you can not have any symptoms or one or two and be positive.  He wanted to know where in [facility] and if he needed a doctor's order. He was advised that this would be sent to his provider for recommendations.  He is requesting a call back. Notified LB at [place] to the practice.

## 2019-07-20 NOTE — Telephone Encounter (Addendum)
Patient wants to know if he needs to go get tested for covid. Siince he has heart issues.  He feels fine now, but had been running fever on Saturday and Sunday.      Can you give pt number to test center. Have him call and get scheduled. He had fever recently. Did he have any covid type symptoms.  It he does not want to go to [facility] then can get test [facility] or local urgent care. If he has acute illness type symptoms offer appointment with me.  Notify pt I won't be in office wed, Thursday or Friday so can see him virtually if needed tomorrow. If he gets test for covid outside of [facility] instruct pt to notify me by calling office or my chart when he gets results.

## 2019-07-21 NOTE — Telephone Encounter (Signed)
See phone note

## 2019-07-21 NOTE — Telephone Encounter (Signed)
Patient stated that he has already made arrangements to get tested.

## 2019-08-04 DIAGNOSIS — L97522 Non-pressure chronic ulcer of other part of left foot with fat layer exposed: Secondary | ICD-10-CM | POA: Diagnosis not present

## 2019-08-04 DIAGNOSIS — L97512 Non-pressure chronic ulcer of other part of right foot with fat layer exposed: Secondary | ICD-10-CM | POA: Diagnosis not present

## 2019-08-12 ENCOUNTER — Other Ambulatory Visit: Payer: Self-pay | Admitting: Cardiovascular Disease

## 2019-08-17 ENCOUNTER — Encounter: Payer: Self-pay | Admitting: Medical

## 2019-08-21 ENCOUNTER — Other Ambulatory Visit: Payer: Self-pay

## 2019-08-21 ENCOUNTER — Other Ambulatory Visit: Payer: Self-pay | Admitting: Cardiovascular Disease

## 2019-08-21 ENCOUNTER — Ambulatory Visit (HOSPITAL_BASED_OUTPATIENT_CLINIC_OR_DEPARTMENT_OTHER)
Admission: RE | Admit: 2019-08-21 | Discharge: 2019-08-21 | Disposition: A | Discharge status: Home / Self Care | Payer: 59 | Source: Ambulatory Visit | Attending: Medical | Admitting: Medical

## 2019-08-21 DIAGNOSIS — M542 Cervicalgia: Secondary | ICD-10-CM | POA: Diagnosis not present

## 2019-08-21 DIAGNOSIS — M47812 Spondylosis without myelopathy or radiculopathy, cervical region: Secondary | ICD-10-CM | POA: Diagnosis not present

## 2019-08-21 MED ORDER — METOPROLOL TARTRATE 25 MG PO TABS: 25.0000 mg | ORAL_TABLET | Freq: Two times a day (BID) | ORAL | 3 refills | Status: DC

## 2019-08-21 MED ORDER — CLOPIDOGREL BISULFATE 75 MG PO TABS: 75.0000 mg | ORAL_TABLET | Freq: Every day | ORAL | 3 refills | Status: DC

## 2019-08-21 MED ORDER — ATORVASTATIN CALCIUM 80 MG PO TABS: 80.0000 mg | ORAL_TABLET | Freq: Every day | ORAL | 3 refills | Status: DC

## 2019-08-21 MED FILL — ATORVASTATIN 80 MG TABLET: 80 | 90 days supply | Qty: 90 | Fill #0

## 2019-08-21 MED FILL — CLOPIDOGREL 75 MG TABLET: 75 | 90 days supply | Qty: 90 | Fill #0

## 2019-08-21 MED FILL — METOPROLOL TARTRATE 25 MG T: 25 | 90 days supply | Qty: 180 | Fill #0

## 2019-08-21 NOTE — Addendum Note (Signed)
Addended by: Therisa Doyne on: 08/21/2019 03:12 PM   Modules accepted: Orders

## 2019-08-21 NOTE — Telephone Encounter (Signed)
Rx request sent to pharmacy.  

## 2019-08-21 NOTE — Telephone Encounter (Signed)
*  STAT* If patient is at the pharmacy, call can be transferred to refill team.   1. Which medications need to be refilled? (please list name of each medication and dose if known)  clopidogrel (PLAVIX) 75 MG tablet atorvastatin (LIPITOR) 80 MG tablet  metoprolol tartrate (LOPRESSOR) 25 MG tablet    2. Which pharmacy/location (including street and city if local pharmacy) is medication to be sent to? [place], [place] - [place]  3. Do they need a 30 day or 90 day supply? 90 day

## 2019-08-21 NOTE — Telephone Encounter (Signed)
Meds have already been ordered and sent to pharmacy on 08/12/19.

## 2019-09-03 DIAGNOSIS — C4441 Basal cell carcinoma of skin of scalp and neck: Secondary | ICD-10-CM | POA: Diagnosis not present

## 2019-09-14 DIAGNOSIS — L97522 Non-pressure chronic ulcer of other part of left foot with fat layer exposed: Secondary | ICD-10-CM | POA: Diagnosis not present

## 2019-09-14 DIAGNOSIS — L97512 Non-pressure chronic ulcer of other part of right foot with fat layer exposed: Secondary | ICD-10-CM | POA: Diagnosis not present

## 2019-10-21 ENCOUNTER — Other Ambulatory Visit: Payer: Self-pay | Admitting: Internal Medicine

## 2019-10-21 MED FILL — METFORMIN HCL 500 MG TABS: 500 | 90 days supply | Qty: 360 | Fill #1

## 2019-10-21 MED FILL — LISINOPRIL 10 MG TABS: 10 | 90 days supply | Qty: 90 | Fill #1

## 2019-10-21 NOTE — Telephone Encounter (Signed)
Last office visit 03-20-2019  Cancel/No-show? No  Future office visit scheduled? No  Please advise on refill.

## 2019-10-21 NOTE — Telephone Encounter (Signed)
We can only refill this if he is scheduling an appointment.  Otherwise, refills per PCP.  I have not seen him in a while.

## 2019-10-23 ENCOUNTER — Ambulatory Visit: Payer: 59 | Attending: Internal Medicine

## 2019-10-23 DIAGNOSIS — Z23 Encounter for immunization: Secondary | ICD-10-CM

## 2019-10-23 NOTE — Progress Notes (Signed)
   Covid-19 Vaccination Clinic  Name:  Joshua Branch    MRN: [id number] DOB: 03-08-1961  10/23/2019  Joshua Branch was observed post Covid-19 immunization for 15 minutes without incident. He was provided with Vaccine Information Sheet and instruction to access the V-Safe system.   Joshua Branch was instructed to call 911 with any severe reactions post vaccine: Marland Kitchen Difficulty breathing  . Swelling of face and throat  . A fast heartbeat  . A bad rash all over body  . Dizziness and weakness   Immunizations Administered    Name Date Dose VIS Date Route   Pfizer COVID-19 Vaccine 10/23/2019  8:46 AM 0.3 mL 07/10/2019 Intramuscular   Manufacturer: [place]   Lot: [id number]   [place]: [id number]

## 2019-10-26 ENCOUNTER — Telehealth: Payer: Self-pay | Admitting: Internal Medicine

## 2019-10-26 MED ORDER — CANAGLIFLOZIN 100 MG PO TABS: ORAL_TABLET | ORAL | 3 refills | Status: DC

## 2019-10-26 MED FILL — INVOKANA 100 MG TABLET: 100 | 90 days supply | Qty: 90 | Fill #0

## 2019-10-26 NOTE — Telephone Encounter (Signed)
RX sent

## 2019-10-26 NOTE — Telephone Encounter (Signed)
MEDICATION: invokana  PHARMACY:   [place], [place] Phone:  [phone]  Fax:  [phone]       IS THIS A 90 DAY SUPPLY : yes  IS PATIENT OUT OF MEDICATION: yes  IF NOT; HOW MUCH IS LEFT:   LAST APPOINTMENT DATE: @3 /24/2021  NEXT APPOINTMENT DATE:@4 /16/2021  DO WE HAVE YOUR PERMISSION TO LEAVE A DETAILED MESSAGE: yes  OTHER COMMENTS:    **Let patient know to contact pharmacy at the end of the day to make sure medication is ready. **  ** Please notify patient to allow 48-72 hours to process**  **Encourage patient to contact the pharmacy for refills or they can request refills through [facility]**

## 2019-10-26 NOTE — Addendum Note (Signed)
Addended by: Cardell Peach I on: 10/26/2019 01:25 PM   Modules accepted: Orders

## 2019-11-13 ENCOUNTER — Encounter: Payer: Self-pay | Admitting: Internal Medicine

## 2019-11-13 ENCOUNTER — Telehealth (INDEPENDENT_AMBULATORY_CARE_PROVIDER_SITE_OTHER): Payer: 59 | Admitting: Internal Medicine

## 2019-11-13 ENCOUNTER — Other Ambulatory Visit: Payer: Self-pay

## 2019-11-13 DIAGNOSIS — E1165 Type 2 diabetes mellitus with hyperglycemia: Secondary | ICD-10-CM | POA: Diagnosis not present

## 2019-11-13 DIAGNOSIS — E782 Mixed hyperlipidemia: Secondary | ICD-10-CM

## 2019-11-13 DIAGNOSIS — R200 Anesthesia of skin: Secondary | ICD-10-CM | POA: Diagnosis not present

## 2019-11-13 DIAGNOSIS — E1159 Type 2 diabetes mellitus with other circulatory complications: Secondary | ICD-10-CM

## 2019-11-13 NOTE — Progress Notes (Signed)
Patient ID: DIVON TEAGLE, male   DOB: 18-May-1961, 59 y.o.   MRN: [id number]   Patient location: Work My location: Office Persons participating in the virtual visit: patient, provider  Referring Provider: Saguier, Percell Miller, PA-C  I connected with the patient on 11/13/19 at 10:26 AM EDT by a video enabled telemedicine application and verified that I am speaking with the correct person.   I discussed the limitations of evaluation and management by telemedicine and the availability of in person appointments. The patient expressed understanding and agreed to proceed.   Details of the encounter are shown below.  HPI: FLAVEL COSTNER is a 59 y.o.-year-old male, presenting for follow-up for DM2, dx before 2017, non-insulin-dependent, uncontrolled, with long-term complications (CAD - s/p AMI - s/p CABG x4 06/2018, nonhealing skin ulcer).  Last visit 77months ago (virtual).  He was referred to endocrinology after he had an acute MI in 05/2018, followed by a CABG x4.  Reviewed his HbA1c levels: Lab Results  Component Value Date   HGBA1C 7.8 (H) 05/08/2019   HGBA1C 8.2 (H) 06/23/2018   HGBA1C 11.7 (H) 01/17/2018   HGBA1C 10.8 (H) 12/07/2016   HGBA1C 9.2 (H) 11/30/2015   He started to change his diet in 02/2018: He eliminated fast foods, eats less sweets, less alcohol and sugars improved significantly.  He is on: - Metformin 1000 mg 2x a day, with meals - started 12/2017, increased 06/2018 - Jardiance 10 mg in a.m.  >> abd. Pain, nausea, diarrhea >> Invokana 100 mg daily -tolerates this well  Pt checks his sugars once a day:  - am: 155-165 >> 102-135 >> 120s >> 90s-125 - 2h after b'fast: 130-160 >> 140-145 >> 130-175 - before lunch:  - 2h after lunch: n/c - before dinner: n/c - 2h after dinner:  130-160 >> 140-150 >> up to 175 - bedtime: n/c - nighttime: n/c Lowest sugar was 115-120 >> 102 >> 107 >> 95; it is unclear at which level he has hypoglycemia awareness Highest sugar was 204 >>  185 >> 181 >> 175.  Glucometer: True Metrix  Pt's meals are: - Breakfast: protein shake, smoothie, yoghurt, occas. Eggs and bacon - Lunch: sandwich, chips - Dinner: chicken, veggie - Snacks: 3 a day  -No CKD, last BUN/creatinine:  Lab Results  Component Value Date   BUN 16 05/08/2019   BUN 18 06/28/2018   CREATININE 0.88 05/08/2019   CREATININE 0.91 06/28/2018  On lisinopril 10.  -+ HL; last set of lipids: Lab Results  Component Value Date   CHOL 113 05/08/2019   HDL 33.80 (L) 05/08/2019   LDLCALC 53 05/08/2019   LDLDIRECT 93.0 01/17/2018   TRIG 131.0 05/08/2019   CHOLHDL 3 05/08/2019  On Lipitor 80.  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 [facility] Rex [facility].  -+ Numbness and tingling in his feet and numbness in fingertips  On ASA 81.  Pt has FH of DM in mother, sister.  He continues to have numbness in fingers.  B12 was normal: Lab Results  Component Value Date   VITAMINB12 236 05/08/2019   VITAMINB12 642 07/15/2018  Not on a B12 supplement.  ROS: Constitutional: no weight gain/no weight loss, no fatigue, no subjective hyperthermia, no subjective hypothermia Eyes: no blurry vision, no xerophthalmia ENT: no sore throat, no nodules palpated in neck, no dysphagia, no odynophagia, no hoarseness Cardiovascular: no CP/no SOB/no palpitations/no leg swelling Respiratory: no cough/no SOB/no wheezing Gastrointestinal: no N/no V/no D/no C/no  acid reflux Musculoskeletal: no muscle aches/no joint aches Skin: no rashes, no hair loss Neurological: no tremors/+ numbness/+ tingling/no dizziness  I reviewed pt's medications, allergies, PMH, social hx, family hx, and changes were documented in the history of present illness. Otherwise, unchanged from my initial visit note.  Past Medical History:  Diagnosis Date  . Acute myocardial infarction ([place]) 06/22/2018  . Allergy   . Charcot's joint of left foot, non-diabetic   . Coronary artery  disease involving native coronary artery with unstable angina pectoris ([place]) 06/22/2018  . Diabetes ([place])    type 2  . Hyperlipidemia   . Hypertension   . Neuropathy of left foot   . Obstructive sleep apnea   . Right foot ulcer ([place])   . 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 Type II diabetes mellitus with complication, uncontrolled ([place])   . Uncontrolled type 2 diabetes mellitus ([place])    Past Surgical History:  Procedure Laterality Date  . CORONARY ARTERY BYPASS GRAFT N/A 06/25/2018   Procedure: CORONARY ARTERY BYPASS GRAFTING (CABG) TIMES FOUR USING LEFT INTERNAL MAMMARY ARTERY AND RIGHT ENDOSCOPICALLY HARVESTED SAPHENOUS VEIN;  Surgeon: Rexene Alberts, MD;  Location: [facility];  Service: Open Heart Surgery;  Laterality: N/A;  . ENDOVEIN HARVEST OF GREATER SAPHENOUS VEIN Right 06/25/2018   Procedure: ENDOVEIN HARVEST OF GREATER SAPHENOUS VEIN;  Surgeon: Rexene Alberts, MD;  Location: [facility];  Service: Open Heart Surgery;  Laterality: Right;  . LEFT HEART CATH AND CORONARY ANGIOGRAPHY N/A 06/22/2018   Procedure: LEFT HEART CATH AND CORONARY ANGIOGRAPHY;  Surgeon: Nigel Mormon, MD;  Location: [facility] CV LAB;  Service: Cardiovascular;  Laterality: N/A;  . surgical debridement  Right    Right foot ulcer  . TEE WITHOUT CARDIOVERSION N/A 06/25/2018   Procedure: TRANSESOPHAGEAL ECHOCARDIOGRAM (TEE);  Surgeon: Rexene Alberts, MD;  Location: [facility];  Service: Open Heart Surgery;  Laterality: N/A;   Social History   Socioeconomic History  . Marital status: Married    Spouse name: [name]  . Number of children: 2  . Years of education: Not on file  . Highest education level: Professional school degree (e.g., MD, DDS, DVM, JD)  Occupational History  . Occupation: attorney  Tobacco Use  . Smoking status: Never Smoker  . Smokeless tobacco: Never Used  Substance and Sexual Activity  . Alcohol use: Yes     Alcohol/week: 2.0 standard drinks    Types: 2 Cans of beer per week    Comment: 2 cans of beer a month  . Drug use: Never  . Sexual activity: Not on file  Other Topics Concern  . Not on file  Social History Narrative  . Not on file   Social Determinants of Health   Financial Resource Strain:   . Difficulty of Paying Living Expenses:   Food Insecurity:   . Worried About Charity fundraiser in the Last Year:   . Arboriculturist in the Last Year:   Transportation Needs:   . Film/video editor (Medical):   Marland Kitchen Lack of Transportation (Non-Medical):   Physical Activity:   . Days of Exercise per Week:   . Minutes of Exercise per Session:   Stress:   . Feeling of Stress :   Social Connections:   . Frequency of Communication with Friends and Family:   . Frequency of Social Gatherings  with Friends and Family:   . Attends Religious Services:   . Active Member of Clubs or Organizations:   . Attends Archivist Meetings:   Marland Kitchen Marital Status:   Intimate Partner Violence:   . Fear of Current or Ex-Partner:   . Emotionally Abused:   Marland Kitchen Physically Abused:   . Sexually Abused:    Current Outpatient Medications on File Prior to Visit  Medication Sig Dispense Refill  . atorvastatin (LIPITOR) 80 MG tablet Take 1 tablet (80 mg total) by mouth daily at 6 PM. 90 tablet 3  . azelastine (ASTELIN) 0.1 % nasal spray Place 2 sprays into both nostrils 2 (two) times daily. Use in each nostril as directed 30 mL 1  . Blood Glucose Monitoring Suppl (FREESTYLE LITE) DEVI     . canagliflozin (INVOKANA) 100 MG TABS tablet TAKE 1 TABLET (100 MG TOTAL) BY MOUTH DAILY BEFORE BREAKFAST. 90 tablet 3  . cetirizine (ZYRTEC) 10 MG tablet Take 1 tablet (10 mg total) by mouth daily. 30 tablet 11  . clopidogrel (PLAVIX) 75 MG tablet Take 1 tablet (75 mg total) by mouth daily. 90 tablet 3  . glucose blood test strip Use as instructed to check 1-2x a day 100 each 12  . Lancets MISC Check sugars 3 times a day  E11.9 100 each 0  . lisinopril (ZESTRIL) 10 MG tablet TAKE 1 TABLET (10 MG TOTAL) BY MOUTH DAILY. 90 tablet 3  . metFORMIN (GLUCOPHAGE) 500 MG tablet Take 2 tablets (1,000 mg total) by mouth 2 (two) times daily with a meal. Please schedule follow up 360 tablet 1  . metoprolol tartrate (LOPRESSOR) 25 MG tablet Take 1 tablet (25 mg total) by mouth 2 (two) times daily. 180 tablet 3  . sildenafil (REVATIO) 20 MG tablet 2-5 tab po prn  one hour before sex.(max 100 mg in 24 hours period. 100 tablet 0  . sildenafil (VIAGRA) 50 MG tablet Take 1 tablet (50 mg total) by mouth daily as needed for erectile dysfunction. 10 tablet 0   No current facility-administered medications on file prior to visit.   Allergies  Allergen Reactions  . Apricot Kernel Oil [Prunus] Swelling    THROAT  . Cherry Swelling    THROAT  . Jardiance [Empagliflozin] Diarrhea and Nausea And Vomiting  . Other Other (See Comments)    Fruits with a pit. Throat swells up. Can have them if cooked   . Peach [Prunus Persica] Swelling    THROAT  . Plum Pulp Swelling    THROAT   Family History  Problem Relation Age of Onset  . Diabetes Mother   . Stroke Father   . Hypertension Father   . Hearing loss Father   . Colon polyps Father   . Colon cancer Neg Hx   . Esophageal cancer Neg Hx   . Prostate cancer Neg Hx    PE: There were no vitals taken for this visit. Wt Readings from Last 3 Encounters:  05/08/19 239 lb 12.8 oz (108.8 kg)  11/21/18 230 lb (104.3 kg)  10/03/18 240 lb (108.9 kg)   Constitutional:  in NAD  The physical exam was not performed (virtual visit).  ASSESSMENT: 1. DM2, non-insulin-dependent, uncontrolled, with long-term complications - CAD - s/p AMI - s/p CABG x4 06/2018-sees Dr. Gwenlyn Found - nonhealing skin ulcer  2.  Numbness in fingers  3. HL  PLAN:  1. Patient with history of longstanding, uncontrolled, type 2 diabetes, on oral antidiabetic regimen with Metformin and SGLT2  inhibitor.  He has a  significant history of coronary artery disease, status post MI, for which he had to have CABG.  After this, we tried to start Jardiance and stopped his daily HCTZ, however, he developed abdominal pain from [place] and we decided to try Invokana.  Fortunately, he tolerates this well, without any symptoms.  His sugars improved significantly after he started to change his diet in 2019 by cutting down fast foods, sweets, alcohol.  HbA1c improved from 11.7% to 8.2% and, at last visit, 7.8%, in 04/2019.  I have not seen him in 8 months, but at last visit, sugars were still very good, with fasting at the upper limit of normal  -at that time, we discussed about possible reasons for this. -he had 3 ulcers on both feet >> improved.  Per review of the chart, they grew strep agalactiae.  We will need to stop Invokana if these worsen again. -At this visit, sugars remain at goal, much better if he controls his diet.  We did discuss about several changes in his diet that will improve his sugars.  I suggested a more plant-based diet and given specific references.  Discussed about benefits of a plant-based diet. -Otherwise, I do not feel we need to change his regimen for now - I suggested to:  Patient Instructions  Please continue: - Metformin 1000 mg 2x a day - Invokana 100 mg before breakfast  Please start B12 1000 mcg daily.  Read the following book: Dr. Alyssa Grove - Program for Reversing Diabetes  Please return in 3-4 months with your sugar log.   - we will recheck HbA1c at next OV with PCP for at our next visit - advised to check sugars at different times of the day - 1x a day, rotating check times - advised for yearly eye exams >> he is UTD - return to clinic in 3-4 months   2.  Numbness in fingertips -B12 level was normal at last visit, but in 04/2019 had another checked and the level was still normal but very low in the normal range -He is not on B12 vitamin and his symptoms persist -I advised  him to start 1000 mcg B12 daily  3. HL -Reviewed latest lipid panel from 04/2019: Excellent, with the exception of a slightly low HDL Lab Results  Component Value Date   CHOL 113 05/08/2019   HDL 33.80 (L) 05/08/2019   LDLCALC 53 05/08/2019   LDLDIRECT 93.0 01/17/2018   TRIG 131.0 05/08/2019   CHOLHDL 3 05/08/2019  -Continues high-dose Lipitor without side effects. -Switching to a more plant-based diet, will also help his lipid panel  Philemon Kingdom, MD PhD [facility] Endocrinology

## 2019-11-13 NOTE — Patient Instructions (Signed)
Please continue: - Metformin 1000 mg 2x a day - Invokana 100 mg before breakfast  Please start B12 1000 mcg daily.  Read the following book: Dr. Alyssa Grove - Program for Reversing Diabetes  Please return in 3-4 months with your sugar log.

## 2019-11-17 ENCOUNTER — Ambulatory Visit: Payer: 59 | Attending: Internal Medicine

## 2019-11-17 DIAGNOSIS — Z23 Encounter for immunization: Secondary | ICD-10-CM

## 2019-11-17 NOTE — Progress Notes (Signed)
   Covid-19 Vaccination Clinic  Name:  Joshua Branch    MRN: [id number] DOB: Dec 30, 1960  11/17/2019  [name] was observed post Covid-19 immunization for 15 minutes without incident. He was provided with Vaccine Information Sheet and instruction to access the V-Safe system.   Mr. Reasons was instructed to call 911 with any severe reactions post vaccine: Marland Kitchen Difficulty breathing  . Swelling of face and throat  . A fast heartbeat  . A bad rash all over body  . Dizziness and weakness   Immunizations Administered    Name Date Dose VIS Date Route   Pfizer COVID-19 Vaccine 11/17/2019  8:50 AM 0.3 mL 09/23/2018 Intramuscular   Manufacturer: [place]   Lot: [id number]   [place]: [id number]

## 2019-11-18 MED FILL — CLOPIDOGREL 75 MG TABLET: 75 | 90 days supply | Qty: 90 | Fill #1

## 2019-11-18 MED FILL — METOPROLOL TARTRATE 25 MG T: 25 | 90 days supply | Qty: 180 | Fill #1

## 2019-11-18 MED FILL — ATORVASTATIN 80 MG TABLET: 80 | 90 days supply | Qty: 90 | Fill #1

## 2019-12-23 ENCOUNTER — Inpatient Hospital Stay: Admission: RE | Admit: 2019-12-23 | Payer: 59 | Source: Ambulatory Visit

## 2019-12-23 ENCOUNTER — Encounter: Payer: Self-pay | Admitting: Medical

## 2019-12-24 ENCOUNTER — Other Ambulatory Visit: Payer: Self-pay

## 2019-12-24 ENCOUNTER — Telehealth (INDEPENDENT_AMBULATORY_CARE_PROVIDER_SITE_OTHER): Payer: 59 | Admitting: Medical

## 2019-12-24 ENCOUNTER — Encounter: Payer: Self-pay | Admitting: Medical

## 2019-12-24 ENCOUNTER — Telehealth: Payer: Self-pay | Admitting: *Deleted

## 2019-12-24 ENCOUNTER — Ambulatory Visit (HOSPITAL_BASED_OUTPATIENT_CLINIC_OR_DEPARTMENT_OTHER)
Admission: RE | Admit: 2019-12-24 | Discharge: 2019-12-24 | Disposition: A | Discharge status: Home / Self Care | Payer: 59 | Source: Ambulatory Visit | Attending: Medical | Admitting: Medical

## 2019-12-24 VITALS — BP 121/79 | HR 94 | Temp 99.0°F

## 2019-12-24 DIAGNOSIS — L089 Local infection of the skin and subcutaneous tissue, unspecified: Secondary | ICD-10-CM | POA: Diagnosis not present

## 2019-12-24 DIAGNOSIS — L97519 Non-pressure chronic ulcer of other part of right foot with unspecified severity: Secondary | ICD-10-CM

## 2019-12-24 LAB — CBC WITH DIFFERENTIAL/PLATELET
Absolute Monocytes: 1163 cells/uL — ABNORMAL HIGH (ref 200–950)
Basophils Absolute: 37 cells/uL (ref 0–200)
Basophils Relative: 0.4 %
Eosinophils Absolute: 74 cells/uL (ref 15–500)
Eosinophils Relative: 0.8 %
HCT: 40 % (ref 38.5–50.0)
Hemoglobin: 13.8 g/dL (ref 13.2–17.1)
Lymphs Abs: 1125 cells/uL (ref 850–3900)
MCH: 30.5 pg (ref 27.0–33.0)
MCHC: 34.5 g/dL (ref 32.0–36.0)
MCV: 88.3 fL (ref 80.0–100.0)
MPV: 9.2 fL (ref 7.5–12.5)
Monocytes Relative: 12.5 %
Neutro Abs: 6901 cells/uL (ref 1500–7800)
Neutrophils Relative %: 74.2 %
Platelets: 313 10*3/uL (ref 140–400)
RBC: 4.53 10*6/uL (ref 4.20–5.80)
RDW: 12.5 % (ref 11.0–15.0)
Total Lymphocyte: 12.1 %
WBC: 9.3 10*3/uL (ref 3.8–10.8)

## 2019-12-24 MED ORDER — CEFTRIAXONE SODIUM 1 G IJ SOLR
1.0000 g | Freq: Once | INTRAMUSCULAR | Status: AC
  Administered 2019-12-24: 1 g via INTRAMUSCULAR

## 2019-12-24 MED ORDER — SULFAMETHOXAZOLE-TRIMETHOPRIM 800-160 MG PO TABS: 1.0000 | ORAL_TABLET | Freq: Two times a day (BID) | ORAL | 0 refills | Status: DC

## 2019-12-24 MED FILL — SULFAMETHOXAZOLE-TMP DS TAB: 800-160 | 10 days supply | Qty: 20 | Fill #0

## 2019-12-24 NOTE — Patient Instructions (Addendum)
For toe infection and possible infected ulcer will get cbc stat today. Also have staff(MA or RN) do wound culture of ulcer when he comes in. Start bactrim ds antibiotic today.  Ask pt to call his podiatrist today and see if they will see him next Tuesday. If not able to see them then schedule with me on Tuesday.  Xray of foot today.  If worsens over the weekend then ED.   Follow up Tuesday podiatrist, with me if podiatrist can't see or as needed ED over upcoming holiday.

## 2019-12-24 NOTE — Addendum Note (Signed)
Addended by: Kem Boroughs D on: 12/24/2019 05:17 PM   Modules accepted: Orders

## 2019-12-24 NOTE — Telephone Encounter (Signed)
Attempted to reach pt regarding need to complete labs and wound culture. Mailbox full. Unable to leave message. Sent message via mychart.

## 2019-12-24 NOTE — Telephone Encounter (Signed)
Attempted to reach pt again. Went to [employer] is full.

## 2019-12-24 NOTE — Addendum Note (Signed)
Addended by: Kelle Darting A on: 12/24/2019 04:29 PM   Modules accepted: Orders

## 2019-12-24 NOTE — Telephone Encounter (Signed)
Labs and wound culture completed.

## 2019-12-24 NOTE — Progress Notes (Addendum)
   Subjective:    Patient ID: Joshua Branch, male    DOB: 04/20/1961, 59 y.o.   MRN: [id number]  HPI  Virtual Visit via Video Note  I connected with AMPARO Branch on 12/24/19 at  9:00 AM EDT by a video enabled telemedicine application and verified that I am speaking with the correct person using two identifiers.  Location: Patient: home Provider: office Persons participating in visit: Mr. Joshua Branch and Mackie Pai PA-C   I discussed the limitations of evaluation and management by telemedicine and the availability of in person appointments. The patient expressed understanding and agreed to proceed.  History of Present Illness:  Pt states about one week ago he started to feel lethargic and lost appetite. He went to beach this weekend. Felt fine on weekend. Monday he felt exhausted. He had fever 102.1. He slept a lot on Monday. Tuesday and Wednesday 100-101 fever. No body aches, no cough. No loss of smell.  Pt states rt second started to get red on Sunday. Top distal half of foot swollen. Pt has ulcer on bottom of his foot. It is a lot smaller. 2 years ago size of half dollar. Now only dime size. States as far as he can remember has had water discharge in am. Sometimes creamy yellow dc.    Observations/Objective:  General-no acute distress, pleasant, oriented. Lungs- on inspection lungs appear unlabored. Neck- no tracheal deviation or jvd on inspection. Neuro- gross motor function appears intact. Foot on video- bottom of foot. Has fairly wide ulcer.(no dc presently) 2nd toe swollen and red(no lesions seen. No dark areas). Pt states no pain. Sensation intact.    Assessment and Plan:  For toe infection and possible infected ulcer will get cbc stat today. Also have staff(MA or RN) do wound culture of ulcer when he comes in. Start bactrim ds antibiotic today.  Ask pt to call his podiatrist today and see if they will see him next Tuesday. If not able to see them then  schedule with me on Tuesday.  Xray of foot today.  If worsens over the weekend then ED.   Follow up Tuesday podiatrist, with me if podiatrist can't see or as needed ED over upcoming holiday.     Mackie Pai, PA-C   Time spent with patient today was 35  minutes which consisted of chart review, discussing diagnosis, work up,  treatment and documentation.  Follow Up Instructions:    I discussed the assessment and treatment plan with the patient. The patient was provided an opportunity to ask questions and all were answered. The patient agreed with the plan and demonstrated an understanding of the instructions.   The patient was advised to call back or seek an in-person evaluation if the symptoms worsen or if the condition fails to improve as anticipated.     Mackie Pai, PA-C    Review of Systems     Objective:   Physical Exam        Assessment & Plan:

## 2019-12-25 ENCOUNTER — Telehealth: Payer: Self-pay

## 2019-12-25 NOTE — Telephone Encounter (Signed)
-----   Message from Mackie Pai, PA-C sent at 12/24/2019  5:34 PM EDT ----- Your have extensive soft tissue swelling  of the second digit with bone reaction at base of second toe. Radiologist states this raises concern for developing osteomyelitis. Also states questionable foreign body in soft tissue which I am not sure of. Did you call the podiatrist office and get appointment for this Tuesday. Please give me update if you got that appointment. Very important that you see them on Tuesday. If more swelling, discoloration to skin, more pain, fevers or chills then recommend ED evaluation over the weekend.

## 2019-12-25 NOTE — Telephone Encounter (Signed)
Attempted to contact patient regarding results, VM full. Will send MyChart message.

## 2019-12-25 NOTE — Telephone Encounter (Signed)
Attempted to contact patient, VM full. Patient did read mychart message from this morning.

## 2019-12-28 LAB — WOUND CULTURE
MICRO NUMBER:: 10527395
SPECIMEN QUALITY:: ADEQUATE

## 2019-12-30 ENCOUNTER — Telehealth: Payer: Self-pay

## 2019-12-30 DIAGNOSIS — E11621 Type 2 diabetes mellitus with foot ulcer: Secondary | ICD-10-CM | POA: Diagnosis not present

## 2019-12-30 DIAGNOSIS — L03115 Cellulitis of right lower limb: Secondary | ICD-10-CM | POA: Diagnosis not present

## 2019-12-30 DIAGNOSIS — L97514 Non-pressure chronic ulcer of other part of right foot with necrosis of bone: Secondary | ICD-10-CM | POA: Diagnosis not present

## 2019-12-30 DIAGNOSIS — L97522 Non-pressure chronic ulcer of other part of left foot with fat layer exposed: Secondary | ICD-10-CM | POA: Diagnosis not present

## 2019-12-30 DIAGNOSIS — M86171 Other acute osteomyelitis, right ankle and foot: Secondary | ICD-10-CM | POA: Diagnosis not present

## 2019-12-30 NOTE — Telephone Encounter (Signed)
Patient's states he has a few questions didn't feel comfortable discussing with me about his foot infection. Patient's number is   [phone] .

## 2019-12-30 NOTE — Telephone Encounter (Signed)
Called pt this afternoon. Pt did see podiatrist. He wants second opinion on his foot. He has appointment with Dr. Sharol Given tomorrow. I asked pt to point out that foot xray and wound culture done. Pt will give me update as to what Dr. Sharol Given says.

## 2019-12-31 ENCOUNTER — Encounter: Payer: Self-pay | Admitting: Orthopedic Surgery

## 2019-12-31 ENCOUNTER — Ambulatory Visit (INDEPENDENT_AMBULATORY_CARE_PROVIDER_SITE_OTHER): Payer: 59 | Admitting: Orthopedic Surgery

## 2019-12-31 VITALS — Ht 75.0 in | Wt 239.8 lb

## 2019-12-31 DIAGNOSIS — L03115 Cellulitis of right lower limb: Secondary | ICD-10-CM | POA: Diagnosis not present

## 2019-12-31 DIAGNOSIS — M86271 Subacute osteomyelitis, right ankle and foot: Secondary | ICD-10-CM | POA: Diagnosis not present

## 2019-12-31 MED ORDER — SULFAMETHOXAZOLE-TRIMETHOPRIM 800-160 MG PO TABS
1.0000 | ORAL_TABLET | Freq: Two times a day (BID) | ORAL | 0 refills | Status: DC
Start: 2019-12-31 — End: 2021-01-09

## 2019-12-31 NOTE — Progress Notes (Signed)
Office Visit Note   [name]           Date of Birth: [birth year]           MRN: [id number] Visit Date: 12/31/2019              Requested by: Mackie Pai, PA-C [address],  [place] [address] PCP: Mackie Pai, PA-C  Chief Complaint  Patient presents with  . Right Foot - Pain, New Patient (Initial Visit)      HPI: Patient is a 59 year old gentleman who is seen for initial evaluation for ulcer beneath the second metatarsal head.  Patient states that he started having swelling and infection symptoms about 10 days ago he has started on Bactrim DS.  Patient states he is also on a blood thinner.  Assessment & Plan: Visit Diagnoses:  1. Cellulitis of foot, right   2. Subacute osteomyelitis, right ankle and foot ([place])     Plan: With the osteomyelitis involving the second toe I have recommended proceeding with a second ray amputation right foot.  Recommended patient get a kneeling scooter at this time and practice on using it prior to surgery.  Discussed that we will need to keep his weight off the foot for 3 weeks and then he can advance into regular shoes.  We will plan for a right foot second ray amputation at home and on Wednesday refill prescription provided for Bactrim DS.  Risks and benefits were discussed including risk of the wound not healing.  Patient states he understands wished to proceed at this time.  Plan for outpatient surgery.  Follow-Up Instructions: Return in about 1 week (around 01/07/2020).   Ortho Exam  Patient is alert, oriented, no adenopathy, well-dressed, normal affect, normal respiratory effort. Examination patient has cellulitis to the midfoot on the right he has a strong dorsalis pedis pulse there is a 1 cm ulcer beneath the second metatarsal head that probes 4 cm deep extends to the dorsum of his foot.  He has good hair growth on the foot.  Review of the radiographs shows periosteal elevation along the proximal  phalanx consistent with chronic osteomyelitis.  Patient has sausage digit swelling of the second toe.  Imaging: No results found. No images are attached to the encounter.  Labs: Lab Results  Component Value Date   HGBA1C 7.8 (H) 05/08/2019   HGBA1C 8.2 (H) 06/23/2018   HGBA1C 11.7 (H) 01/17/2018     Lab Results  Component Value Date   ALBUMIN 3.9 05/08/2019   ALBUMIN 2.7 (L) 06/23/2018   ALBUMIN 4.4 01/17/2018    Lab Results  Component Value Date   MG 2.1 05/08/2019   MG 2.2 06/26/2018   MG 3.0 (H) 06/26/2018   No results found for: VD25OH  No results found for: PREALBUMIN CBC EXTENDED Latest Ref Rng & Units 12/24/2019 06/28/2018 06/27/2018  WBC 3.8 - 10.8 Thousand/uL 9.3 12.9(H) 12.5(H)  RBC 4.20 - 5.80 Million/uL 4.53 3.85(L) 3.81(L)  HGB 13.2 - 17.1 g/dL 13.8 11.4(L) 11.3(L)  HCT 38.5 - 50.0 % 40.0 34.4(L) 34.9(L)  PLT 140 - 400 Thousand/uL 313 279 242  NEUTROABS 1,500 - 7,800 cells/uL 6,901 - -  LYMPHSABS 850 - 3,900 cells/uL 1,125 - -     Body mass index is 29.97 kg/m.  Orders:  No orders of the defined types were placed in this encounter.  Meds ordered this encounter  Medications  . sulfamethoxazole-trimethoprim (BACTRIM DS) 800-160 MG tablet  Sig: Take 1 tablet by mouth 2 (two) times daily.    Dispense:  40 tablet    Refill:  0     Procedures: No procedures performed  Clinical Data: No additional findings.  ROS:  All other systems negative, except as noted in the HPI. Review of Systems  Objective: Vital Signs: Ht 6\' 3"  (1.905 m)   Wt 239 lb 12.8 oz (108.8 kg)   BMI 29.97 kg/m   Specialty Comments:  No specialty comments available.  PMFS History: Patient Active Problem List   Diagnosis Date Noted  . Poorly controlled type 2 diabetes mellitus with circulatory disorder ([place]) 07/15/2018  . Numbness in both hands 07/15/2018  . 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 Acute coronary syndrome ([place]) 06/22/2018  . Nonhealing skin ulcer ([place])  06/22/2018  . Coronary artery disease involving native coronary artery with unstable angina pectoris ([place]) 06/22/2018  . Acute myocardial infarction ([place]) 06/22/2018  . Hyperlipidemia   . 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 Vertigo 08/20/2016  . Myringotomy tube status 08/20/2016  . Ear fullness, left 08/20/2016  . Asymmetrical left sensorineural hearing loss 02/06/2016  . Neuropathy of left foot 02/03/2015  . Charcot's joint of left foot, non-diabetic 02/03/2015  . 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 Cramping of hands 06/02/2014  . Eustachian tube dysfunction 06/02/2014  . HTN (hypertension) 05/07/2014  . Obstructive sleep apnea 05/07/2014   Past Medical History:  Diagnosis Date  . Acute myocardial infarction ([place]) 06/22/2018  . Allergy   . Charcot's joint of left foot, non-diabetic   . Coronary artery disease involving native coronary artery with unstable angina pectoris ([place]) 06/22/2018  . Diabetes ([place])    type 2  . Hyperlipidemia   . Hypertension   . Neuropathy of left foot   . Obstructive sleep apnea   . Right foot ulcer ([place])   . 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 Type II diabetes mellitus with complication, uncontrolled ([place])   . Uncontrolled type 2 diabetes mellitus ([name]     Family History  Problem Relation Age of Onset  . Diabetes Mother   . Stroke Father   . Hypertension Father   . Hearing loss Father   . Colon polyps Father   . Colon cancer Neg Hx   . Esophageal cancer Neg Hx   . Prostate cancer Neg Hx     Past Surgical History:  Procedure Laterality Date  . CORONARY ARTERY BYPASS GRAFT N/A 06/25/2018   Procedure: CORONARY ARTERY BYPASS GRAFTING (CABG) TIMES FOUR USING LEFT INTERNAL MAMMARY ARTERY AND RIGHT ENDOSCOPICALLY HARVESTED SAPHENOUS VEIN;  Surgeon: Rexene Alberts, MD;  Location: [facility];  Service: Open Heart Surgery;  Laterality: N/A;  . ENDOVEIN  HARVEST OF GREATER SAPHENOUS VEIN Right 06/25/2018   Procedure: ENDOVEIN HARVEST OF GREATER SAPHENOUS VEIN;  Surgeon: Rexene Alberts, MD;  Location: [facility];  Service: Open Heart Surgery;  Laterality: Right;  . LEFT HEART CATH AND CORONARY ANGIOGRAPHY N/A 06/22/2018   Procedure: LEFT HEART CATH AND CORONARY ANGIOGRAPHY;  Surgeon: Nigel Mormon, MD;  Location: [facility];  Service: Cardiovascular;  Laterality: N/A;  . surgical debridement  Right    Right foot ulcer  . TEE WITHOUT CARDIOVERSION N/A 06/25/2018   Procedure: TRANSESOPHAGEAL ECHOCARDIOGRAM (TEE);  Surgeon: Rexene Alberts, MD;  Location: [facility];  Service: Open  Heart Surgery;  Laterality: N/A;   Social History   Occupational History  . Occupation: attorney  Tobacco Use  . Smoking status: Never Smoker  . Smokeless tobacco: Never Used  Substance and Sexual Activity  . Alcohol use: Yes    Alcohol/week: 2.0 standard drinks    Types: 2 Cans of beer per week    Comment: 2 cans of beer a month  . Drug use: Never  . Sexual activity: Not on file

## 2020-01-01 MED FILL — SULFAMETHOXAZOLE-TMP DS TAB: 800-160 | 20 days supply | Qty: 40 | Fill #0

## 2020-01-04 ENCOUNTER — Ambulatory Visit: Payer: 59 | Admitting: Orthopedic Surgery

## 2020-01-04 ENCOUNTER — Other Ambulatory Visit (HOSPITAL_COMMUNITY)
Admission: RE | Admit: 2020-01-04 | Discharge: 2020-01-04 | Disposition: A | Discharge status: Home / Self Care | Payer: 59 | Source: Ambulatory Visit | Attending: Orthopedic Surgery | Admitting: Orthopedic Surgery

## 2020-01-04 ENCOUNTER — Encounter: Payer: Self-pay | Admitting: Orthopedic Surgery

## 2020-01-04 ENCOUNTER — Telehealth: Payer: Self-pay | Admitting: Orthopedic Surgery

## 2020-01-04 ENCOUNTER — Encounter: Payer: Self-pay | Admitting: Medical

## 2020-01-04 ENCOUNTER — Ambulatory Visit (INDEPENDENT_AMBULATORY_CARE_PROVIDER_SITE_OTHER): Payer: 59 | Admitting: Orthopedic Surgery

## 2020-01-04 ENCOUNTER — Other Ambulatory Visit: Payer: Self-pay | Admitting: Physician Assistant

## 2020-01-04 ENCOUNTER — Other Ambulatory Visit: Payer: Self-pay

## 2020-01-04 VITALS — Ht 75.0 in | Wt 239.8 lb

## 2020-01-04 DIAGNOSIS — Z20822 Contact with and (suspected) exposure to covid-19: Secondary | ICD-10-CM | POA: Diagnosis not present

## 2020-01-04 DIAGNOSIS — L03115 Cellulitis of right lower limb: Secondary | ICD-10-CM

## 2020-01-04 DIAGNOSIS — Z01812 Encounter for preprocedural laboratory examination: Secondary | ICD-10-CM | POA: Diagnosis not present

## 2020-01-04 LAB — SARS CORONAVIRUS 2 (TAT 6-24 HRS): SARS Coronavirus 2: NEGATIVE

## 2020-01-04 NOTE — Progress Notes (Signed)
Office Visit Note   [name]           Date of Birth: [birth year]           MRN: [id number] Visit Date: 01/04/2020              Requested by: Mackie Pai, PA-C [address],  [place] [address] PCP: Mackie Pai, PA-C  Chief Complaint  Patient presents with  . Right Foot - Follow-up, Pain      HPI: Patient is a 59 year old gentleman who is scheduled for surgery on Wednesday.  Patient had acute symptoms this morning with sweating dizziness and vertigo and nausea.  Patient states he is feeling much better at this time.  Patient states he has had a history of Mnire's disease and has not been symptomatic for a while.  Assessment & Plan: Visit Diagnoses: No diagnosis found.  Plan: Patient will plan for surgery on Wednesday if this appears to be acute Mnire's flareup.  Discussed that he could use Antivert if authorized by his primary care doctor he is on a lot of medicines and I do not know the interactions.  Follow-Up Instructions: Return in about 1 week (around 01/11/2020).   Ortho Exam  Patient is alert, oriented, no adenopathy, well-dressed, normal affect, normal respiratory effort. Examination patient has good pulses the toe and ulcer are unchanged there is no ascending cellulitis no purulent drainage no signs of any acute sepsis or infection.  Imaging: No results found. No images are attached to the encounter.  Labs: Lab Results  Component Value Date   HGBA1C 7.8 (H) 05/08/2019   HGBA1C 8.2 (H) 06/23/2018   HGBA1C 11.7 (H) 01/17/2018     Lab Results  Component Value Date   ALBUMIN 3.9 05/08/2019   ALBUMIN 2.7 (L) 06/23/2018   ALBUMIN 4.4 01/17/2018    Lab Results  Component Value Date   MG 2.1 05/08/2019   MG 2.2 06/26/2018   MG 3.0 (H) 06/26/2018   No results found for: VD25OH  No results found for: PREALBUMIN CBC EXTENDED Latest Ref Rng & Units 12/24/2019 06/28/2018 06/27/2018  WBC 3.8 - 10.8 Thousand/uL  9.3 12.9(H) 12.5(H)  RBC 4.20 - 5.80 Million/uL 4.53 3.85(L) 3.81(L)  HGB 13.2 - 17.1 g/dL 13.8 11.4(L) 11.3(L)  HCT 38.5 - 50.0 % 40.0 34.4(L) 34.9(L)  PLT 140 - 400 Thousand/uL 313 279 242  NEUTROABS 1,500 - 7,800 cells/uL 6,901 - -  LYMPHSABS 850 - 3,900 cells/uL 1,125 - -     Body mass index is 29.97 kg/m.  Orders:  No orders of the defined types were placed in this encounter.  No orders of the defined types were placed in this encounter.    Procedures: No procedures performed  Clinical Data: No additional findings.  ROS:  All other systems negative, except as noted in the HPI. Review of Systems  Objective: Vital Signs: Ht 6\' 3"  (1.905 m)   Wt 239 lb 12.8 oz (108.8 kg)   BMI 29.97 kg/m   Specialty Comments:  No specialty comments available.  PMFS History: Patient Active Problem List   Diagnosis Date Noted  . Poorly controlled type 2 diabetes mellitus with circulatory disorder ([place]) 07/15/2018  . Numbness in both hands 07/15/2018  . 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 Acute coronary syndrome ([place]) 06/22/2018  . Nonhealing skin ulcer ([place]) 06/22/2018  . Coronary artery disease involving native coronary artery with unstable angina pectoris ([place]) 06/22/2018  . Acute  myocardial infarction ([place]) 06/22/2018  . Hyperlipidemia   . 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 Vertigo 08/20/2016  . Myringotomy tube status 08/20/2016  . Ear fullness, left 08/20/2016  . Asymmetrical left sensorineural hearing loss 02/06/2016  . Neuropathy of left foot 02/03/2015  . Charcot's joint of left foot, non-diabetic 02/03/2015  . 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 Cramping of hands 06/02/2014  . Eustachian tube dysfunction 06/02/2014  . HTN (hypertension) 05/07/2014  . Obstructive sleep apnea 05/07/2014   Past Medical History:  Diagnosis Date  . Acute myocardial infarction ([place]) 06/22/2018  . Allergy   . Charcot's joint of left foot, non-diabetic   . Coronary  artery disease involving native coronary artery with unstable angina pectoris ([place]) 06/22/2018  . Diabetes ([place])    type 2  . Hyperlipidemia   . Hypertension   . Neuropathy of left foot   . Obstructive sleep apnea   . Right foot ulcer ([place])   . 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 Type II diabetes mellitus with complication, uncontrolled ([place])   . Uncontrolled type 2 diabetes mellitus ([name]     Family History  Problem Relation Age of Onset  . Diabetes Mother   . Stroke Father   . Hypertension Father   . Hearing loss Father   . Colon polyps Father   . Colon cancer Neg Hx   . Esophageal cancer Neg Hx   . Prostate cancer Neg Hx     Past Surgical History:  Procedure Laterality Date  . CORONARY ARTERY BYPASS GRAFT N/A 06/25/2018   Procedure: CORONARY ARTERY BYPASS GRAFTING (CABG) TIMES FOUR USING LEFT INTERNAL MAMMARY ARTERY AND RIGHT ENDOSCOPICALLY HARVESTED SAPHENOUS VEIN;  Surgeon: Rexene Alberts, MD;  Location: [facility];  Service: Open Heart Surgery;  Laterality: N/A;  . ENDOVEIN HARVEST OF GREATER SAPHENOUS VEIN Right 06/25/2018   Procedure: ENDOVEIN HARVEST OF GREATER SAPHENOUS VEIN;  Surgeon: Rexene Alberts, MD;  Location: [facility];  Service: Open Heart Surgery;  Laterality: Right;  . LEFT HEART CATH AND CORONARY ANGIOGRAPHY N/A 06/22/2018   Procedure: LEFT HEART CATH AND CORONARY ANGIOGRAPHY;  Surgeon: Nigel Mormon, MD;  Location: [facility];  Service: Cardiovascular;  Laterality: N/A;  . surgical debridement  Right    Right foot ulcer  . TEE WITHOUT CARDIOVERSION N/A 06/25/2018   Procedure: TRANSESOPHAGEAL ECHOCARDIOGRAM (TEE);  Surgeon: Rexene Alberts, MD;  Location: [facility];  Service: Open Heart Surgery;  Laterality: N/A;   Social History   Occupational History  . Occupation: attorney  Tobacco Use  . Smoking status: Never Smoker  . Smokeless tobacco: Never Used  Substance and Sexual  Activity  . Alcohol use: Yes    Alcohol/week: 2.0 standard drinks    Types: 2 Cans of beer per week    Comment: 2 cans of beer a month  . Drug use: Never  . Sexual activity: Not on file

## 2020-01-04 NOTE — Telephone Encounter (Signed)
Patient called.   He did not disclose why but he is requesting a call back from his providers.  Call back: [phone]

## 2020-01-04 NOTE — Telephone Encounter (Signed)
Patient's son called.   Requesting a call back for advice on how to help his father until he can be seen again.   Call back: [phone]

## 2020-01-04 NOTE — Telephone Encounter (Signed)
Appt made for pt today at 2pm

## 2020-01-04 NOTE — Telephone Encounter (Signed)
I called and the pt cancelled the appt that we had just discussed this afternoon and wanted to know if I thought he was having vertigo. I advised that he would need a provider to evaluate and that he is sch for surgery Wednesday and that with the other s/s that he was having make sure that its not something stemming from his foot. Pt was worked back on the schedule for 3 pm today.

## 2020-01-05 ENCOUNTER — Other Ambulatory Visit: Payer: Self-pay

## 2020-01-05 ENCOUNTER — Encounter (HOSPITAL_COMMUNITY): Payer: Self-pay | Admitting: Orthopedic Surgery

## 2020-01-05 MED ORDER — VANCOMYCIN HCL 1500 MG/300ML IV SOLN
1500.0000 mg | Freq: Once | INTRAVENOUS | Status: AC
  Administered 2020-01-06: 1500 mg via INTRAVENOUS
  Filled 2020-01-05 (×2): qty 300

## 2020-01-05 NOTE — Progress Notes (Signed)
Anesthesia Chart Review: Joshua Branch   Case: [id number] Date/Time: 01/06/20 0914   Procedure: RIGHT FOOT 2ND RAY AMPUTATION (Right )   Anesthesia type: Choice   Pre-op diagnosis: Osteomyelitis Right 2nd Metatarsal   Location: [facility] OR ROOM 03 / [facility]   Surgeons: Newt Minion, MD      DISCUSSION: Patient is a 59 year old male scheduled for the above procedure. He is diabetic with history of foot ulcers. Evaluated by PCP on 12/23/13 with recent report of fever with redness and swelling of his right second toe. He was started on Bactrim. Xray concerning for osteomyelitis. He was referred to Dr. Sharol Given and initially seen on 12/31/19 with above surgery recommended.   History includes never smoker, CAD (MI, likely late presentation, s/p CABG 06/25/18: LIMA-LAD, SVG-[name], SVG-OM2, SVG-DIAG), HTN, OSA, DM2, neuropathy, HLD.   Had vertigo symptoms on the morning of 01/04/20, his usual PCP was out of the office, but staff offered another provider if needed.  He instead was seen by Dr. Sharol Given who thought likely acute Mnire's flareup and suggested Antivert if authorized by primary care--patient was feeling "much better" at the time he was seen by Dr. Sharol Given. 01/04/2020 presurgical COVID-19 test negative.  Patient with osteomyelitis of his right 2nd toe. He is a same day work-up. Last seen by cardiology 10/2018 and by CT surgery 05/2019. He denied chest pain and SOB per RN phone interview. His underwent coronary revascularization/CABG < 20 months ago. He is on Plavix and ASA. He reported home fasting CBGs ~ 110-120. Discussed with anesthesiologist Hoy Morn, MD. Anesthesiologist to evaluate on the day of surgery. EKG is > 7 year old, so plan for labs and EKG on arrival.    VS: BP Readings from Last 3 Encounters:  12/24/19 121/79  05/08/19 116/76  11/21/18 116/74   Pulse Readings from Last 3 Encounters:  12/24/19 94  05/08/19 81  11/21/18 88     PROVIDERS: [place], Iris Pert is PCP  Quay Burow,  MD is primary cardiologist. Last evaluation 11/21/18. He was doing well at that time, although unable to participate in cardiac rehab due to foot ulcers. Six month follow-up planned. Darylene Price, MD is CT surgeon. As needed CT surgery follow-up after 06/15/19 visit, with ongoing cardiology follow-up.  Philemon Kingdom, MD is endocrinologist. Last evaluation 11/13/19.    LABS: For updated labs as indicated on the day of surgery. As of 12/24/19 lab results include: Lab Results  Component Value Date   WBC 9.3 12/24/2019   HGB 13.8 12/24/2019   HCT 40.0 12/24/2019   PLT 313 12/24/2019   GLUCOSE 135 (H) 05/08/2019   ALT 12 05/08/2019   AST 11 05/08/2019   NA 141 05/08/2019   K 4.5 05/08/2019   CL 101 05/08/2019   CREATININE 0.88 05/08/2019   BUN 16 05/08/2019   CO2 32 05/08/2019   TSH 2.05 05/08/2019   HGBA1C 7.8 (H) 05/08/2019    Spirometry 06/24/18: Results for Joshua Branch, Joshua Branch (MRN [id number]) as of 01/05/2020 12:45  Ref. Range 06/24/2018 11:27  FVC-Pre Latest Units: L 4.23  FVC-%Pred-Pre Latest Units: % 74  FEV1-Pre Latest Units: L 3.38  FEV1-%Pred-Pre Latest Units: % 77  Pre FEV1/FVC ratio Latest Units: % 80  FEV1FVC-%Pred-Pre Latest Units: % 103  FEF 25-75 Pre Latest Units: L/sec 3.12  FEF2575-%Pred-Pre Latest Units: % 85  FEV6-Pre Latest Units: L 4.18  FEV6-%Pred-Pre Latest Units: % 76  Pre FEV6/FVC Ratio Latest Units: % 100  FEV6FVC-%Pred-Pre Latest Units: % 103  IMAGES: Right foot xray 12/24/19: IMPRESSION: 1. Extensive soft tissue swelling about the second digit with an apparent periosteal reaction involving the proximal phalanx. This raises concern for developing osteomyelitis. 2. Questionable radiopaque foreign body in the plantar soft tissues of the foot. 3. A plantar soft tissue ulcer is noted.   EKG: > 1 year ago.    CV: TEE (inta-op CABG) 06/25/18:  Septum: No Patent Foramen Ovale present.   Left atrium: Patent foramen ovale not present.    Aortic valve: The valve is trileaflet. Mild valve calcification present. No stenosis. Trace regurgitation. No AV vegetation.   Mitral valve: No leaflet thickening and calcification present. Trace regurgitation.   Right ventricle: Normal cavity size, wall thickness and ejection fraction. No thrombus present. No mass present.  Left ventricle: Normal cavity size, wall thickness. LV systolic function is low normal with an EF 50-55%. Wall motion is abnormal. Inferolateral wall motion in hypokinetic. No thrombus present. No mass present.    Echo 06/22/18: Study Conclusions  - Left ventricle: The cavity size was normal. Wall thickness was  normal. Systolic function was normal. The estimated ejection  fraction was in the range of 50% to 55%. Mild hypokinesis of the  apical myocardium. No thrombus seen on contrast study. Left  ventricular diastolic function parameters were normal.  - Aortic valve: Mildly calcified annulus. Mildly thickened  leaflets. No significant stenosis or regurgitation.  - Mitral valve: Mildly calcified annulus. No significant stenosis  or regurgitation.    Cardiac cath 06/22/18 (PRE-CABG:  Ost 1st Diag to 1st Diag lesion is 70% stenosed.  1st Diag lesion is 80% stenosed. LM: Normal LAD: Prox ectatic areas with 40% stenoses          Mid to distal LAD is likely the culprit vessel. Mid 100% occlusion, with          recanalization.Distal apical LAD is completely occluded and receives faint collaterals from septal perforators, and distal RPDA. TIMI 1 flow in distal LAD, TIMI 0 flow in distal apical LAD          Medium sized Diag branch with mid 70% and distal 80% stenoses.  LCx: Large OM 1 with proxiaml 50% and mid to distal 80% stenosis         Medium sized OM2 completely occluded with collaterals from OM1.  RCA: Dominant. Prox 40%, mid 80%, and ostial RPDA 95% stenoses. RPDA gives faint collateral to apical distal  LVEF 50-55% with apical hypokinesis.   Recommendation: CT surgery consult (S/p CABG 06/25/18)   Carotid US 06/23/18: Summary:  - Right Carotid: Velocities in the right ICA are consistent with a 1-39%  stenosis.  - Left Carotid: Velocities in the left ICA are consistent with a 1-39%  stenosis.  - Vertebrals: Bilateral vertebral arteries demonstrate antegrade flow.    Past Medical History:  Diagnosis Date  . Acute myocardial infarction ([place]) 06/22/2018  . Allergy   . Charcot's joint of left foot, non-diabetic   . Coronary artery disease involving native coronary artery with unstable angina pectoris ([place]) 06/22/2018  . Diabetes ([place])    type 2  . History of kidney stones    passed  . Hyperlipidemia   . Hypertension   . Neuropathy of left foot   . Obstructive sleep apnea   . Pneumonia    As a child  . Right foot ulcer ([place])   . 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 Type II diabetes mellitus with complication, uncontrolled ([place])   . Uncontrolled type 2 diabetes mellitus ([place])     Past Surgical History:  Procedure Laterality Date  . COLONOSCOPY W/ POLYPECTOMY    . CORONARY ARTERY BYPASS GRAFT N/A 06/25/2018   Procedure: CORONARY ARTERY BYPASS GRAFTING (CABG) TIMES FOUR USING LEFT INTERNAL MAMMARY ARTERY AND RIGHT ENDOSCOPICALLY HARVESTED SAPHENOUS VEIN;  Surgeon: Rexene Alberts, MD;  Location: [facility];  Service: Open Heart Surgery;  Laterality: N/A;  . ENDOVEIN HARVEST OF GREATER SAPHENOUS VEIN Right 06/25/2018   Procedure: ENDOVEIN HARVEST OF GREATER SAPHENOUS VEIN;  Surgeon: Rexene Alberts, MD;  Location: [facility];  Service: Open Heart Surgery;  Laterality: Right;  . LEFT HEART CATH AND CORONARY ANGIOGRAPHY N/A 06/22/2018   Procedure: LEFT HEART CATH AND CORONARY ANGIOGRAPHY;  Surgeon: Nigel Mormon, MD;  Location: [facility];  Service: Cardiovascular;  Laterality: N/A;  . surgical debridement  Right    Right foot ulcer  . TEE  WITHOUT CARDIOVERSION N/A 06/25/2018   Procedure: TRANSESOPHAGEAL ECHOCARDIOGRAM (TEE);  Surgeon: Rexene Alberts, MD;  Location: [facility];  Service: Open Heart Surgery;  Laterality: N/A;    MEDICATIONS: . [START ON 01/06/2020] vancomycin (VANCOREADY) IVPB 1500 mg/300 mL   . aspirin EC 81 MG tablet  . atorvastatin (LIPITOR) 80 MG tablet  . canagliflozin (INVOKANA) 100 MG TABS tablet  . clopidogrel (PLAVIX) 75 MG tablet  . Coenzyme Q10 (CO Q-10) 300 MG CAPS  . lisinopril (ZESTRIL) 10 MG tablet  . metFORMIN (GLUCOPHAGE) 500 MG tablet  . metoprolol tartrate (LOPRESSOR) 25 MG tablet  . Multiple Vitamin (MULTIVITAMIN WITH MINERALS) TABS tablet  . sulfamethoxazole-trimethoprim (BACTRIM DS) 800-160 MG tablet  . vitamin B-12 (CYANOCOBALAMIN) 1000 MCG tablet  . azelastine (ASTELIN) 0.1 % nasal spray  . Blood Glucose Monitoring Suppl (FREESTYLE LITE) DEVI  . cetirizine (ZYRTEC) 10 MG tablet  . glucose blood test strip  . Lancets MISC  . sildenafil (REVATIO) 20 MG tablet  . sildenafil (VIAGRA) 50 MG tablet  . sulfamethoxazole-trimethoprim (BACTRIM DS) 800-160 MG tablet     Myra Gianotti, PA-C Surgical Short Stay/Anesthesiology [facility] Phone [phone] [facility] Phone [phone] 01/05/2020 3:40 PM

## 2020-01-05 NOTE — Progress Notes (Signed)
Joshua Branch denies cheat pain or shortness of breath. Patient tested negative for Covid_6/7 and has been in quarantine since that time.  I instructed patient to not take any medications for DM in am and to not take Inkokanady the day of surgery. Joshua Branch reports that fasting CBG's run 110 - 120. I instructed patient to check CBG after awaking and every 2 hours until arrival  to the hospital.  I Instructed patient if CBG is less than 70 to drink 4 Glucose   1/2 cup of a clear juice. Recheck CBG in 15 minutes then call pre- op desk at [phone] for further instructions.

## 2020-01-05 NOTE — Anesthesia Preprocedure Evaluation (Addendum)
Anesthesia Evaluation  Patient identified by MRN, date of birth, ID band Patient awake    Reviewed: Allergy & Precautions, NPO status , Patient's Chart, lab work & pertinent test results, reviewed documented beta blocker date and time   History of Anesthesia Complications Negative for: history of anesthetic complications  Airway Mallampati: II  TM Distance: >3 FB Neck ROM: Full    Dental  (+) Missing,    Pulmonary sleep apnea ,    Pulmonary exam normal        Cardiovascular hypertension, Pt. on medications and Pt. on home beta blockers + CAD, + Past MI and + CABG (05/2018, on Plavix)  Normal cardiovascular exam     Neuro/Psych negative neurological ROS  negative psych ROS   GI/Hepatic negative GI ROS, Neg liver ROS,   Endo/Other  diabetes, Type 2, Oral Hypoglycemic Agents  Renal/GU negative Renal ROS  negative genitourinary   Musculoskeletal  (+) Arthritis ,   Abdominal   Peds  Hematology negative hematology ROS (+)   Anesthesia Other Findings Day of surgery medications reviewed with patient.  Reproductive/Obstetrics negative OB ROS                            Anesthesia Physical Anesthesia Plan  ASA: III  Anesthesia Plan: MAC and Regional   Post-op Pain Management:    Induction:   PONV Risk Score and Plan: 1 and Treatment may vary due to age or medical condition, Propofol infusion, Ondansetron and Midazolam  Airway Management Planned: Natural Airway and Simple Face Mask  Additional Equipment: None  Intra-op Plan:   Post-operative Plan:   Informed Consent: I have reviewed the patients History and Physical, chart, labs and discussed the procedure including the risks, benefits and alternatives for the proposed anesthesia with the patient or authorized representative who has indicated his/her understanding and acceptance.       Plan Discussed with: CRNA  Anesthesia Plan  Comments: (PAT note written 01/05/2020 by Myra Gianotti, PA-C. )      Anesthesia Quick Evaluation

## 2020-01-06 ENCOUNTER — Encounter (HOSPITAL_COMMUNITY): Payer: Self-pay | Admitting: Orthopedic Surgery

## 2020-01-06 ENCOUNTER — Ambulatory Visit (HOSPITAL_COMMUNITY): Payer: 59 | Admitting: Vascular Surgery

## 2020-01-06 ENCOUNTER — Encounter (HOSPITAL_COMMUNITY)
Admission: RE | Disposition: A | Discharge status: Home / Self Care | Payer: Self-pay | Source: Home / Self Care | Attending: Orthopedic Surgery

## 2020-01-06 ENCOUNTER — Ambulatory Visit (HOSPITAL_COMMUNITY)
Admission: RE | Admit: 2020-01-06 | Discharge: 2020-01-06 | Disposition: A | Discharge status: Home / Self Care | Payer: 59 | Attending: Orthopedic Surgery | Admitting: Orthopedic Surgery

## 2020-01-06 DIAGNOSIS — I25110 Atherosclerotic heart disease of native coronary artery with unstable angina pectoris: Secondary | ICD-10-CM | POA: Diagnosis not present

## 2020-01-06 DIAGNOSIS — Z8249 Family history of ischemic heart disease and other diseases of the circulatory system: Secondary | ICD-10-CM | POA: Insufficient documentation

## 2020-01-06 DIAGNOSIS — M14672 Charcot's joint, left ankle and foot: Secondary | ICD-10-CM | POA: Insufficient documentation

## 2020-01-06 DIAGNOSIS — I10 Essential (primary) hypertension: Secondary | ICD-10-CM | POA: Diagnosis not present

## 2020-01-06 DIAGNOSIS — Z7984 Long term (current) use of oral hypoglycemic drugs: Secondary | ICD-10-CM | POA: Insufficient documentation

## 2020-01-06 DIAGNOSIS — I2510 Atherosclerotic heart disease of native coronary artery without angina pectoris: Secondary | ICD-10-CM | POA: Insufficient documentation

## 2020-01-06 DIAGNOSIS — L03115 Cellulitis of right lower limb: Secondary | ICD-10-CM | POA: Diagnosis not present

## 2020-01-06 DIAGNOSIS — Z79899 Other long term (current) drug therapy: Secondary | ICD-10-CM | POA: Diagnosis not present

## 2020-01-06 DIAGNOSIS — Z951 Presence of aortocoronary bypass graft: Secondary | ICD-10-CM | POA: Insufficient documentation

## 2020-01-06 DIAGNOSIS — E1169 Type 2 diabetes mellitus with other specified complication: Secondary | ICD-10-CM | POA: Insufficient documentation

## 2020-01-06 DIAGNOSIS — Z833 Family history of diabetes mellitus: Secondary | ICD-10-CM | POA: Diagnosis not present

## 2020-01-06 DIAGNOSIS — Z7982 Long term (current) use of aspirin: Secondary | ICD-10-CM | POA: Diagnosis not present

## 2020-01-06 DIAGNOSIS — G4733 Obstructive sleep apnea (adult) (pediatric): Secondary | ICD-10-CM | POA: Insufficient documentation

## 2020-01-06 DIAGNOSIS — M869 Osteomyelitis, unspecified: Secondary | ICD-10-CM | POA: Diagnosis not present

## 2020-01-06 DIAGNOSIS — I252 Old myocardial infarction: Secondary | ICD-10-CM | POA: Diagnosis not present

## 2020-01-06 DIAGNOSIS — E785 Hyperlipidemia, unspecified: Secondary | ICD-10-CM | POA: Diagnosis not present

## 2020-01-06 HISTORY — DX: Gastro-esophageal reflux disease without esophagitis: K21.9

## 2020-01-06 HISTORY — DX: Personal history of urinary calculi: Z87.442

## 2020-01-06 HISTORY — DX: Pneumonia, unspecified organism: J18.9

## 2020-01-06 HISTORY — PX: AMPUTATION: SHX166

## 2020-01-06 LAB — BASIC METABOLIC PANEL
Anion gap: 12 (ref 5–15)
BUN: 25 mg/dL — ABNORMAL HIGH (ref 6–20)
CO2: 24 mmol/L (ref 22–32)
Calcium: 8.9 mg/dL (ref 8.9–10.3)
Chloride: 100 mmol/L (ref 98–111)
Creatinine, Ser: 1.34 mg/dL — ABNORMAL HIGH (ref 0.61–1.24)
GFR calc Af Amer: >60 mL/min (ref >60)
GFR calc non Af Amer: 58 mL/min — ABNORMAL LOW (ref >60)
Glucose, Bld: 145 mg/dL — ABNORMAL HIGH (ref 70–99)
Potassium: 4.4 mmol/L (ref 3.5–5.1)
Sodium: 136 mmol/L (ref 135–145)

## 2020-01-06 LAB — CBC
HCT: 39 % (ref 39.0–52.0)
Hemoglobin: 12.6 g/dL — ABNORMAL LOW (ref 13.0–17.0)
MCH: 29.9 pg (ref 26.0–34.0)
MCHC: 32.3 g/dL (ref 30.0–36.0)
MCV: 92.6 fL (ref 80.0–100.0)
Platelets: 411 10*3/uL — ABNORMAL HIGH (ref 150–400)
RBC: 4.21 MIL/uL — ABNORMAL LOW (ref 4.22–5.81)
RDW: 12.4 % (ref 11.5–15.5)
WBC: 8.9 10*3/uL (ref 4.0–10.5)
nRBC: 0 % (ref 0.0–0.2)

## 2020-01-06 LAB — GLUCOSE, CAPILLARY
Glucose-Capillary: 122 mg/dL — ABNORMAL HIGH (ref 70–99)
Glucose-Capillary: 159 mg/dL — ABNORMAL HIGH (ref 70–99)

## 2020-01-06 SURGERY — AMPUTATION, FOOT, RAY
Anesthesia: Monitor Anesthesia Care | Laterality: Right

## 2020-01-06 MED ORDER — LIDOCAINE HCL (CARDIAC) PF 100 MG/5ML IV SOSY
PREFILLED_SYRINGE | INTRAVENOUS | Status: DC | PRN
Start: 2020-01-06 — End: 2020-01-06
  Administered 2020-01-06: 20 mg via INTRATRACHEAL

## 2020-01-06 MED ORDER — PROPOFOL 10 MG/ML IV BOLUS
INTRAVENOUS | Status: AC
  Filled 2020-01-06: qty 20

## 2020-01-06 MED ORDER — METOPROLOL SUCCINATE ER 25 MG PO TB24
ORAL_TABLET | ORAL | Status: AC
  Administered 2020-01-06: 25 mg via ORAL
  Filled 2020-01-06: qty 1

## 2020-01-06 MED ORDER — PROPOFOL 10 MG/ML IV BOLUS
INTRAVENOUS | Status: DC | PRN
  Administered 2020-01-06: 20 mg via INTRAVENOUS

## 2020-01-06 MED ORDER — FENTANYL CITRATE (PF) 250 MCG/5ML IJ SOLN
INTRAMUSCULAR | Status: AC
  Filled 2020-01-06: qty 5

## 2020-01-06 MED ORDER — PHENYLEPHRINE HCL (PRESSORS) 10 MG/ML IV SOLN
INTRAVENOUS | Status: DC | PRN
Start: 2020-01-06 — End: 2020-01-06
  Administered 2020-01-06: 80 ug via INTRAVENOUS

## 2020-01-06 MED ORDER — CEFAZOLIN SODIUM-DEXTROSE 2-4 GM/100ML-% IV SOLN
2.0000 g | INTRAVENOUS | Status: AC
  Administered 2020-01-06: 2 g via INTRAVENOUS

## 2020-01-06 MED ORDER — LACTATED RINGERS IV SOLN: INTRAVENOUS | Status: DC | PRN

## 2020-01-06 MED ORDER — ONDANSETRON HCL 4 MG/2ML IJ SOLN
INTRAMUSCULAR | Status: DC | PRN
  Administered 2020-01-06: 4 mg via INTRAVENOUS

## 2020-01-06 MED ORDER — ORAL CARE MOUTH RINSE: 15.0000 mL | Freq: Once | OROMUCOSAL | Status: AC

## 2020-01-06 MED ORDER — LACTATED RINGERS IV SOLN: INTRAVENOUS | Status: DC

## 2020-01-06 MED ORDER — METOPROLOL SUCCINATE ER 25 MG PO TB24: 25.0000 mg | ORAL_TABLET | Freq: Once | ORAL | Status: AC

## 2020-01-06 MED ORDER — FENTANYL CITRATE (PF) 100 MCG/2ML IJ SOLN
INTRAMUSCULAR | Status: DC | PRN
  Administered 2020-01-06: 50 ug via INTRAVENOUS

## 2020-01-06 MED ORDER — CHLORHEXIDINE GLUCONATE 0.12 % MT SOLN: 15.0000 mL | Freq: Once | OROMUCOSAL | Status: AC

## 2020-01-06 MED ORDER — MIDAZOLAM HCL 2 MG/2ML IJ SOLN
INTRAMUSCULAR | Status: AC
  Filled 2020-01-06: qty 2

## 2020-01-06 MED ORDER — BUPIVACAINE HCL 0.5 % IJ SOLN
INTRAMUSCULAR | Status: DC | PRN
Start: 2020-01-06 — End: 2020-01-06
  Administered 2020-01-06: 30 mL

## 2020-01-06 MED ORDER — 0.9 % SODIUM CHLORIDE (POUR BTL) OPTIME
TOPICAL | Status: DC | PRN
  Administered 2020-01-06: 1000 mL

## 2020-01-06 MED ORDER — ACETAMINOPHEN 500 MG PO TABS
1000.0000 mg | ORAL_TABLET | Freq: Once | ORAL | Status: AC
  Administered 2020-01-06: 1000 mg via ORAL
  Filled 2020-01-06: qty 2

## 2020-01-06 MED ORDER — CHLORHEXIDINE GLUCONATE 0.12 % MT SOLN
OROMUCOSAL | Status: AC
  Administered 2020-01-06: 15 mL via OROMUCOSAL
  Filled 2020-01-06: qty 15

## 2020-01-06 MED ORDER — MIDAZOLAM HCL 5 MG/5ML IJ SOLN
INTRAMUSCULAR | Status: DC | PRN
  Administered 2020-01-06 (×2): 1 mg via INTRAVENOUS

## 2020-01-06 MED ORDER — TRAMADOL HCL 50 MG PO TABS
50.0000 mg | ORAL_TABLET | Freq: Four times a day (QID) | ORAL | 0 refills | Status: AC | PRN
Start: 2020-01-06 — End: 2021-01-05

## 2020-01-06 MED ORDER — PROPOFOL 500 MG/50ML IV EMUL
INTRAVENOUS | Status: DC | PRN
  Administered 2020-01-06: 50 ug/kg/min via INTRAVENOUS

## 2020-01-06 SURGICAL SUPPLY — 30 items
BLADE SAW SGTL MED 73X18.5 STR (BLADE) IMPLANT
BLADE SURG 21 STRL SS (BLADE) ×2 IMPLANT
BNDG COHESIVE 4X5 TAN STRL (GAUZE/BANDAGES/DRESSINGS) ×2 IMPLANT
BNDG GAUZE ELAST 4 BULKY (GAUZE/BANDAGES/DRESSINGS) ×2 IMPLANT
COVER SURGICAL LIGHT HANDLE (MISCELLANEOUS) ×4 IMPLANT
COVER WAND RF STERILE (DRAPES) ×2 IMPLANT
DRAPE U-SHAPE 47X51 STRL (DRAPES) ×4 IMPLANT
DRSG ADAPTIC 3X8 NADH LF (GAUZE/BANDAGES/DRESSINGS) ×2 IMPLANT
DRSG EMULSION OIL 3X3 NADH (GAUZE/BANDAGES/DRESSINGS) ×2 IMPLANT
DRSG PAD ABDOMINAL 8X10 ST (GAUZE/BANDAGES/DRESSINGS) ×4 IMPLANT
DURAPREP 26ML APPLICATOR (WOUND CARE) ×2 IMPLANT
ELECT REM PT RETURN 9FT ADLT (ELECTROSURGICAL) ×2
ELECTRODE REM PT RTRN 9FT ADLT (ELECTROSURGICAL) ×1 IMPLANT
GAUZE SPONGE 4X4 12PLY STRL (GAUZE/BANDAGES/DRESSINGS) ×2 IMPLANT
GLOVE BIOGEL PI IND STRL 9 (GLOVE) ×1 IMPLANT
GLOVE BIOGEL PI INDICATOR 9 (GLOVE) ×1
GLOVE SURG ORTHO 9.0 STRL STRW (GLOVE) ×2 IMPLANT
GOWN STRL REUS W/ TWL XL LVL3 (GOWN DISPOSABLE) ×2 IMPLANT
GOWN STRL REUS W/TWL XL LVL3 (GOWN DISPOSABLE) ×2
KIT BASIN OR (CUSTOM PROCEDURE TRAY) ×2 IMPLANT
KIT TURNOVER KIT B (KITS) ×2 IMPLANT
NS IRRIG 1000ML POUR BTL (IV SOLUTION) ×2 IMPLANT
PACK ORTHO EXTREMITY (CUSTOM PROCEDURE TRAY) ×2 IMPLANT
PAD ABD 8X10 STRL (GAUZE/BANDAGES/DRESSINGS) ×2 IMPLANT
PAD ARMBOARD 7.5X6 YLW CONV (MISCELLANEOUS) ×4 IMPLANT
STOCKINETTE IMPERVIOUS LG (DRAPES) IMPLANT
SUT ETHILON 2 0 PSLX (SUTURE) ×2 IMPLANT
TOWEL GREEN STERILE (TOWEL DISPOSABLE) ×2 IMPLANT
TUBE CONNECTING 12X1/4 (SUCTIONS) ×2 IMPLANT
YANKAUER SUCT BULB TIP NO VENT (SUCTIONS) ×2 IMPLANT

## 2020-01-06 NOTE — Anesthesia Procedure Notes (Signed)
Procedure Name: MAC Date/Time: 01/06/2020 8:32 AM Performed by: Neldon Newport, CRNA Pre-anesthesia Checklist: Patient identified, Emergency Drugs available, Suction available, Patient being monitored and Timeout performed Oxygen Delivery Method: Nasal cannula Placement Confirmation: positive ETCO2

## 2020-01-06 NOTE — Addendum Note (Signed)
Addendum  created 01/06/20 0951 by Neldon Newport, CRNA   Charge Capture section accepted

## 2020-01-06 NOTE — H&P (Signed)
Joshua Branch is an 59 y.o. male.   Chief Complaint: Right Foot Cellulitis HPI:   Patient is a 59 year old gentleman who is seen for initial evaluation for ulcer beneath the second metatarsal head.  Patient states that he started having swelling and infection symptoms about 10 days ago he has started on Bactrim DS.  Patient states he is also on a blood thinner. Past Medical History:  Diagnosis Date  . Acute myocardial infarction ([place]) 06/22/2018  . Allergy   . Charcot's joint of left foot, non-diabetic   . Coronary artery disease involving native coronary artery with unstable angina pectoris ([place]) 06/22/2018  . Diabetes ([place])    type 2  . History of kidney stones    passed  . Hyperlipidemia   . Hypertension   . Neuropathy of left foot   . Obstructive sleep apnea   . Pneumonia    As a child  . Right foot ulcer ([place])   . 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 Type II diabetes mellitus with complication, uncontrolled ([place])   . Uncontrolled type 2 diabetes mellitus ([place])     Past Surgical History:  Procedure Laterality Date  . COLONOSCOPY W/ POLYPECTOMY    . CORONARY ARTERY BYPASS GRAFT N/A 06/25/2018   Procedure: CORONARY ARTERY BYPASS GRAFTING (CABG) TIMES FOUR USING LEFT INTERNAL MAMMARY ARTERY AND RIGHT ENDOSCOPICALLY HARVESTED SAPHENOUS VEIN;  Surgeon: Rexene Alberts, MD;  Location: [facility];  Service: Open Heart Surgery;  Laterality: N/A;  . ENDOVEIN HARVEST OF GREATER SAPHENOUS VEIN Right 06/25/2018   Procedure: ENDOVEIN HARVEST OF GREATER SAPHENOUS VEIN;  Surgeon: Rexene Alberts, MD;  Location: [place];  Service: Open Heart Surgery;  Laterality: Right;  . LEFT HEART CATH AND CORONARY ANGIOGRAPHY N/A 06/22/2018   Procedure: LEFT HEART CATH AND CORONARY ANGIOGRAPHY;  Surgeon: Nigel Mormon, MD;  Location: [place] [facility];  Service: Cardiovascular;  Laterality: N/A;  . surgical debridement  Right    Right foot ulcer  . TEE WITHOUT CARDIOVERSION N/A 06/25/2018   Procedure: TRANSESOPHAGEAL ECHOCARDIOGRAM (TEE);  Surgeon: Rexene Alberts, MD;  Location: [facility];  Service: Open Heart Surgery;  Laterality: N/A;    Family History  Problem Relation Age of Onset  . Diabetes Mother   . Stroke Father   . Hypertension Father   . Hearing loss Father   . Colon polyps Father   . Colon cancer Neg Hx   . Esophageal cancer Neg Hx   . Prostate cancer Neg Hx    Social History:  reports that he has never smoked. He has never used smokeless tobacco. He reports current alcohol use of about 2.0 standard drinks of alcohol per week. He reports that he does not use drugs.  Allergies:  Allergies  Allergen Reactions  . Apricot Kernel Oil [Prunus] Swelling    THROAT  . Cherry Swelling    THROAT  . Jardiance [Empagliflozin] Diarrhea and Nausea And Vomiting  . Other Other (See Comments)    Fruits with a pit. Throat swells up. Can have them if cooked   . Peach [Prunus Persica] Swelling    THROAT  . Plum Pulp Swelling    THROAT    Medications Prior to Admission  Medication Sig Dispense Refill  . aspirin EC 81 MG tablet Take 81 mg by mouth daily.    Marland Kitchen atorvastatin (LIPITOR) 80 MG tablet  Take 1 tablet (80 mg total) by mouth daily at 6 PM. 90 tablet 3  . canagliflozin (INVOKANA) 100 MG TABS tablet TAKE 1 TABLET (100 MG TOTAL) BY MOUTH DAILY BEFORE BREAKFAST. (Patient taking differently: Take 100 mg by mouth daily before breakfast. ) 90 tablet 3  . clopidogrel (PLAVIX) 75 MG tablet Take 1 tablet (75 mg total) by mouth daily. 90 tablet 3  . Coenzyme Q10 (CO Q-10) 300 MG CAPS Take 300 mg by mouth daily.    Marland Kitchen lisinopril (ZESTRIL) 10 MG tablet TAKE 1 TABLET (10 MG TOTAL) BY MOUTH DAILY. 90 tablet 3  . metFORMIN (GLUCOPHAGE) 500 MG tablet Take 2 tablets (1,000 mg total) by mouth 2 (two) times daily with a meal. Please schedule follow up (Patient taking differently: Take 1,000 mg by mouth 2 (two) times daily with  a meal. ) 360 tablet 1  . metoprolol tartrate (LOPRESSOR) 25 MG tablet Take 1 tablet (25 mg total) by mouth 2 (two) times daily. 180 tablet 3  . Multiple Vitamin (MULTIVITAMIN WITH MINERALS) TABS tablet Take 1 tablet by mouth daily.    Marland Kitchen sulfamethoxazole-trimethoprim (BACTRIM DS) 800-160 MG tablet Take 1 tablet by mouth 2 (two) times daily. 40 tablet 0  . vitamin B-12 (CYANOCOBALAMIN) 1000 MCG tablet Take 1,000 mcg by mouth daily.    Marland Kitchen azelastine (ASTELIN) 0.1 % nasal spray Place 2 sprays into both nostrils 2 (two) times daily. Use in each nostril as directed 30 mL 1  . Blood Glucose Monitoring Suppl (FREESTYLE LITE) DEVI     . cetirizine (ZYRTEC) 10 MG tablet Take 1 tablet (10 mg total) by mouth daily. 30 tablet 11  . glucose blood test strip Use as instructed to check 1-2x a day 100 each 12  . Lancets MISC Check sugars 3 times a day E11.9 100 each 0  . sildenafil (REVATIO) 20 MG tablet 2-5 tab po prn  one hour before sex.(max 100 mg in 24 hours period. 100 tablet 0  . sildenafil (VIAGRA) 50 MG tablet Take 1 tablet (50 mg total) by mouth daily as needed for erectile dysfunction. 10 tablet 0  . sulfamethoxazole-trimethoprim (BACTRIM DS) 800-160 MG tablet Take 1 tablet by mouth 2 (two) times daily. 20 tablet 0    Results for orders placed or performed during the hospital encounter of 01/04/20 (from the past 48 hour(s))  SARS CORONAVIRUS 2 (TAT 6-24 HRS) Nasopharyngeal Nasopharyngeal Swab     Status: None   Collection Time: 01/04/20 10:20 AM   Specimen: Nasopharyngeal Swab  Result Value Ref Range   SARS Coronavirus 2 NEGATIVE NEGATIVE    Comment: (NOTE) SARS-CoV-2 target nucleic acids are NOT DETECTED. The SARS-CoV-2 RNA is generally detectable in upper and lower respiratory specimens during the acute phase of infection. Negative results do not preclude SARS-CoV-2 infection, do not rule out co-infections with other pathogens, and should not be used as the sole basis for treatment or other  patient management decisions. Negative results must be combined with clinical observations, patient history, and epidemiological information. The expected result is Negative. Fact Sheet for Patients: SugarRoll.be Fact Sheet for Healthcare Providers: https://www.woods-mathews.com/ This test is not yet approved or cleared by the [place] FDA and  has been authorized for detection and/or diagnosis of SARS-CoV-2 by FDA under an Emergency Use Authorization (EUA). This EUA will remain  in effect (meaning this test can be used) for the duration of the COVID-19 declaration under Section 56 4(b)(1) of the Act, 21 U.S.C. section 360bbb-3(b)(1), unless the  authorization is terminated or revoked sooner. Performed at [facility], [place] [address], [place], [place] [address]    No results found.  Review of Systems  All other systems reviewed and are negative.   There were no vitals taken for this visit. Physical Exam  Patient is alert, oriented, no adenopathy, well-dressed, normal affect, normal respiratory effort. Examination patient has cellulitis to the midfoot on the right he has a strong dorsalis pedis pulse there is a 1 cm ulcer beneath the second metatarsal head that probes 4 cm deep extends to the dorsum of his foot.  He has good hair growth on the foot.  Review of the radiographs shows periosteal elevation along the proximal phalanx consistent with chronic osteomyelitis.  Patient ha 1. Cellulitis of foot, right   2. Subacute osteomyelitis, right ankle and foot ([place])     Plan: With the osteomyelitis involving the second toe I have recommended proceeding with a second ray amputation right foot.  Recommended patient get a kneeling scooter at this time and practice on using it prior to surgery.  Discussed that we will need to keep his weight off the foot for 3 weeks and then he can advance into regular shoes.  We will plan for a right  foot second ray amputation at home and on Wednesday refill prescription provided for Bactrim DS.  Risks and benefits were discussed including risk of the wound not healing.  Patient states he understands wished to proceed at this time.  Plan for outpatient surgery. s sausage digit swelling of the second toe. Assessment/Plan  Bevely Palmer Susie Ehresman, PA 01/06/2020, 6:38 AM

## 2020-01-06 NOTE — Anesthesia Procedure Notes (Signed)
Anesthesia Regional Block: Ankle block   Pre-Anesthetic Checklist: ,, timeout performed, Correct Patient, Correct Site, Correct Laterality, Correct Procedure, Correct Position, site marked, Risks and benefits discussed, pre-op evaluation,  At surgeon's request and post-op pain management  Laterality: Right  Prep: Maximum Sterile Barrier Precautions used, chloraprep       Needles:  Injection technique: Single-shot  Needle Type: Echogenic Needle     Needle Length: 4cm  Needle Gauge: 25     Additional Needles:   (landmark technique)  Narrative:  Start time: 01/06/2020 7:52 AM End time: 01/06/2020 7:55 AM  Performed by: Personally  Anesthesiologist: Brennan Bailey, MD  Additional Notes: Risks, benefits, and alternative discussed. Patient gave consent for procedure. Patient prepped and draped in sterile fashion. Sedation administered, patient remains easily responsive to voice. Local anesthetic given in 5cc increments with no signs or symptoms of intravascular injection. No pain or paraesthesias with injection. Patient monitored throughout procedure with signs of LAST or immediate complications. Tolerated well.   Tawny Asal, MD

## 2020-01-06 NOTE — Transfer of Care (Signed)
Immediate Anesthesia Transfer of Care Note  [name]  Procedure(s) Performed: RIGHT FOOT 2ND RAY AMPUTATION (Right )  Patient Location: PACU  Anesthesia Type:MAC  Level of Consciousness: awake, alert  and oriented  Airway & Oxygen Therapy: Patient Spontanous Breathing and Patient connected to nasal cannula oxygen  Post-op Assessment: Report given to RN, Post -op Vital signs reviewed and stable and Patient moving all extremities X 4  Post vital signs: Reviewed and stable  Last Vitals:  Vitals Value Taken Time  BP    Temp    Pulse 73 01/06/20 0903  Resp 19 01/06/20 0903  SpO2 95 % 01/06/20 0903  Vitals shown include unvalidated device data.  Last Pain:  Vitals:   01/06/20 0711  TempSrc: Oral  PainSc: 0-No pain         Complications: No apparent anesthesia complications

## 2020-01-06 NOTE — Op Note (Signed)
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  SURGEON:  Newt Minion, MD  PHYSICIAN ASSISTANT:None ANESTHESIA:   General  PREOPERATIVE INDICATIONS:  Joshua Branch is a  59 y.o. male with a diagnosis of Osteomyelitis Right 2nd Metatarsal who failed conservative measures and elected for surgical management.    The risks benefits and alternatives were discussed with the patient preoperatively including but not limited to the risks of infection, bleeding, nerve injury, cardiopulmonary complications, the need for revision surgery, among others, and the patient was willing to proceed.  OPERATIVE IMPLANTS: none  @ENCIMAGES @  OPERATIVE FINDINGS: margins clear  OPERATIVE PROCEDURE: Patient was brought the operating room and underwent an ankle block.  After adequate levels anesthesia were obtained patient's right lower extremity was prepped using DuraPrep draped into a sterile field a timeout was called.  A V incision was made around the ulcerative tissue and the second ray.  The second ray and ulcerative tissue was resected in 1 block of tissue.  This left a wound that was 3 x 13 cm.  Electrocautery was used for hemostasis the wound was irrigated with normal saline the wound margins were clear no signs of infection at the wound margins.  Local tissue rearrangement was used to close the wound 3 x 13 cm.  A compression dressing was applied patient was taken the PACU in stable condition.   DISCHARGE PLANNING:  Antibiotic duration: Preoperative antibiotics  Weightbearing: Nonweightbearing on the right  Pain medication: Prescription for tramadol  Dressing care/ Wound VAC: Follow-up in the office 1 week to change the dressing  Ambulatory devices: Crutches or kneeling scooter  Discharge to:  Home.  Follow-up: In the office 1 week post operative.

## 2020-01-06 NOTE — Anesthesia Postprocedure Evaluation (Signed)
Anesthesia Post Note  Patient: MAIKOL GRASSIA  Procedure(s) Performed: RIGHT FOOT 2ND RAY AMPUTATION (Right )     Patient location during evaluation: PACU Anesthesia Type: Regional Level of consciousness: awake and alert and oriented Pain management: pain level controlled Vital Signs Assessment: post-procedure vital signs reviewed and stable Respiratory status: spontaneous breathing, nonlabored ventilation and respiratory function stable Cardiovascular status: blood pressure returned to baseline Postop Assessment: no apparent nausea or vomiting Anesthetic complications: no    Last Vitals:  Vitals:   01/06/20 0750 01/06/20 0905  BP: 136/64   Pulse: 82   Resp: 17   Temp:  (!) 36.1 C  SpO2: 100%     Last Pain:  Vitals:   01/06/20 0905  TempSrc:   PainSc: 0-No pain                 Brennan Bailey

## 2020-01-07 ENCOUNTER — Encounter (HOSPITAL_COMMUNITY): Payer: Self-pay | Admitting: Orthopedic Surgery

## 2020-01-11 ENCOUNTER — Ambulatory Visit (INDEPENDENT_AMBULATORY_CARE_PROVIDER_SITE_OTHER): Payer: 59 | Admitting: Physician Assistant

## 2020-01-11 ENCOUNTER — Encounter: Payer: Self-pay | Admitting: Orthopedic Surgery

## 2020-01-11 ENCOUNTER — Encounter: Payer: Self-pay | Admitting: Medical

## 2020-01-11 ENCOUNTER — Other Ambulatory Visit: Payer: Self-pay

## 2020-01-11 VITALS — Ht 75.0 in | Wt 239.0 lb

## 2020-01-11 DIAGNOSIS — M869 Osteomyelitis, unspecified: Secondary | ICD-10-CM

## 2020-01-11 NOTE — Progress Notes (Signed)
Office Visit Note   [name]           Date of Birth: [birth year]           MRN: [id number] Visit Date: 01/11/2020              Requested by: Mackie Pai, PA-C [place],  [place] [address] PCP: Mackie Pai, PA-C  Chief Complaint  Patient presents with  . Right Foot - Routine Post Op    01/06/20 right foot 2nd ray amputation       HPI: The patient is a pleasant 59 year old gentleman who is 5 days status post right foot second ray amputation.  He did have significant bleeding and was asked to come in a couple days early.  He is on blood thinners.  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  Plan: Status post above procedure wound well approximated patient should continue to try and elevate nonweightbearing.  I am okay with him returning to work as long as he limits his foot being down which he says he can do  Follow-Up Instructions: No follow-ups on file.   Ortho Exam  Patient is alert, oriented, no adenopathy, well-dressed, normal affect, normal respiratory effort. Focused examination of his right foot demonstrates well approximated wound edges.  He does have a little bit of swelling focused under the wound but no cellulitis probably consistent with a small amount of hematoma which could not be expressed today without reopening the wound.  Otherwise swelling is well controlled no necrosis  Imaging: No results found. No images are attached to the encounter.  Labs: Lab Results  Component Value Date   HGBA1C 7.8 (H) 05/08/2019   HGBA1C 8.2 (H) 06/23/2018   HGBA1C 11.7 (H) 01/17/2018     Lab Results  Component Value Date   ALBUMIN 3.9 05/08/2019   ALBUMIN 2.7 (L) 06/23/2018   ALBUMIN 4.4 01/17/2018    Lab Results  Component Value Date   MG 2.1 05/08/2019   MG 2.2 06/26/2018   MG 3.0 (H) 06/26/2018   No results  found for: VD25OH  No results found for: PREALBUMIN CBC EXTENDED Latest Ref Rng & Units 01/06/2020 12/24/2019 06/28/2018  WBC 4.0 - 10.5 K/uL 8.9 9.3 12.9(H)  RBC 4.22 - 5.81 MIL/uL 4.21(L) 4.53 3.85(L)  HGB 13.0 - 17.0 g/dL 12.6(L) 13.8 11.4(L)  HCT 39 - 52 % 39.0 40.0 34.4(L)  PLT 150 - 400 K/uL 411(H) 313 279  NEUTROABS 1,500 - 7,800 cells/uL - 6,901 -  LYMPHSABS 850 - 3,900 cells/uL - 1,125 -     Body mass index is 29.87 kg/m.  Orders:  No orders of the defined types were placed in this encounter.  No orders of the defined types were placed in this encounter.    Procedures: No procedures performed  Clinical Data: No additional findings.  ROS:  All other systems negative, except as noted in the HPI. Review of Systems  Objective: Vital Signs: Ht 6\' 3"  (1.905 m)   Wt 239 lb (108.4 kg)   BMI 29.87 kg/m   Specialty Comments:  No specialty comments available.  PMFS History: Patient Active Problem List   Diagnosis Date Noted  . Osteomyelitis of second toe of right foot ([place])   . Poorly controlled type 2 diabetes mellitus with circulatory disorder ([facility]) 07/15/2018  .  Numbness in both hands 07/15/2018  . 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 Acute coronary syndrome ([facility]) 06/22/2018  . Nonhealing skin ulcer ([facility]) 06/22/2018  . Coronary artery disease involving native coronary artery with unstable angina pectoris ([place]) 06/22/2018  . Acute myocardial infarction ([place]) 06/22/2018  . Hyperlipidemia   . 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 Vertigo 08/20/2016  . Myringotomy tube status 08/20/2016  . Ear fullness, left 08/20/2016  . Asymmetrical left sensorineural hearing loss 02/06/2016  . Neuropathy of left foot 02/03/2015  . Charcot's joint of left foot, non-diabetic 02/03/2015  . 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 Cramping of hands 06/02/2014  . Eustachian tube dysfunction 06/02/2014  . HTN (hypertension) 05/07/2014  . Obstructive sleep apnea  05/07/2014   Past Medical History:  Diagnosis Date  . Acute myocardial infarction ([facility]) 06/22/2018  . Allergy   . Charcot's joint of left foot, non-diabetic   . Coronary artery disease involving native coronary artery with unstable angina pectoris ([facility] [place]) 06/22/2018  . Diabetes ([place])    type 2  . GERD (gastroesophageal reflux disease)   . History of kidney stones    passed  . Hyperlipidemia   . Hypertension   . Neuropathy of left foot   . Pneumonia    As a child  . Right foot ulcer ([place] [place])   . 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 Type II diabetes mellitus with complication, uncontrolled ([facility])   . Uncontrolled type 2 diabetes mellitus ([name]     Family History  Problem Relation Age of Onset  . Diabetes Mother   . Stroke Father   . Hypertension Father   . Hearing loss Father   . Colon polyps Father   . Colon cancer Neg Hx   . Esophageal cancer Neg Hx   . Prostate cancer Neg Hx     Past Surgical History:  Procedure Laterality Date  . AMPUTATION Right 01/06/2020   Procedure: RIGHT FOOT 2ND RAY AMPUTATION;  Surgeon: Newt Minion, MD;  Location: [facility];  Service: Orthopedics;  Laterality: Right;  . COLONOSCOPY W/ POLYPECTOMY    . CORONARY ARTERY BYPASS GRAFT N/A 06/25/2018   Procedure: CORONARY ARTERY BYPASS GRAFTING (CABG) TIMES FOUR USING LEFT INTERNAL MAMMARY ARTERY AND RIGHT ENDOSCOPICALLY HARVESTED SAPHENOUS VEIN;  Surgeon: Rexene Alberts, MD;  Location: [facility];  Service: Open Heart Surgery;  Laterality: N/A;  . ENDOVEIN HARVEST OF GREATER SAPHENOUS VEIN Right 06/25/2018   Procedure: ENDOVEIN HARVEST OF GREATER SAPHENOUS VEIN;  Surgeon: Rexene Alberts, MD;  Location: [facility];  Service: Open Heart Surgery;  Laterality: Right;  . LEFT HEART CATH AND CORONARY ANGIOGRAPHY N/A 06/22/2018   Procedure: LEFT HEART CATH AND CORONARY ANGIOGRAPHY;  Surgeon: Nigel Mormon, MD;  Location: [facility];   Service: Cardiovascular;  Laterality: N/A;  . surgical debridement  Right    Right foot ulcer  . TEE WITHOUT CARDIOVERSION N/A 06/25/2018   Procedure: TRANSESOPHAGEAL ECHOCARDIOGRAM (TEE);  Surgeon: Rexene Alberts, MD;  Location: [facility];  Service: Open Heart Surgery;  Laterality: N/A;   Social History   Occupational History  . Occupation: attorney  Tobacco Use  . Smoking status: Never Smoker  . Smokeless tobacco: Never Used  Vaping Use  . Vaping Use: Never used  Substance and Sexual Activity  . Alcohol use: Yes    Alcohol/week: 2.0  standard drinks    Types: 2 Cans of beer per week    Comment: 2 cans of beer or 2 borbon  . Drug use: Never  . Sexual activity: Not on file

## 2020-01-13 ENCOUNTER — Telehealth: Payer: Self-pay

## 2020-01-13 NOTE — Telephone Encounter (Signed)
Patient called in for handicap sticker to put in window after surgery.

## 2020-01-14 NOTE — Telephone Encounter (Signed)
Patient was called and informed placard is ready for pickup.

## 2020-01-15 ENCOUNTER — Ambulatory Visit: Payer: 59 | Admitting: Family

## 2020-01-18 ENCOUNTER — Other Ambulatory Visit: Payer: Self-pay | Admitting: Internal Medicine

## 2020-01-18 MED FILL — FREESTYLE LITE TEST STRIP: 50 days supply | Qty: 100 | Fill #0

## 2020-01-18 MED FILL — LISINOPRIL 10 MG TABS: 10 | 90 days supply | Qty: 90 | Fill #2

## 2020-01-18 MED FILL — INVOKANA 100 MG TABLET: 100 | 90 days supply | Qty: 90 | Fill #1

## 2020-01-19 ENCOUNTER — Ambulatory Visit (INDEPENDENT_AMBULATORY_CARE_PROVIDER_SITE_OTHER): Payer: 59 | Admitting: Physician Assistant

## 2020-01-19 ENCOUNTER — Other Ambulatory Visit: Payer: Self-pay | Admitting: Internal Medicine

## 2020-01-19 ENCOUNTER — Other Ambulatory Visit: Payer: Self-pay

## 2020-01-19 ENCOUNTER — Encounter: Payer: Self-pay | Admitting: Family

## 2020-01-19 VITALS — Ht 75.0 in | Wt 239.0 lb

## 2020-01-19 DIAGNOSIS — M869 Osteomyelitis, unspecified: Secondary | ICD-10-CM

## 2020-01-19 MED FILL — METFORMIN HCL 500 MG TABS: 500 | 90 days supply | Qty: 180 | Fill #0

## 2020-01-19 NOTE — Progress Notes (Signed)
Office Visit Note   [name]           Date of Birth: 1961/01/14           MRN: [id number] Visit Date: 01/19/2020              Requested by: Mackie Pai, PA-C [place],  [place] [address] PCP: Mackie Pai, PA-C  Chief Complaint  Patient presents with  . Right Foot - Routine Post Op    01/06/20 right foot 2nd ray amputation       HPI: This is a pleasant gentleman who is 2 weeks status post right foot second ray amputation.  He did have quite a bit of bleeding the first week.  This has since resolved.  He is here for routine follow-up.  He has no concerns.  He did notice a slight odor  Assessment & Plan: Visit Diagnoses: No diagnosis found.  Plan: Patient will follow up in a week.  Emphasized the importance of elevating his foot.  Daily mild soap mild soap and water  Follow-Up Instructions: No follow-ups on file.   Ortho Exam  Patient is alert, oriented, no adenopathy, well-dressed, normal affect, normal respiratory effort. Focused examination of his right foot demonstrates some focal swelling right over the dorsal incision.  Probably consistent with a small clot beneath the skin but there is no cellulitis no drainage and overall the wound is healing well.  He still has some mild wound dehiscence in the webspace.  Also also resolving on the plantar surface of the foot no foul odor no drainage although he has some bloody drainage on the dressing.  Pulses are intact   Imaging: No results found. No images are attached to the encounter.  Labs: Lab Results  Component Value Date   HGBA1C 7.8 (H) 05/08/2019   HGBA1C 8.2 (H) 06/23/2018   HGBA1C 11.7 (H) 01/17/2018     Lab Results  Component Value Date   ALBUMIN 3.9 05/08/2019   ALBUMIN 2.7 (L) 06/23/2018   ALBUMIN 4.4 01/17/2018    Lab Results  Component Value Date   MG 2.1 05/08/2019   MG 2.2 06/26/2018   MG 3.0 (H) 06/26/2018   No results found for: VD25OH  No  results found for: PREALBUMIN CBC EXTENDED Latest Ref Rng & Units 01/06/2020 12/24/2019 06/28/2018  WBC 4.0 - 10.5 K/uL 8.9 9.3 12.9(H)  RBC 4.22 - 5.81 MIL/uL 4.21(L) 4.53 3.85(L)  HGB 13.0 - 17.0 g/dL 12.6(L) 13.8 11.4(L)  HCT 39 - 52 % 39.0 40.0 34.4(L)  PLT 150 - 400 K/uL 411(H) 313 279  NEUTROABS 1,500 - 7,800 cells/uL - 6,901 -  LYMPHSABS 850 - 3,900 cells/uL - 1,125 -     Body mass index is 29.87 kg/m.  Orders:  No orders of the defined types were placed in this encounter.  No orders of the defined types were placed in this encounter.    Procedures: No procedures performed  Clinical Data: No additional findings.  ROS:  All other systems negative, except as noted in the HPI. Review of Systems  Objective: Vital Signs: Ht 6\' 3"  (1.905 m)   Wt 239 lb (108.4 kg)   BMI 29.87 kg/m   Specialty Comments:  No specialty comments available.  PMFS History: Patient Active Problem List   Diagnosis Date Noted  . Osteomyelitis of second toe of right foot ([facility])   . Poorly controlled type 2 diabetes mellitus with circulatory disorder ([facility]) 07/15/2018  .  Numbness in both hands 07/15/2018  . 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 Acute coronary syndrome ([place] [facility]) 06/22/2018  . Nonhealing skin ulcer ([place]) 06/22/2018  . Coronary artery disease involving native coronary artery with unstable angina pectoris ([facility]) 06/22/2018  . Acute myocardial infarction ([place]) 06/22/2018  . Hyperlipidemia   . 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 Vertigo 08/20/2016  . Myringotomy tube status 08/20/2016  . Ear fullness, left 08/20/2016  . Asymmetrical left sensorineural hearing loss 02/06/2016  . Neuropathy of left foot 02/03/2015  . Charcot's joint of left foot, non-diabetic 02/03/2015  . 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 Cramping of hands 06/02/2014  . Eustachian tube dysfunction 06/02/2014  . HTN (hypertension) 05/07/2014  . Obstructive sleep apnea 05/07/2014   Past Medical  History:  Diagnosis Date  . Acute myocardial infarction ([place] [facility]) 06/22/2018  . Allergy   . Charcot's joint of left foot, non-diabetic   . Coronary artery disease involving native coronary artery with unstable angina pectoris ([place]) 06/22/2018  . Diabetes ([facility])    type 2  . GERD (gastroesophageal reflux disease)   . History of kidney stones    passed  . Hyperlipidemia   . Hypertension   . Neuropathy of left foot   . Pneumonia    As a child  . Right foot ulcer ([facility])   . 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 Type II diabetes mellitus with complication, uncontrolled ([facility])   . Uncontrolled type 2 diabetes mellitus ([name]     Family History  Problem Relation Age of Onset  . Diabetes Mother   . Stroke Father   . Hypertension Father   . Hearing loss Father   . Colon polyps Father   . Colon cancer Neg Hx   . Esophageal cancer Neg Hx   . Prostate cancer Neg Hx     Past Surgical History:  Procedure Laterality Date  . AMPUTATION Right 01/06/2020   Procedure: RIGHT FOOT 2ND RAY AMPUTATION;  Surgeon: Newt Minion, MD;  Location: [facility];  Service: Orthopedics;  Laterality: Right;  . COLONOSCOPY W/ POLYPECTOMY    . CORONARY ARTERY BYPASS GRAFT N/A 06/25/2018   Procedure: CORONARY ARTERY BYPASS GRAFTING (CABG) TIMES FOUR USING LEFT INTERNAL MAMMARY ARTERY AND RIGHT ENDOSCOPICALLY HARVESTED SAPHENOUS VEIN;  Surgeon: Rexene Alberts, MD;  Location: [facility];  Service: Open Heart Surgery;  Laterality: N/A;  . ENDOVEIN HARVEST OF GREATER SAPHENOUS VEIN Right 06/25/2018   Procedure: ENDOVEIN HARVEST OF GREATER SAPHENOUS VEIN;  Surgeon: Rexene Alberts, MD;  Location: [facility];  Service: Open Heart Surgery;  Laterality: Right;  . LEFT HEART CATH AND CORONARY ANGIOGRAPHY N/A 06/22/2018   Procedure: LEFT HEART CATH AND CORONARY ANGIOGRAPHY;  Surgeon: Nigel Mormon, MD;  Location: [facility];  Service: Cardiovascular;   Laterality: N/A;  . surgical debridement  Right    Right foot ulcer  . TEE WITHOUT CARDIOVERSION N/A 06/25/2018   Procedure: TRANSESOPHAGEAL ECHOCARDIOGRAM (TEE);  Surgeon: Rexene Alberts, MD;  Location: [facility];  Service: Open Heart Surgery;  Laterality: N/A;   Social History   Occupational History  . Occupation: attorney  Tobacco Use  . Smoking status: Never Smoker  . Smokeless tobacco: Never Used  Vaping Use  . Vaping Use: Never used  Substance and Sexual Activity  . Alcohol use: Yes    Alcohol/week: 2.0  standard drinks    Types: 2 Cans of beer per week    Comment: 2 cans of beer or 2 borbon  . Drug use: Never  . Sexual activity: Not on file

## 2020-01-21 ENCOUNTER — Encounter: Payer: Self-pay | Admitting: Orthopedic Surgery

## 2020-01-25 ENCOUNTER — Encounter: Payer: Self-pay | Admitting: Medical

## 2020-01-25 ENCOUNTER — Other Ambulatory Visit: Payer: Self-pay

## 2020-01-25 ENCOUNTER — Ambulatory Visit (INDEPENDENT_AMBULATORY_CARE_PROVIDER_SITE_OTHER): Payer: 59 | Admitting: Physician Assistant

## 2020-01-25 ENCOUNTER — Encounter: Payer: Self-pay | Admitting: Physician Assistant

## 2020-01-25 VITALS — Ht 75.0 in | Wt 239.0 lb

## 2020-01-25 DIAGNOSIS — M5410 Radiculopathy, site unspecified: Secondary | ICD-10-CM

## 2020-01-25 MED ORDER — DOXYCYCLINE HYCLATE 100 MG PO TABS: 100.0000 mg | ORAL_TABLET | Freq: Two times a day (BID) | ORAL | 0 refills | Status: DC

## 2020-01-25 MED FILL — DOXYCYCLINE HYCLATE 100 MG: 100 | 30 days supply | Qty: 60 | Fill #0

## 2020-01-25 NOTE — Progress Notes (Signed)
Office Visit Note   [name]           Date of Birth: 1961/07/21           MRN: [id number] Visit Date: 01/25/2020              Requested by: Mackie Pai, PA-C [place],   [address] PCP: Mackie Pai, PA-C  Chief Complaint  Patient presents with  . Right Foot - Routine Post Op    01/06/20 right foot 2nd ray amputation         HPI: This is a pleasant 59 year old gentleman who is now almost 3 weeks status post right second ray amputation.  He has been using a knee scooter and elevating his foot on his desk at work.  While he is here he is also complaining about an ulcer on the bottom of his left plantar forefoot.  He has been treating this for a long time and it does not seem to improve.  He is also complaining of that his neck seems to "crack a lot "  Assessment & Plan: Visit Diagnoses:  1. Radiculopathy affecting upper extremity     Plan: Patient was seen today directly by Dr. Sharol Given.  Cultures grew out MRSA and he finished his Bactrim on Friday.  Do have concerns for wound healing especially on the plantar surface.  This area of the foot after verbal consent was debrided.  There was a small amount of purulent drainage.  We will place him back on doxycycline.  He is to refrain from weightbearing in this area.  Follow-up in 1 week.  Given his weakness in his upper extremities and his hand atrophy we recommend an MRI of his cervical spine.  With regards to the left foot he will try to offload this with a postop shoe with a donut in it. Follow-Up Instructions: No follow-ups on file.   Ortho Exam  Patient is alert, oriented, no adenopathy, well-dressed, normal affect, normal respiratory effort. Right foot: Mild amount of soft tissue swelling.  The dorsal part of the second ray incision is healed.  As you go into the website and across the plantar surface there is some wound dehiscence.  There was a mild amount of purulent drainage.   This was all debrided to healthy bleeding wound edges.  No surrounding cellulitis. Left foot: He has a 2 cm circumferential ulcer that does not probe deeply.  There is good vascular wound bed.  No surrounding cellulitis. Focused examination of his spine he does have bilateral significant hand atrophy with some bilateral decrease in strength.  Imaging: No results found. No images are attached to the encounter.  Labs: Lab Results  Component Value Date   HGBA1C 7.8 (H) 05/08/2019   HGBA1C 8.2 (H) 06/23/2018   HGBA1C 11.7 (H) 01/17/2018     Lab Results  Component Value Date   ALBUMIN 3.9 05/08/2019   ALBUMIN 2.7 (L) 06/23/2018   ALBUMIN 4.4 01/17/2018    Lab Results  Component Value Date   MG 2.1 05/08/2019   MG 2.2 06/26/2018   MG 3.0 (H) 06/26/2018   No results found for: VD25OH  No results found for: PREALBUMIN CBC EXTENDED Latest Ref Rng & Units 01/06/2020 12/24/2019 06/28/2018  WBC 4.0 - 10.5 K/uL 8.9 9.3 12.9(H)  RBC 4.22 - 5.81 MIL/uL 4.21(L) 4.53 3.85(L)  HGB 13.0 - 17.0 g/dL 12.6(L) 13.8 11.4(L)  HCT 39 - 52 % 39.0 40.0 34.4(L)  PLT 150 - 400 K/uL 411(H) 313 279  NEUTROABS 1,500 - 7,800 cells/uL - 6,901 -  LYMPHSABS 850 - 3,900 cells/uL - 1,125 -     Body mass index is 29.87 kg/m.  Orders:  Orders Placed This Encounter  Procedures  . MR Cervical Spine w/o contrast   Meds ordered this encounter  Medications  . doxycycline (VIBRA-TABS) 100 MG tablet    Sig: Take 1 tablet (100 mg total) by mouth 2 (two) times daily.    Dispense:  60 tablet    Refill:  0     Procedures: No procedures performed  Clinical Data: No additional findings.  ROS:  All other systems negative, except as noted in the HPI. Review of Systems  Objective: Vital Signs: Ht 6\' 3"  (1.905 m)   Wt 239 lb (108.4 kg)   BMI 29.87 kg/m   Specialty Comments:  No specialty comments available.  PMFS History: Patient Active Problem List   Diagnosis Date Noted  . Osteomyelitis of  second toe of right foot ([facility])   . Poorly controlled type 2 diabetes mellitus with circulatory disorder ([facility]) 07/15/2018  . Numbness in both hands 07/15/2018  . 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 Acute coronary syndrome ([facility]) 06/22/2018  . Nonhealing skin ulcer ([facility]) 06/22/2018  . Coronary artery disease involving native coronary artery with unstable angina pectoris ([facility]) 06/22/2018  . Acute myocardial infarction ([facility]) 06/22/2018  . Hyperlipidemia   . 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 Vertigo 08/20/2016  . Myringotomy tube status 08/20/2016  . Ear fullness, left 08/20/2016  . Asymmetrical left sensorineural hearing loss 02/06/2016  . Neuropathy of left foot 02/03/2015  . Charcot's joint of left foot, non-diabetic 02/03/2015  . 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 Cramping of hands 06/02/2014  . Eustachian tube dysfunction 06/02/2014  . HTN (hypertension) 05/07/2014  . Obstructive sleep apnea 05/07/2014   Past Medical History:  Diagnosis Date  . Acute myocardial infarction ([facility]) 06/22/2018  . Allergy   . Charcot's joint of left foot, non-diabetic   . Coronary artery disease involving native coronary artery with unstable angina pectoris ([facility]) 06/22/2018  . Diabetes ([facility])    type 2  . GERD (gastroesophageal reflux disease)   . History of kidney stones    passed  . Hyperlipidemia   . Hypertension   . Neuropathy of left foot   . Pneumonia    As a child  . Right foot ulcer ([place])   . 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 Type II diabetes mellitus with complication, uncontrolled ([facility])   . Uncontrolled type 2 diabetes mellitus ([name]     Family History  Problem Relation Age of Onset  . Diabetes Mother   . Stroke Father   . Hypertension Father   . Hearing loss Father   . Colon polyps Father   . Colon cancer Neg Hx   . Esophageal cancer Neg Hx   . Prostate cancer Neg Hx     Past Surgical  History:  Procedure Laterality Date  . AMPUTATION Right 01/06/2020   Procedure: RIGHT FOOT 2ND RAY AMPUTATION;  Surgeon: Newt Minion, MD;  Location: [facility];  Service: Orthopedics;  Laterality: Right;  . COLONOSCOPY W/ POLYPECTOMY    . CORONARY ARTERY BYPASS GRAFT N/A 06/25/2018   Procedure: CORONARY ARTERY BYPASS GRAFTING (CABG) TIMES FOUR USING LEFT INTERNAL  MAMMARY ARTERY AND RIGHT ENDOSCOPICALLY HARVESTED SAPHENOUS VEIN;  Surgeon: Rexene Alberts, MD;  Location: [facility];  Service: Open Heart Surgery;  Laterality: N/A;  . ENDOVEIN HARVEST OF GREATER SAPHENOUS VEIN Right 06/25/2018   Procedure: ENDOVEIN HARVEST OF GREATER SAPHENOUS VEIN;  Surgeon: Rexene Alberts, MD;  Location: [facility];  Service: Open Heart Surgery;  Laterality: Right;  . LEFT HEART CATH AND CORONARY ANGIOGRAPHY N/A 06/22/2018   Procedure: LEFT HEART CATH AND CORONARY ANGIOGRAPHY;  Surgeon: Nigel Mormon, MD;  Location: [facility];  Service: Cardiovascular;  Laterality: N/A;  . surgical debridement  Right    Right foot ulcer  . TEE WITHOUT CARDIOVERSION N/A 06/25/2018   Procedure: TRANSESOPHAGEAL ECHOCARDIOGRAM (TEE);  Surgeon: Rexene Alberts, MD;  Location: [facility];  Service: Open Heart Surgery;  Laterality: N/A;   Social History   Occupational History  . Occupation: attorney  Tobacco Use  . Smoking status: Never Smoker  . Smokeless tobacco: Never Used  Vaping Use  . Vaping Use: Never used  Substance and Sexual Activity  . Alcohol use: Yes    Alcohol/week: 2.0 standard drinks    Types: 2 Cans of beer per week    Comment: 2 cans of beer or 2 borbon  . Drug use: Never  . Sexual activity: Not on file

## 2020-01-28 ENCOUNTER — Ambulatory Visit: Payer: 59 | Admitting: Orthopedic Surgery

## 2020-02-04 ENCOUNTER — Other Ambulatory Visit: Payer: Self-pay

## 2020-02-04 ENCOUNTER — Encounter: Payer: Self-pay | Admitting: Orthopedic Surgery

## 2020-02-04 ENCOUNTER — Ambulatory Visit (INDEPENDENT_AMBULATORY_CARE_PROVIDER_SITE_OTHER): Payer: 59 | Admitting: Orthopedic Surgery

## 2020-02-04 VITALS — Ht 75.0 in | Wt 239.0 lb

## 2020-02-04 DIAGNOSIS — Z89421 Acquired absence of other right toe(s): Secondary | ICD-10-CM

## 2020-02-04 IMAGING — DX DG CERVICAL SPINE COMPLETE 4+V
5 series · 5 of 5 positions shown · non-contrast
Comparison: None.

CLINICAL DATA: Cervicalgia with radicular symptoms

EXAM:
CERVICAL SPINE - COMPLETE 4+ VIEW

[c-spine lat]
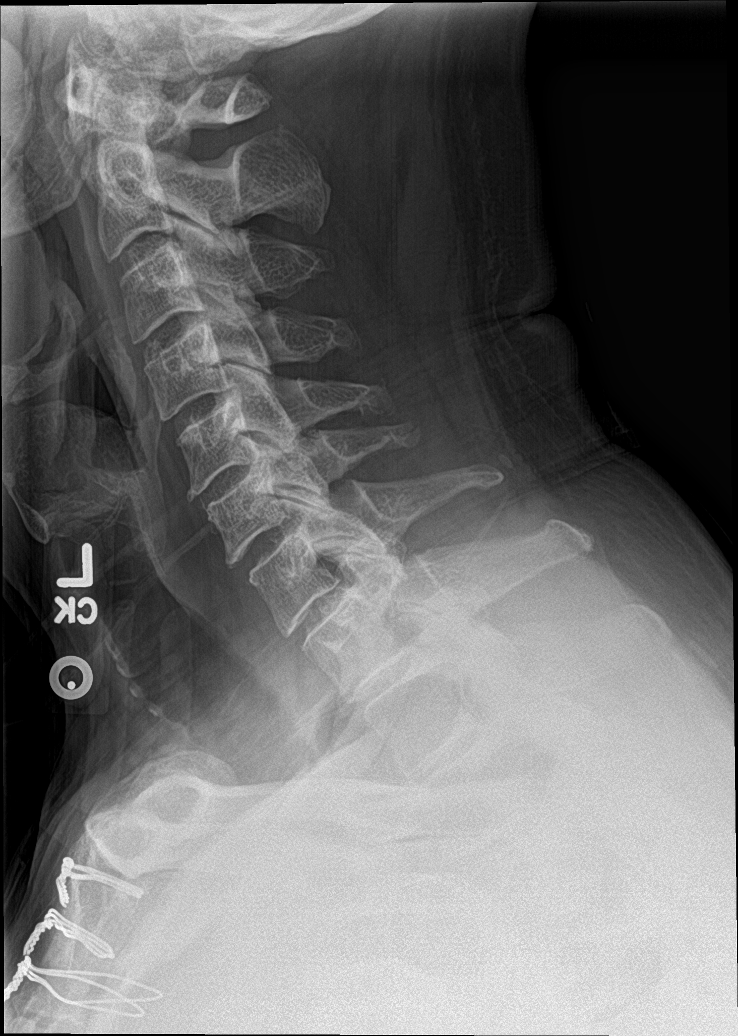

[c-spine obl (1 of 2)]
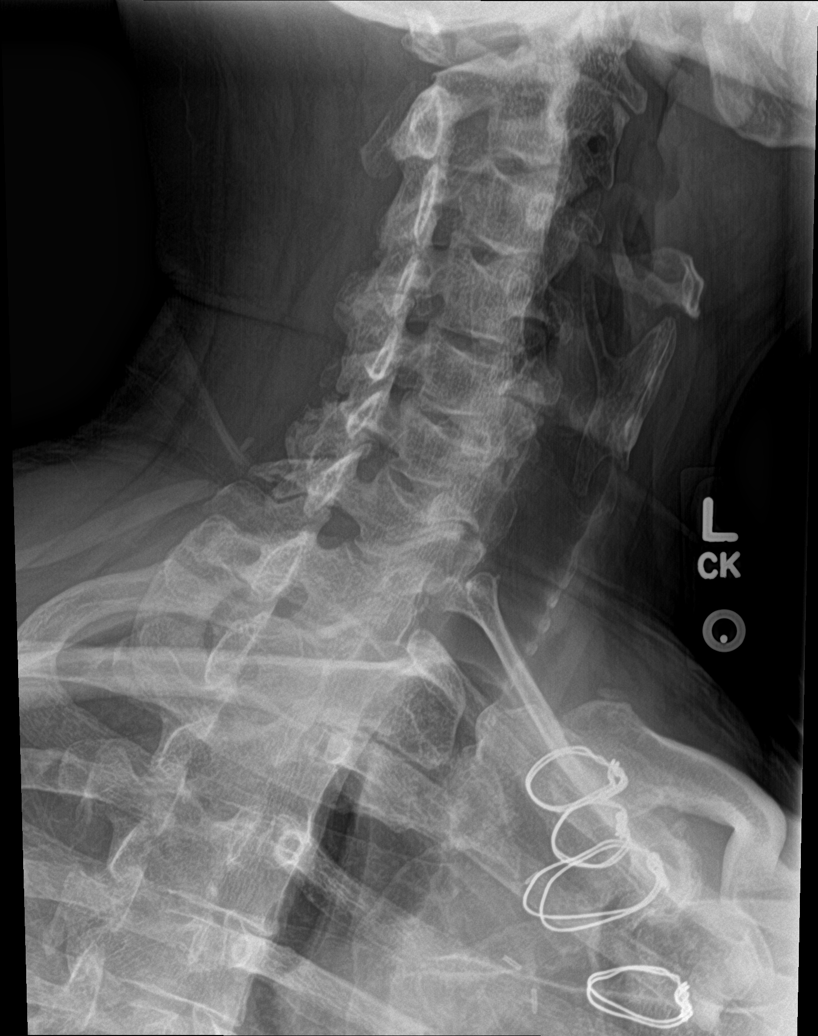

[c-spine obl (2 of 2)]
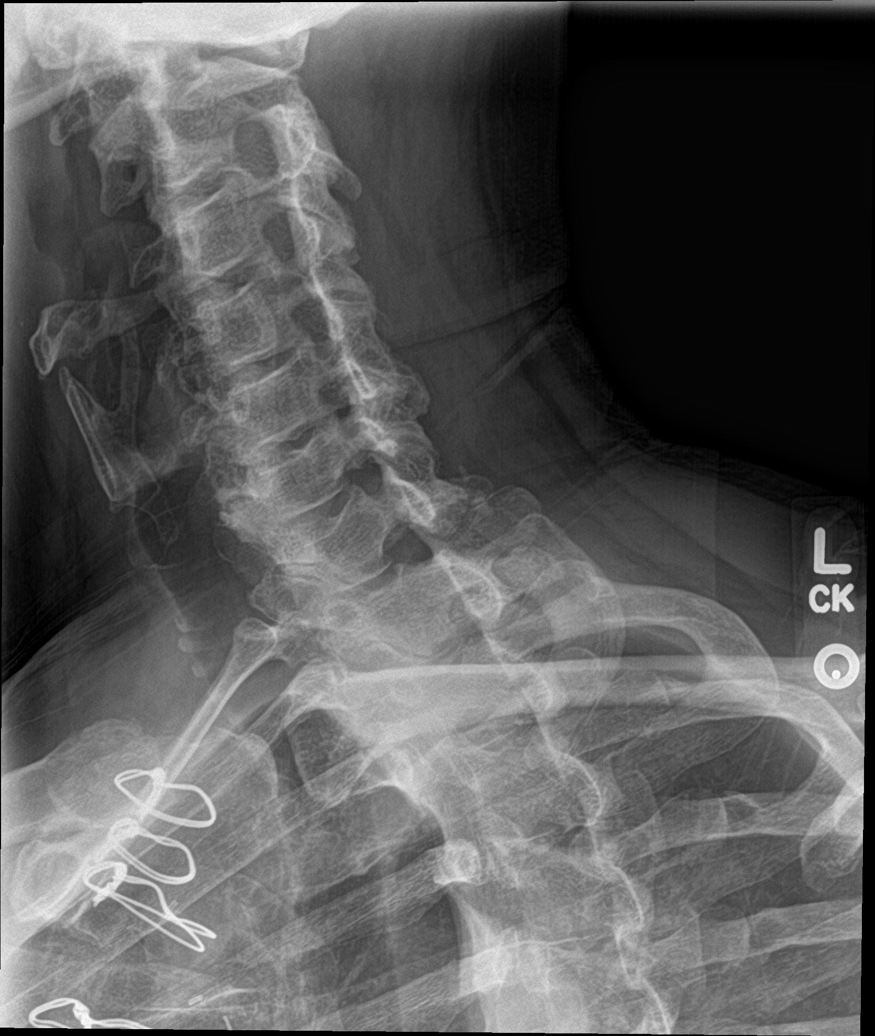

[c-spine ap]
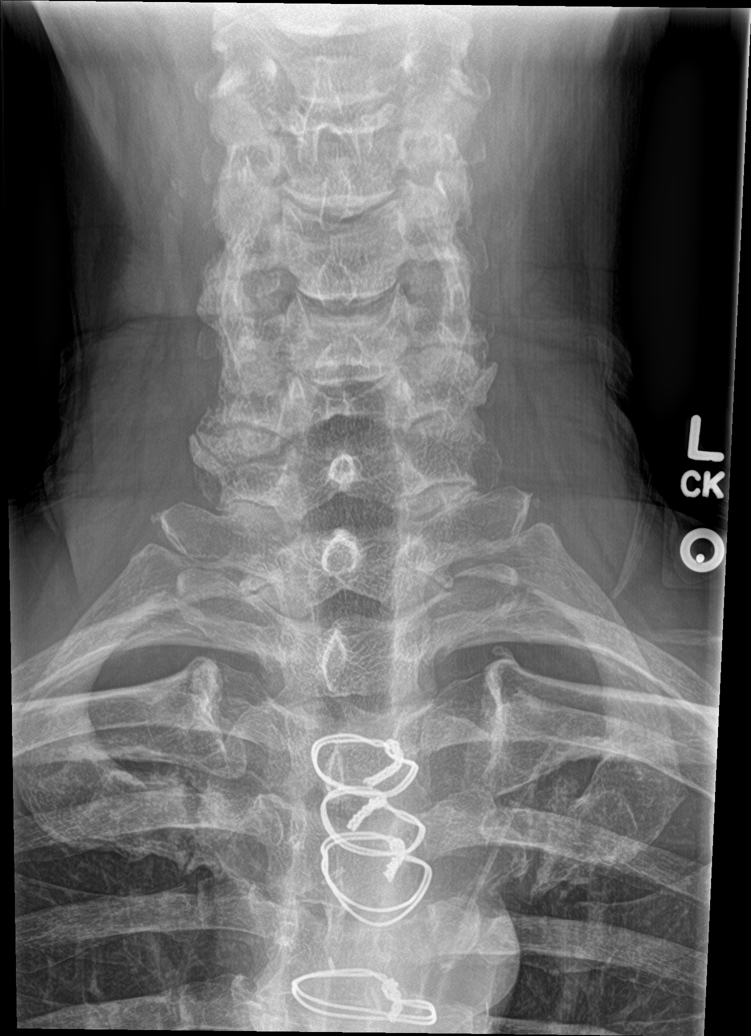

[c-spine open mouth]
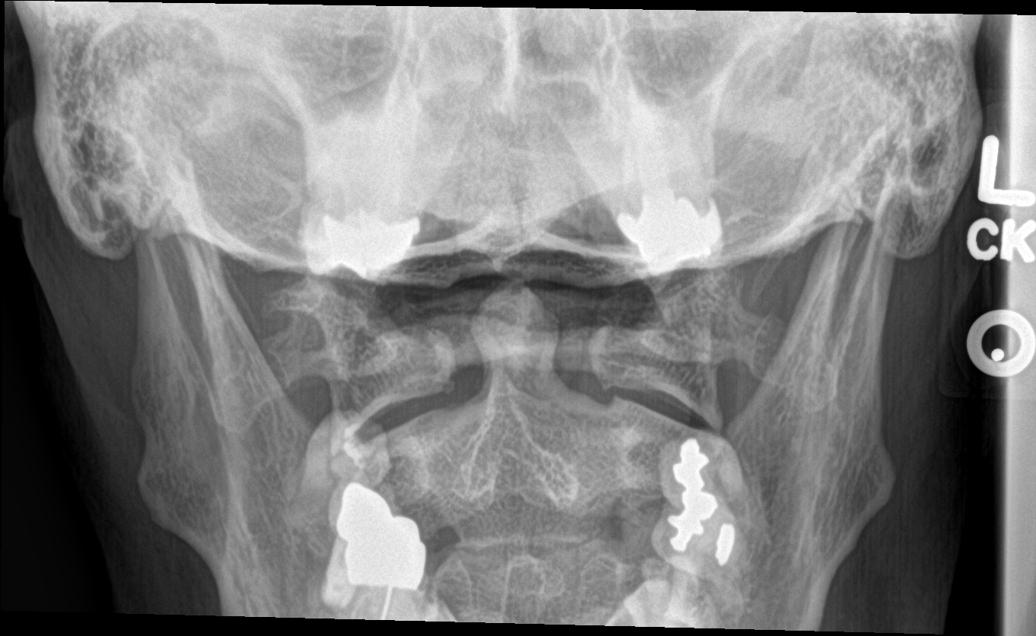

[5 of 5 positions shown; findings below may reference images not displayed]

FINDINGS: Frontal, lateral, open-mouth odontoid, and bilateral oblique views
were obtained. There is no fracture or spondylolisthesis.
Prevertebral soft tissues and predental space regions are normal.
Disc spaces appear unremarkable. There is facet hypertrophy with
exit foraminal narrowing at C3-4, C4-5, and C5-6 bilaterally. Lung
apices are clear.
IMPRESSION: Facet osteoarthritic change at several levels. No appreciable disc
space narrowing. No fracture or spondylolisthesis.

## 2020-02-05 ENCOUNTER — Encounter: Payer: Self-pay | Admitting: Orthopedic Surgery

## 2020-02-05 NOTE — Progress Notes (Signed)
Office Visit Note   [name]           Date of Birth: 11-29-1960           MRN: [id number] Visit Date: 02/04/2020              Requested by: Joshua Pai, PA-C [place],  [place] [address] PCP: Joshua Pai, PA-C  Chief Complaint  Patient presents with   Right Foot - Routine Post Op    01/06/20 right foot 2nd ray amputation       HPI: Patient is a 59 year old gentleman who presents approximately 2 months status post right foot second ray amputation he has been on doxycycline nonweightbearing on a kneeling scooter the sutures are intact.  Assessment & Plan: Visit Diagnoses:  1. 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   Ortho Exam  Patient is alert, oriented, no adenopathy, well-dressed, normal affect, normal respiratory effort. Examination the dorsal incision is healed well there is no redness no cellulitis plantarly he has an ulcer that is 10 x 20 mm and 1 mm deep.  The sutures are harvested.  Imaging: No results found. No images are attached to the encounter.  Labs: Lab Results  Component Value Date   HGBA1C 7.8 (H) 05/08/2019   HGBA1C 8.2 (H) 06/23/2018   HGBA1C 11.7 (H) 01/17/2018     Lab Results  Component Value Date   ALBUMIN 3.9 05/08/2019   ALBUMIN 2.7 (L) 06/23/2018   ALBUMIN 4.4 01/17/2018    Lab Results  Component Value Date   MG 2.1 05/08/2019   MG 2.2 06/26/2018   MG 3.0 (H) 06/26/2018   No results found for: VD25OH  No results found for: PREALBUMIN CBC EXTENDED Latest Ref Rng & Units 01/06/2020 12/24/2019 06/28/2018  WBC 4.0 - 10.5 K/uL 8.9 9.3 12.9(H)  RBC 4.22 - 5.81 MIL/uL 4.21(L) 4.53 3.85(L)  HGB 13.0 - 17.0 g/dL 12.6(L) 13.8 11.4(L)  HCT 39 - 52 % 39.0 40.0 34.4(L)  PLT 150 -  400 K/uL 411(H) 313 279  NEUTROABS 1,500 - 7,800 cells/uL - 6,901 -  LYMPHSABS 850 - 3,900 cells/uL - 1,125 -     Body mass index is 29.87 kg/m.  Orders:  No orders of the defined types were placed in this encounter.  No orders of the defined types were placed in this encounter.    Procedures: No procedures performed  Clinical Data: No additional findings.  ROS:  All other systems negative, except as noted in the HPI. Review of Systems  Objective: Vital Signs: Ht 6\' 3"  (1.905 m)    Wt 239 lb (108.4 kg)    BMI 29.87 kg/m   Specialty Comments:  No specialty comments available.  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 09/19/2016   Vertigo 08/20/2016   Myringotomy tube status 08/20/2016   Ear fullness, left 08/20/2016   Asymmetrical left sensorineural hearing loss 02/06/2016   Neuropathy of left foot 02/03/2015   Charcot's joint of left foot, non-diabetic 02/03/2015   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   Cramping of hands 06/02/2014   Eustachian tube dysfunction 06/02/2014   HTN (hypertension) 05/07/2014   Obstructive sleep apnea 05/07/2014   Past Medical History:  Diagnosis Date   Acute myocardial infarction ([facility]) 06/22/2018   Allergy    Charcot's joint of left foot, non-diabetic    Coronary artery disease involving native coronary artery with unstable angina pectoris ([facility]) 06/22/2018   Diabetes ([facility])    type 2     GERD (gastroesophageal reflux disease)    History of kidney stones    passed   Hyperlipidemia    Hypertension    Neuropathy of left foot    Pneumonia    As a child   Right foot ulcer (Joshua [facility])    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   LIMA to LAD, SVG to Diag, SVG to OM2, SVG to [name], EVH via right thigh and leg   Type II diabetes mellitus with complication, uncontrolled ([facility]ua [facility])    Uncontrolled type 2 diabetes mellitus ([facility])     Family History  Problem Relation Age of Onset   Diabetes Mother    Stroke Father    Hypertension Father    Hearing loss Father    Colon polyps Father    Colon cancer Neg Hx    Esophageal cancer Neg Hx    Prostate cancer Neg Hx     Past Surgical History:  Procedure Laterality Date   AMPUTATION Right 01/06/2020   Procedure: RIGHT FOOT 2ND RAY AMPUTATION;  Surgeon: Joshua Minion, MD;  Location: [facility];  Service: Orthopedics;  Laterality: Right;   COLONOSCOPY W/ POLYPECTOMY     CORONARY ARTERY BYPASS GRAFT N/A 06/25/2018   Procedure: CORONARY ARTERY BYPASS GRAFTING (CABG) TIMES FOUR USING LEFT INTERNAL MAMMARY ARTERY AND RIGHT ENDOSCOPICALLY HARVESTED SAPHENOUS VEIN;  Surgeon: Joshua Alberts, MD;  Location: [facility];  Service: Open Heart Surgery;  Laterality: N/A;   ENDOVEIN HARVEST OF GREATER SAPHENOUS VEIN Right 06/25/2018   Procedure: ENDOVEIN HARVEST OF GREATER SAPHENOUS VEIN;  Surgeon: Joshua Alberts, MD;  Location: [facility];  Service: Open Heart Surgery;  Laterality: Right;   LEFT HEART CATH AND CORONARY ANGIOGRAPHY N/A 06/22/2018   Procedure: LEFT HEART CATH AND CORONARY ANGIOGRAPHY;  Surgeon: Joshua Mormon, MD;  Location: [facility];  Service: Cardiovascular;  Laterality: N/A;   surgical debridement  Right    Right foot ulcer   TEE WITHOUT CARDIOVERSION N/A 06/25/2018   Procedure: TRANSESOPHAGEAL ECHOCARDIOGRAM (TEE);  Surgeon: Joshua Alberts, MD;  Location: [facility];  Service: Open Heart Surgery;  Laterality: N/A;   Social  History   Occupational History   Occupation: attorney  Tobacco Use   Smoking status: Never Smoker   Smokeless tobacco: Never Used  Scientific laboratory technician Use: Never used  Substance and Sexual Activity   Alcohol use: Yes    Alcohol/week: 2.0 standard drinks    Types: 2 Cans of beer per week    Comment: 2 cans of beer or 2 borbon   Drug use: Never   Sexual activity: Not on file

## 2020-02-22 ENCOUNTER — Other Ambulatory Visit: Payer: Self-pay | Admitting: Medical

## 2020-02-22 MED FILL — CLOPIDOGREL 75 MG TABLET: 75 | 90 days supply | Qty: 90 | Fill #2

## 2020-02-22 MED FILL — METOPROLOL TARTRATE 25 MG T: 25 | 90 days supply | Qty: 180 | Fill #2

## 2020-02-22 MED FILL — SILDENAFIL CITRATE 20 MG TA: 20 | 20 days supply | Qty: 100 | Fill #0

## 2020-02-22 MED FILL — ATORVASTATIN 80 MG TABLET: 80 | 90 days supply | Qty: 90 | Fill #2

## 2020-02-23 ENCOUNTER — Encounter: Payer: Self-pay | Admitting: Orthopedic Surgery

## 2020-02-23 ENCOUNTER — Ambulatory Visit (INDEPENDENT_AMBULATORY_CARE_PROVIDER_SITE_OTHER): Payer: 59 | Admitting: Orthopedic Surgery

## 2020-02-23 VITALS — Ht 75.0 in | Wt 239.0 lb

## 2020-02-23 DIAGNOSIS — Z89421 Acquired absence of other right toe(s): Secondary | ICD-10-CM

## 2020-02-23 NOTE — Progress Notes (Signed)
Post-Op Visit Note   [name]           Date of Birth: October 22, 1960           MRN: [id number] Visit Date: 02/23/2020 PCP: Mackie Pai, PA-C  Chief Complaint:  Chief Complaint  Patient presents with  . Right Foot - Routine Post Op    01/06/20 right foot 2nd ray amputation     HPI:  HPI Patient is a 59 year old gentleman seen today status post right foot second ray amputation on June 9.  He has been doing dry dressing changes using a knee scooter for nonweightbearing.  He recently finished his doxycycline course he feels.  On a trip in about 2 Weeks Ortho Exam In the webspace there is an area that had initially dehisced this is 1 cm x 3 mm is superficial filled in with bleeding granulation tissue there is no probing or depth no surrounding erythema or sign of infection  Visit Diagnoses:  1. 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   Imaging: No results found.  Orders:  No orders of the defined types were placed in this encounter.  No orders of the defined types were placed in this encounter.    PMFS History: Patient Active Problem List   Diagnosis Date Noted  . Osteomyelitis of second toe of right foot ([place])   . Poorly controlled type 2 diabetes mellitus with circulatory disorder ([place]) 07/15/2018  . Numbness in both hands 07/15/2018  . 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 Acute coronary syndrome ([place]) 06/22/2018  . Nonhealing skin ulcer ([place]) 06/22/2018  . Coronary artery disease involving native coronary artery with unstable angina pectoris ([place]) 06/22/2018  . Acute myocardial infarction ([place]) 06/22/2018  . Hyperlipidemia   . 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 Vertigo 08/20/2016  . Myringotomy tube status 08/20/2016  . Ear  fullness, left 08/20/2016  . Asymmetrical left sensorineural hearing loss 02/06/2016  . Neuropathy of left foot 02/03/2015  . Charcot's joint of left foot, non-diabetic 02/03/2015  . 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 Cramping of hands 06/02/2014  . Eustachian tube dysfunction 06/02/2014  . HTN (hypertension) 05/07/2014  . Obstructive sleep apnea 05/07/2014   Past Medical History:  Diagnosis Date  . Acute myocardial infarction ([place]) 06/22/2018  . Allergy   . Charcot's joint of left foot, non-diabetic   . Coronary artery disease involving native coronary artery with unstable angina pectoris ([place]) 06/22/2018  . Diabetes ([place])    type 2  . GERD (gastroesophageal reflux disease)   . History of kidney stones    passed  . Hyperlipidemia   . Hypertension   . Neuropathy of left foot   . Pneumonia    As a child  . Right foot ulcer ([place])   . 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 Type II diabetes mellitus with complication, uncontrolled ([place])   . Uncontrolled type 2 diabetes mellitus ([name]     Family History  Problem Relation Age of Onset  . Diabetes Mother   . Stroke Father   . Hypertension Father   . Hearing loss Father   . Colon polyps Father   .  Colon cancer Neg Hx   . Esophageal cancer Neg Hx   . Prostate cancer Neg Hx     Past Surgical History:  Procedure Laterality Date  . AMPUTATION Right 01/06/2020   Procedure: RIGHT FOOT 2ND RAY AMPUTATION;  Surgeon: Newt Minion, MD;  Location: [facility];  Service: Orthopedics;  Laterality: Right;  . COLONOSCOPY W/ POLYPECTOMY    . CORONARY ARTERY BYPASS GRAFT N/A 06/25/2018   Procedure: CORONARY ARTERY BYPASS GRAFTING (CABG) TIMES FOUR USING LEFT INTERNAL MAMMARY ARTERY AND RIGHT ENDOSCOPICALLY HARVESTED SAPHENOUS VEIN;  Surgeon: Rexene Alberts, MD;  Location: [facility];  Service: Open Heart Surgery;  Laterality: N/A;  . ENDOVEIN HARVEST OF GREATER SAPHENOUS VEIN Right  06/25/2018   Procedure: ENDOVEIN HARVEST OF GREATER SAPHENOUS VEIN;  Surgeon: Rexene Alberts, MD;  Location: [facility];  Service: Open Heart Surgery;  Laterality: Right;  . LEFT HEART CATH AND CORONARY ANGIOGRAPHY N/A 06/22/2018   Procedure: LEFT HEART CATH AND CORONARY ANGIOGRAPHY;  Surgeon: Nigel Mormon, MD;  Location: [facility];  Service: Cardiovascular;  Laterality: N/A;  . surgical debridement  Right    Right foot ulcer  . TEE WITHOUT CARDIOVERSION N/A 06/25/2018   Procedure: TRANSESOPHAGEAL ECHOCARDIOGRAM (TEE);  Surgeon: Rexene Alberts, MD;  Location: [facility];  Service: Open Heart Surgery;  Laterality: N/A;   Social History   Occupational History  . Occupation: attorney  Tobacco Use  . Smoking status: Never Smoker  . Smokeless tobacco: Never Used  Vaping Use  . Vaping Use: Never used  Substance and Sexual Activity  . Alcohol use: Yes    Alcohol/week: 2.0 standard drinks    Types: 2 Cans of beer per week    Comment: 2 cans of beer or 2 borbon  . Drug use: Never  . Sexual activity: Not on file

## 2020-02-25 ENCOUNTER — Other Ambulatory Visit: Payer: Self-pay

## 2020-02-25 ENCOUNTER — Ambulatory Visit
Admission: RE | Admit: 2020-02-25 | Discharge: 2020-02-25 | Disposition: A | Discharge status: Home / Self Care | Payer: 59 | Source: Ambulatory Visit | Attending: Physician Assistant | Admitting: Physician Assistant

## 2020-02-25 DIAGNOSIS — M5410 Radiculopathy, site unspecified: Secondary | ICD-10-CM

## 2020-02-25 DIAGNOSIS — M47812 Spondylosis without myelopathy or radiculopathy, cervical region: Secondary | ICD-10-CM | POA: Diagnosis not present

## 2020-02-25 DIAGNOSIS — M5023 Other cervical disc displacement, cervicothoracic region: Secondary | ICD-10-CM | POA: Diagnosis not present

## 2020-02-25 DIAGNOSIS — M4802 Spinal stenosis, cervical region: Secondary | ICD-10-CM | POA: Diagnosis not present

## 2020-03-03 ENCOUNTER — Telehealth (INDEPENDENT_AMBULATORY_CARE_PROVIDER_SITE_OTHER): Payer: 59 | Admitting: Family Medicine

## 2020-03-03 ENCOUNTER — Telehealth: Payer: Self-pay

## 2020-03-03 DIAGNOSIS — R519 Headache, unspecified: Secondary | ICD-10-CM

## 2020-03-03 DIAGNOSIS — R0981 Nasal congestion: Secondary | ICD-10-CM

## 2020-03-03 DIAGNOSIS — R05 Cough: Secondary | ICD-10-CM | POA: Diagnosis not present

## 2020-03-03 DIAGNOSIS — R059 Cough, unspecified: Secondary | ICD-10-CM

## 2020-03-03 DIAGNOSIS — R062 Wheezing: Secondary | ICD-10-CM

## 2020-03-03 MED ORDER — ALBUTEROL SULFATE HFA 108 (90 BASE) MCG/ACT IN AERS: 2.0000 | INHALATION_SPRAY | Freq: Four times a day (QID) | RESPIRATORY_TRACT | 0 refills | Status: DC | PRN

## 2020-03-03 MED ORDER — BENZONATATE 100 MG PO CAPS: 100.0000 mg | ORAL_CAPSULE | Freq: Three times a day (TID) | ORAL | 0 refills | Status: DC | PRN

## 2020-03-03 MED FILL — ALBUTEROL SULFATE HFA 108 (: 108 (90 BAS | 25 days supply | Qty: 18 | Fill #0

## 2020-03-03 MED FILL — BENZONATATE 100 MG CAPS: 100 | 6 days supply | Qty: 20 | Fill #0

## 2020-03-03 NOTE — Progress Notes (Signed)
Virtual Visit via Video Note  I connected with 1961-05-12  on 03/03/20 at  3:00 PM EDT by a video enabled telemedicine application and verified that I am speaking with the correct person using two identifiers.  Location patient: home,  Location provider:work or home office Persons participating in the virtual visit: patient, provider  I discussed the limitations of evaluation and management by telemedicine and the availability of in person appointments. The patient expressed understanding and agreed to proceed.   HPI:  Acute visit for a cough, wheezing, congestion: -started 2 days ago -symptoms include mild sob and wheezing occ at night, cough, nasal congestion, pnd, a little tired, mild HA, mild pressure in the ear, sneezing, -denies fevers, diarrhea, vomiting, loss of taste or smell, GERD, acid reflux, heart burn, CP -fully vaccinated for COVID19 with pfizer in March and April -denies any known sick contacts, but went to a wedding last weekend - no COVID19 precautions used -reports blood sugars ave been running right around 100 in the mornings -has menierre's and sees Dr. Redmond Baseman for this -PMH also of seasonal allergies, CAD, DM, HTN  ROS: See pertinent positives and negatives per HPI.  Past Medical History:  Diagnosis Date  . Acute myocardial infarction ([place]) 06/22/2018  . Allergy   . Charcot's joint of left foot, non-diabetic   . Coronary artery disease involving native coronary artery with unstable angina pectoris ([place]) 06/22/2018  . Diabetes ([employer])    type 2  . GERD (gastroesophageal reflux disease)   . History of kidney stones    passed  . Hyperlipidemia   . Hypertension   . Neuropathy of left foot   . Pneumonia    As a child  . Right foot ulcer ([place])   . 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 Type II diabetes mellitus with complication, uncontrolled ([place])   . Uncontrolled type 2 diabetes mellitus ([place])      Past Surgical History:  Procedure Laterality Date  . AMPUTATION Right 01/06/2020   Procedure: RIGHT FOOT 2ND RAY AMPUTATION;  Surgeon: Newt Minion, MD;  Location: [facility];  Service: Orthopedics;  Laterality: Right;  . COLONOSCOPY W/ POLYPECTOMY    . CORONARY ARTERY BYPASS GRAFT N/A 06/25/2018   Procedure: CORONARY ARTERY BYPASS GRAFTING (CABG) TIMES FOUR USING LEFT INTERNAL MAMMARY ARTERY AND RIGHT ENDOSCOPICALLY HARVESTED SAPHENOUS VEIN;  Surgeon: Rexene Alberts, MD;  Location: [facility];  Service: Open Heart Surgery;  Laterality: N/A;  . ENDOVEIN HARVEST OF GREATER SAPHENOUS VEIN Right 06/25/2018   Procedure: ENDOVEIN HARVEST OF GREATER SAPHENOUS VEIN;  Surgeon: Rexene Alberts, MD;  Location: [facility];  Service: Open Heart Surgery;  Laterality: Right;  . LEFT HEART CATH AND CORONARY ANGIOGRAPHY N/A 06/22/2018   Procedure: LEFT HEART CATH AND CORONARY ANGIOGRAPHY;  Surgeon: Nigel Mormon, MD;  Location: [facility];  Service: Cardiovascular;  Laterality: N/A;  . surgical debridement  Right    Right foot ulcer  . TEE WITHOUT CARDIOVERSION N/A 06/25/2018   Procedure: TRANSESOPHAGEAL ECHOCARDIOGRAM (TEE);  Surgeon: Rexene Alberts, MD;  Location: [facility];  Service: Open Heart Surgery;  Laterality: N/A;    Family History  Problem Relation Age of Onset  . Diabetes Mother   . Stroke Father   . Hypertension Father   . Hearing loss Father   . Colon polyps Father   . Colon cancer Neg Hx   .  Esophageal cancer Neg Hx   . Prostate cancer Neg Hx     SOCIAL HX: see HPI   Current Outpatient Medications:  .  aspirin EC 81 MG tablet, Take 81 mg by mouth daily., Disp: , Rfl:  .  atorvastatin (LIPITOR) 80 MG tablet, Take 1 tablet (80 mg total) by mouth daily at 6 PM., Disp: 90 tablet, Rfl: 3 .  azelastine (ASTELIN) 0.1 % nasal spray, Place 2 sprays into both nostrils 2 (two) times daily. Use in each nostril as directed, Disp: 30 mL, Rfl: 1 .  Blood Glucose Monitoring Suppl  (FREESTYLE LITE) DEVI, , Disp: , Rfl:  .  canagliflozin (INVOKANA) 100 MG TABS tablet, TAKE 1 TABLET (100 MG TOTAL) BY MOUTH DAILY BEFORE BREAKFAST. (Patient taking differently: Take 100 mg by mouth daily before breakfast. ), Disp: 90 tablet, Rfl: 3 .  cetirizine (ZYRTEC) 10 MG tablet, Take 1 tablet (10 mg total) by mouth daily., Disp: 30 tablet, Rfl: 11 .  clopidogrel (PLAVIX) 75 MG tablet, Take 1 tablet (75 mg total) by mouth daily., Disp: 90 tablet, Rfl: 3 .  Coenzyme Q10 (CO Q-10) 300 MG CAPS, Take 300 mg by mouth daily., Disp: , Rfl:  .  doxycycline (VIBRA-TABS) 100 MG tablet, Take 1 tablet (100 mg total) by mouth 2 (two) times daily., Disp: 60 tablet, Rfl: 0 .  glucose blood (FREESTYLE LITE) test strip, USE AS INSTRUCTED TO CHECK 1-2X A DAY, Disp: 100 strip, Rfl: 1 .  Lancets MISC, Check sugars 3 times a day E11.9, Disp: 100 each, Rfl: 0 .  lisinopril (ZESTRIL) 10 MG tablet, TAKE 1 TABLET (10 MG TOTAL) BY MOUTH DAILY., Disp: 90 tablet, Rfl: 3 .  metFORMIN (GLUCOPHAGE) 500 MG tablet, Take 1 tablet (500 mg total) by mouth 2 (two) times daily with a meal., Disp: 360 tablet, Rfl: 1 .  metoprolol tartrate (LOPRESSOR) 25 MG tablet, Take 1 tablet (25 mg total) by mouth 2 (two) times daily., Disp: 180 tablet, Rfl: 3 .  Multiple Vitamin (MULTIVITAMIN WITH MINERALS) TABS tablet, Take 1 tablet by mouth daily., Disp: , Rfl:  .  sildenafil (REVATIO) 20 MG tablet, TAKE 2-5 TABLETS BY MOUTH ONE HOUR BEFORE SEX AS NEEDED **MAX OF 5 TABLETS WITHIN A 24 HOUR PERIOD**, Disp: 100 tablet, Rfl: 0 .  sildenafil (VIAGRA) 50 MG tablet, Take 1 tablet (50 mg total) by mouth daily as needed for erectile dysfunction., Disp: 10 tablet, Rfl: 0 .  sulfamethoxazole-trimethoprim (BACTRIM DS) 800-160 MG tablet, Take 1 tablet by mouth 2 (two) times daily., Disp: 40 tablet, Rfl: 0 .  traMADol (ULTRAM) 50 MG tablet, Take 1 tablet (50 mg total) by mouth every 6 (six) hours as needed., Disp: 30 tablet, Rfl: 0 .  vitamin B-12  (CYANOCOBALAMIN) 1000 MCG tablet, Take 1,000 mcg by mouth daily., Disp: , Rfl:   EXAM:  VITALS per patient if applicable: denies fever  GENERAL: alert, oriented, appears well and in no acute distress  HEENT: atraumatic, conjunttiva clear, no obvious abnormalities on inspection of external nose and ears  NECK: normal movements of the head and neck  LUNGS: on inspection no signs of respiratory distress, breathing rate appears normal, no obvious gross SOB, gasping or wheezing  CV: no obvious cyanosis  MS: moves all visible extremities without noticeable abnormality  PSYCH/NEURO: pleasant and cooperative, no obvious depression or anxiety, speech and thought processing grossly intact  ASSESSMENT AND PLAN:  Discussed the following assessment and plan:  Wheeze  Cough  Nasal congestion  Nonintractable  headache, unspecified chronicity pattern, unspecified headache type  -we discussed possible serious and likely etiologies, options for evaluation and workup, limitations of telemedicine visit vs in person visit, treatment, treatment risks and precautions. Pt prefers to treat via telemedicine empirically rather then risking or undertaking an in person visit at this moment. Query VURI with possible bronchial involvement most likely vs other. Did discuss we are seeing an uptick in [place] cases even in fully vaccinated patients this last week and did advise COVID19 testing given his PMH and symptoms. He agrees to go to a location near him. Provided number for outpatient treatment if positive and also advise home isolation. In interim alb for any wheezing, tessalon for cough, symptomatic otc care o/w and prompt follow up or in person care if worsening, new symptoms arise, or if is not improving with treatment.   I discussed the assessment and treatment plan with the patient. The patient was provided an opportunity to ask questions and all were answered. The patient agreed with the plan and  demonstrated an understanding of the instructions.   The patient was advised to call back or seek an in-person evaluation if the symptoms worsen or if the condition fails to improve as anticipated.   Lucretia Kern, DO

## 2020-03-03 NOTE — Telephone Encounter (Signed)
Unfortunately sounds like best to be seen today. Even tomorrow my schedule is full. Can he be scheduled at other office virtual visit willing to see him? Otherwise recommend Urgent care. Can't make guess on treatment. Respiratory issues typically not one of his chronic complaints. So virtual visit, UC or ED.  Also out next week.

## 2020-03-03 NOTE — Telephone Encounter (Signed)
Spoke with patient, offered him appointment with Colin Benton at [facility] today at 3:00. Patient has been scheduled.

## 2020-03-03 NOTE — Telephone Encounter (Signed)
Triage line called after speaking with patient requesting him to be seen within 4 hours. I spoke with patient he states he has been wheezing for about 2 days. Worse at night.  Mild cough and headache. No fever. Patient has had both pfizer covid-19 vaccines. No trouble breathing just wheezing. Tried to schedule him an appointment for a VV with any provider here today. Everyone was full. Please advise what to do.

## 2020-03-03 NOTE — Patient Instructions (Signed)
-  I sent the medication(s) we discussed to your pharmacy: Meds ordered this encounter  Medications  . albuterol (VENTOLIN HFA) 108 (90 Base) MCG/ACT inhaler    Sig: Inhale 2 puffs into the lungs every 6 (six) hours as needed for wheezing or shortness of breath.    Dispense:  8.5 g    Refill:  0  . benzonatate (TESSALON PERLES) 100 MG capsule    Sig: Take 1 capsule (100 mg total) by mouth 3 (three) times daily as needed.    Dispense:  20 capsule    Refill:  0    Please let us know if you have any questions or concerns regarding this prescription.  Get a COVID19 test   Can try nasal saline for congestion twice daily.  Stay hydrated and get plenty of rest.  Let your cardiologist know about any difficulty breathing or any heart concerns.  I hope you are feeling better soon! Seek care promptly if your symptoms worsen, new concerns arise or you are not improving with treatment.

## 2020-03-07 ENCOUNTER — Ambulatory Visit: Payer: 59 | Admitting: Orthopedic Surgery

## 2020-03-08 ENCOUNTER — Encounter: Payer: Self-pay | Admitting: Orthopedic Surgery

## 2020-03-08 ENCOUNTER — Ambulatory Visit (INDEPENDENT_AMBULATORY_CARE_PROVIDER_SITE_OTHER): Payer: 59 | Admitting: Orthopedic Surgery

## 2020-03-08 VITALS — Ht 75.0 in | Wt 239.0 lb

## 2020-03-08 DIAGNOSIS — Z89421 Acquired absence of other right toe(s): Secondary | ICD-10-CM

## 2020-03-08 DIAGNOSIS — Z7984 Long term (current) use of oral hypoglycemic drugs: Secondary | ICD-10-CM | POA: Diagnosis not present

## 2020-03-08 DIAGNOSIS — H52203 Unspecified astigmatism, bilateral: Secondary | ICD-10-CM | POA: Diagnosis not present

## 2020-03-08 DIAGNOSIS — H5213 Myopia, bilateral: Secondary | ICD-10-CM | POA: Diagnosis not present

## 2020-03-08 DIAGNOSIS — H524 Presbyopia: Secondary | ICD-10-CM | POA: Diagnosis not present

## 2020-03-08 DIAGNOSIS — E119 Type 2 diabetes mellitus without complications: Secondary | ICD-10-CM | POA: Diagnosis not present

## 2020-03-08 DIAGNOSIS — M47812 Spondylosis without myelopathy or radiculopathy, cervical region: Secondary | ICD-10-CM

## 2020-03-08 DIAGNOSIS — H35033 Hypertensive retinopathy, bilateral: Secondary | ICD-10-CM | POA: Diagnosis not present

## 2020-03-08 NOTE — Progress Notes (Signed)
Office Visit Note   [name]           Date of Birth: 07-13-1961           MRN: [id number] Visit Date: 03/08/2020              Requested by: Mackie Pai, PA-C [place],  [place] [id number] PCP: Mackie Pai, PA-C  Chief Complaint  Patient presents with  . Right Foot - Routine Post Op    01/06/20 right foot 2nd ray amputation   . Neck - Follow-up    MRI C spine review       HPI: Patient is a 59 year old gentleman who presents for 2 separate issues #1 he is status post right foot second ray amputation he states he feels well has no problems.  #2 he has chronic neck pain with numbness tingling and weakness in both hands status post MRI scan.  Assessment & Plan: Visit Diagnoses:  1. 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 Ernestina Patches to evaluate his cervical spine.  Patient may benefit from epidural steroid injections may need nerve conduction studies for a possible double crush injury.  Patient will follow-up as needed for his foot.  Follow-Up Instructions: Return if symptoms worsen or fail to improve.   Ortho Exam  Patient is alert, oriented, no adenopathy, well-dressed, normal affect, normal respiratory effort. Examination patient symptoms are essentially unchanged review of the MRI scan shows multilevel spinal stenosis but no specific herniated disc.  He still has subjective numbness in all fingers and has subjective weakness in both hands.  The ulcer on his foot is completely healed.  He has good dorsiflexion the importance of continuing dorsiflexion strengthening was discussed he would best to regular shoewear he has a good dorsalis pedis pulse.  Imaging: No results found. No images are attached to the encounter.  Labs: Lab Results  Component Value Date   HGBA1C 7.8 (H) 05/08/2019   HGBA1C 8.2 (H) 06/23/2018   HGBA1C 11.7 (H) 01/17/2018     Lab Results   Component Value Date   ALBUMIN 3.9 05/08/2019   ALBUMIN 2.7 (L) 06/23/2018   ALBUMIN 4.4 01/17/2018    Lab Results  Component Value Date   MG 2.1 05/08/2019   MG 2.2 06/26/2018   MG 3.0 (H) 06/26/2018   No results found for: VD25OH  No results found for: PREALBUMIN CBC EXTENDED Latest Ref Rng & Units 01/06/2020 12/24/2019 06/28/2018  WBC 4.0 - 10.5 K/uL 8.9 9.3 12.9(H)  RBC 4.22 - 5.81 MIL/uL 4.21(L) 4.53 3.85(L)  HGB 13.0 - 17.0 g/dL 12.6(L) 13.8 11.4(L)  HCT 39 - 52 % 39.0 40.0 34.4(L)  PLT 150 - 400 K/uL 411(H) 313 279  NEUTROABS 1,500 - 7,800 cells/uL - 6,901 -  LYMPHSABS 850 - 3,900 cells/uL - 1,125 -     Body mass index is 29.87 kg/m.  Orders:  No orders of the defined types were placed in this encounter.  No orders of the defined types were placed in this encounter.    Procedures: No procedures performed  Clinical Data: No additional findings.  ROS:  All other systems negative, except as noted in the HPI. Review of Systems  Objective: Vital Signs: Ht 6\' 3"  (1.905 m)   Wt 239 lb (108.4 kg)   BMI 29.87 kg/m   Specialty Comments:  No specialty comments available.  PMFS History: Patient Active Problem List   Diagnosis Date Noted  . Osteomyelitis of second toe of right foot ([place])   . Poorly controlled type 2 diabetes mellitus with circulatory disorder ([place]) 07/15/2018  . Numbness in both hands 07/15/2018  . 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 Acute coronary syndrome ([place]) 06/22/2018  . Nonhealing skin ulcer ([place]) 06/22/2018  . Coronary artery disease involving native coronary artery with unstable angina pectoris ([place]) 06/22/2018  . Acute myocardial infarction ([place]) 06/22/2018  . Hyperlipidemia   . 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 Vertigo 08/20/2016  . Myringotomy tube status 08/20/2016  . Ear fullness, left 08/20/2016  . Asymmetrical left sensorineural hearing loss 02/06/2016  . Neuropathy of left foot 02/03/2015  . Charcot's  joint of left foot, non-diabetic 02/03/2015  . 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 Cramping of hands 06/02/2014  . Eustachian tube dysfunction 06/02/2014  . HTN (hypertension) 05/07/2014  . Obstructive sleep apnea 05/07/2014   Past Medical History:  Diagnosis Date  . Acute myocardial infarction ([place]) 06/22/2018  . Allergy   . Charcot's joint of left foot, non-diabetic   . Coronary artery disease involving native coronary artery with unstable angina pectoris ([place]) 06/22/2018  . Diabetes ([place])    type 2  . GERD (gastroesophageal reflux disease)   . History of kidney stones    passed  . Hyperlipidemia   . Hypertension   . Neuropathy of left foot   . Pneumonia    As a child  . Right foot ulcer ([place])   . 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 Type II diabetes mellitus with complication, uncontrolled ([place])   . Uncontrolled type 2 diabetes mellitus ([name]     Family History  Problem Relation Age of Onset  . Diabetes Mother   . Stroke Father   . Hypertension Father   . Hearing loss Father   . Colon polyps Father   . Colon cancer Neg Hx   . Esophageal cancer Neg Hx   . Prostate cancer Neg Hx     Past Surgical History:  Procedure Laterality Date  . AMPUTATION Right 01/06/2020   Procedure: RIGHT FOOT 2ND RAY AMPUTATION;  Surgeon: Newt Minion, MD;  Location: [facility];  Service: Orthopedics;  Laterality: Right;  . COLONOSCOPY W/ POLYPECTOMY    . CORONARY ARTERY BYPASS GRAFT N/A 06/25/2018   Procedure: CORONARY ARTERY BYPASS GRAFTING (CABG) TIMES FOUR USING LEFT INTERNAL MAMMARY ARTERY AND RIGHT ENDOSCOPICALLY HARVESTED SAPHENOUS VEIN;  Surgeon: Rexene Alberts, MD;  Location: [facility];  Service: Open Heart Surgery;  Laterality: N/A;  . ENDOVEIN HARVEST OF GREATER SAPHENOUS VEIN Right 06/25/2018   Procedure: ENDOVEIN HARVEST OF GREATER SAPHENOUS VEIN;  Surgeon: Rexene Alberts, MD;  Location: [facility];  Service: Open Heart  Surgery;  Laterality: Right;  . LEFT HEART CATH AND CORONARY ANGIOGRAPHY N/A 06/22/2018   Procedure: LEFT HEART CATH AND CORONARY ANGIOGRAPHY;  Surgeon: Nigel Mormon, MD;  Location: [facility];  Service: Cardiovascular;  Laterality: N/A;  . surgical debridement  Right    Right foot ulcer  . TEE WITHOUT CARDIOVERSION N/A 06/25/2018   Procedure: TRANSESOPHAGEAL ECHOCARDIOGRAM (TEE);  Surgeon: Rexene Alberts, MD;  Location: [facility];  Service: Open Heart Surgery;  Laterality: N/A;   Social History   Occupational History  . Occupation: attorney  Tobacco  Use  . Smoking status: Never Smoker  . Smokeless tobacco: Never Used  Vaping Use  . Vaping Use: Never used  Substance and Sexual Activity  . Alcohol use: Yes    Alcohol/week: 2.0 standard drinks    Types: 2 Cans of beer per week    Comment: 2 cans of beer or 2 borbon  . Drug use: Never  . Sexual activity: Not on file

## 2020-03-14 ENCOUNTER — Ambulatory Visit: Payer: 59 | Admitting: Medical

## 2020-03-29 ENCOUNTER — Other Ambulatory Visit: Payer: Self-pay

## 2020-03-29 ENCOUNTER — Ambulatory Visit (INDEPENDENT_AMBULATORY_CARE_PROVIDER_SITE_OTHER): Payer: 59 | Admitting: Medical

## 2020-03-29 VITALS — BP 131/78 | HR 78 | Temp 97.7°F | Resp 16 | Ht 75.0 in | Wt 231.6 lb

## 2020-03-29 DIAGNOSIS — Z23 Encounter for immunization: Secondary | ICD-10-CM | POA: Diagnosis not present

## 2020-03-29 DIAGNOSIS — Z0000 Encounter for general adult medical examination without abnormal findings: Secondary | ICD-10-CM | POA: Diagnosis not present

## 2020-03-29 DIAGNOSIS — M4802 Spinal stenosis, cervical region: Secondary | ICD-10-CM | POA: Diagnosis not present

## 2020-03-29 DIAGNOSIS — Z125 Encounter for screening for malignant neoplasm of prostate: Secondary | ICD-10-CM | POA: Diagnosis not present

## 2020-03-29 NOTE — Patient Instructions (Addendum)
For you wellness exam today I have ordered cbc, cmp,psa  and lipid panel. Fasting within next week.  Vaccine given today pneumonia. Pneumonia 23 vaccine.  Recommend exercise and healthy diet.  We will let you know lab results as they come in.  Follow up date appointment will be determined after lab review.   For hand tingling and weakness referred you to Dr. Ellene Route  Diabetic foot exam done today.   Preventive Care 57-57 Years Old, Male Preventive care refers to lifestyle choices and visits with your health care provider that can promote health and wellness. This includes:  A yearly physical exam. This is also called an annual well check.  Regular dental and eye exams.  Immunizations.  Screening for certain conditions.  Healthy lifestyle choices, such as eating a healthy diet, getting regular exercise, not using drugs or products that contain nicotine and tobacco, and limiting alcohol use. What can I expect for my preventive care visit? Physical exam Your health care provider will check:  Height and weight. These may be used to calculate body mass index (BMI), which is a measurement that tells if you are at a healthy weight.  Heart rate and blood pressure.  Your skin for abnormal spots. Counseling Your health care provider may ask you questions about:  Alcohol, tobacco, and drug use.  Emotional well-being.  Home and relationship well-being.  Sexual activity.  Eating habits.  Work and work Statistician. What immunizations do I need?  Influenza (flu) vaccine  This is recommended every year. Tetanus, diphtheria, and pertussis (Tdap) vaccine  You may need a Td booster every 10 years. Varicella (chickenpox) vaccine  You may need this vaccine if you have not already been vaccinated. Zoster (shingles) vaccine  You may need this after age 83. Measles, mumps, and rubella (MMR) vaccine  You may need at least one dose of MMR if you were born in 1957 or later. You  may also need a second dose. Pneumococcal conjugate (PCV13) vaccine  You may need this if you have certain conditions and were not previously vaccinated. Pneumococcal polysaccharide (PPSV23) vaccine  You may need one or two doses if you smoke cigarettes or if you have certain conditions. Meningococcal conjugate (MenACWY) vaccine  You may need this if you have certain conditions. Hepatitis A vaccine  You may need this if you have certain conditions or if you travel or work in places where you may be exposed to hepatitis A. Hepatitis B vaccine  You may need this if you have certain conditions or if you travel or work in places where you may be exposed to hepatitis B. Haemophilus influenzae type b (Hib) vaccine  You may need this if you have certain risk factors. Human papillomavirus (HPV) vaccine  If recommended by your health care provider, you may need three doses over 6 months. You may receive vaccines as individual doses or as more than one vaccine together in one shot (combination vaccines). Talk with your health care provider about the risks and benefits of combination vaccines. What tests do I need? Blood tests  Lipid and cholesterol levels. These may be checked every 5 years, or more frequently if you are over 41 years old.  Hepatitis C test.  Hepatitis B test. Screening  Lung cancer screening. You may have this screening every year starting at age 1 if you have a 30-pack-year history of smoking and currently smoke or have quit within the past 15 years.  Prostate cancer screening. Recommendations will vary depending on  your family history and other risks.  Colorectal cancer screening. All adults should have this screening starting at age 90 and continuing until age 86. Your health care provider may recommend screening at age 25 if you are at increased risk. You will have tests every 1-10 years, depending on your results and the type of screening test.  Diabetes  screening. This is done by checking your blood sugar (glucose) after you have not eaten for a while (fasting). You may have this done every 1-3 years.  Sexually transmitted disease (STD) testing. Follow these instructions at home: Eating and drinking  Eat a diet that includes fresh fruits and vegetables, whole grains, lean protein, and low-fat dairy products.  Take vitamin and mineral supplements as recommended by your health care provider.  Do not drink alcohol if your health care provider tells you not to drink.  If you drink alcohol: ? Limit how much you have to 0-2 drinks a day. ? Be aware of how much alcohol is in your drink. In the [place] one drink equals one 12 oz bottle of beer (355 mL), one 5 oz glass of wine (148 mL), or one 1 oz glass of hard liquor (44 mL). Lifestyle  Take daily care of your teeth and gums.  Stay active. Exercise for at least 30 minutes on 5 or more days each week.  Do not use any products that contain nicotine or tobacco, such as cigarettes, e-cigarettes, and chewing tobacco. If you need help quitting, ask your health care provider.  If you are sexually active, practice safe sex. Use a condom or other form of protection to prevent STIs (sexually transmitted infections).  Talk with your health care provider about taking a low-dose aspirin every day starting at age 12. What's next?  Go to your health care provider once a year for a well check visit.  Ask your health care provider how often you should have your eyes and teeth checked.  Stay up to date on all vaccines. This information is not intended to replace advice given to you by your health care provider. Make sure you discuss any questions you have with your health care provider. Document Revised: 07/10/2018 Document Reviewed: 07/10/2018 Elsevier Patient Education  2020 [employer].

## 2020-03-29 NOTE — Progress Notes (Signed)
Subjective:    Patient ID: Joshua Branch, male    DOB: 05/11/1961, 59 y.o.   MRN: [id number]  HPI Pt in for follow up.   Pt states he is in since he was told he needed annual cpe.  Pt has gotten vaccine. Went thru [facility]. 2nd in April.   Pt has loss of feeling in fingers and weakness. Pt has had mri done and wants referral . He has been debating ortho vs neurosurgeon.   IMPRESSION: 1. Multilevel cervical spondylosis with resultant mild spinal stenosis at C3-4 through C5-6. 2. Multifactorial degenerative changes with resultant multilevel foraminal narrowing as above. Notable findings include moderate right C4 foraminal stenosis, mild bilateral C5 foraminal narrowing, with moderate bilateral C6 foraminal stenosis. 3. Reactive marrow edema about the right C4-5 and left C5-6 facets due to facet arthritis. Finding could serve as a source for neck pain.  Pt has diabetes, hyperlipidemia and htn.  Diabetic eye exam 2 weeks.     Review of Systems  Constitutional: Negative for chills, fatigue and fever.  Respiratory: Negative for cough, chest tightness, shortness of breath and wheezing.   Cardiovascular: Negative for chest pain and palpitations.  Gastrointestinal: Negative for abdominal pain.  Genitourinary: Negative for dysuria and urgency.  Musculoskeletal: Negative for back pain.  Skin: Negative for rash.  Neurological: Negative for dizziness and headaches.       See hpi.   Hematological: Negative for adenopathy. Does not bruise/bleed easily.  Psychiatric/Behavioral: Negative for behavioral problems, confusion and dysphoric mood. The patient is not nervous/anxious.     Past Medical History:  Diagnosis Date  . Acute myocardial infarction ([facility]) 06/22/2018  . Allergy   . Charcot's joint of left foot, non-diabetic   . Coronary artery disease involving native coronary artery with unstable angina pectoris ([place]) 06/22/2018  . Diabetes ([place])    type 2  . GERD  (gastroesophageal reflux disease)   . History of kidney stones    passed  . Hyperlipidemia   . Hypertension   . Neuropathy of left foot   . Pneumonia    As a child  . Right foot ulcer ([place])   . 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 Type II diabetes mellitus with complication, uncontrolled ([place])   . Uncontrolled type 2 diabetes mellitus ([place])      Social History   Socioeconomic History  . Marital status: Married    Spouse name: [name]  . Number of children: 2  . Years of education: Not on file  . Highest education level: Professional school degree (e.g., MD, DDS, DVM, JD)  Occupational History  . Occupation: attorney  Tobacco Use  . Smoking status: Never Smoker  . Smokeless tobacco: Never Used  Vaping Use  . Vaping Use: Never used  Substance and Sexual Activity  . Alcohol use: Yes    Alcohol/week: 2.0 standard drinks    Types: 2 Cans of beer per week    Comment: 2 cans of beer or 2 borbon  . Drug use: Never  . Sexual activity: Not on file  Other Topics Concern  . Not on file  Social History Narrative  . Not on file   Social Determinants of Health   Financial Resource Strain:   . Difficulty of Paying Living Expenses: Not on file  Food Insecurity:   . Worried About Charity fundraiser in the  Last Year: Not on file  . Ran Out of Food in the Last Year: Not on file  Transportation Needs:   . Lack of Transportation (Medical): Not on file  . Lack of Transportation (Non-Medical): Not on file  Physical Activity:   . Days of Exercise per Week: Not on file  . Minutes of Exercise per Session: Not on file  Stress:   . Feeling of Stress : Not on file  Social Connections:   . Frequency of Communication with Friends and Family: Not on file  . Frequency of Social Gatherings with Friends and Family: Not on file  . Attends Religious Services: Not on file  . Active Member of Clubs or Organizations: Not on file   . Attends Archivist Meetings: Not on file  . Marital Status: Not on file  Intimate Partner Violence:   . Fear of Current or Ex-Partner: Not on file  . Emotionally Abused: Not on file  . Physically Abused: Not on file  . Sexually Abused: Not on file    Past Surgical History:  Procedure Laterality Date  . AMPUTATION Right 01/06/2020   Procedure: RIGHT FOOT 2ND RAY AMPUTATION;  Surgeon: Newt Minion, MD;  Location: [facility];  Service: Orthopedics;  Laterality: Right;  . COLONOSCOPY W/ POLYPECTOMY    . CORONARY ARTERY BYPASS GRAFT N/A 06/25/2018   Procedure: CORONARY ARTERY BYPASS GRAFTING (CABG) TIMES FOUR USING LEFT INTERNAL MAMMARY ARTERY AND RIGHT ENDOSCOPICALLY HARVESTED SAPHENOUS VEIN;  Surgeon: Rexene Alberts, MD;  Location: [facility];  Service: Open Heart Surgery;  Laterality: N/A;  . ENDOVEIN HARVEST OF GREATER SAPHENOUS VEIN Right 06/25/2018   Procedure: ENDOVEIN HARVEST OF GREATER SAPHENOUS VEIN;  Surgeon: Rexene Alberts, MD;  Location: [facility];  Service: Open Heart Surgery;  Laterality: Right;  . LEFT HEART CATH AND CORONARY ANGIOGRAPHY N/A 06/22/2018   Procedure: LEFT HEART CATH AND CORONARY ANGIOGRAPHY;  Surgeon: Nigel Mormon, MD;  Location: [facility];  Service: Cardiovascular;  Laterality: N/A;  . surgical debridement  Right    Right foot ulcer  . TEE WITHOUT CARDIOVERSION N/A 06/25/2018   Procedure: TRANSESOPHAGEAL ECHOCARDIOGRAM (TEE);  Surgeon: Rexene Alberts, MD;  Location: [facility];  Service: Open Heart Surgery;  Laterality: N/A;    Family History  Problem Relation Age of Onset  . Diabetes Mother   . Stroke Father   . Hypertension Father   . Hearing loss Father   . Colon polyps Father   . Colon cancer Neg Hx   . Esophageal cancer Neg Hx   . Prostate cancer Neg Hx     Allergies  Allergen Reactions  . Apricot Kernel Oil [Prunus] Swelling    THROAT  . Cherry Swelling    THROAT  . Jardiance [Empagliflozin] Diarrhea and Nausea And  Vomiting  . Other Other (See Comments)    Fruits with a pit. Throat swells up. Can have them if cooked   . Peach [Prunus Persica] Swelling    THROAT  . Plum Pulp Swelling    THROAT    Current Outpatient Medications on File Prior to Visit  Medication Sig Dispense Refill  . albuterol (VENTOLIN HFA) 108 (90 Base) MCG/ACT inhaler Inhale 2 puffs into the lungs every 6 (six) hours as needed for wheezing or shortness of breath. 8.5 g 0  . aspirin EC 81 MG tablet Take 81 mg by mouth daily.    Marland Kitchen atorvastatin (LIPITOR) 80 MG tablet Take 1 tablet (80 mg total) by  mouth daily at 6 PM. 90 tablet 3  . azelastine (ASTELIN) 0.1 % nasal spray Place 2 sprays into both nostrils 2 (two) times daily. Use in each nostril as directed 30 mL 1  . benzonatate (TESSALON PERLES) 100 MG capsule Take 1 capsule (100 mg total) by mouth 3 (three) times daily as needed. 20 capsule 0  . Blood Glucose Monitoring Suppl (FREESTYLE LITE) DEVI     . canagliflozin (INVOKANA) 100 MG TABS tablet TAKE 1 TABLET (100 MG TOTAL) BY MOUTH DAILY BEFORE BREAKFAST. (Patient taking differently: Take 100 mg by mouth daily before breakfast. ) 90 tablet 3  . cetirizine (ZYRTEC) 10 MG tablet Take 1 tablet (10 mg total) by mouth daily. 30 tablet 11  . clopidogrel (PLAVIX) 75 MG tablet Take 1 tablet (75 mg total) by mouth daily. 90 tablet 3  . Coenzyme Q10 (CO Q-10) 300 MG CAPS Take 300 mg by mouth daily.    Marland Kitchen doxycycline (VIBRA-TABS) 100 MG tablet Take 1 tablet (100 mg total) by mouth 2 (two) times daily. 60 tablet 0  . glucose blood (FREESTYLE LITE) test strip USE AS INSTRUCTED TO CHECK 1-2X A DAY 100 strip 1  . Lancets MISC Check sugars 3 times a day E11.9 100 each 0  . lisinopril (ZESTRIL) 10 MG tablet TAKE 1 TABLET (10 MG TOTAL) BY MOUTH DAILY. 90 tablet 3  . metFORMIN (GLUCOPHAGE) 500 MG tablet Take 1 tablet (500 mg total) by mouth 2 (two) times daily with a meal. 360 tablet 1  . metoprolol tartrate (LOPRESSOR) 25 MG tablet Take 1 tablet (25  mg total) by mouth 2 (two) times daily. 180 tablet 3  . Multiple Vitamin (MULTIVITAMIN WITH MINERALS) TABS tablet Take 1 tablet by mouth daily.    . sildenafil (REVATIO) 20 MG tablet TAKE 2-5 TABLETS BY MOUTH ONE HOUR BEFORE SEX AS NEEDED **MAX OF 5 TABLETS WITHIN A 24 HOUR PERIOD** 100 tablet 0  . sildenafil (VIAGRA) 50 MG tablet Take 1 tablet (50 mg total) by mouth daily as needed for erectile dysfunction. 10 tablet 0  . sulfamethoxazole-trimethoprim (BACTRIM DS) 800-160 MG tablet Take 1 tablet by mouth 2 (two) times daily. 40 tablet 0  . traMADol (ULTRAM) 50 MG tablet Take 1 tablet (50 mg total) by mouth every 6 (six) hours as needed. 30 tablet 0  . vitamin B-12 (CYANOCOBALAMIN) 1000 MCG tablet Take 1,000 mcg by mouth daily.     No current facility-administered medications on file prior to visit.    BP 131/78   Pulse 78   Temp 97.7 F (36.5 C) (Oral)   Resp 16   Ht 6\' 3"  (1.905 m)   Wt 231 lb 9.6 oz (105.1 kg)   SpO2 99%   BMI 28.95 kg/m       Objective:   Physical Exam  General Mental Status- Alert. General Appearance- Not in acute distress.   Skin General: Color- Normal Color. Moisture- Normal Moisture. Followed by dermatologist. Clarnce Flock last week.   Neck Carotid Arteries- Normal color. Moisture- Normal Moisture. No carotid bruits. No JVD.  Chest and Lung Exam Auscultation: Breath Sounds:-Normal.  Cardiovascular Auscultation:Rythm- Regular. Murmurs & Other Heart Sounds:Auscultation of the heart reveals- No Murmurs.  Abdomen Inspection:-Inspeection Normal. Palpation/Percussion:Note:No mass. Palpation and Percussion of the abdomen reveal- Non Tender, Non Distended + BS, no rebound or guarding.   Neurologic Cranial Nerve exam:- CN III-XII intact(No nystagmus), symmetric smile. Strength:- 5/5 equal and symmetric strength both upper and lower extremities.  Assessment & Plan:  For you wellness exam today I have ordered cbc, cmp,psa  and lipidpanel. Fasting  within next week.  Vaccine given today pneumonia.   Recommend exercise and healthy diet.  We will let you know lab results as they come in.  Follow up date appointment will be determined after lab review.   For hand tingling and weakness referred you to Dr. Ellene Route  Diabetic foot exam done today.  Mackie Pai, [place]    [place] charge today. Additional 10 + minutes handling and discussing referral to neurosurgeon and did diabetic foot exam

## 2020-04-11 DIAGNOSIS — H8102 Meniere's disease, left ear: Secondary | ICD-10-CM | POA: Diagnosis not present

## 2020-04-11 DIAGNOSIS — H9042 Sensorineural hearing loss, unilateral, left ear, with unrestricted hearing on the contralateral side: Secondary | ICD-10-CM | POA: Diagnosis not present

## 2020-04-15 ENCOUNTER — Other Ambulatory Visit: Payer: Self-pay

## 2020-04-15 ENCOUNTER — Other Ambulatory Visit (INDEPENDENT_AMBULATORY_CARE_PROVIDER_SITE_OTHER): Payer: 59

## 2020-04-15 DIAGNOSIS — M4802 Spinal stenosis, cervical region: Secondary | ICD-10-CM | POA: Diagnosis not present

## 2020-04-15 DIAGNOSIS — Z125 Encounter for screening for malignant neoplasm of prostate: Secondary | ICD-10-CM

## 2020-04-16 LAB — CBC WITH DIFFERENTIAL/PLATELET
Absolute Monocytes: 679 cells/uL (ref 200–950)
Basophils Absolute: 58 cells/uL (ref 0–200)
Basophils Relative: 0.8 %
Eosinophils Absolute: 146 cells/uL (ref 15–500)
Eosinophils Relative: 2 %
HCT: 40.2 % (ref 38.5–50.0)
Hemoglobin: 13.7 g/dL (ref 13.2–17.1)
Lymphs Abs: 2102 cells/uL (ref 850–3900)
MCH: 31 pg (ref 27.0–33.0)
MCHC: 34.1 g/dL (ref 32.0–36.0)
MCV: 91 fL (ref 80.0–100.0)
MPV: 9.7 fL (ref 7.5–12.5)
Monocytes Relative: 9.3 %
Neutro Abs: 4314 cells/uL (ref 1500–7800)
Neutrophils Relative %: 59.1 %
Platelets: 261 10*3/uL (ref 140–400)
RBC: 4.42 10*6/uL (ref 4.20–5.80)
RDW: 11.9 % (ref 11.0–15.0)
Total Lymphocyte: 28.8 %
WBC: 7.3 10*3/uL (ref 3.8–10.8)

## 2020-04-16 LAB — COMPREHENSIVE METABOLIC PANEL
AG Ratio: 1.5 (calc) (ref 1.0–2.5)
ALT: 12 U/L (ref 9–46)
AST: 12 U/L (ref 10–35)
Albumin: 4 g/dL (ref 3.6–5.1)
Alkaline phosphatase (APISO): 90 U/L (ref 35–144)
BUN: 22 mg/dL (ref 7–25)
CO2: 27 mmol/L (ref 20–32)
Calcium: 8.9 mg/dL (ref 8.6–10.3)
Chloride: 107 mmol/L (ref 98–110)
Creat: 1.11 mg/dL (ref 0.70–1.33)
Globulin: 2.6 g/dL (calc) (ref 1.9–3.7)
Glucose, Bld: 133 mg/dL — ABNORMAL HIGH (ref 65–99)
Potassium: 4 mmol/L (ref 3.5–5.3)
Sodium: 144 mmol/L (ref 135–146)
Total Bilirubin: 0.5 mg/dL (ref 0.2–1.2)
Total Protein: 6.6 g/dL (ref 6.1–8.1)

## 2020-04-16 LAB — LIPID PANEL
Cholesterol: 114 mg/dL (ref <200)
HDL: 36 mg/dL — ABNORMAL LOW (ref >=40)
LDL Cholesterol (Calc): 59 mg/dL (calc)
Non-HDL Cholesterol (Calc): 78 mg/dL (calc) (ref <130)
Total CHOL/HDL Ratio: 3.2 (calc) (ref <5.0)
Triglycerides: 103 mg/dL (ref <150)

## 2020-04-16 LAB — PSA: PSA: 0.7 ng/mL (ref <=4.0)

## 2020-04-16 LAB — HEMOGLOBIN A1C
Hgb A1c MFr Bld: 6.9 % of total Hgb — ABNORMAL HIGH (ref <5.7)
Mean Plasma Glucose: 151 (calc)
eAG (mmol/L): 8.4 (calc)

## 2020-04-18 ENCOUNTER — Telehealth: Payer: Self-pay

## 2020-04-18 NOTE — Telephone Encounter (Addendum)
Contact ENT.  Sent in low dose hctz this can help with menier but may drop his blood pressure. So want him to check bp every other day and send me my chart update on bp levels. Want to make sure not dropping excessively.  Providing one month supply and can give refills if helping but want update on bp level.

## 2020-04-19 ENCOUNTER — Telehealth: Payer: Self-pay | Admitting: Medical

## 2020-04-19 MED ORDER — HYDROCHLOROTHIAZIDE 12.5 MG PO CAPS: 12.5000 mg | ORAL_CAPSULE | Freq: Every day | ORAL | 0 refills | Status: DC

## 2020-04-19 NOTE — Telephone Encounter (Signed)
Rx hctz sent to pt pharmacy after reviewing ent note and getting call back from there office as well.

## 2020-04-20 ENCOUNTER — Other Ambulatory Visit: Payer: Self-pay | Admitting: Internal Medicine

## 2020-04-20 MED FILL — HYDROCHLOROTHIAZIDE 12.5 MG: 12.5 | 30 days supply | Qty: 30 | Fill #0

## 2020-04-20 MED FILL — LISINOPRIL 10 MG TABS: 10 | 90 days supply | Qty: 90 | Fill #0

## 2020-04-20 NOTE — Telephone Encounter (Signed)
Called pt and lvm to return call.  

## 2020-04-25 NOTE — Telephone Encounter (Signed)
Called pt and lvm to return call.  

## 2020-05-16 MED FILL — ATORVASTATIN 80 MG TABLET: 80 | 90 days supply | Qty: 90 | Fill #3

## 2020-05-16 MED FILL — METFORMIN HCL 500 MG TABS: 500 | 90 days supply | Qty: 180 | Fill #1

## 2020-05-23 MED FILL — CLOPIDOGREL 75 MG TABLET: 75 | 90 days supply | Qty: 90 | Fill #3

## 2020-05-23 MED FILL — METOPROLOL TARTRATE 25 MG T: 25 | 90 days supply | Qty: 180 | Fill #3

## 2020-05-24 ENCOUNTER — Other Ambulatory Visit: Payer: Self-pay

## 2020-05-24 DIAGNOSIS — G5603 Carpal tunnel syndrome, bilateral upper limbs: Secondary | ICD-10-CM | POA: Diagnosis not present

## 2020-05-24 MED ORDER — EMPAGLIFLOZIN 25 MG PO TABS
25.0000 mg | ORAL_TABLET | Freq: Every day | ORAL | 3 refills | Status: DC
Start: 2020-05-24 — End: 2020-07-04

## 2020-05-24 MED FILL — JARDIANCE 25 MG TABLET: 25 | 90 days supply | Qty: 90 | Fill #0

## 2020-06-01 MED FILL — CLOPIDOGREL 75 MG TABLET: 75 | 90 days supply | Qty: 90 | Fill #3

## 2020-06-01 MED FILL — METOPROLOL TARTRATE 25 MG T: 25 | 90 days supply | Qty: 180 | Fill #3

## 2020-06-06 ENCOUNTER — Other Ambulatory Visit: Payer: Self-pay | Admitting: Medical

## 2020-06-06 ENCOUNTER — Encounter: Payer: Self-pay | Admitting: Medical

## 2020-06-06 ENCOUNTER — Encounter: Payer: Self-pay | Admitting: Internal Medicine

## 2020-06-06 MED ORDER — HYDROCHLOROTHIAZIDE 12.5 MG PO CAPS: 12.5000 mg | ORAL_CAPSULE | Freq: Every day | ORAL | 1 refills | Status: DC

## 2020-06-06 MED FILL — HYDROCHLOROTHIAZIDE 12.5 MG: 12.5 | 90 days supply | Qty: 90 | Fill #0

## 2020-07-04 ENCOUNTER — Other Ambulatory Visit: Payer: Self-pay | Admitting: Internal Medicine

## 2020-07-04 ENCOUNTER — Encounter: Payer: Self-pay | Admitting: Medical

## 2020-07-04 MED ORDER — CANAGLIFLOZIN 100 MG PO TABS
100.0000 mg | ORAL_TABLET | Freq: Every day | ORAL | 3 refills | Status: DC
Start: 2020-07-04 — End: 2020-09-28

## 2020-07-04 MED FILL — INVOKANA 100 MG TABLET: 100 | 90 days supply | Qty: 90 | Fill #0

## 2020-07-04 NOTE — Addendum Note (Signed)
Addended by: Caprice Beaver T on: 07/04/2020 01:24 PM   Modules accepted: Orders

## 2020-07-11 ENCOUNTER — Other Ambulatory Visit: Payer: Self-pay | Admitting: Medical

## 2020-07-11 ENCOUNTER — Other Ambulatory Visit (HOSPITAL_BASED_OUTPATIENT_CLINIC_OR_DEPARTMENT_OTHER): Payer: Self-pay | Admitting: Internal Medicine

## 2020-07-11 ENCOUNTER — Ambulatory Visit: Payer: 59 | Attending: Internal Medicine

## 2020-07-11 DIAGNOSIS — Z23 Encounter for immunization: Secondary | ICD-10-CM

## 2020-07-11 MED FILL — SILDENAFIL CITRATE 20 MG TA: 20 | 20 days supply | Qty: 100 | Fill #0

## 2020-07-11 MED FILL — FLUARIX QUADRIVALENT 0.5 ML: 0.5 | 1 days supply | Qty: 1 | Fill #0

## 2020-07-18 MED FILL — PFIZER-BIONTECH COVID-19 VA: 30 | 1 days supply | Qty: 0 | Fill #0

## 2020-08-16 ENCOUNTER — Other Ambulatory Visit: Payer: Self-pay | Admitting: Cardiovascular Disease

## 2020-08-16 ENCOUNTER — Other Ambulatory Visit: Payer: Self-pay | Admitting: Internal Medicine

## 2020-08-16 MED FILL — METFORMIN HCL 500 MG TABS: 500 | 90 days supply | Qty: 180 | Fill #2

## 2020-08-16 MED FILL — METOPROLOL TARTRATE 25 MG T: 25 | 90 days supply | Qty: 180 | Fill #0

## 2020-08-16 MED FILL — CLOPIDOGREL 75 MG TABLET: 75 | 90 days supply | Qty: 90 | Fill #0

## 2020-08-16 NOTE — Telephone Encounter (Signed)
Last VV was 10/2019 last OV 02/2019

## 2020-08-24 ENCOUNTER — Other Ambulatory Visit: Payer: Self-pay | Admitting: Cardiovascular Disease

## 2020-08-24 ENCOUNTER — Other Ambulatory Visit: Payer: Self-pay | Admitting: Internal Medicine

## 2020-08-24 MED FILL — ATORVASTATIN 80 MG TABLET: 80 | 30 days supply | Qty: 30 | Fill #0

## 2020-08-30 ENCOUNTER — Other Ambulatory Visit: Payer: Self-pay | Admitting: Internal Medicine

## 2020-09-26 ENCOUNTER — Other Ambulatory Visit: Payer: Self-pay | Admitting: Cardiovascular Disease

## 2020-09-27 ENCOUNTER — Other Ambulatory Visit: Payer: Self-pay | Admitting: Cardiovascular Disease

## 2020-09-27 MED FILL — INVOKANA 100 MG TABLET: 100 | 30 days supply | Qty: 30 | Fill #1

## 2020-09-27 MED FILL — ATORVASTATIN CALCIUM 80 MG: 80 | 30 days supply | Qty: 30 | Fill #0

## 2020-09-28 ENCOUNTER — Other Ambulatory Visit: Payer: Self-pay | Admitting: Internal Medicine

## 2020-09-28 ENCOUNTER — Other Ambulatory Visit: Payer: Self-pay | Admitting: Cardiovascular Disease

## 2020-09-28 ENCOUNTER — Other Ambulatory Visit: Payer: Self-pay

## 2020-09-28 MED ORDER — LISINOPRIL 10 MG PO TABS: 10.0000 mg | ORAL_TABLET | Freq: Every day | ORAL | 0 refills | Status: DC

## 2020-09-28 MED FILL — LISINOPRIL 10 MG TABS: 10 | 90 days supply | Qty: 90 | Fill #0

## 2020-10-18 ENCOUNTER — Other Ambulatory Visit: Payer: Self-pay | Admitting: Cardiovascular Disease

## 2020-10-19 ENCOUNTER — Other Ambulatory Visit: Payer: Self-pay | Admitting: Cardiovascular Disease

## 2020-10-21 MED FILL — INVOKANA 100 MG TABLET: 100 | 90 days supply | Qty: 90 | Fill #0

## 2020-10-21 MED FILL — ATORVASTATIN 80 MG TABLET: 80 | 30 days supply | Qty: 30 | Fill #0

## 2020-10-29 ENCOUNTER — Other Ambulatory Visit (HOSPITAL_BASED_OUTPATIENT_CLINIC_OR_DEPARTMENT_OTHER): Payer: Self-pay

## 2020-11-14 ENCOUNTER — Other Ambulatory Visit (HOSPITAL_BASED_OUTPATIENT_CLINIC_OR_DEPARTMENT_OTHER): Payer: Self-pay

## 2020-11-14 MED FILL — Metformin HCl Tab 500 MG: ORAL | 90 days supply | Qty: 180 | Fill #0 | Status: AC

## 2020-11-16 ENCOUNTER — Other Ambulatory Visit (HOSPITAL_BASED_OUTPATIENT_CLINIC_OR_DEPARTMENT_OTHER): Payer: Self-pay

## 2020-11-16 MED FILL — Canagliflozin Tab 100 MG: ORAL | 30 days supply | Qty: 30 | Fill #0 | Status: AC

## 2020-11-16 MED FILL — Atorvastatin Calcium Tab 80 MG (Base Equivalent): ORAL | 30 days supply | Qty: 30 | Fill #0 | Status: AC

## 2020-11-23 ENCOUNTER — Other Ambulatory Visit (HOSPITAL_BASED_OUTPATIENT_CLINIC_OR_DEPARTMENT_OTHER): Payer: Self-pay

## 2020-11-24 ENCOUNTER — Other Ambulatory Visit (HOSPITAL_BASED_OUTPATIENT_CLINIC_OR_DEPARTMENT_OTHER): Payer: Self-pay

## 2020-11-24 MED FILL — Metoprolol Tartrate Tab 25 MG: ORAL | 90 days supply | Qty: 180 | Fill #0 | Status: CN

## 2020-11-24 MED FILL — Clopidogrel Bisulfate Tab 75 MG (Base Equiv): ORAL | 90 days supply | Qty: 90 | Fill #0 | Status: CN

## 2020-12-01 ENCOUNTER — Other Ambulatory Visit (HOSPITAL_BASED_OUTPATIENT_CLINIC_OR_DEPARTMENT_OTHER): Payer: Self-pay

## 2020-12-05 ENCOUNTER — Other Ambulatory Visit (HOSPITAL_BASED_OUTPATIENT_CLINIC_OR_DEPARTMENT_OTHER): Payer: Self-pay

## 2020-12-05 MED FILL — Metoprolol Tartrate Tab 25 MG: ORAL | 90 days supply | Qty: 180 | Fill #0 | Status: AC

## 2020-12-05 MED FILL — Clopidogrel Bisulfate Tab 75 MG (Base Equiv): ORAL | 90 days supply | Qty: 90 | Fill #0 | Status: AC

## 2020-12-05 MED FILL — Hydrochlorothiazide Cap 12.5 MG: ORAL | 90 days supply | Qty: 90 | Fill #0 | Status: AC

## 2020-12-15 ENCOUNTER — Other Ambulatory Visit: Payer: Self-pay | Admitting: Medical

## 2020-12-15 ENCOUNTER — Other Ambulatory Visit (HOSPITAL_BASED_OUTPATIENT_CLINIC_OR_DEPARTMENT_OTHER): Payer: Self-pay

## 2020-12-15 MED ORDER — SILDENAFIL CITRATE 20 MG PO TABS
ORAL_TABLET | ORAL | 0 refills | Status: DC
  Filled 2020-12-15: qty 100, 20d supply, fill #0

## 2020-12-16 ENCOUNTER — Other Ambulatory Visit (HOSPITAL_BASED_OUTPATIENT_CLINIC_OR_DEPARTMENT_OTHER): Payer: Self-pay

## 2020-12-20 ENCOUNTER — Other Ambulatory Visit: Payer: Self-pay | Admitting: Medical

## 2020-12-20 ENCOUNTER — Other Ambulatory Visit: Payer: Self-pay | Admitting: Cardiovascular Disease

## 2020-12-20 ENCOUNTER — Other Ambulatory Visit (HOSPITAL_BASED_OUTPATIENT_CLINIC_OR_DEPARTMENT_OTHER): Payer: Self-pay

## 2020-12-20 MED ORDER — ATORVASTATIN CALCIUM 80 MG PO TABS
ORAL_TABLET | Freq: Every day | ORAL | 0 refills | Status: DC
  Filled 2020-12-20: qty 15, 15d supply, fill #0

## 2020-12-20 MED ORDER — LISINOPRIL 10 MG PO TABS
ORAL_TABLET | Freq: Every day | ORAL | 0 refills | Status: DC
  Filled 2020-12-20: qty 90, 90d supply, fill #0

## 2020-12-20 MED FILL — Canagliflozin Tab 100 MG: ORAL | 30 days supply | Qty: 30 | Fill #1 | Status: AC

## 2020-12-21 ENCOUNTER — Other Ambulatory Visit (HOSPITAL_BASED_OUTPATIENT_CLINIC_OR_DEPARTMENT_OTHER): Payer: Self-pay

## 2021-01-09 ENCOUNTER — Ambulatory Visit (INDEPENDENT_AMBULATORY_CARE_PROVIDER_SITE_OTHER): Payer: 59 | Admitting: Orthopedic Surgery

## 2021-01-09 ENCOUNTER — Other Ambulatory Visit: Payer: Self-pay | Admitting: Physician Assistant

## 2021-01-09 ENCOUNTER — Encounter: Payer: Self-pay | Admitting: Orthopedic Surgery

## 2021-01-09 ENCOUNTER — Ambulatory Visit: Payer: Self-pay

## 2021-01-09 VITALS — Ht 74.5 in | Wt 227.0 lb

## 2021-01-09 DIAGNOSIS — M869 Osteomyelitis, unspecified: Secondary | ICD-10-CM | POA: Diagnosis not present

## 2021-01-09 DIAGNOSIS — M79672 Pain in left foot: Secondary | ICD-10-CM

## 2021-01-09 NOTE — Progress Notes (Signed)
Office Visit Note   [name]           Date of Birth: [birth year]           MRN: [id number] Visit Date: 01/09/2021              Requested by: Mackie Pai, PA-C [address] [place],  [place] [address] PCP: Mackie Pai, PA-C  Chief Complaint  Patient presents with   Left Foot - Pain      HPI: Patient is a 60 year old gentleman who presents with a ulcer beneath the third metatarsal head left foot.  Patient states that his foot is red swollen and having drainage.  Patient states he just started taking some Augmentin.  Assessment & Plan: Visit Diagnoses:  1. Pain in left foot   2. Osteomyelitis of third toe of left foot ([place])     Plan: With the abscess and osteomyelitis and ulcer beneath the third metatarsal head we will plan for a left foot third ray amputation as an outpatient on Wednesday patient will continue taking the Augmentin we will place him in a postoperative shoe dry dressing and he will wash with soap and water daily.  Follow-Up Instructions: Return in about 2 weeks (around 01/23/2021).   Ortho Exam  Patient is alert, oriented, no adenopathy, well-dressed, normal affect, normal respiratory effort. Examination patient has a triphasic dorsalis pedis and posterior tibial pulse he has a very strong palpable posterior tibial pulse and good hair growth on the dorsum of his foot.  He has a necrotic ulcer beneath the third metatarsal head there is necrotic flexor tendon the third metatarsal is palpable within the wound.  There is sausage digit swelling of the third toe.  Patient's most recent hemoglobin A1c is 6.9 albumin most recently 3.9.  Patient's laboratory studies do not show any anemia of chronic disease.  His albumin has been within normal range.  Imaging: XR Foot 2 Views Left  Result Date: 01/09/2021 2 view radiographs of the left foot shows destructive changes at the base of the first metatarsal medial cuneiform with arthritic  changes through the midfoot there is dislocation of the MCP joint of the third toe but no visible destructive changes radiographically.  No images are attached to the encounter.  Labs: Lab Results  Component Value Date   HGBA1C 6.9 (H) 04/15/2020   HGBA1C 7.8 (H) 05/08/2019   HGBA1C 8.2 (H) 06/23/2018     Lab Results  Component Value Date   ALBUMIN 3.9 05/08/2019   ALBUMIN 2.7 (L) 06/23/2018   ALBUMIN 4.4 01/17/2018    Lab Results  Component Value Date   MG 2.1 05/08/2019   MG 2.2 06/26/2018   MG 3.0 (H) 06/26/2018   No results found for: VD25OH  No results found for: PREALBUMIN CBC EXTENDED Latest Ref Rng & Units 04/15/2020 01/06/2020 12/24/2019  WBC 3.8 - 10.8 Thousand/uL 7.3 8.9 9.3  RBC 4.20 - 5.80 Million/uL 4.42 4.21(L) 4.53  HGB 13.2 - 17.1 g/dL 13.7 12.6(L) 13.8  HCT 38.5 - 50.0 % 40.2 39.0 40.0  PLT 140 - 400 Thousand/uL 261 411(H) 313  NEUTROABS 1,500 - 7,800 cells/uL 4,314 - 6,901  LYMPHSABS 850 - 3,900 cells/uL 2,102 - 1,125     Body mass index is 28.76 kg/m.  Orders:  Orders Placed This Encounter  Procedures   XR Foot 2 Views Left   No orders of the defined types were placed in this encounter.    Procedures:  No procedures performed  Clinical Data: No additional findings.  ROS:  All other systems negative, except as noted in the HPI. Review of Systems  Objective: Vital Signs: Ht 6' 2.5" (1.892 m)   Wt 227 lb (103 kg)   BMI 28.76 kg/m   Specialty Comments:  No specialty comments available.  PMFS History: Patient Active Problem List   Diagnosis Date Noted   Osteomyelitis of second toe of right foot ([place])    Poorly controlled type 2 diabetes mellitus with circulatory disorder ([place]) 07/15/2018   Numbness in both hands 07/15/2018   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   Acute coronary syndrome ([place]) 06/22/2018   Nonhealing skin ulcer ([place]) 06/22/2018   Coronary artery disease involving native coronary artery with unstable angina pectoris ([place])  06/22/2018   Acute myocardial infarction ([place]) 06/22/2018   Hyperlipidemia    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   Vertigo 08/20/2016   Myringotomy tube status 08/20/2016   Ear fullness, left 08/20/2016   Asymmetrical left sensorineural hearing loss 02/06/2016   Neuropathy of left foot 02/03/2015   Charcot's joint of left foot, non-diabetic 02/03/2015   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   Cramping of hands 06/02/2014   Eustachian tube dysfunction 06/02/2014   HTN (hypertension) 05/07/2014   Obstructive sleep apnea 05/07/2014   Past Medical History:  Diagnosis Date   Acute myocardial infarction ([place]) 06/22/2018   Allergy    Charcot's joint of left foot, non-diabetic    Coronary artery disease involving native coronary artery with unstable angina pectoris ([place]) 06/22/2018   Diabetes ([place])    type 2   GERD (gastroesophageal reflux disease)    History of kidney stones    passed   Hyperlipidemia    Hypertension    Neuropathy of left foot    Pneumonia    As a child   Right foot ulcer ([place])    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   LIMA to LAD, SVG to Diag, SVG to OM2, SVG to [name], EVH via right thigh and leg   Type II diabetes mellitus with complication, uncontrolled ([place])    Uncontrolled type 2 diabetes mellitus ([place])     Family History  Problem Relation Age of Onset   Diabetes Mother    Stroke Father    Hypertension Father    Hearing loss Father    Colon polyps Father    Colon cancer Neg Hx    Esophageal cancer Neg Hx    Prostate cancer Neg Hx     Past Surgical History:  Procedure Laterality Date   AMPUTATION Right 01/06/2020   Procedure: RIGHT FOOT 2ND RAY AMPUTATION;  Surgeon: Newt Minion, MD;  Location: [facility];  Service: Orthopedics;  Laterality: Right;   COLONOSCOPY W/ POLYPECTOMY     CORONARY ARTERY BYPASS GRAFT N/A 06/25/2018   Procedure: CORONARY ARTERY BYPASS GRAFTING (CABG) TIMES FOUR USING LEFT INTERNAL MAMMARY ARTERY AND RIGHT ENDOSCOPICALLY  HARVESTED SAPHENOUS VEIN;  Surgeon: Rexene Alberts, MD;  Location: [facility];  Service: Open Heart Surgery;  Laterality: N/A;   ENDOVEIN HARVEST OF GREATER SAPHENOUS VEIN Right 06/25/2018   Procedure: ENDOVEIN HARVEST OF GREATER SAPHENOUS VEIN;  Surgeon: Rexene Alberts, MD;  Location: [facility];  Service: Open Heart Surgery;  Laterality: Right;   LEFT HEART CATH AND CORONARY ANGIOGRAPHY N/A 06/22/2018   Procedure: LEFT HEART CATH AND CORONARY ANGIOGRAPHY;  Surgeon: Nigel Mormon, MD;  Location: [facility];  Service: Cardiovascular;  Laterality: N/A;  surgical debridement  Right    Right foot ulcer   TEE WITHOUT CARDIOVERSION N/A 06/25/2018   Procedure: TRANSESOPHAGEAL ECHOCARDIOGRAM (TEE);  Surgeon: Rexene Alberts, MD;  Location: [facility];  Service: Open Heart Surgery;  Laterality: N/A;   Social History   Occupational History   Occupation: attorney  Tobacco Use   Smoking status: Never   Smokeless tobacco: Never  Vaping Use   Vaping Use: Never used  Substance and Sexual Activity   Alcohol use: Yes    Alcohol/week: 2.0 standard drinks    Types: 2 Cans of beer per week    Comment: 2 cans of beer or 2 borbon   Drug use: Never   Sexual activity: Not on file

## 2021-01-10 ENCOUNTER — Encounter (HOSPITAL_COMMUNITY): Payer: Self-pay | Admitting: Orthopedic Surgery

## 2021-01-10 ENCOUNTER — Other Ambulatory Visit: Payer: Self-pay

## 2021-01-10 NOTE — Progress Notes (Signed)
PCP - Mackie Pai, PA Cardiologist - Dr. Gwenlyn Found EKG -  Chest x-ray -  ECHO -  Cardiac Cath -  CPAP -   Fasting Blood Sugar:  100s Checks Blood Sugar:  1x/week  Blood Thinner Instructions: Follow your surgeon's instructions on when to stop Aspirin and clopidogrel (PLAVIX).  If no instructions were given by your surgeon then you will need to call the office to get those instructions.    Aspirin Instructions:   Per pt, instructed to not take any ASA or plavix DOS   ERAS Protcol - 10:45  COVID TEST- n/a  Anesthesia review: n/a  -------------  SDW INSTRUCTIONS:  Your procedure is scheduled on 6/15 Wednesday. Please report to [facility] Main Entrance "A" at 11:15 A.M., and check in at the Admitting office. Call this number if you have problems the morning of surgery: [phone]   Remember: Do not eat after midnight the night before your surgery  You may drink clear liquids until 10:45 AM the morning of your surgery.   Clear liquids allowed are: Water, Non-Citrus Juices (without pulp), Carbonated Beverages, Clear Tea, Black Coffee Only, and Gatorade   Medications to take morning of surgery with a sip of water include: metoprolol tartrate (LOPRESSOR)  As of today, STOP taking any Aspirin (unless otherwise instructed by your surgeon), Aleve, Naproxen, Ibuprofen, Motrin, Advil, Goody's, BC's, all herbal medications, fish oil, and all vitamins.  ** PLEASE check your blood sugar the morning of your surgery when you wake up and every 2 hours until you get to the Short Stay unit.  If your blood sugar is less than 70 mg/dL, you will need to treat for low blood sugar: Do not take insulin. Treat a low blood sugar (less than 70 mg/dL) with  cup of clear juice (cranberry or apple), 4 glucose tablets, OR glucose gel. Recheck blood sugar in 15 minutes after treatment (to make sure it is greater than 70 mg/dL). If your blood sugar is not greater than 70 mg/dL on recheck, call [phone]  for further instructions.  canagliflozin [facility]) 6/15 none metFORMIN (GLUCOPHAGE)  6/15 none   The Morning of Surgery Do not wear jewelry, Do not wear lotions, powders, colognes, or deodorant Men may shave face and neck. Do not bring valuables to the hospital. [facility] is not responsible for any belongings or valuables.  If you are a smoker, DO NOT Smoke 24 hours prior to surgery If you wear a CPAP at night please bring your mask the morning of surgery  Remember that you must have someone to transport you home after your surgery, and remain with you for 24 hours if you are discharged the same day.  Please bring cases for contacts, glasses, hearing aids, dentures or bridgework because it cannot be worn into surgery.   Patients discharged the day of surgery will not be allowed to drive home.   Please shower the NIGHT BEFORE/MORNING OF SURGERY (use antibacterial soap like DIAL soap if possible). Wear comfortable clothes the morning of surgery. Oral Hygiene is also important to reduce your risk of infection.  Remember - BRUSH YOUR TEETH THE MORNING OF SURGERY WITH YOUR REGULAR TOOTHPASTE  Patient denies shortness of breath, fever, cough and chest pain.

## 2021-01-11 ENCOUNTER — Encounter: Payer: 59 | Admitting: Physician Assistant

## 2021-01-11 ENCOUNTER — Encounter: Payer: Self-pay | Admitting: Orthopedic Surgery

## 2021-01-11 ENCOUNTER — Other Ambulatory Visit (HOSPITAL_BASED_OUTPATIENT_CLINIC_OR_DEPARTMENT_OTHER): Payer: Self-pay

## 2021-01-11 ENCOUNTER — Encounter (HOSPITAL_COMMUNITY): Payer: Self-pay | Admitting: Orthopedic Surgery

## 2021-01-11 ENCOUNTER — Encounter (HOSPITAL_COMMUNITY)
Admission: RE | Disposition: A | Discharge status: Home / Self Care | Payer: Self-pay | Source: Home / Self Care | Attending: Orthopedic Surgery

## 2021-01-11 ENCOUNTER — Other Ambulatory Visit: Payer: Self-pay | Admitting: Physician Assistant

## 2021-01-11 ENCOUNTER — Other Ambulatory Visit: Payer: Self-pay

## 2021-01-11 ENCOUNTER — Ambulatory Visit (HOSPITAL_COMMUNITY): Payer: 59 | Admitting: Certified Registered Nurse Anesthetist

## 2021-01-11 ENCOUNTER — Ambulatory Visit (HOSPITAL_COMMUNITY)
Admission: RE | Admit: 2021-01-11 | Discharge: 2021-01-11 | Disposition: A | Discharge status: Home / Self Care | Payer: 59 | Attending: Orthopedic Surgery | Admitting: Orthopedic Surgery

## 2021-01-11 DIAGNOSIS — I25110 Atherosclerotic heart disease of native coronary artery with unstable angina pectoris: Secondary | ICD-10-CM | POA: Diagnosis not present

## 2021-01-11 DIAGNOSIS — Z8249 Family history of ischemic heart disease and other diseases of the circulatory system: Secondary | ICD-10-CM | POA: Diagnosis not present

## 2021-01-11 DIAGNOSIS — I10 Essential (primary) hypertension: Secondary | ICD-10-CM | POA: Diagnosis not present

## 2021-01-11 DIAGNOSIS — L97519 Non-pressure chronic ulcer of other part of right foot with unspecified severity: Secondary | ICD-10-CM | POA: Diagnosis not present

## 2021-01-11 DIAGNOSIS — L02612 Cutaneous abscess of left foot: Secondary | ICD-10-CM | POA: Diagnosis not present

## 2021-01-11 DIAGNOSIS — Z833 Family history of diabetes mellitus: Secondary | ICD-10-CM | POA: Diagnosis not present

## 2021-01-11 DIAGNOSIS — Z823 Family history of stroke: Secondary | ICD-10-CM | POA: Diagnosis not present

## 2021-01-11 DIAGNOSIS — Z888 Allergy status to other drugs, medicaments and biological substances status: Secondary | ICD-10-CM | POA: Diagnosis not present

## 2021-01-11 DIAGNOSIS — E11621 Type 2 diabetes mellitus with foot ulcer: Secondary | ICD-10-CM | POA: Diagnosis not present

## 2021-01-11 DIAGNOSIS — Z89421 Acquired absence of other right toe(s): Secondary | ICD-10-CM | POA: Insufficient documentation

## 2021-01-11 DIAGNOSIS — Z91018 Allergy to other foods: Secondary | ICD-10-CM | POA: Diagnosis not present

## 2021-01-11 DIAGNOSIS — E1169 Type 2 diabetes mellitus with other specified complication: Secondary | ICD-10-CM | POA: Insufficient documentation

## 2021-01-11 DIAGNOSIS — M869 Osteomyelitis, unspecified: Secondary | ICD-10-CM | POA: Diagnosis not present

## 2021-01-11 HISTORY — PX: AMPUTATION: SHX166

## 2021-01-11 LAB — BASIC METABOLIC PANEL
Anion gap: 13 (ref 5–15)
BUN: 27 mg/dL — ABNORMAL HIGH (ref 6–20)
CO2: 21 mmol/L — ABNORMAL LOW (ref 22–32)
Calcium: 8.6 mg/dL — ABNORMAL LOW (ref 8.9–10.3)
Chloride: 98 mmol/L (ref 98–111)
Creatinine, Ser: 1.31 mg/dL — ABNORMAL HIGH (ref 0.61–1.24)
GFR, Estimated: >60 mL/min (ref >60)
Glucose, Bld: 182 mg/dL — ABNORMAL HIGH (ref 70–99)
Potassium: 4.3 mmol/L (ref 3.5–5.1)
Sodium: 132 mmol/L — ABNORMAL LOW (ref 135–145)

## 2021-01-11 LAB — CBC
HCT: 42 % (ref 39.0–52.0)
Hemoglobin: 14.2 g/dL (ref 13.0–17.0)
MCH: 30.7 pg (ref 26.0–34.0)
MCHC: 33.8 g/dL (ref 30.0–36.0)
MCV: 90.9 fL (ref 80.0–100.0)
Platelets: 330 10*3/uL (ref 150–400)
RBC: 4.62 MIL/uL (ref 4.22–5.81)
RDW: 11.9 % (ref 11.5–15.5)
WBC: 13.3 10*3/uL — ABNORMAL HIGH (ref 4.0–10.5)
nRBC: 0 % (ref 0.0–0.2)

## 2021-01-11 LAB — SURGICAL PCR SCREEN
MRSA, PCR: POSITIVE — AB
Staphylococcus aureus: POSITIVE — AB

## 2021-01-11 LAB — GLUCOSE, CAPILLARY
Glucose-Capillary: 207 mg/dL — ABNORMAL HIGH (ref 70–99)
Glucose-Capillary: 152 mg/dL — ABNORMAL HIGH (ref 70–99)

## 2021-01-11 SURGERY — AMPUTATION, FOOT, RAY
Anesthesia: Monitor Anesthesia Care | Laterality: Left

## 2021-01-11 MED ORDER — CEFAZOLIN SODIUM-DEXTROSE 2-4 GM/100ML-% IV SOLN
INTRAVENOUS | Status: AC
  Filled 2021-01-11: qty 100

## 2021-01-11 MED ORDER — DOXYCYCLINE HYCLATE 100 MG PO TABS
100.0000 mg | ORAL_TABLET | Freq: Two times a day (BID) | ORAL | 0 refills | Status: DC
  Filled 2021-01-11: qty 60, 30d supply, fill #0

## 2021-01-11 MED ORDER — LACTATED RINGERS IV SOLN: INTRAVENOUS | Status: DC

## 2021-01-11 MED ORDER — FENTANYL CITRATE (PF) 250 MCG/5ML IJ SOLN
INTRAMUSCULAR | Status: AC
  Filled 2021-01-11: qty 5

## 2021-01-11 MED ORDER — MIDAZOLAM HCL 2 MG/2ML IJ SOLN
INTRAMUSCULAR | Status: AC
  Filled 2021-01-11: qty 2

## 2021-01-11 MED ORDER — CHLORHEXIDINE GLUCONATE 0.12 % MT SOLN: 15.0000 mL | Freq: Once | OROMUCOSAL | Status: AC

## 2021-01-11 MED ORDER — LIDOCAINE HCL (PF) 1 % IJ SOLN
INTRAMUSCULAR | Status: AC
  Filled 2021-01-11: qty 30

## 2021-01-11 MED ORDER — FENTANYL CITRATE (PF) 250 MCG/5ML IJ SOLN
INTRAMUSCULAR | Status: DC | PRN
  Administered 2021-01-11: 50 ug via INTRAVENOUS

## 2021-01-11 MED ORDER — OXYCODONE-ACETAMINOPHEN 5-325 MG PO TABS
1.0000 | ORAL_TABLET | ORAL | 0 refills | Status: DC | PRN
  Filled 2021-01-11: qty 30, 5d supply, fill #0

## 2021-01-11 MED ORDER — 0.9 % SODIUM CHLORIDE (POUR BTL) OPTIME
TOPICAL | Status: DC | PRN
  Administered 2021-01-11: 1000 mL

## 2021-01-11 MED ORDER — ORAL CARE MOUTH RINSE: 15.0000 mL | Freq: Once | OROMUCOSAL | Status: AC

## 2021-01-11 MED ORDER — PROPOFOL 500 MG/50ML IV EMUL
INTRAVENOUS | Status: DC | PRN
  Administered 2021-01-11: 75 ug/kg/min via INTRAVENOUS

## 2021-01-11 MED ORDER — ONDANSETRON HCL 4 MG/2ML IJ SOLN
INTRAMUSCULAR | Status: DC | PRN
  Administered 2021-01-11: 4 mg via INTRAVENOUS

## 2021-01-11 MED ORDER — CEFAZOLIN SODIUM-DEXTROSE 2-4 GM/100ML-% IV SOLN: 2.0000 g | INTRAVENOUS | Status: DC

## 2021-01-11 MED ORDER — CHLORHEXIDINE GLUCONATE 0.12 % MT SOLN
OROMUCOSAL | Status: AC
  Administered 2021-01-11: 15 mL via OROMUCOSAL
  Filled 2021-01-11: qty 15

## 2021-01-11 MED ORDER — MIDAZOLAM HCL 2 MG/2ML IJ SOLN
INTRAMUSCULAR | Status: DC | PRN
  Administered 2021-01-11: 2 mg via INTRAVENOUS

## 2021-01-11 MED ORDER — FENTANYL CITRATE (PF) 100 MCG/2ML IJ SOLN
INTRAMUSCULAR | Status: AC
  Filled 2021-01-11: qty 2

## 2021-01-11 MED ORDER — FENTANYL CITRATE (PF) 100 MCG/2ML IJ SOLN: 25.0000 ug | INTRAMUSCULAR | Status: DC | PRN

## 2021-01-11 MED ORDER — LIDOCAINE HCL (PF) 1 % IJ SOLN
INTRAMUSCULAR | Status: DC | PRN
  Administered 2021-01-11: 20 mL

## 2021-01-11 MED ORDER — CEFAZOLIN SODIUM-DEXTROSE 2-3 GM-%(50ML) IV SOLR
INTRAVENOUS | Status: DC | PRN
  Administered 2021-01-11: 2 g via INTRAVENOUS

## 2021-01-11 SURGICAL SUPPLY — 32 items
BLADE SAW SGTL MED 73X18.5 STR (BLADE) ×2 IMPLANT
BLADE SURG 21 STRL SS (BLADE) ×2 IMPLANT
BNDG COHESIVE 4X5 TAN STRL (GAUZE/BANDAGES/DRESSINGS) ×2 IMPLANT
BNDG GAUZE ELAST 4 BULKY (GAUZE/BANDAGES/DRESSINGS) ×2 IMPLANT
CNTNR URN SCR LID CUP LEK RST (MISCELLANEOUS) ×1 IMPLANT
CONT SPEC 4OZ STRL OR WHT (MISCELLANEOUS) ×1
COVER SURGICAL LIGHT HANDLE (MISCELLANEOUS) ×4 IMPLANT
COVER WAND RF STERILE (DRAPES) ×2 IMPLANT
DRAPE U-SHAPE 47X51 STRL (DRAPES) ×4 IMPLANT
DRSG ADAPTIC 3X8 NADH LF (GAUZE/BANDAGES/DRESSINGS) ×2 IMPLANT
DRSG PAD ABDOMINAL 8X10 ST (GAUZE/BANDAGES/DRESSINGS) ×2 IMPLANT
DURAPREP 26ML APPLICATOR (WOUND CARE) ×2 IMPLANT
ELECT REM PT RETURN 9FT ADLT (ELECTROSURGICAL) ×2
ELECTRODE REM PT RTRN 9FT ADLT (ELECTROSURGICAL) ×1 IMPLANT
GAUZE SPONGE 4X4 12PLY STRL (GAUZE/BANDAGES/DRESSINGS) ×2 IMPLANT
GLOVE BIOGEL PI IND STRL 9 (GLOVE) ×1 IMPLANT
GLOVE BIOGEL PI INDICATOR 9 (GLOVE) ×1
GLOVE SURG ORTHO 9.0 STRL STRW (GLOVE) ×2 IMPLANT
GOWN STRL REUS W/ TWL XL LVL3 (GOWN DISPOSABLE) ×2 IMPLANT
GOWN STRL REUS W/TWL XL LVL3 (GOWN DISPOSABLE) ×2
KIT BASIN OR (CUSTOM PROCEDURE TRAY) ×2 IMPLANT
KIT TURNOVER KIT B (KITS) ×2 IMPLANT
NEEDLE 22X1 1/2 (OR ONLY) (NEEDLE) ×2 IMPLANT
NS IRRIG 1000ML POUR BTL (IV SOLUTION) ×2 IMPLANT
PACK ORTHO EXTREMITY (CUSTOM PROCEDURE TRAY) ×2 IMPLANT
PAD ARMBOARD 7.5X6 YLW CONV (MISCELLANEOUS) ×4 IMPLANT
STOCKINETTE IMPERVIOUS LG (DRAPES) IMPLANT
SUT ETHILON 2 0 PSLX (SUTURE) ×2 IMPLANT
SYR CONTROL 10ML LL (SYRINGE) ×2 IMPLANT
TOWEL GREEN STERILE (TOWEL DISPOSABLE) ×2 IMPLANT
TUBE CONNECTING 12X1/4 (SUCTIONS) ×2 IMPLANT
YANKAUER SUCT BULB TIP NO VENT (SUCTIONS) ×2 IMPLANT

## 2021-01-11 NOTE — H&P (Signed)
Joshua Branch is an 60 y.o. male.   Chief Complaint: Left foot third ray osteomyelitis HPI: Patient is a 60 year old gentleman who presents with a ulcer beneath the third metatarsal head left foot.  Patient states that his foot is red swollen and having drainage.  Patient states he just started taking some Augmentin.  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   LIMA to LAD, SVG to Diag, SVG to OM2, SVG to [name], EVH via right thigh and leg   Type II diabetes mellitus with complication, uncontrolled ([place])    Uncontrolled type 2 diabetes mellitus ([place])     Past Surgical History:  Procedure Laterality Date   AMPUTATION Right 01/06/2020   Procedure: RIGHT FOOT 2ND RAY AMPUTATION;  Surgeon: Newt Minion, MD;  Location: [facility];  Service: Orthopedics;  Laterality: Right;   COLONOSCOPY W/ POLYPECTOMY     CORONARY ARTERY BYPASS GRAFT N/A 06/25/2018   Procedure: CORONARY ARTERY BYPASS GRAFTING (CABG) TIMES FOUR USING LEFT INTERNAL MAMMARY ARTERY AND RIGHT ENDOSCOPICALLY HARVESTED SAPHENOUS VEIN;  Surgeon: Rexene Alberts, MD;  Location: [facility] [facility] [place];  Service: Open Heart Surgery;  Laterality: N/A;   ENDOVEIN HARVEST OF GREATER SAPHENOUS VEIN Right 06/25/2018   Procedure: ENDOVEIN HARVEST OF GREATER SAPHENOUS VEIN;  Surgeon: Rexene Alberts, MD;  Location: [facility] [place] [facility];  Service: Open Heart Surgery;  Laterality: Right;   LEFT HEART CATH AND CORONARY ANGIOGRAPHY N/A 06/22/2018   Procedure: LEFT HEART CATH AND CORONARY ANGIOGRAPHY;  Surgeon: Nigel Mormon, MD;  Location: [facility];  Service: Cardiovascular;  Laterality: N/A;   surgical debridement  Right    Right foot ulcer   TEE WITHOUT CARDIOVERSION N/A 06/25/2018   Procedure: TRANSESOPHAGEAL ECHOCARDIOGRAM (TEE);  Surgeon: Rexene Alberts, MD;  Location: [facility];  Service: Open Heart Surgery;  Laterality: N/A;    Family History  Problem Relation Age of Onset   Diabetes Mother    Stroke Father    Hypertension Father    Hearing loss Father    Colon polyps Father    Colon cancer Neg Hx    Esophageal cancer Neg Hx    Prostate cancer Neg Hx    Social History:  reports that he has never smoked. He has never used smokeless tobacco. He reports current alcohol use of about 2.0 standard drinks of alcohol per week. He reports that he does not use drugs.  Allergies:  Allergies  Allergen Reactions   Apricot Kernel Oil [Prunus] Swelling    THROAT   Cherry Swelling    THROAT   Jardiance [Empagliflozin] Diarrhea and Nausea And Vomiting   Other Other (See Comments)    Fruits with a pit. Throat swells up. Can have them if cooked    Peach [Prunus Persica] Swelling    THROAT   Plum Pulp Swelling    THROAT    No medications prior to admission.    No results  found for this or any previous visit (from the past 48 hour(s)). XR Foot 2 Views Left  Result Date: 01/09/2021 2 view radiographs of the left foot shows destructive changes at the base of the first metatarsal medial cuneiform with arthritic changes through the midfoot there is dislocation of the MCP joint of the third toe but no visible destructive changes radiographically.   Review of Systems  All other systems reviewed and are negative.  Height 6' 2.5" (1.892 m), weight 103 kg. Physical Exam  Patient is alert, oriented, no adenopathy, well-dressed, normal affect, normal respiratory effort. Examination patient has a triphasic dorsalis pedis and posterior tibial pulse he has a very strong palpable posterior tibial pulse  and good hair growth on the dorsum of his foot.  He has a necrotic ulcer beneath the third metatarsal head there is necrotic flexor tendon the third metatarsal is palpable within the wound.  There is sausage digit swelling of the third toe.  Patient's most recent hemoglobin A1c is 6.9 albumin most recently 3.9.  Patient's laboratory studies do not show any anemia of chronic disease.  His albumin has been within normal range.Heart RRR Lungs Clear Assessment/Plan . Pain in left foot  2. Osteomyelitis of third toe of left foot ([place])       Plan: With the abscess and osteomyelitis and ulcer beneath the third metatarsal head we will plan for a left foot third ray amputation as an outpatient on Wednesday patient will continue taking the Augmentin we will place him in a postoperative shoe dry dressing and he will wash with soap and water daily.  Bevely Palmer Dexter Signor, PA 01/11/2021, 6:32 AM

## 2021-01-11 NOTE — Anesthesia Preprocedure Evaluation (Signed)
Anesthesia Evaluation  Patient identified by MRN, date of birth, ID band Patient awake    Airway Mallampati: II  TM Distance: >3 FB     Dental   Pulmonary sleep apnea , pneumonia,    breath sounds clear to auscultation       Cardiovascular hypertension, + angina + CAD and + Past MI   Rhythm:Regular Rate:Normal     Neuro/Psych  Neuromuscular disease    GI/Hepatic Neg liver ROS, GERD  ,  Endo/Other  diabetes  Renal/GU negative Renal ROS     Musculoskeletal   Abdominal   Peds  Hematology   Anesthesia Other Findings   Reproductive/Obstetrics                             Anesthesia Physical Anesthesia Plan  ASA: 3  Anesthesia Plan: MAC   Post-op Pain Management:    Induction: Intravenous  PONV Risk Score and Plan: 2 and Ondansetron, Dexamethasone and Midazolam  Airway Management Planned: Simple Face Mask  Additional Equipment:   Intra-op Plan:   Post-operative Plan:   Informed Consent: I have reviewed the patients History and Physical, chart, labs and discussed the procedure including the risks, benefits and alternatives for the proposed anesthesia with the patient or authorized representative who has indicated his/her understanding and acceptance.     Dental advisory given  Plan Discussed with: CRNA and Anesthesiologist  Anesthesia Plan Comments:         Anesthesia Quick Evaluation

## 2021-01-11 NOTE — Transfer of Care (Signed)
Immediate Anesthesia Transfer of Care Note  [name]  Procedure(s) Performed: LEFT FOOT 3RD RAY AMPUTATION (Left)  Patient Location: PACU  Anesthesia Type:MAC  Level of Consciousness: awake, alert  and oriented  Airway & Oxygen Therapy: Patient Spontanous Breathing  Post-op Assessment: Report given to RN and Post -op Vital signs reviewed and stable  Post vital signs: Reviewed and stable  Last Vitals:  Vitals Value Taken Time  BP 133/84 01/11/21 1224  Temp    Pulse 81 01/11/21 1226  Resp 18 01/11/21 1226  SpO2 96 % 01/11/21 1226  Vitals shown include unvalidated device data.  Last Pain:  Vitals:   01/11/21 1033  TempSrc:   PainSc: 1       Patients Stated Pain Goal: 4 (43/88/87 5797)  Complications: No notable events documented.

## 2021-01-11 NOTE — Anesthesia Postprocedure Evaluation (Signed)
Anesthesia Post Note  [name]  Procedure(s) Performed: LEFT FOOT 3RD RAY AMPUTATION (Left)     Patient location during evaluation: PACU Anesthesia Type: MAC Level of consciousness: awake Pain management: pain level controlled Respiratory status: spontaneous breathing Cardiovascular status: stable Postop Assessment: no apparent nausea or vomiting Anesthetic complications: no   No notable events documented.  Last Vitals:  Vitals:   01/11/21 1224 01/11/21 1240  BP: 133/84 125/71  Pulse: 80 80  Resp: 14 16  Temp: (!) 36.4 C   SpO2: 98% 99%    Last Pain:  Vitals:   01/11/21 1224  TempSrc:   PainSc: 0-No pain                 Tyner Codner

## 2021-01-11 NOTE — Op Note (Signed)
01/11/2021  12:33 PM  PATIENT:  Joshua Branch    PRE-OPERATIVE DIAGNOSIS:  Osteomyelitis Left 3rd Metatarsal  POST-OPERATIVE DIAGNOSIS:  Same  PROCEDURE:  LEFT FOOT 3RD RAY AMPUTATION Local tissue rearrangement for wound closure 10 x 4 7 eaters.  SURGEON:  Newt Minion, MD  PHYSICIAN ASSISTANT:None ANESTHESIA:   General  PREOPERATIVE INDICATIONS:  Joshua Branch is a  60 y.o. male with a diagnosis of Osteomyelitis Left 3rd Metatarsal who failed conservative measures and elected for surgical management.    The risks benefits and alternatives were discussed with the patient preoperatively including but not limited to the risks of infection, bleeding, nerve injury, cardiopulmonary complications, the need for revision surgery, among others, and the patient was willing to proceed.  OPERATIVE IMPLANTS: none  _0 @  OPERATIVE FINDINGS: Patient did have a large abscess at the metatarsal head.  The abscess tissue and bone was sent for cultures.  OPERATIVE PROCEDURE: Patient was brought the operating room and underwent a MAC anesthetic.  The left lower extremity was prepped using DuraPrep draped into a sterile field a timeout was called.  Patient underwent a local block with 30 cc of 1% lidocaine plain a racquet incision was made around the ulcerative tissue that extended plantar and dorsal.  This left the wound that was 10 x 4 cm.  There is a large abscess involving the MTP joint of the third toe there is involvement of the bone.  The resection was carried back to healthy viable margins.  An oscillating saw was used to resect the third ray.  The bone soft tissue and abscess were sent for cultures.  After the margins were clear the wound was irrigated with normal saline.  2-0 nylon was used to perform local tissue rearrangement for wound closure 10 x 4 cm.  A sterile dressing was applied patient was taken the PACU in stable condition   DISCHARGE PLANNING:  Antibiotic duration:  Preoperative antibiotics  Weightbearing: Touchdown weightbearing on the left  Pain medication: Percocet  Dressing care/ Wound VAC: Follow-up in the office 1 week to change the dressing  Ambulatory devices: Kneeling scooter [place] or crutches  Discharge to: Home.  Follow-up: In the office 1 week post operative.

## 2021-01-11 NOTE — Interval H&P Note (Signed)
History and Physical Interval Note:  01/11/2021 11:31 AM  Joshua Branch  has presented today for surgery, with the diagnosis of Osteomyelitis Left 3rd Metatarsal.  The various methods of treatment have been discussed with the patient and family. After consideration of risks, benefits and other options for treatment, the patient has consented to  Procedure(s): LEFT FOOT 3RD RAY AMPUTATION (Left) as a surgical intervention.  [name] history has been reviewed, patient examined, no change in status, stable for surgery.  I have reviewed the patient's chart and labs.  Questions were answered to the patient's satisfaction.     Newt Minion

## 2021-01-11 NOTE — Interval H&P Note (Signed)
History and Physical Interval Note:  01/11/2021 10:22 AM  Joshua Branch  has presented today for surgery, with the diagnosis of Osteomyelitis Left 3rd Metatarsal.  The various methods of treatment have been discussed with the patient and family. After consideration of risks, benefits and other options for treatment, the patient has consented to  Procedure(s): LEFT FOOT 3RD RAY AMPUTATION (Left) as a surgical intervention.  [name] history has been reviewed, patient examined, no change in status, stable for surgery.  I have reviewed the patient's chart and labs.  Questions were answered to the patient's satisfaction.     Newt Minion

## 2021-01-12 ENCOUNTER — Other Ambulatory Visit (HOSPITAL_BASED_OUTPATIENT_CLINIC_OR_DEPARTMENT_OTHER): Payer: Self-pay

## 2021-01-12 ENCOUNTER — Encounter (HOSPITAL_COMMUNITY): Payer: Self-pay | Admitting: Orthopedic Surgery

## 2021-01-16 LAB — ANAEROBIC CULTURE W GRAM STAIN

## 2021-01-17 ENCOUNTER — Telehealth: Payer: Self-pay

## 2021-01-17 NOTE — Telephone Encounter (Signed)
Pt confirmed that he is taking Doxy bid and will continue this. He has follow up tomorrow confirmed appt with pt.

## 2021-01-17 NOTE — Telephone Encounter (Signed)
-----   Message from Newt Minion, MD sent at 01/16/2021 11:16 AM EDT ----- Call patient.  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  Continue taking the doxycycline. ----- Message ----- From: Interface, Lab In [place] Sent: 01/11/2021   3:01 PM EDT To: Newt Minion, MD

## 2021-01-18 ENCOUNTER — Encounter: Payer: Self-pay | Admitting: Family

## 2021-01-18 ENCOUNTER — Other Ambulatory Visit: Payer: Self-pay

## 2021-01-18 ENCOUNTER — Ambulatory Visit (INDEPENDENT_AMBULATORY_CARE_PROVIDER_SITE_OTHER): Payer: 59 | Admitting: Family

## 2021-01-18 DIAGNOSIS — Z89422 Acquired absence of other left toe(s): Secondary | ICD-10-CM | POA: Insufficient documentation

## 2021-01-18 HISTORY — DX: Acquired absence of other left toe(s): Z89.422

## 2021-01-18 NOTE — Progress Notes (Signed)
Post-Op Visit Note   [name]           Date of Birth: October 06, 1960           MRN: [id number] Visit Date: 01/18/2021 PCP: Mackie Pai, PA-C  Chief Complaint: No chief complaint on file.   HPI:  HPI Patient is a 60 year old gentleman seen status post left foot third ray amputation on June 15.  Has been using a kneeling scooter. has no concerns.  He is taking doxycycline.  Intraoperative culture showed MRSA  Ortho Exam On examination of left foot the dorsal and plantar incisions are well approximated with sutures. No gaping, no drainage. No erythema or sign of infection.  Visit Diagnoses: No diagnosis found.  Plan: begin daily dial soap cleansing. Dry dressings. Continue nonweightbearing. Follow up in 2 weeks for suture removal.  Follow-Up Instructions: No follow-ups on file.   Imaging: No results found.  Orders:  No orders of the defined types were placed in this encounter.  No orders of the defined types were placed in this encounter.    PMFS History: Patient Active Problem List   Diagnosis Date Noted   Osteomyelitis of third toe of left foot ([name]    Cutaneous abscess of left foot    Osteomyelitis of second toe of right foot ([place])    Poorly controlled type 2 diabetes mellitus with circulatory disorder ([place]) 07/15/2018   Numbness in both hands 07/15/2018   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   Acute coronary syndrome ([place]) 06/22/2018   Nonhealing skin ulcer ([place]) 06/22/2018   Coronary artery disease involving native coronary artery with unstable angina pectoris ([place]) 06/22/2018   Acute myocardial infarction ([place]) 06/22/2018   Hyperlipidemia    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   Vertigo 08/20/2016   Myringotomy tube status 08/20/2016   Ear fullness, left 08/20/2016   Asymmetrical left sensorineural hearing loss 02/06/2016   Neuropathy of left foot 02/03/2015   Charcot's joint of left foot, non-diabetic 02/03/2015   Leg length  inequality 02/03/2015   Cramping of hands 06/02/2014   Eustachian tube dysfunction 06/02/2014   HTN (hypertension) 05/07/2014   Obstructive sleep apnea 05/07/2014   Past Medical History:  Diagnosis Date   Acute myocardial infarction ([place]) 06/22/2018   Allergy    Charcot's joint of left foot, non-diabetic    Coronary artery disease involving native coronary artery with unstable angina pectoris ([place]) 06/22/2018   Diabetes ([place])    type 2   GERD (gastroesophageal reflux disease)    History of kidney stones    passed   Hyperlipidemia    Hypertension    Neuropathy of left foot    Pneumonia    As a child   Right foot ulcer ([place])    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   LIMA to LAD, SVG to Diag, SVG to OM2, SVG to [name], EVH via right thigh and leg   Type II diabetes mellitus with complication, uncontrolled ([place])    Uncontrolled type 2 diabetes mellitus ([place])     Family History  Problem Relation Age of Onset   Diabetes Mother    Stroke Father    Hypertension Father    Hearing loss Father    Colon polyps Father    Colon cancer Neg Hx    Esophageal cancer Neg Hx    Prostate cancer Neg Hx     Past Surgical History:  Procedure Laterality Date   AMPUTATION Right 01/06/2020   Procedure: RIGHT  FOOT 2ND RAY AMPUTATION;  Surgeon: Newt Minion, MD;  Location: [place];  Service: Orthopedics;  Laterality: Right;   AMPUTATION Left 01/11/2021   Procedure: LEFT FOOT 3RD RAY AMPUTATION;  Surgeon: Newt Minion, MD;  Location: [place];  Service: Orthopedics;  Laterality: Left;   COLONOSCOPY W/ POLYPECTOMY     CORONARY ARTERY BYPASS GRAFT N/A 06/25/2018   Procedure: CORONARY ARTERY BYPASS GRAFTING (CABG) TIMES FOUR USING LEFT INTERNAL MAMMARY ARTERY AND RIGHT ENDOSCOPICALLY HARVESTED SAPHENOUS VEIN;  Surgeon: Rexene Alberts, MD;  Location: [place];  Service: Open Heart Surgery;  Laterality: N/A;   ENDOVEIN HARVEST OF GREATER SAPHENOUS VEIN Right 06/25/2018   Procedure: ENDOVEIN HARVEST OF GREATER SAPHENOUS  VEIN;  Surgeon: Rexene Alberts, MD;  Location: [place];  Service: Open Heart Surgery;  Laterality: Right;   LEFT HEART CATH AND CORONARY ANGIOGRAPHY N/A 06/22/2018   Procedure: LEFT HEART CATH AND CORONARY ANGIOGRAPHY;  Surgeon: Nigel Mormon, MD;  Location: [facility];  Service: Cardiovascular;  Laterality: N/A;   surgical debridement  Right    Right foot ulcer   TEE WITHOUT CARDIOVERSION N/A 06/25/2018   Procedure: TRANSESOPHAGEAL ECHOCARDIOGRAM (TEE);  Surgeon: Rexene Alberts, MD;  Location: [place];  Service: Open Heart Surgery;  Laterality: N/A;   Social History   Occupational History   Occupation: attorney  Tobacco Use   Smoking status: Never   Smokeless tobacco: Never  Vaping Use   Vaping Use: Never used  Substance and Sexual Activity   Alcohol use: Yes    Alcohol/week: 2.0 standard drinks    Types: 2 Cans of beer per week    Comment: 2 cans of beer or 2 borbon   Drug use: Never   Sexual activity: Not on file

## 2021-01-20 ENCOUNTER — Other Ambulatory Visit (HOSPITAL_BASED_OUTPATIENT_CLINIC_OR_DEPARTMENT_OTHER): Payer: Self-pay

## 2021-01-20 ENCOUNTER — Other Ambulatory Visit: Payer: Self-pay | Admitting: Cardiovascular Disease

## 2021-01-27 ENCOUNTER — Other Ambulatory Visit (HOSPITAL_BASED_OUTPATIENT_CLINIC_OR_DEPARTMENT_OTHER): Payer: Self-pay

## 2021-01-27 ENCOUNTER — Ambulatory Visit (INDEPENDENT_AMBULATORY_CARE_PROVIDER_SITE_OTHER): Payer: 59 | Admitting: Internal Medicine

## 2021-01-27 ENCOUNTER — Other Ambulatory Visit: Payer: Self-pay

## 2021-01-27 ENCOUNTER — Encounter: Payer: Self-pay | Admitting: Internal Medicine

## 2021-01-27 ENCOUNTER — Ambulatory Visit (INDEPENDENT_AMBULATORY_CARE_PROVIDER_SITE_OTHER): Payer: 59 | Admitting: Physician Assistant

## 2021-01-27 ENCOUNTER — Encounter: Payer: Self-pay | Admitting: Physician Assistant

## 2021-01-27 VITALS — BP 140/88 | HR 82 | Ht 74.5 in | Wt 227.0 lb

## 2021-01-27 DIAGNOSIS — E1165 Type 2 diabetes mellitus with hyperglycemia: Secondary | ICD-10-CM | POA: Diagnosis not present

## 2021-01-27 DIAGNOSIS — R200 Anesthesia of skin: Secondary | ICD-10-CM

## 2021-01-27 DIAGNOSIS — E782 Mixed hyperlipidemia: Secondary | ICD-10-CM | POA: Diagnosis not present

## 2021-01-27 DIAGNOSIS — Z89422 Acquired absence of other left toe(s): Secondary | ICD-10-CM

## 2021-01-27 DIAGNOSIS — E1159 Type 2 diabetes mellitus with other circulatory complications: Secondary | ICD-10-CM

## 2021-01-27 LAB — POCT GLYCOSYLATED HEMOGLOBIN (HGB A1C): Hemoglobin A1C: 7.4 % — AB (ref 4.0–5.6)

## 2021-01-27 MED ORDER — NITROGLYCERIN 0.2 MG/HR TD PT24
0.2000 mg | MEDICATED_PATCH | Freq: Every day | TRANSDERMAL | 1 refills | Status: DC
  Filled 2021-01-27: qty 30, 30d supply, fill #0

## 2021-01-27 MED ORDER — DAPAGLIFLOZIN PROPANEDIOL 10 MG PO TABS
10.0000 mg | ORAL_TABLET | Freq: Every day | ORAL | 3 refills | Status: DC
  Filled 2021-01-27: qty 90, 90d supply, fill #0
  Filled 2021-04-23: qty 90, 90d supply, fill #1
  Filled 2021-08-01: qty 90, 90d supply, fill #2

## 2021-01-27 MED ORDER — PENTOXIFYLLINE ER 400 MG PO TBCR
400.0000 mg | EXTENDED_RELEASE_TABLET | Freq: Three times a day (TID) | ORAL | 3 refills | Status: DC
  Filled 2021-01-27: qty 90, 30d supply, fill #0

## 2021-01-27 NOTE — Progress Notes (Signed)
Patient ID: Joshua Branch, male   DOB: [birth year], 60 y.o.   MRN: [id number]   This visit occurred during the SARS-CoV-2 public health emergency.  Safety protocols were in place, including screening questions prior to the visit, additional usage of staff PPE, and extensive cleaning of exam room while observing appropriate contact time as indicated for disinfecting solutions.   HPI: Joshua Branch is a 60 y.o.-year-old male, presenting for follow-up for DM2, dx before 2017, non-insulin-dependent, uncontrolled, with long-term complications (CAD - s/p AMI 05/2018- s/p CABG x4 06/2018, nonhealing skin ulcers >> toe amputations).  Last visit 1 year and 2 months ago (virtual).  He is not usually compliant with recommended appointment intervals.  Interim history: No blurry vision, nausea, chest pain.  He does have nocturia x1, which is stable. He has numbness in feet and hands, not improved after starting B12. His left foot is in a boot -  left third toe recently amputated.  Reviewed his HbA1c levels: Lab Results  Component Value Date   HGBA1C 6.9 (H) 04/15/2020   HGBA1C 7.8 (H) 05/08/2019   HGBA1C 8.2 (H) 06/23/2018   HGBA1C 11.7 (H) 01/17/2018   HGBA1C 10.8 (H) 12/07/2016   HGBA1C 9.2 (H) 11/30/2015   He is on: - Metformin 1000 mg 2x a day, with meals - started 12/2017 >> 500 mg 2x a day - Jardiance 10 mg in a.m.  >> abd. Pain, nausea, diarrhea >> Invokana 100 mg daily -tolerates this well.  Pt checks his sugars once a day:  - am: 155-165 >> 102-135 >> 120s >> 90s-125 >> 105-120, 165 with infection; today 136 - 2h after b'fast: 130-160 >> 140-145 >> 130-175 >> n/c - before lunch:  - 2h after lunch: n/c - before dinner: n/c - 2h after dinner:  130-160 >> 140-150 >> up to 175 >> 170-190 - bedtime: n/c - nighttime: n/c Lowest sugar was 107 >> 95 >> 105; it is unclear at which level he has hypoglycemia awareness Highest sugar was 181 >> 175 >> 165.  Glucometer: True Metrix  Pt's  meals are: - Breakfast: protein shake, smoothie, yoghurt, occas. Eggs and bacon - Lunch: sandwich, chips - Dinner: chicken, veggie - Snacks: 3 a day He started to change his diet in 02/2018: He eliminated fast foods, eats less sweets, less alcohol.  -No CKD, last BUN/creatinine:  Lab Results  Component Value Date   BUN 27 (H) 01/11/2021   BUN 22 04/15/2020   CREATININE 1.31 (H) 01/11/2021   CREATININE 1.11 04/15/2020  On lisinopril 10.  -+ HL; last set of lipids: Lab Results  Component Value Date   CHOL 114 04/15/2020   HDL 36 (L) 04/15/2020   LDLCALC 59 04/15/2020   LDLDIRECT 93.0 01/17/2018   TRIG 103 04/15/2020   CHOLHDL 3.2 04/15/2020  On Lipitor 80.  - last eye exam was in2021: reportedly no DR but HT-ive retinopathy. Dr. Rex Kras [facility].  -+ Numbness and tingling in his feet and numbness in fingertips  On ASA 81.  Pt has FH of DM in mother, sister.  He continues to have numbness in fingers.  B12 was normal: Lab Results  Component Value Date   VITAMINB12 236 05/08/2019   VITAMINB12 642 07/15/2018  We started a 1000 mcg daily B12 supplement.  ROS: + See HPI  I reviewed pt's medications, allergies, PMH, social hx, family hx, and changes were documented in the history of present illness. Otherwise, unchanged from my initial visit note.  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   LIMA to LAD, SVG to Diag, SVG to OM2, SVG to [name], EVH via right thigh and leg   Type II diabetes  mellitus with complication, uncontrolled ([place])    Uncontrolled type 2 diabetes mellitus ([place])    Past Surgical History:  Procedure Laterality Date   AMPUTATION Right 01/06/2020   Procedure: RIGHT FOOT 2ND RAY AMPUTATION;  Surgeon: Newt Minion, MD;  Location: [place];  Service: Orthopedics;  Laterality: Right;   AMPUTATION Left 01/11/2021   Procedure: LEFT FOOT 3RD RAY AMPUTATION;  Surgeon: Newt Minion, MD;  Location: [facility];  Service: Orthopedics;  Laterality: Left;   COLONOSCOPY W/ POLYPECTOMY     CORONARY ARTERY BYPASS GRAFT N/A 06/25/2018   Procedure: CORONARY ARTERY BYPASS GRAFTING (CABG) TIMES FOUR USING LEFT INTERNAL MAMMARY ARTERY AND RIGHT ENDOSCOPICALLY HARVESTED SAPHENOUS VEIN;  Surgeon: Rexene Alberts, MD;  Location: [place];  Service: Open Heart Surgery;  Laterality: N/A;   ENDOVEIN HARVEST OF GREATER SAPHENOUS VEIN Right 06/25/2018   Procedure: ENDOVEIN HARVEST OF GREATER SAPHENOUS VEIN;  Surgeon: Rexene Alberts, MD;  Location: [place];  Service: Open Heart Surgery;  Laterality: Right;   LEFT HEART CATH AND CORONARY ANGIOGRAPHY N/A 06/22/2018   Procedure: LEFT HEART CATH AND CORONARY ANGIOGRAPHY;  Surgeon: Nigel Mormon, MD;  Location: [facility];  Service: Cardiovascular;  Laterality: N/A;   surgical debridement  Right    Right foot ulcer   TEE WITHOUT CARDIOVERSION N/A 06/25/2018   Procedure: TRANSESOPHAGEAL ECHOCARDIOGRAM (TEE);  Surgeon: Rexene Alberts, MD;  Location: [facility];  Service: Open Heart Surgery;  Laterality: N/A;   Social History   Socioeconomic History   Marital status: Married    Spouse name: [name]   Number of children: 2   Years of education: Not on file   Highest education level: Professional school degree (e.g., MD, DDS, DVM, JD)  Occupational History   Occupation: attorney  Tobacco Use   Smoking status: Never   Smokeless tobacco: Never  Vaping Use   Vaping Use: Never used  Substance and Sexual Activity   Alcohol use: Yes     Alcohol/week: 2.0 standard drinks    Types: 2 Cans of beer per week    Comment: 2 cans of beer or 2 borbon   Drug use: Never   Sexual activity: Not on file  Other Topics Concern   Not on file  Social History Narrative   Not on file   Social Determinants of Health   Financial Resource Strain: Not on file  Food Insecurity: Not on file  Transportation Needs: Not on file  Physical Activity: Not on file  Stress: Not on file  Social Connections: Not on file  Intimate Partner Violence: Not on file   Current Outpatient Medications on File Prior to Visit  Medication Sig Dispense Refill   aspirin EC 81 MG tablet  Take 81 mg by mouth daily.     atorvastatin (LIPITOR) 80 MG tablet TAKE 1 TABLET (80 MG TOTAL) BY MOUTH DAILY AT 6 PM. (Patient taking differently: Take 80 mg by mouth daily.) 15 tablet 0   Blood Glucose Monitoring Suppl (FREESTYLE LITE) DEVI      canagliflozin (INVOKANA) 100 MG TABS tablet TAKE 1 TABLET BY MOUTH ONCE DAILY (Patient taking differently: Take 100 mg by mouth daily.) 30 tablet 3   cetirizine (ZYRTEC) 10 MG tablet Take 1 tablet (10 mg total) by mouth daily. (Patient taking differently: Take 10 mg by mouth daily as needed for allergies.) 30 tablet 11   clopidogrel (PLAVIX) 75 MG tablet TAKE 1 TABLET (75 MG TOTAL) BY MOUTH DAILY. (Patient taking differently: Take 75 mg by mouth daily.) 90 tablet 3   Coenzyme Q10 (CO Q-10) 300 MG CAPS Take 300 mg by mouth daily.     doxycycline (VIBRA-TABS) 100 MG tablet Take 1 tablet (100 mg total) by mouth 2 (two) times daily. 60 tablet 0   glucose blood (FREESTYLE LITE) test strip USE AS INSTRUCTED TO CHECK 1-2X A DAY 100 strip 1   hydrochlorothiazide (MICROZIDE) 12.5 MG capsule TAKE 1 CAPSULE (12.5 MG TOTAL) BY MOUTH DAILY. (Patient taking differently: Take 12.5 mg by mouth daily.) 90 capsule 1   Lancets MISC Check sugars 3 times a day E11.9 100 each 0   lisinopril (ZESTRIL) 10 MG tablet TAKE 1 TABLET (10 MG TOTAL) BY MOUTH DAILY.  (Patient taking differently: Take 10 mg by mouth daily.) 90 tablet 0   metFORMIN (GLUCOPHAGE) 500 MG tablet TAKE 1 TABLET (500 MG TOTAL) BY MOUTH 2 (TWO) TIMES DAILY WITH A MEAL. (Patient taking differently: Take 500 mg by mouth in the morning and at bedtime.) 360 tablet 1   metoprolol tartrate (LOPRESSOR) 25 MG tablet TAKE 1 TABLET (25 MG TOTAL) BY MOUTH 2 (TWO) TIMES DAILY. (Patient taking differently: Take 25 mg by mouth 2 (two) times daily.) 180 tablet 3   Multiple Vitamin (MULTIVITAMIN WITH MINERALS) TABS tablet Take 1 tablet by mouth daily.     oxyCODONE-acetaminophen (PERCOCET) 5-325 MG tablet Take 1 tablet by mouth every 4 (four) hours as needed. 30 tablet 0   sildenafil (REVATIO) 20 MG tablet TAKE 2-5 TABLETS BY MOUTH ONE HOUR BEFORE SEX AS NEEDED **MAX OF 5 TABLETS WITHIN A 24 HOUR PERIOD** (Patient taking differently: Take 20-100 mg by mouth See admin instructions. Take 1 hour prior to sex as needed. Max of 100mg  within a 24 hour period.) 100 tablet 0   vitamin B-12 (CYANOCOBALAMIN) 1000 MCG tablet Take 1,000 mcg by mouth daily.     No current facility-administered medications on file prior to visit.   Allergies  Allergen Reactions   Apricot Kernel Oil [Prunus] Swelling    THROAT   Cherry Swelling    THROAT   Jardiance [Empagliflozin] Diarrhea and Nausea And Vomiting   Other Other (See Comments)    Fruits with a pit. Throat swells up. Can have them if cooked    Peach [Prunus Persica] Swelling    THROAT   Plum Pulp Swelling    THROAT   Family History  Problem Relation Age of Onset   Diabetes Mother    Stroke Father    Hypertension Father    Hearing loss Father    Colon polyps Father    Colon cancer Neg Hx    Esophageal cancer Neg Hx    Prostate cancer Neg Hx    PE: BP  140/88 (BP Location: Left Arm, Patient Position: Sitting, Cuff Size: Normal)   Pulse 82   Ht 6' 2.5" (1.892 m)   Wt 227 lb (103 kg)   SpO2 96%   BMI 28.76 kg/m  Wt Readings from Last 3 Encounters:   01/27/21 227 lb (103 kg)  01/11/21 227 lb 1.2 oz (103 kg)  01/09/21 227 lb (103 kg)   Constitutional: overweight, in NAD Eyes: PERRLA, EOMI, no exophthalmos ENT: moist mucous membranes, no thyromegaly, no cervical lymphadenopathy Cardiovascular: RRR, No MRG Respiratory: CTA B Gastrointestinal: abdomen soft, NT, ND, BS+ Musculoskeletal: + deformities (toe amputations -see below) -left foot in boot, strength intact in all 4 Skin: moist, warm, no rashes Neurological: no tremor with outstretched hands, DTR normal in all 4  ASSESSMENT: 1. DM2, non-insulin-dependent, uncontrolled, with long-term complications - CAD - s/p AMI - s/p CABG x4 06/2018-sees Dr. Gwenlyn Found - right foot second ray amputation 01/06/2020 - left foot third ray amputation 01/11/2021  2.  Numbness in fingers  3. HL  PLAN:  1. Patient with history of longstanding, uncontrolled, type 2 diabetes, on oral antidiabetic regimen with metformin and SGLT2 inhibitor.  She returns after long absence of a year and 2 months, when we had a video visit.  Therefore, his latest HbA1c is from 03/2020.  At that time, this was at goal, at 6.9%. -We tried to start Jardiance after his MI and subsequent CABG, but he could not tolerate this due to abdominal pain.  We then started Invokana, which he tolerates well.  His sugars improved significantly after he started this and also after he started to change his diet in 2019 by cutting down fast foods, sweets, alcohol.  His highest HbA1c was 11.7% before he made these dietary changes.  We also discussed about possibly switching to a more plant-based diet at last visit. -he now returns after a long absence of more than a year, during which he had 2 toe amputations due to osteomyelitis. -At today's visit, he mentions that sugars are higher during the infection in his left toe, which is now resolving after amputation.  Sugars are also improving, but still higher than his sugars before the surgery. -At  today's visit, we reviewed his regimen.  We discussed about the results of the CANVAS Study, which showed an increased rate of toe amputations with Invokana, however, this finding was not confirmed in subsequent studies and this was taken off the package insert for Invokana since.  However, at this visit, I still suggested to switch from [place] to [place], with which he agrees.  Since sugars are coming down and they were at goal before his surgery, we will not change his regimen otherwise.  Of note, he is using a half maximal dose of metformin as he had nausea with the higher dose. - I suggested to:  Patient Instructions  Please continue: - Metformin 500 mg 2x a day  Change from Invokana to: - Farxiga 10 mg before breakfast  You can try: - Alpha Lipoic Acid 600 mcg 2x day  Please return in 3-4 months with your sugar log.   - we checked his HbA1c: 7.4% (higher) - advised to check sugars at different times of the day - 1x a day, rotating check times - advised for yearly eye exams >> he is UTD - return to clinic in 3-4 months   2.  Numbness in fingertips -B12 level was normal at last visit, but in 04/2019 had another checked and the level was  still normal but very low in the normal range -He continues 1000 mcg B12 daily, but this did not help -At this visit, I also suggested alpha-lipoic acid.  3. HL -Reviewed latest lipid panel from 03/2020: HDL was slightly low, the rest of the fractions at goal: Lab Results  Component Value Date   CHOL 114 04/15/2020   HDL 36 (L) 04/15/2020   LDLCALC 59 04/15/2020   LDLDIRECT 93.0 01/17/2018   TRIG 103 04/15/2020   CHOLHDL 3.2 04/15/2020  -He continues on Lipitor 80 mg daily without side effects  Philemon Kingdom, MD PhD [facility] Endocrinology

## 2021-01-27 NOTE — Progress Notes (Addendum)
Office Visit Note   [name]           Date of Birth: [birth year]           MRN: [id number] Visit Date: 01/27/2021              Requested by: Mackie Pai, PA-C [place],  [place] [address] PCP: Mackie Pai, PA-C  Chief Complaint  Patient presents with   Left Foot - Routine Post Op      HPI: Patient is in follow-up today.  He is 2 weeks status post left foot third ray amputation.  He is currently on a 30-day course of doxycycline.  He noticed some odor coming from the wound.  Assessment & Plan: Visit Diagnoses: No diagnosis found.  Plan: We will place him on [place] and have him use nitroglycerin patches.  We will follow-up closely in 1 week.  Follow-Up Instructions: No follow-ups on file.   Ortho Exam  Patient is alert, oriented, no adenopathy, well-dressed, normal affect, normal respiratory effort. Examination of his amputation site he has some dehiscence along the plantar surface with some foul odor.  No ascending cellulitis on the plantar surface on the dorsal surface surgical stitches are intact and he does have well apposed wound edges but has some surrounding erythema.  Also some surrounding skin maceration.  He does have biphasic pulse by Doppler  Imaging: No results found. No images are attached to the encounter.  Labs: Lab Results  Component Value Date   HGBA1C 7.4 (A) 01/27/2021   HGBA1C 6.9 (H) 04/15/2020   HGBA1C 7.8 (H) 05/08/2019   REPTSTATUS 01/16/2021 FINAL 01/11/2021   CULT  01/11/2021    RARE METHICILLIN RESISTANT STAPHYLOCOCCUS AUREUS NO ANAEROBES ISOLATED Performed at [facility], [place] [address], [place], [place] [address]    LABORGA METHICILLIN RESISTANT STAPHYLOCOCCUS AUREUS 01/11/2021     Lab Results  Component Value Date   ALBUMIN 3.9 05/08/2019   ALBUMIN 2.7 (L) 06/23/2018   ALBUMIN 4.4 01/17/2018    Lab Results  Component Value Date   MG 2.1 05/08/2019   MG 2.2 06/26/2018    MG 3.0 (H) 06/26/2018   No results found for: VD25OH  No results found for: PREALBUMIN CBC EXTENDED Latest Ref Rng & Units 01/11/2021 04/15/2020 01/06/2020  WBC 4.0 - 10.5 K/uL 13.3(H) 7.3 8.9  RBC 4.22 - 5.81 MIL/uL 4.62 4.42 4.21(L)  HGB 13.0 - 17.0 g/dL 14.2 13.7 12.6(L)  HCT 39.0 - 52.0 % 42.0 40.2 39.0  PLT 150 - 400 K/uL 330 261 411(H)  NEUTROABS 1,500 - 7,800 cells/uL - 4,314 -  LYMPHSABS 850 - 3,900 cells/uL - 2,102 -     There is no height or weight on file to calculate BMI.  Orders:  No orders of the defined types were placed in this encounter.  No orders of the defined types were placed in this encounter.    Procedures: No procedures performed  Clinical Data: No additional findings.  ROS:  All other systems negative, except as noted in the HPI. Review of Systems  Objective: Vital Signs: There were no vitals taken for this visit.  Specialty Comments:  No specialty comments available.  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 ([place]) 01/18/2021   Osteomyelitis of third toe of left foot ([name]    Cutaneous abscess of left foot  Osteomyelitis of second toe of right foot ([place])    Poorly controlled type 2 diabetes mellitus with circulatory disorder ([place]) 07/15/2018   Numbness in both hands 07/15/2018   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   Acute coronary syndrome ([place]) 06/22/2018   Nonhealing skin ulcer ([place]) 06/22/2018   Coronary artery disease involving native coronary artery with unstable angina pectoris ([place]) 06/22/2018   Acute myocardial infarction ([place]) 06/22/2018   Hyperlipidemia    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   Vertigo 08/20/2016   Myringotomy tube status 08/20/2016   Ear fullness, left 08/20/2016   Asymmetrical left sensorineural hearing loss 02/06/2016   Neuropathy of left foot 02/03/2015   Charcot's joint of left foot, non-diabetic  02/03/2015   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   Cramping of hands 06/02/2014   Eustachian tube dysfunction 06/02/2014   HTN (hypertension) 05/07/2014   Obstructive sleep apnea 05/07/2014   Past Medical History:  Diagnosis Date   Acute myocardial infarction ([place]) 06/22/2018   Allergy    Charcot's joint of left foot, non-diabetic    Coronary artery disease involving native coronary artery with unstable angina pectoris ([place]) 06/22/2018   Diabetes ([place])    type 2   GERD (gastroesophageal reflux disease)    History of kidney stones    passed   Hyperlipidemia    Hypertension    Neuropathy of left foot    Pneumonia    As a child   Right foot ulcer ([place])    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   LIMA to LAD, SVG to Diag, SVG to OM2, SVG to [name], EVH via right thigh and leg   Type II diabetes mellitus with complication, uncontrolled ([place])    Uncontrolled type 2 diabetes mellitus ([place])     Family History  Problem Relation Age of Onset   Diabetes Mother    Stroke Father    Hypertension Father    Hearing loss Father    Colon polyps Father    Colon cancer Neg Hx    Esophageal cancer Neg Hx    Prostate cancer Neg Hx     Past Surgical History:  Procedure Laterality Date   AMPUTATION Right 01/06/2020   Procedure: RIGHT FOOT 2ND RAY AMPUTATION;  Surgeon: Newt Minion, MD;  Location: [facility];  Service: Orthopedics;  Laterality: Right;   AMPUTATION Left 01/11/2021   Procedure: LEFT FOOT 3RD RAY AMPUTATION;  Surgeon: Newt Minion, MD;  Location: [facility];  Service: Orthopedics;  Laterality: Left;   COLONOSCOPY W/ POLYPECTOMY     CORONARY ARTERY BYPASS GRAFT N/A 06/25/2018   Procedure: CORONARY ARTERY BYPASS GRAFTING (CABG) TIMES FOUR USING LEFT INTERNAL MAMMARY ARTERY AND RIGHT ENDOSCOPICALLY HARVESTED SAPHENOUS VEIN;  Surgeon: Rexene Alberts, MD;  Location: [facility];  Service: Open Heart Surgery;  Laterality: N/A;   ENDOVEIN HARVEST OF GREATER SAPHENOUS VEIN Right 06/25/2018   Procedure: ENDOVEIN HARVEST  OF GREATER SAPHENOUS VEIN;  Surgeon: Rexene Alberts, MD;  Location: [facility];  Service: Open Heart Surgery;  Laterality: Right;   LEFT HEART CATH AND CORONARY ANGIOGRAPHY N/A 06/22/2018   Procedure: LEFT HEART CATH AND CORONARY ANGIOGRAPHY;  Surgeon: Nigel Mormon, MD;  Location: [facility];  Service: Cardiovascular;  Laterality: N/A;   surgical debridement  Right    Right foot ulcer   TEE WITHOUT CARDIOVERSION N/A 06/25/2018   Procedure: TRANSESOPHAGEAL ECHOCARDIOGRAM (TEE);  Surgeon: Rexene Alberts, MD;  Location: [facility];  Service: Open Heart Surgery;  Laterality: N/A;   Social History   Occupational History   Occupation: attorney  Tobacco Use   Smoking status: Never   Smokeless tobacco: Never  Vaping Use   Vaping Use: Never used  Substance and Sexual Activity   Alcohol use: Yes    Alcohol/week: 2.0 standard drinks    Types: 2 Cans of beer per week    Comment: 2 cans of beer or 2 borbon   Drug use: Never   Sexual activity: Not on file

## 2021-01-27 NOTE — Patient Instructions (Addendum)
Please continue: - Metformin 500 mg 2x a day  Change from Invokana to: - Farxiga 10 mg before breakfast  You can try: - Alpha Lipoic Acid 600 mcg 2x day  Please return in 3-4 months with your sugar log.

## 2021-02-01 ENCOUNTER — Encounter: Payer: Self-pay | Admitting: Family

## 2021-02-01 ENCOUNTER — Ambulatory Visit (INDEPENDENT_AMBULATORY_CARE_PROVIDER_SITE_OTHER): Payer: 59 | Admitting: Family

## 2021-02-01 ENCOUNTER — Other Ambulatory Visit: Payer: Self-pay

## 2021-02-01 DIAGNOSIS — M869 Osteomyelitis, unspecified: Secondary | ICD-10-CM

## 2021-02-01 DIAGNOSIS — T8781 Dehiscence of amputation stump: Secondary | ICD-10-CM

## 2021-02-01 NOTE — Progress Notes (Signed)
Post-Op Visit Note   [name]           Date of Birth: 04-08-1961           MRN: [id number] Visit Date: 02/01/2021 PCP: Mackie Pai, PA-C  Chief Complaint: No chief complaint on file.   HPI:  HPI The patient is a 60 year old gentleman seen status post partial ray amputation third toe left foot he is about 3 weeks out.  Unfortunately he has had some dehiscence of his plantar incision.  He has been using a kneeling scooter continue with dry dressing changes.  He is taking a doxycycline course. Ortho Exam On examination of the left foot the dorsum of his foot is no longer red and warm.  The edema is much improved.  The incision is well-healed dorsally.  In the webspace and plantar aspect of his foot unfortunately there is some dehiscence there is proud granulation in the wound bed.  There is a mild odor there is some surrounding maceration on the plantar aspect of his foot there is no active drainage no purulence.  Visit Diagnoses:  1. Dehiscence of amputation stump ([place])   2. Osteomyelitis of third toe of left foot ([place])     Plan: Continue with your doxycycline.  Continue daily Dial soap cleansing.  Dry dressing changes.  Packed the plantar wound open.  Sutures were removed today.  He will follow-up in the office in about 10 days.  Discussed strict return precautions.  Follow-Up Instructions: Return in about 8 days (around 02/09/2021).   Imaging: No results found.  Orders:  No orders of the defined types were placed in this encounter.  No orders of the defined types were placed in this encounter.    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 ([place]) 01/18/2021   Osteomyelitis of third toe of left foot ([name]    Cutaneous abscess of left foot    Osteomyelitis of second toe of right foot ([place])    Poorly controlled type 2 diabetes mellitus with circulatory disorder ([place])  07/15/2018   Numbness in both hands 07/15/2018   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   Acute coronary syndrome ([place]) 06/22/2018   Nonhealing skin ulcer ([place]) 06/22/2018   Coronary artery disease involving native coronary artery with unstable angina pectoris ([place]) 06/22/2018   Acute myocardial infarction ([place]) 06/22/2018   Hyperlipidemia    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   Vertigo 08/20/2016   Myringotomy tube status 08/20/2016   Ear fullness, left 08/20/2016   Asymmetrical left sensorineural hearing loss 02/06/2016   Neuropathy of left foot 02/03/2015   Charcot's joint of left foot, non-diabetic 02/03/2015   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   Cramping of hands 06/02/2014   Eustachian tube dysfunction 06/02/2014   HTN (hypertension) 05/07/2014   Obstructive sleep apnea 05/07/2014   Past Medical History:  Diagnosis Date   Acute myocardial infarction ([place]) 06/22/2018   Allergy    Charcot's joint of left foot, non-diabetic    Coronary artery disease involving native coronary artery with unstable angina pectoris ([place]) 06/22/2018   Diabetes ([place])    type 2   GERD (gastroesophageal reflux disease)    History of kidney stones    passed   Hyperlipidemia    Hypertension    Neuropathy of left foot    Pneumonia    As a child   Right foot  ulcer ([place])    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   LIMA to LAD, SVG to Diag, SVG to OM2, SVG to [name], EVH via right thigh and leg   Type II diabetes mellitus with complication, uncontrolled ([place])    Uncontrolled type 2 diabetes mellitus ([place])     Family History  Problem Relation Age of Onset   Diabetes Mother    Stroke Father    Hypertension Father    Hearing loss Father    Colon polyps Father    Colon cancer Neg Hx    Esophageal cancer Neg Hx    Prostate cancer Neg Hx     Past Surgical History:  Procedure Laterality Date   AMPUTATION Right 01/06/2020   Procedure: RIGHT FOOT 2ND RAY AMPUTATION;  Surgeon: Newt Minion, MD;  Location: [facility];  Service: Orthopedics;  Laterality: Right;   AMPUTATION Left 01/11/2021   Procedure: LEFT FOOT 3RD RAY AMPUTATION;  Surgeon: Newt Minion, MD;  Location: [facility];  Service: Orthopedics;  Laterality: Left;   COLONOSCOPY W/ POLYPECTOMY     CORONARY ARTERY BYPASS GRAFT N/A 06/25/2018   Procedure: CORONARY ARTERY BYPASS GRAFTING (CABG) TIMES FOUR USING LEFT INTERNAL MAMMARY ARTERY AND RIGHT ENDOSCOPICALLY HARVESTED SAPHENOUS VEIN;  Surgeon: Rexene Alberts, MD;  Location: [facility];  Service: Open Heart Surgery;  Laterality: N/A;   ENDOVEIN HARVEST OF GREATER SAPHENOUS VEIN Right 06/25/2018   Procedure: ENDOVEIN HARVEST OF GREATER SAPHENOUS VEIN;  Surgeon: Rexene Alberts, MD;  Location: [facility];  Service: Open Heart Surgery;  Laterality: Right;   LEFT HEART CATH AND CORONARY ANGIOGRAPHY N/A 06/22/2018   Procedure: LEFT HEART CATH AND CORONARY ANGIOGRAPHY;  Surgeon: Nigel Mormon, MD;  Location: [facility];  Service: Cardiovascular;  Laterality: N/A;   surgical debridement  Right    Right foot ulcer   TEE WITHOUT CARDIOVERSION N/A 06/25/2018   Procedure: TRANSESOPHAGEAL ECHOCARDIOGRAM (TEE);  Surgeon: Rexene Alberts, MD;  Location: [facility];  Service: Open Heart Surgery;  Laterality: N/A;   Social History   Occupational History   Occupation: attorney  Tobacco Use   Smoking status: Never   Smokeless tobacco: Never  Vaping Use   Vaping Use: Never used  Substance and Sexual Activity   Alcohol use: Yes    Alcohol/week: 2.0 standard drinks    Types: 2 Cans of beer per week    Comment: 2 cans of beer or 2 borbon   Drug use: Never   Sexual activity: Not on file

## 2021-02-05 ENCOUNTER — Other Ambulatory Visit: Payer: Self-pay | Admitting: Internal Medicine

## 2021-02-06 ENCOUNTER — Other Ambulatory Visit (HOSPITAL_COMMUNITY): Payer: Self-pay

## 2021-02-06 ENCOUNTER — Other Ambulatory Visit: Payer: Self-pay | Admitting: Cardiovascular Disease

## 2021-02-06 ENCOUNTER — Other Ambulatory Visit (HOSPITAL_BASED_OUTPATIENT_CLINIC_OR_DEPARTMENT_OTHER): Payer: Self-pay

## 2021-02-06 MED ORDER — METFORMIN HCL 500 MG PO TABS
500.0000 mg | ORAL_TABLET | Freq: Two times a day (BID) | ORAL | 3 refills | Status: DC
  Filled 2021-02-06: qty 180, 90d supply, fill #0
  Filled 2021-04-23: qty 180, 90d supply, fill #1

## 2021-02-06 MED ORDER — ATORVASTATIN CALCIUM 80 MG PO TABS
ORAL_TABLET | Freq: Every day | ORAL | 0 refills | Status: DC
  Filled 2021-02-06: qty 15, 15d supply, fill #0

## 2021-02-08 ENCOUNTER — Ambulatory Visit (INDEPENDENT_AMBULATORY_CARE_PROVIDER_SITE_OTHER): Payer: 59 | Admitting: Family

## 2021-02-08 ENCOUNTER — Other Ambulatory Visit: Payer: Self-pay

## 2021-02-08 ENCOUNTER — Encounter: Payer: Self-pay | Admitting: Family

## 2021-02-08 DIAGNOSIS — M869 Osteomyelitis, unspecified: Secondary | ICD-10-CM

## 2021-02-08 NOTE — Progress Notes (Signed)
Post-Op Visit Note   [name]           Date of Birth: 19-Apr-1961           MRN: [id number] Visit Date: 02/08/2021 PCP: Mackie Pai, PA-C  Chief Complaint:  Chief Complaint  Patient presents with   Left Foot - Follow-up    3rd toe osteo    HPI:  HPI The patient is a 60 year old gentleman seen today in follow-up for third ray amputation with dehiscence.  He has been packing open with dry gauze using a kneeling scooter for mobility He feels there is marked improvement since last viewing. Ortho Exam On examination of the left foot the dorsum of his third ray amputation is well-healed from the webspace plantarly there is dehiscence of the incision with surrounding maceration.  The wound is filled in with granulation in the bed.  This is about 8 mm deep this does not probe to bone or tendon.  There is no active drainage no odor no erythema of his foot no warmth.  Visit Diagnoses:  1. Osteomyelitis of third toe of left foot ([place])     Plan: He will begin packing open with silver cell.  After cleaning the wound daily.  Continue nonweightbearing.  Follow-up in the office in 2 weeks  Follow-Up Instructions: Return in about 2 weeks (around 02/22/2021).   Imaging: No results found.  Orders:  No orders of the defined types were placed in this encounter.  No orders of the defined types were placed in this encounter.    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 ([place]) 01/18/2021   Osteomyelitis of third toe of left foot ([name]    Cutaneous abscess of left foot    Osteomyelitis of second toe of right foot ([place])    Poorly controlled type 2 diabetes mellitus with circulatory disorder ([place]) 07/15/2018   Numbness in both hands 07/15/2018   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   Acute coronary syndrome ([place]) 06/22/2018   Nonhealing skin ulcer ([place]) 06/22/2018   Coronary artery disease involving  native coronary artery with unstable angina pectoris ([place]) 06/22/2018   Acute myocardial infarction ([place]) 06/22/2018   Hyperlipidemia    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   Vertigo 08/20/2016   Myringotomy tube status 08/20/2016   Ear fullness, left 08/20/2016   Asymmetrical left sensorineural hearing loss 02/06/2016   Neuropathy of left foot 02/03/2015   Charcot's joint of left foot, non-diabetic 02/03/2015   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   Cramping of hands 06/02/2014   Eustachian tube dysfunction 06/02/2014   HTN (hypertension) 05/07/2014   Obstructive sleep apnea 05/07/2014   Past Medical History:  Diagnosis Date   Acute myocardial infarction ([place]) 06/22/2018   Allergy    Charcot's joint of left foot, non-diabetic    Coronary artery disease involving native coronary artery with unstable angina pectoris ([place]) 06/22/2018   Diabetes ([place])    type 2   GERD (gastroesophageal reflux disease)    History of kidney stones    passed   Hyperlipidemia    Hypertension    Neuropathy of left foot    Pneumonia    As a child   Right foot ulcer ([place])    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   LIMA to LAD, SVG to Diag, SVG to OM2, SVG to [name], EVH via right thigh and leg  Type II diabetes mellitus with complication, uncontrolled ([place])    Uncontrolled type 2 diabetes mellitus ([place])     Family History  Problem Relation Age of Onset   Diabetes Mother    Stroke Father    Hypertension Father    Hearing loss Father    Colon polyps Father    Colon cancer Neg Hx    Esophageal cancer Neg Hx    Prostate cancer Neg Hx     Past Surgical History:  Procedure Laterality Date   AMPUTATION Right 01/06/2020   Procedure: RIGHT FOOT 2ND RAY AMPUTATION;  Surgeon: Newt Minion, MD;  Location: [facility];  Service: Orthopedics;  Laterality: Right;   AMPUTATION Left 01/11/2021   Procedure: LEFT FOOT 3RD RAY AMPUTATION;  Surgeon: Newt Minion, MD;  Location: [facility];  Service: Orthopedics;   Laterality: Left;   COLONOSCOPY W/ POLYPECTOMY     CORONARY ARTERY BYPASS GRAFT N/A 06/25/2018   Procedure: CORONARY ARTERY BYPASS GRAFTING (CABG) TIMES FOUR USING LEFT INTERNAL MAMMARY ARTERY AND RIGHT ENDOSCOPICALLY HARVESTED SAPHENOUS VEIN;  Surgeon: Rexene Alberts, MD;  Location: [facility];  Service: Open Heart Surgery;  Laterality: N/A;   ENDOVEIN HARVEST OF GREATER SAPHENOUS VEIN Right 06/25/2018   Procedure: ENDOVEIN HARVEST OF GREATER SAPHENOUS VEIN;  Surgeon: Rexene Alberts, MD;  Location: [facility];  Service: Open Heart Surgery;  Laterality: Right;   LEFT HEART CATH AND CORONARY ANGIOGRAPHY N/A 06/22/2018   Procedure: LEFT HEART CATH AND CORONARY ANGIOGRAPHY;  Surgeon: Nigel Mormon, MD;  Location: [facility];  Service: Cardiovascular;  Laterality: N/A;   surgical debridement  Right    Right foot ulcer   TEE WITHOUT CARDIOVERSION N/A 06/25/2018   Procedure: TRANSESOPHAGEAL ECHOCARDIOGRAM (TEE);  Surgeon: Rexene Alberts, MD;  Location: [facility];  Service: Open Heart Surgery;  Laterality: N/A;   Social History   Occupational History   Occupation: attorney  Tobacco Use   Smoking status: Never   Smokeless tobacco: Never  Vaping Use   Vaping Use: Never used  Substance and Sexual Activity   Alcohol use: Yes    Alcohol/week: 2.0 standard drinks    Types: 2 Cans of beer per week    Comment: 2 cans of beer or 2 borbon   Drug use: Never   Sexual activity: Not on file

## 2021-02-09 ENCOUNTER — Telehealth: Payer: Self-pay | Admitting: Cardiovascular Disease

## 2021-02-09 NOTE — Telephone Encounter (Signed)
*  STAT* If patient is at the pharmacy, call can be transferred to refill team.   1. Which medications need to be refilled? (please list name of each medication and dose if known)  atorvastatin (LIPITOR) 80 MG tablet  2. Which pharmacy/location (including street and city if local pharmacy) is medication to be sent to? [facility]  3. Do they need a 30 day or 90 day supply? 90 day supply

## 2021-02-14 ENCOUNTER — Telehealth: Payer: Self-pay | Admitting: Family

## 2021-02-14 NOTE — Telephone Encounter (Signed)
Called pt to set an appt. Mailbox was full.

## 2021-02-17 ENCOUNTER — Telehealth: Payer: Self-pay | Admitting: Family

## 2021-02-17 NOTE — Telephone Encounter (Signed)
Called and left a message for pt to set an follow up appt with PA Erin.

## 2021-02-21 ENCOUNTER — Ambulatory Visit (INDEPENDENT_AMBULATORY_CARE_PROVIDER_SITE_OTHER): Payer: 59 | Admitting: Family

## 2021-02-21 ENCOUNTER — Other Ambulatory Visit: Payer: Self-pay

## 2021-02-21 ENCOUNTER — Other Ambulatory Visit (HOSPITAL_BASED_OUTPATIENT_CLINIC_OR_DEPARTMENT_OTHER): Payer: Self-pay

## 2021-02-21 ENCOUNTER — Encounter: Payer: Self-pay | Admitting: Family

## 2021-02-21 DIAGNOSIS — M869 Osteomyelitis, unspecified: Secondary | ICD-10-CM

## 2021-02-21 MED ORDER — PENTOXIFYLLINE ER 400 MG PO TBCR
400.0000 mg | EXTENDED_RELEASE_TABLET | Freq: Three times a day (TID) | ORAL | 3 refills | Status: DC
  Filled 2021-02-21 – 2021-02-24 (×2): qty 90, 30d supply, fill #0
  Filled 2021-03-29 (×2): qty 90, 30d supply, fill #1

## 2021-02-21 MED ORDER — NITROGLYCERIN 0.2 MG/HR TD PT24
0.2000 mg | MEDICATED_PATCH | Freq: Every day | TRANSDERMAL | 1 refills | Status: DC
  Filled 2021-02-21 – 2021-02-24 (×2): qty 30, 30d supply, fill #0
  Filled 2021-03-29: qty 30, 30d supply, fill #1

## 2021-02-21 NOTE — Progress Notes (Signed)
Post-Op Visit Note   [name]           Date of Birth: 04-Nov-1960           MRN: [id number] Visit Date: 02/21/2021 PCP: Mackie Pai, PA-C  Chief Complaint:  No chief complaint on file.   HPI:  HPI The patient is a 60 year old gentleman seen third ray amputation on 01/11/21. Did have some plantar dehiscence.  He has been packing open with silver cell using a kneeling scooter for mobility He feels there is marked improvement since last viewing. Ortho Exam On examination of the left foot the dorsum of his third ray amputation is well-healed from the webspace.  Some dry peeling skin over the dorsum and plantar aspect of his foot resolving cellulitis.  There is minimal edema no erythema.  Dehisced area in the webspace extending to the plantar aspect is much improved this is healing out is now only 4 mm deep there is no probing no drainage no surrounding maceration nonviable callus tissue was debrided with a 10 blade knife he will continue packing this open with silver cell   Visit Diagnoses:  No diagnosis found.   Plan: He will begin packing open with silver cell.  After cleaning the wound daily.  Continue nonweightbearing.  Follow-up in the office in 2 weeks  Follow-Up Instructions: No follow-ups on file.   Imaging: No results found.  Orders:  No orders of the defined types were placed in this encounter.  No orders of the defined types were placed in this encounter.    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) 06/22/2018   Coronary artery disease involving native coronary artery with unstable angina pectoris ([place]) 06/22/2018   Acute myocardial infarction ([place]) 06/22/2018   Hyperlipidemia    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   Vertigo 08/20/2016   Myringotomy tube status 08/20/2016   Ear fullness, left 08/20/2016   Asymmetrical left sensorineural hearing loss 02/06/2016   Neuropathy of left foot 02/03/2015   Charcot's joint of left foot, non-diabetic 02/03/2015   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   Cramping of hands 06/02/2014   Eustachian tube dysfunction 06/02/2014   HTN (hypertension) 05/07/2014   Obstructive sleep apnea 05/07/2014   Past Medical History:  Diagnosis Date   Acute myocardial infarction ([place]) 06/22/2018   Allergy    Charcot's joint of left foot, non-diabetic    Coronary artery disease involving native coronary artery with unstable angina pectoris ([place]) 06/22/2018   Diabetes ([place])    type 2   GERD (gastroesophageal reflux disease)    History of kidney stones    passed   Hyperlipidemia    Hypertension    Neuropathy of left foot    Pneumonia    As a child   Right foot ulcer ([place])    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   LIMA to LAD, SVG to Diag, SVG to OM2, SVG to [name], EVH via right thigh and  leg   Type II diabetes mellitus with complication, uncontrolled ([name]    Uncontrolled type 2 diabetes mellitus ([place])     Family History  Problem Relation Age of Onset   Diabetes Mother    Stroke Father    Hypertension Father    Hearing loss Father    Colon polyps Father    Colon cancer Neg Hx    Esophageal cancer Neg Hx    Prostate cancer Neg Hx     Past Surgical History:  Procedure Laterality Date   AMPUTATION Right 01/06/2020   Procedure: RIGHT FOOT 2ND RAY AMPUTATION;  Surgeon: Newt Minion, MD;  Location: [facility];  Service: Orthopedics;  Laterality: Right;   AMPUTATION Left 01/11/2021   Procedure: LEFT FOOT 3RD RAY AMPUTATION;  Surgeon: Newt Minion, MD;  Location: [facility];  Service: Orthopedics;  Laterality: Left;   COLONOSCOPY W/ POLYPECTOMY     CORONARY ARTERY BYPASS GRAFT N/A 06/25/2018   Procedure: CORONARY ARTERY BYPASS GRAFTING (CABG) TIMES FOUR USING LEFT INTERNAL MAMMARY ARTERY AND RIGHT ENDOSCOPICALLY HARVESTED SAPHENOUS VEIN;  Surgeon: Rexene Alberts, MD;  Location: [facility];  Service: Open Heart Surgery;  Laterality: N/A;   ENDOVEIN HARVEST OF GREATER SAPHENOUS VEIN Right 06/25/2018   Procedure: ENDOVEIN HARVEST OF GREATER SAPHENOUS VEIN;  Surgeon: Rexene Alberts, MD;  Location: [facility];  Service: Open Heart Surgery;  Laterality: Right;   LEFT HEART CATH AND CORONARY ANGIOGRAPHY N/A 06/22/2018   Procedure: LEFT HEART CATH AND CORONARY ANGIOGRAPHY;  Surgeon: Nigel Mormon, MD;  Location: [facility];  Service: Cardiovascular;  Laterality: N/A;   surgical debridement  Right    Right foot ulcer   TEE WITHOUT CARDIOVERSION N/A 06/25/2018   Procedure: TRANSESOPHAGEAL ECHOCARDIOGRAM (TEE);  Surgeon: Rexene Alberts, MD;  Location: [facility];  Service: Open Heart Surgery;  Laterality: N/A;   Social History   Occupational History   Occupation: attorney  Tobacco Use   Smoking status: Never   Smokeless tobacco: Never  Vaping Use   Vaping Use: Never used  Substance and Sexual Activity   Alcohol use: Yes    Alcohol/week: 2.0 standard drinks    Types: 2 Cans of beer per week    Comment: 2 cans of beer or 2 borbon   Drug use: Never   Sexual activity: Not on file

## 2021-02-21 NOTE — Addendum Note (Signed)
Addended by: Suzan Slick on: 02/21/2021 09:12 AM   Modules accepted: Orders

## 2021-02-22 ENCOUNTER — Other Ambulatory Visit (HOSPITAL_BASED_OUTPATIENT_CLINIC_OR_DEPARTMENT_OTHER): Payer: Self-pay

## 2021-02-24 ENCOUNTER — Other Ambulatory Visit: Payer: Self-pay | Admitting: Cardiovascular Disease

## 2021-02-24 ENCOUNTER — Other Ambulatory Visit (HOSPITAL_BASED_OUTPATIENT_CLINIC_OR_DEPARTMENT_OTHER): Payer: Self-pay

## 2021-02-27 ENCOUNTER — Other Ambulatory Visit (HOSPITAL_BASED_OUTPATIENT_CLINIC_OR_DEPARTMENT_OTHER): Payer: Self-pay

## 2021-02-28 ENCOUNTER — Other Ambulatory Visit (HOSPITAL_BASED_OUTPATIENT_CLINIC_OR_DEPARTMENT_OTHER): Payer: Self-pay

## 2021-02-28 ENCOUNTER — Other Ambulatory Visit: Payer: Self-pay | Admitting: Medical

## 2021-02-28 MED ORDER — HYDROCHLOROTHIAZIDE 12.5 MG PO CAPS
ORAL_CAPSULE | Freq: Every day | ORAL | 1 refills | Status: DC
  Filled 2021-02-28 – 2021-03-10 (×2): qty 90, 90d supply, fill #0
  Filled 2021-05-29: qty 90, 90d supply, fill #1

## 2021-02-28 MED FILL — Clopidogrel Bisulfate Tab 75 MG (Base Equiv): ORAL | 90 days supply | Qty: 90 | Fill #1 | Status: CN

## 2021-02-28 MED FILL — Metoprolol Tartrate Tab 25 MG: ORAL | 90 days supply | Qty: 180 | Fill #1 | Status: CN

## 2021-03-08 ENCOUNTER — Ambulatory Visit (INDEPENDENT_AMBULATORY_CARE_PROVIDER_SITE_OTHER): Payer: 59 | Admitting: Physician Assistant

## 2021-03-08 ENCOUNTER — Encounter: Payer: Self-pay | Admitting: Physician Assistant

## 2021-03-08 ENCOUNTER — Other Ambulatory Visit: Payer: Self-pay

## 2021-03-08 DIAGNOSIS — M86271 Subacute osteomyelitis, right ankle and foot: Secondary | ICD-10-CM

## 2021-03-08 NOTE — Progress Notes (Signed)
Office Visit Note   [name]           Date of Birth: 09-Jan-1961           MRN: [id number] Visit Date: 03/08/2021              Requested by: Mackie Pai, PA-C [place],  [place] [address] PCP: Mackie Pai, PA-C  Chief Complaint  Patient presents with   Left Foot - Routine Post Op    06/29/21 left total knee replacement       HPI: This pleasant gentleman who is 2 months status post left foot third ray amputation.  He has been slow to heal.  He has been using a nitroglycerin patch and Trental.  He is pleased and is seeing significant improvement  Assessment & Plan: Visit Diagnoses: No diagnosis found.  Plan: Patient will continue to use Darco shoe for another 3 weeks.  Follow-up at that time.  I would anticipate that will be his final visit  Follow-Up Instructions: No follow-ups on file.   Ortho Exam  Patient is alert, oriented, no adenopathy, well-dressed, normal affect, normal respiratory effort. Examination no signs of infection no drainage wound in the webspace is significantly improved with healthy tissue at the base 100%.  Does not probe deeply does not tunnel.  No signs of ascending cellulitis  Imaging: No results found. No images are attached to the encounter.  Labs: Lab Results  Component Value Date   HGBA1C 7.4 (A) 01/27/2021   HGBA1C 6.9 (H) 04/15/2020   HGBA1C 7.8 (H) 05/08/2019   REPTSTATUS 01/16/2021 FINAL 01/11/2021   CULT  01/11/2021    RARE METHICILLIN RESISTANT STAPHYLOCOCCUS AUREUS NO ANAEROBES ISOLATED Performed at [facility], [place] [address], [place], [place] [address]    LABORGA METHICILLIN RESISTANT STAPHYLOCOCCUS AUREUS 01/11/2021     Lab Results  Component Value Date   ALBUMIN 3.9 05/08/2019   ALBUMIN 2.7 (L) 06/23/2018   ALBUMIN 4.4 01/17/2018    Lab Results  Component Value Date   MG 2.1 05/08/2019   MG 2.2 06/26/2018   MG 3.0 (H) 06/26/2018   No results found for:  VD25OH  No results found for: PREALBUMIN CBC EXTENDED Latest Ref Rng & Units 01/11/2021 04/15/2020 01/06/2020  WBC 4.0 - 10.5 K/uL 13.3(H) 7.3 8.9  RBC 4.22 - 5.81 MIL/uL 4.62 4.42 4.21(L)  HGB 13.0 - 17.0 g/dL 14.2 13.7 12.6(L)  HCT 39.0 - 52.0 % 42.0 40.2 39.0  PLT 150 - 400 K/uL 330 261 411(H)  NEUTROABS 1,500 - 7,800 cells/uL - 4,314 -  LYMPHSABS 850 - 3,900 cells/uL - 2,102 -     There is no height or weight on file to calculate BMI.  Orders:  No orders of the defined types were placed in this encounter.  No orders of the defined types were placed in this encounter.    Procedures: No procedures performed  Clinical Data: No additional findings.  ROS:  All other systems negative, except as noted in the HPI. Review of Systems  Objective: Vital Signs: There were no vitals taken for this visit.  Specialty Comments:  No specialty comments available.  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 ([place]) 01/18/2021   Osteomyelitis of third toe of left foot ([name]    Cutaneous abscess of left foot    Osteomyelitis of second toe  of right foot ([place])    Poorly controlled type 2 diabetes mellitus with circulatory disorder ([place]) 07/15/2018   Numbness in both hands 07/15/2018   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   Acute coronary syndrome ([place]) 06/22/2018   Nonhealing skin ulcer ([place]) 06/22/2018   Coronary artery disease involving native coronary artery with unstable angina pectoris ([place]) 06/22/2018   Acute myocardial infarction ([place]) 06/22/2018   Hyperlipidemia    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   Vertigo 08/20/2016   Myringotomy tube status 08/20/2016   Ear fullness, left 08/20/2016   Asymmetrical left sensorineural hearing loss 02/06/2016   Neuropathy of left foot 02/03/2015   Charcot's joint of left foot, non-diabetic 02/03/2015   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   Cramping  of hands 06/02/2014   Eustachian tube dysfunction 06/02/2014   HTN (hypertension) 05/07/2014   Obstructive sleep apnea 05/07/2014   Past Medical History:  Diagnosis Date   Acute myocardial infarction ([place]) 06/22/2018   Allergy    Charcot's joint of left foot, non-diabetic    Coronary artery disease involving native coronary artery with unstable angina pectoris ([place]) 06/22/2018   Diabetes ([place])    type 2   GERD (gastroesophageal reflux disease)    History of kidney stones    passed   Hyperlipidemia    Hypertension    Neuropathy of left foot    Pneumonia    As a child   Right foot ulcer ([place])    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   LIMA to LAD, SVG to Diag, SVG to OM2, SVG to [name], EVH via right thigh and leg   Type II diabetes mellitus with complication, uncontrolled ([place])    Uncontrolled type 2 diabetes mellitus ([place])     Family History  Problem Relation Age of Onset   Diabetes Mother    Stroke Father    Hypertension Father    Hearing loss Father    Colon polyps Father    Colon cancer Neg Hx    Esophageal cancer Neg Hx    Prostate cancer Neg Hx     Past Surgical History:  Procedure Laterality Date   AMPUTATION Right 01/06/2020   Procedure: RIGHT FOOT 2ND RAY AMPUTATION;  Surgeon: Newt Minion, MD;  Location: [place];  Service: Orthopedics;  Laterality: Right;   AMPUTATION Left 01/11/2021   Procedure: LEFT FOOT 3RD RAY AMPUTATION;  Surgeon: Newt Minion, MD;  Location: [place];  Service: Orthopedics;  Laterality: Left;   COLONOSCOPY W/ POLYPECTOMY     CORONARY ARTERY BYPASS GRAFT N/A 06/25/2018   Procedure: CORONARY ARTERY BYPASS GRAFTING (CABG) TIMES FOUR USING LEFT INTERNAL MAMMARY ARTERY AND RIGHT ENDOSCOPICALLY HARVESTED SAPHENOUS VEIN;  Surgeon: Rexene Alberts, MD;  Location: [place];  Service: Open Heart Surgery;  Laterality: N/A;   ENDOVEIN HARVEST OF GREATER SAPHENOUS VEIN Right 06/25/2018   Procedure: ENDOVEIN HARVEST OF GREATER SAPHENOUS VEIN;  Surgeon: Rexene Alberts, MD;   Location: [place];  Service: Open Heart Surgery;  Laterality: Right;   LEFT HEART CATH AND CORONARY ANGIOGRAPHY N/A 06/22/2018   Procedure: LEFT HEART CATH AND CORONARY ANGIOGRAPHY;  Surgeon: Nigel Mormon, MD;  Location: [place] [facility];  Service: Cardiovascular;  Laterality: N/A;   surgical debridement  Right    Right foot ulcer   TEE WITHOUT CARDIOVERSION N/A 06/25/2018   Procedure: TRANSESOPHAGEAL ECHOCARDIOGRAM (TEE);  Surgeon: Rexene Alberts, MD;  Location: [place];  Service: Open Heart Surgery;  Laterality: N/A;  Social History   Occupational History   Occupation: attorney  Tobacco Use   Smoking status: Never   Smokeless tobacco: Never  Vaping Use   Vaping Use: Never used  Substance and Sexual Activity   Alcohol use: Yes    Alcohol/week: 2.0 standard drinks    Types: 2 Cans of beer per week    Comment: 2 cans of beer or 2 borbon   Drug use: Never   Sexual activity: Not on file

## 2021-03-09 ENCOUNTER — Other Ambulatory Visit (HOSPITAL_BASED_OUTPATIENT_CLINIC_OR_DEPARTMENT_OTHER): Payer: Self-pay

## 2021-03-10 ENCOUNTER — Other Ambulatory Visit: Payer: Self-pay | Admitting: Medical

## 2021-03-10 ENCOUNTER — Other Ambulatory Visit (HOSPITAL_BASED_OUTPATIENT_CLINIC_OR_DEPARTMENT_OTHER): Payer: Self-pay

## 2021-03-10 MED ORDER — LISINOPRIL 10 MG PO TABS
ORAL_TABLET | Freq: Every day | ORAL | 0 refills | Status: DC
  Filled 2021-03-10: qty 90, 90d supply, fill #0

## 2021-03-10 MED FILL — Metoprolol Tartrate Tab 25 MG: ORAL | 90 days supply | Qty: 180 | Fill #1 | Status: AC

## 2021-03-10 MED FILL — Clopidogrel Bisulfate Tab 75 MG (Base Equiv): ORAL | 90 days supply | Qty: 90 | Fill #1 | Status: AC

## 2021-03-14 DIAGNOSIS — H52203 Unspecified astigmatism, bilateral: Secondary | ICD-10-CM | POA: Diagnosis not present

## 2021-03-14 DIAGNOSIS — E119 Type 2 diabetes mellitus without complications: Secondary | ICD-10-CM | POA: Diagnosis not present

## 2021-03-14 DIAGNOSIS — H35033 Hypertensive retinopathy, bilateral: Secondary | ICD-10-CM | POA: Diagnosis not present

## 2021-03-14 DIAGNOSIS — Z7984 Long term (current) use of oral hypoglycemic drugs: Secondary | ICD-10-CM | POA: Diagnosis not present

## 2021-03-14 DIAGNOSIS — H5213 Myopia, bilateral: Secondary | ICD-10-CM | POA: Diagnosis not present

## 2021-03-14 DIAGNOSIS — H524 Presbyopia: Secondary | ICD-10-CM | POA: Diagnosis not present

## 2021-03-14 DIAGNOSIS — E113291 Type 2 diabetes mellitus with mild nonproliferative diabetic retinopathy without macular edema, right eye: Secondary | ICD-10-CM | POA: Diagnosis not present

## 2021-03-16 NOTE — Progress Notes (Signed)
Cardiology Office Note   Date:  03/17/2021   ID:  Joshua Branch, DOB [birth year], MRN [id number]  PCP:  Mackie Pai, PA-C  Cardiologist:  Dr.Berry  CC: Follow Up CAD   History of Present Illness: Joshua Branch is a 60 y.o. male who presents for ongoing assessment and management of coronary artery disease.  He is status post CABG x4 by Dr. Roxy Manns in 2019.  Other history includes type 2 diabetes, hypertension, hyperlipidemia with family history of coronary artery disease.  He was last seen by Dr. Gwenlyn Found on 11/21/2018 at which time he was asymptomatic with the exception of generalized fatigue.  No changes were made in his medication regimen, he was continued on lisinopril and metoprolol along with high-dose atorvastatin.  He comes today without any cardiac complaints.  He has been treated for a diabetic foot ulcer, and is being followed by Ortho care of [facility].  He is wearing a brace on his left foot for the next 12 days and then he will be able to take it off.  As result he has not been very active and has gained about 20 pounds as a result.  He apparently had had a foot ulcer on the right which was being treated as well.  He denies any chest pain, dyspnea on exertion, or worsening fatigue.  He admits that he is out of shape and been unable to exercise and be more active because of his foot ulcers and infections associated with that.  He has been medically compliant.  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   LIMA to LAD, SVG to  Diag, SVG to OM2, SVG to [name], EVH via right thigh and leg   Type II diabetes mellitus with complication, uncontrolled ([place])    Uncontrolled type 2 diabetes mellitus ([place])     Past Surgical History:  Procedure Laterality Date   AMPUTATION Right 01/06/2020   Procedure: RIGHT FOOT 2ND RAY AMPUTATION;  Surgeon: Newt Minion, MD;  Location: [place];  Service: Orthopedics;  Laterality: Right;   AMPUTATION Left 01/11/2021   Procedure: LEFT FOOT 3RD RAY AMPUTATION;  Surgeon: Newt Minion, MD;  Location: [place];  Service: Orthopedics;  Laterality: Left;   COLONOSCOPY W/ POLYPECTOMY     CORONARY ARTERY BYPASS GRAFT N/A 06/25/2018   Procedure: CORONARY ARTERY BYPASS GRAFTING (CABG) TIMES FOUR USING LEFT INTERNAL MAMMARY ARTERY AND RIGHT ENDOSCOPICALLY HARVESTED SAPHENOUS VEIN;  Surgeon: Rexene Alberts, MD;  Location: [place];  Service: Open Heart Surgery;  Laterality: N/A;   ENDOVEIN HARVEST OF GREATER SAPHENOUS VEIN Right 06/25/2018   Procedure: ENDOVEIN HARVEST OF GREATER SAPHENOUS VEIN;  Surgeon: Rexene Alberts, MD;  Location: [place];  Service: Open Heart Surgery;  Laterality: Right;   LEFT HEART CATH AND CORONARY ANGIOGRAPHY N/A 06/22/2018   Procedure: LEFT HEART CATH AND CORONARY ANGIOGRAPHY;  Surgeon: Nigel Mormon, MD;  Location: [place] [facility];  Service: Cardiovascular;  Laterality: N/A;   surgical debridement  Right    Right foot ulcer   TEE WITHOUT CARDIOVERSION N/A 06/25/2018   Procedure: TRANSESOPHAGEAL ECHOCARDIOGRAM (TEE);  Surgeon: Rexene Alberts, MD;  Location: [place];  Service: Open Heart Surgery;  Laterality: N/A;     Current Outpatient Medications  Medication Sig Dispense Refill   aspirin EC 81 MG tablet Take 81 mg by mouth daily.     atorvastatin (LIPITOR) 80 MG tablet Take 1 tablet by mouth daily. 15 tablet 0   Blood Glucose Monitoring Suppl (FREESTYLE LITE) DEVI      cetirizine (ZYRTEC) 10 MG tablet Take 1 tablet (10 mg total) by mouth daily. (Patient taking  differently: Take 10 mg by mouth daily as needed for allergies.) 30 tablet 11   clopidogrel (PLAVIX) 75 MG tablet TAKE 1 TABLET (75 MG TOTAL) BY MOUTH DAILY. (Patient taking differently: Take 75 mg by mouth daily.) 90 tablet 3   Coenzyme Q10 (CO Q-10) 300 MG CAPS Take 300 mg by mouth daily.     dapagliflozin propanediol (FARXIGA) 10 MG TABS tablet Take 1 tablet (10 mg total) by mouth daily before breakfast. 90 tablet 3   glucose blood (FREESTYLE LITE) test strip USE AS INSTRUCTED TO CHECK 1-2X A DAY 100 strip 1   hydrochlorothiazide (MICROZIDE) 12.5 MG capsule TAKE 1 CAPSULE (12.5 MG TOTAL) BY MOUTH DAILY. 90 capsule 1   Lancets MISC Check sugars 3 times a day E11.9 100 each 0   lisinopril (ZESTRIL) 10 MG tablet TAKE 1 TABLET (10 MG TOTAL) BY MOUTH DAILY. 90 tablet 0   metFORMIN (GLUCOPHAGE) 500 MG tablet Take 1 tablet (500 mg total) by mouth 2 (two) times daily with a meal. 180 tablet 3   metoprolol tartrate (LOPRESSOR) 25 MG tablet TAKE 1 TABLET (25 MG TOTAL) BY MOUTH 2 (TWO) TIMES DAILY. (Patient taking differently: Take 25 mg by mouth 2 (two) times daily.) 180 tablet 3   Multiple Vitamin (MULTIVITAMIN WITH MINERALS) TABS tablet Take 1 tablet by mouth daily.     nitroGLYCERIN (NITRODUR - DOSED IN MG/24 HR) 0.2 mg/hr patch Place 1 patch (0.2 mg total) onto the skin daily. 30 patch 1   pentoxifylline (TRENTAL) 400 MG CR tablet Take 1 tablet (400 mg total) by mouth 3 (three) times daily with meals. 90 tablet 3   sildenafil (REVATIO) 20 MG tablet TAKE 2-5 TABLETS BY MOUTH ONE HOUR BEFORE SEX AS NEEDED **MAX OF 5 TABLETS WITHIN A 24 HOUR PERIOD** (Patient taking differently: Take 20-100 mg by mouth See admin instructions. Take 1 hour prior to sex as needed. Max of '100mg'$  within a 24 hour period.) 100 tablet 0   vitamin B-12 (CYANOCOBALAMIN) 1000 MCG tablet Take 1,000 mcg by mouth daily.     No current facility-administered medications for this visit.    Allergies:   Apricot kernel oil [prunus],  Cherry, Jardiance [empagliflozin], Other, Peach [prunus persica], and Plum pulp    Social History:  The patient  reports that he has never smoked. He has never used smokeless tobacco. He reports current alcohol use of about 2.0 standard drinks per week. He reports that he does not use drugs.   Family History:  [name] family history includes Colon polyps in his father; Diabetes in his mother; Hearing loss in his father; Hypertension in his father; Stroke in his father.    ROS: All other systems are reviewed and negative. Unless otherwise mentioned in H&P    PHYSICAL EXAM: VS:  BP 115/82   Pulse 88   Ht 6' 2.5" (1.892 m)   Wt 243 lb 3.2 oz (110.3 kg)   SpO2 93%   BMI 30.81 kg/m  , BMI Body mass index is 30.81 kg/m. GEN: Well nourished, well developed, in no acute distress HEENT: normal Neck: no JVD, carotid bruits, or masses Cardiac: RRR; 1/6 systolic murmur murmurs, rubs, or gallops,no edema  Respiratory:  Clear to auscultation bilaterally, normal work of breathing GI: soft, nontender, nondistended, + BS, central obesity noted MS: no deformity or atrophy, wearing foot brace on the left. Skin: warm and dry, no rash Neuro:  Strength and sensation are intact Psych: euthymic mood, full affect   EKG:  EKG is not ordered today. The ekg ordered today demonstrates (personally reviewed) normal sinus rhythm, ventricular rate of 88 bpm with left axis deviation, prior anterolateral infarct.   Recent Labs: 04/15/2020: ALT 12 01/11/2021: BUN 27; Creatinine, Ser 1.31; Hemoglobin 14.2; Platelets 330; Potassium 4.3; Sodium 132    Lipid Panel    Component Value Date/Time   CHOL 114 04/15/2020 0847   TRIG 103 04/15/2020 0847   HDL 36 (L) 04/15/2020 0847   CHOLHDL 3.2 04/15/2020 0847   VLDL 26.2 05/08/2019 0931   LDLCALC 59 04/15/2020 0847   LDLDIRECT 93.0 01/17/2018 0956      Wt Readings from Last 3 Encounters:  03/17/21 243 lb 3.2 oz (110.3 kg)  01/27/21 227 lb (103 kg)   01/11/21 227 lb 1.2 oz (103 kg)      Other studies Reviewed: 06/22/2018 Left Heart Cath  Ost 1st Diag to 1st Diag lesion is 70% stenosed. 1st Diag lesion is 80% stenosed.   LM: Normal LAD: Prox ectatic areas with 40% stenoses          Mid to distal LAD is likely the culprit vessel. Mid 100% occlusion, with          recanalization.Distal apical LAD is completely occluded and receives faint collaterals from septal perforators, and distal RPDA. TIMI 1 flow in distal LAD, TIMI 0 flow in distal apical LAD          Medium sized Diag branch with mid 70% and distal 80% stenoses.  LCx: Large OM 1 with proxiaml 50% and mid to distal 80% stenosis         Medium sized OM2 completely occluded with collaterals from OM1.  RCA: Dominant. Prox 40%, mid 80%, and ostial RPDA 95% stenoses. RPDA gives faint collateral to apical distal    LVEF 50-55% with apical hypokinesis.    Recommendation: If patient could be a candidate for LIMA-LAD bypass graft, he may best benefit from surgical multivessel revascularization. However, if LAD is deemed not to have a surgical target, it may be best to attempt revascularization to the LAD percutaneously, in addition to revascularization to OM2 and RCA. Continue aspirin/heparin. Recommend metoprolol, lipitor. Hold P2Y12 inhibitor for now.    Echocardiogram 06/22/2018 Left ventricle: The cavity size was normal. Wall thickness was    normal. Systolic function was normal. The estimated ejection    fraction was in the range of 50% to 55%. Mild hypokinesis of the    apical myocardium. No thrombus seen on contrast study. Left    ventricular diastolic function parameters were normal.  - Aortic valve: Mildly calcified annulus. Mildly thickened    leaflets. No significant stenosis or regurgitation.  - Mitral valve: Mildly calcified annulus. No significant stenosis    or regurgitation.   ASSESSMENT AND PLAN:  1.  Coronary artery disease: Status post CABG x4 by Dr. Roxy Manns in  2019.  He will be due for a stress test in 2 more years for ongoing surveillance.  At this time he is without any cardiac complaints and medically compliant.  Secondary prevention is to be continued.  2.  Hyperlipidemia: Goal of LDL less than 70 with CAD.  Labs are being drawn by his PCP and copies will be sent to [place].  We have had a discussion on the need to keep his LDL below 70.  He will continue atorvastatin 80 mg daily.  If for any reason his LDL is less than 50 we may need to drop back on the dose.  For now I am going to refill his atorvastatin 80 mg, 90-day supply.  3.  Hypertension: Blood pressure is very well controlled and within range for diabetic patient.  He will continue lisinopril and metoprolol as directed.  We will review labs once they are available for kidney function.  4.  Diabetic foot ulcer on the left.  Being followed by orthopedic physician.  He is anxious to become more active and has 12 more days concerning his foot brace.  He would like to be more active again.  I have encouraged this.   Current medicines are reviewed at length with the patient today.  I have spent 25 minutes dedicated to the care of this patient on the date of this encounter to include pre-visit review of records, assessment, management and diagnostic testing,with shared decision making.  Labs/ tests ordered today include: None-requesting labs from PCP when they are completed in the near future. Phill Myron. [place] Pugh, ANP, AACC   03/17/2021 4:13 PM    [facility] [address] [place] [address] Office [phone] Fax [phone]  Notice: This dictation was prepared with Dragon dictation along with smaller phrase technology. Any transcriptional errors that result from this process are unintentional and may not be corrected upon review.

## 2021-03-17 ENCOUNTER — Other Ambulatory Visit: Payer: Self-pay

## 2021-03-17 ENCOUNTER — Ambulatory Visit (INDEPENDENT_AMBULATORY_CARE_PROVIDER_SITE_OTHER): Payer: 59 | Admitting: Adult Health

## 2021-03-17 ENCOUNTER — Other Ambulatory Visit (HOSPITAL_BASED_OUTPATIENT_CLINIC_OR_DEPARTMENT_OTHER): Payer: Self-pay

## 2021-03-17 ENCOUNTER — Encounter: Payer: Self-pay | Admitting: Adult Health

## 2021-03-17 VITALS — BP 115/82 | HR 88 | Ht 74.5 in | Wt 243.2 lb

## 2021-03-17 DIAGNOSIS — E7800 Pure hypercholesterolemia, unspecified: Secondary | ICD-10-CM | POA: Diagnosis not present

## 2021-03-17 DIAGNOSIS — I2510 Atherosclerotic heart disease of native coronary artery without angina pectoris: Secondary | ICD-10-CM

## 2021-03-17 DIAGNOSIS — M869 Osteomyelitis, unspecified: Secondary | ICD-10-CM

## 2021-03-17 DIAGNOSIS — Z951 Presence of aortocoronary bypass graft: Secondary | ICD-10-CM

## 2021-03-17 DIAGNOSIS — I10 Essential (primary) hypertension: Secondary | ICD-10-CM

## 2021-03-17 MED ORDER — ATORVASTATIN CALCIUM 80 MG PO TABS
ORAL_TABLET | Freq: Every day | ORAL | 0 refills | Status: DC
  Filled 2021-03-17: qty 15, 15d supply, fill #0

## 2021-03-17 NOTE — Patient Instructions (Signed)
Medication Instructions:  No changes *If you need a refill on your cardiac medications before your next appointment, please call your pharmacy*   Lab Work: No Labs If you have labs (blood work) drawn today and your tests are completely normal, you will receive your results only by: [place] (if you have MyChart) OR A paper copy in the mail If you have any lab test that is abnormal or we need to change your treatment, we will call you to review the results.   Testing/Procedures: No Testing   Follow-Up: At [facility], you and your health needs are our priority.  As part of our continuing mission to provide you with exceptional heart care, we have created designated Provider Care Teams.  These Care Teams include your primary Cardiologist (physician) and Advanced Practice Providers (APPs -  Physician Assistants and Nurse Practitioners) who all work together to provide you with the care you need, when you need it.  We recommend signing up for the patient portal called "MyChart".  Sign up information is provided on this After Visit Summary.  MyChart is used to connect with patients for Virtual Visits (Telemedicine).  Patients are able to view lab/test results, encounter notes, upcoming appointments, etc.  Non-urgent messages can be sent to your provider as well.   To learn more about what you can do with MyChart, go to [email or web address].    Your next appointment:   1 year(s)  The format for your next appointment:   In Person  Provider:   Quay Burow, MD   Other Instructions Please Fax Blood Test Results from PCP [phone]

## 2021-03-20 ENCOUNTER — Other Ambulatory Visit: Payer: Self-pay

## 2021-03-20 ENCOUNTER — Ambulatory Visit (INDEPENDENT_AMBULATORY_CARE_PROVIDER_SITE_OTHER): Payer: 59 | Admitting: Dermatology

## 2021-03-20 ENCOUNTER — Other Ambulatory Visit (HOSPITAL_BASED_OUTPATIENT_CLINIC_OR_DEPARTMENT_OTHER): Payer: Self-pay

## 2021-03-20 ENCOUNTER — Encounter: Payer: Self-pay | Admitting: Dermatology

## 2021-03-20 DIAGNOSIS — C4441 Basal cell carcinoma of skin of scalp and neck: Secondary | ICD-10-CM

## 2021-03-20 DIAGNOSIS — D485 Neoplasm of uncertain behavior of skin: Secondary | ICD-10-CM

## 2021-03-20 DIAGNOSIS — Z1283 Encounter for screening for malignant neoplasm of skin: Secondary | ICD-10-CM | POA: Diagnosis not present

## 2021-03-20 MED ORDER — ATORVASTATIN CALCIUM 80 MG PO TABS
80.0000 mg | ORAL_TABLET | Freq: Every day | ORAL | 3 refills | Status: DC
  Filled 2021-03-20 – 2021-03-29 (×2): qty 90, 90d supply, fill #0
  Filled 2021-06-12 – 2021-07-05 (×2): qty 90, 90d supply, fill #1
  Filled 2021-10-03: qty 90, 90d supply, fill #2
  Filled 2021-12-27: qty 90, 90d supply, fill #3

## 2021-03-20 NOTE — Patient Instructions (Signed)

## 2021-03-22 ENCOUNTER — Telehealth: Payer: Self-pay

## 2021-03-22 ENCOUNTER — Ambulatory Visit (INDEPENDENT_AMBULATORY_CARE_PROVIDER_SITE_OTHER): Payer: 59 | Admitting: Gastroenterology

## 2021-03-22 ENCOUNTER — Other Ambulatory Visit (HOSPITAL_BASED_OUTPATIENT_CLINIC_OR_DEPARTMENT_OTHER): Payer: Self-pay

## 2021-03-22 ENCOUNTER — Encounter: Payer: Self-pay | Admitting: Gastroenterology

## 2021-03-22 ENCOUNTER — Other Ambulatory Visit: Payer: Self-pay

## 2021-03-22 VITALS — BP 112/82 | HR 90 | Ht 74.5 in | Wt 240.0 lb

## 2021-03-22 DIAGNOSIS — Z86010 Personal history of colonic polyps: Secondary | ICD-10-CM | POA: Diagnosis not present

## 2021-03-22 DIAGNOSIS — Z951 Presence of aortocoronary bypass graft: Secondary | ICD-10-CM | POA: Diagnosis not present

## 2021-03-22 DIAGNOSIS — K219 Gastro-esophageal reflux disease without esophagitis: Secondary | ICD-10-CM

## 2021-03-22 DIAGNOSIS — Z7189 Other specified counseling: Secondary | ICD-10-CM

## 2021-03-22 MED ORDER — CLENPIQ 10-3.5-12 MG-GM -GM/160ML PO SOLN
1.0000 | Freq: Once | ORAL | 0 refills | Status: AC
  Filled 2021-03-22: qty 320, 1d supply, fill #0

## 2021-03-22 NOTE — Telephone Encounter (Signed)
Patient made aware to hold Plavix 5 day before procedure and he expressed understanding

## 2021-03-22 NOTE — Telephone Encounter (Signed)
    Patient Name: Joshua Branch  DOB: Sep 18, 1960 MRN: [id number]  Primary Cardiologist: Dr. Gwenlyn Found, MD   Chart reviewed as part of pre-operative protocol coverage. Given past medical history and time since last visit, based on ACC/AHA guidelines, GERRAD POST would be at acceptable risk for the planned procedure without further cardiovascular testing.   The patient was seen in routine follow up 03/17/21 at which time he was doing well from a CV standpoint with no anginal symptoms. No changes were made at that time. The patient may hold Plavix 3-5 days prior to procedure and resume as soon as possible per procedural team thereafter.   The patient was advised that if he develops new symptoms prior to surgery to contact our office to arrange for a follow-up visit, and he verbalized understanding.  I will route this recommendation to the requesting party via Epic fax function and remove from pre-op pool.  Please call with questions.  Kathyrn Drown, NP 03/22/2021, 9:22 AM

## 2021-03-22 NOTE — Progress Notes (Signed)
Chief Complaint:    History of colon polyps, discuss colonoscopy  Endoscopic History: - EGD (03/2018): LA Grade A esophagitis, Hill grade 2, empiric 52 French Maloney dilation, antral gastritis with erosions.  Treated with high-dose PPI - Colonoscopy (03/2018): 15 mm ascending polyp, 3 polyps in rectum/transverse, 8 mm polyp in sigmoid; all tubular adenomas.  Sigmoid diverticulosis.  Normal TI.  Repeat in 3 years.  HPI:     Patient is a 60 y.o. male 60 year old male with a history of HTN, HLD, CAD with 4V CABG 2019, diabetes, BCC presenting to the [facility]ology [facility] for follow-up.  Initially seen 03/17/2018 for initial CRC screening colonoscopy, completed in 03/2018 and notable for 5 tubular adenomas, including a 15 mm pedunculated TA in the ascending colon removed with hot snare with recommendation repeat in 3 years.  At that time, also had solid food dysphagia and intermittent reflux symptoms.  EGD with mild erosive esophagitis, but no esophageal stricture.  Empiric esophageal dilation performed.  Symptoms have since resolved.  Now only rare breakthrough heartburn with dietary indiscretions, which responds to on-demand H2RA.   Today, he presents today to clinic to discuss repeat colonoscopy for ongoing polyp surveillance.  Otherwise no active GI symptoms.  Takes ASA 81 mg and Plavix daily since CABG in 05/2018.   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     atorvastatin (LIPITOR) 80 MG tablet Take 1 tablet (80 mg total) by mouth daily. 90 tablet 3   Blood Glucose Monitoring Suppl (FREESTYLE LITE) DEVI      cetirizine (ZYRTEC) 10 MG tablet Take 1 tablet (10 mg total) by mouth daily. (Patient taking differently: Take 10 mg by mouth daily as needed for allergies.) 30 tablet 11   clopidogrel (PLAVIX) 75 MG tablet TAKE 1 TABLET (75 MG TOTAL) BY MOUTH DAILY. (Patient taking differently: Take 75 mg by mouth daily.) 90 tablet 3   Coenzyme Q10 (CO Q-10) 300 MG CAPS Take 300 mg by mouth daily.     dapagliflozin propanediol (FARXIGA) 10 MG TABS tablet Take 1 tablet (10 mg total) by mouth daily before breakfast. 90 tablet 3   glucose blood (FREESTYLE LITE) test strip USE AS INSTRUCTED TO CHECK 1-2X A DAY 100 strip 1   hydrochlorothiazide (MICROZIDE) 12.5 MG capsule TAKE 1  CAPSULE (12.5 MG TOTAL) BY MOUTH DAILY. 90 capsule 1   Lancets MISC Check sugars 3 times a day E11.9 100 each 0   lisinopril (ZESTRIL) 10 MG tablet TAKE 1 TABLET (10 MG TOTAL) BY MOUTH DAILY. 90 tablet 0   metFORMIN (GLUCOPHAGE) 500 MG tablet Take 1 tablet (500 mg total) by mouth 2 (two) times daily with a meal. 180 tablet 3   metoprolol tartrate (LOPRESSOR) 25 MG tablet TAKE 1 TABLET (25 MG TOTAL) BY MOUTH 2 (TWO) TIMES DAILY. (Patient taking differently: Take 25 mg by mouth 2 (two) times daily.) 180 tablet 3   Multiple Vitamin (MULTIVITAMIN WITH MINERALS) TABS tablet Take 1 tablet by mouth daily.     nitroGLYCERIN (NITRODUR - DOSED  IN MG/24 HR) 0.2 mg/hr patch Place 1 patch (0.2 mg total) onto the skin daily. 30 patch 1   pentoxifylline (TRENTAL) 400 MG CR tablet Take 1 tablet (400 mg total) by mouth 3 (three) times daily with meals. 90 tablet 3   sildenafil (REVATIO) 20 MG tablet TAKE 2-5 TABLETS BY MOUTH ONE HOUR BEFORE SEX AS NEEDED **MAX OF 5 TABLETS WITHIN A 24 HOUR PERIOD** (Patient taking differently: Take 20-100 mg by mouth See admin instructions. Take 1 hour prior to sex as needed. Max of '100mg'$  within a 24 hour period.) 100 tablet 0   vitamin B-12 (CYANOCOBALAMIN) 1000 MCG tablet Take 1,000 mcg by mouth daily.     No current facility-administered medications for this visit.    Physical Exam:     BP 112/82   Pulse 90   Ht 6' 2.5" (1.892 m)   Wt 240 lb (108.9 kg)   SpO2 97%   BMI 30.40 kg/m   GENERAL:  Pleasant male in NAD PSYCH: : Cooperative, normal affect CARDIAC:  RRR, no murmur heard, no peripheral edema PULM: Normal respiratory effort, lungs CTA bilaterally, no wheezing ABDOMEN:  Nondistended, soft, nontender. No obvious masses, no hepatomegaly,  normal bowel sounds SKIN:  turgor, no lesions seen Musculoskeletal:  Normal muscle tone, normal strength NEURO: Alert and oriented x 3, no focal neurologic deficits   IMPRESSION and PLAN:    1) History of colon polyps - Due for repeat colonoscopy for ongoing polyp surveillance - Schedule colonoscopy  2) GERD - Well-controlled with dietary modifications - Responsive to on-demand H2 blockers - No change in medical management at this time  3) History of CAD 4) History of CABG 5) Chronic antiplatelet therapy - Had follow-up in the Cardiology Clinic on 03/17/2021 and doing well - Hold Plavix 5 days before procedure - will instruct when and how to resume after procedure. Low but real risk of cardiovascular event such as heart attack, stroke, embolism, thrombosis or ischemia/infarct of other organs off Plavix explained and need to seek urgent help if  this occurs. The patient consents to proceed. Will communicate by phone or EMR with patient's prescribing provider to confirm that holding Plavix is reasonable in this case - Okay to continue aspirin in the perioperative period  The indications, risks, and benefits of colonoscopy were explained to the patient in detail. Risks include but are not limited to bleeding, perforation, adverse reaction to medications, [name], hospitalization, [name], referred to the scheduler and bowel prep ordered. Further recommendations pending results of the exam.            Lavena Bullion ,  DO, FACG 03/22/2021, 8:30 AM

## 2021-03-22 NOTE — Telephone Encounter (Signed)
[facility] Pre-operative Risk Assessment     Request for surgical clearance:     Endoscopy Procedure  What type of surgery is being performed?     Colonoscopy  When is this surgery scheduled?     04-14-2021  What type of clearance is required ?   Pharmacy  Are there any medications that need to be held prior to surgery and how long? Plavix 5 day hold  Practice name and name of physician performing surgery?      [facility]  What is your office phone and fax number?      Phone- [phone]  [phone]  Anesthesia type (None, local, MAC, general) ?       MAC

## 2021-03-22 NOTE — Patient Instructions (Signed)
If you are age 58 or older, your body mass index should be between 23-30. Your Body mass index is 30.4 kg/m. If this is out of the aforementioned range listed, please consider follow up with your Primary Care Provider.  If you are age 53 or younger, your body mass index should be between 19-25. Your Body mass index is 30.4 kg/m. If this is out of the aformentioned range listed, please consider follow up with your Primary Care Provider.   __________________________________________________________  The [facility] providers would like to encourage you to use [facility] to communicate with providers for non-urgent requests or questions.  Due to long hold times on the telephone, sending your provider a message by [facility] may be a faster and more efficient way to get a response.  Please allow 48 business hours for a response.  Please remember that this is for non-urgent requests.   You have been scheduled for a colonoscopy. Please follow written instructions given to you at your visit today.  Please pick up your prep supplies at the pharmacy within the next 1-3 days. If you use inhalers (even only as needed), please bring them with you on the day of your procedure.  You will be contacted by our office prior to your procedure for directions on holding your Plavix.  If you do not hear from our office 1 week prior to your scheduled procedure, please call [phone] to discuss.   It was a pleasure to see you today!  Vito Cirigliano, D.O.

## 2021-03-28 ENCOUNTER — Telehealth: Payer: Self-pay

## 2021-03-28 NOTE — Telephone Encounter (Signed)
Phone call to patient with his pathology results. Patient aware of result.

## 2021-03-28 NOTE — Telephone Encounter (Signed)
-----   Message from Lavonna Monarch, MD sent at 03/28/2021  6:01 AM EDT ----- These are near her previous [place] removal so I would definitely recommend Mr. Krengel have Mohs surgery.

## 2021-03-29 ENCOUNTER — Other Ambulatory Visit (HOSPITAL_COMMUNITY): Payer: Self-pay

## 2021-03-29 ENCOUNTER — Encounter: Payer: Self-pay | Admitting: Physician Assistant

## 2021-03-29 ENCOUNTER — Other Ambulatory Visit: Payer: Self-pay | Admitting: Medical

## 2021-03-29 ENCOUNTER — Ambulatory Visit (INDEPENDENT_AMBULATORY_CARE_PROVIDER_SITE_OTHER): Payer: 59 | Admitting: Physician Assistant

## 2021-03-29 ENCOUNTER — Other Ambulatory Visit (HOSPITAL_BASED_OUTPATIENT_CLINIC_OR_DEPARTMENT_OTHER): Payer: Self-pay

## 2021-03-29 DIAGNOSIS — M869 Osteomyelitis, unspecified: Secondary | ICD-10-CM

## 2021-03-29 NOTE — Progress Notes (Signed)
Office Visit Note   [name]           Date of Birth: 07/23/1961           MRN: [id number] Visit Date: 03/29/2021              Requested by: Mackie Pai, PA-C [place],  [place] [id number] PCP: Mackie Pai, PA-C  Chief Complaint  Patient presents with   Left Foot - Routine Post Op      HPI: Patient is in follow-up today for his ray amputation.  He is wearing a postop shoe.  He has no complaints.  He still has a little bit of serous drainage  Assessment & Plan: Visit Diagnoses: No diagnosis found.  Plan: Follow-up in 2 weeks have made him some felt relieving donuts.  He understands to keep the area clean and dry  Follow-Up Instructions: No follow-ups on file.   Ortho Exam  Patient is alert, oriented, no adenopathy, well-dressed, normal affect, normal respiratory effort. Examination overall healed incision with the current wound dehiscence small area on the plantar surface of the foot.  Does not probe deeply does not having any surrounding erythema or foul odor.  No cellulitis.  No probe to bone or tunneling  Imaging: No results found. No images are attached to the encounter.  Labs: Lab Results  Component Value Date   HGBA1C 7.4 (A) 01/27/2021   HGBA1C 6.9 (H) 04/15/2020   HGBA1C 7.8 (H) 05/08/2019   REPTSTATUS 01/16/2021 FINAL 01/11/2021   CULT  01/11/2021    RARE METHICILLIN RESISTANT STAPHYLOCOCCUS AUREUS NO ANAEROBES ISOLATED Performed at [facility], [place] [address], [place], [place] [address]    LABORGA METHICILLIN RESISTANT STAPHYLOCOCCUS AUREUS 01/11/2021     Lab Results  Component Value Date   ALBUMIN 3.9 05/08/2019   ALBUMIN 2.7 (L) 06/23/2018   ALBUMIN 4.4 01/17/2018    Lab Results  Component Value Date   MG 2.1 05/08/2019   MG 2.2 06/26/2018   MG 3.0 (H) 06/26/2018   No results found for: VD25OH  No results found for: PREALBUMIN CBC EXTENDED Latest Ref Rng & Units 01/11/2021 04/15/2020  01/06/2020  WBC 4.0 - 10.5 K/uL 13.3(H) 7.3 8.9  RBC 4.22 - 5.81 MIL/uL 4.62 4.42 4.21(L)  HGB 13.0 - 17.0 g/dL 14.2 13.7 12.6(L)  HCT 39.0 - 52.0 % 42.0 40.2 39.0  PLT 150 - 400 K/uL 330 261 411(H)  NEUTROABS 1,500 - 7,800 cells/uL - 4,314 -  LYMPHSABS 850 - 3,900 cells/uL - 2,102 -     There is no height or weight on file to calculate BMI.  Orders:  No orders of the defined types were placed in this encounter.  No orders of the defined types were placed in this encounter.    Procedures: No procedures performed  Clinical Data: No additional findings.  ROS:  All other systems negative, except as noted in the HPI. Review of Systems  Objective: Vital Signs: There were no vitals taken for this visit.  Specialty Comments:  No specialty comments available.  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   Acute coronary syndrome ([place]) 06/22/2018   Nonhealing skin ulcer ([place]) 06/22/2018   Coronary artery disease involving native coronary artery with unstable angina pectoris ([place]) 06/22/2018   Acute myocardial infarction ([place]) 06/22/2018   Hyperlipidemia    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   Vertigo 08/20/2016   Myringotomy tube status 08/20/2016   Ear fullness, left 08/20/2016   Asymmetrical left sensorineural hearing loss 02/06/2016   Neuropathy of left foot 02/03/2015   Charcot's joint of left foot, non-diabetic 02/03/2015   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   Cramping of hands 06/02/2014   Eustachian tube dysfunction 06/02/2014   HTN (hypertension) 05/07/2014    Obstructive sleep apnea 05/07/2014   Past Medical History:  Diagnosis Date   Acute myocardial infarction ([place]) 06/22/2018   Allergy    Basal cell carcinoma    Charcot's joint of left foot, non-diabetic    Coronary artery disease involving native coronary artery with unstable angina pectoris ([place]) 06/22/2018   Diabetes ([place])    type 2   GERD (gastroesophageal reflux disease)    History of kidney stones    passed   Hyperlipidemia    Hypertension    Neuropathy of left foot    Pneumonia    As a child   Right foot ulcer ([place])    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   LIMA to LAD, SVG to Diag, SVG to OM2, SVG to [name], EVH via right thigh and leg   Type II diabetes mellitus with complication, uncontrolled ([place])    Uncontrolled type 2 diabetes mellitus ([place])     Family History  Problem Relation Age of Onset   Diabetes Mother    Stroke Father    Hypertension Father    Hearing loss Father    Colon polyps Father    Colon cancer Neg Hx    Esophageal cancer Neg Hx    Prostate cancer Neg Hx     Past Surgical History:  Procedure Laterality Date   AMPUTATION Right 01/06/2020   Procedure: RIGHT FOOT 2ND RAY AMPUTATION;  Surgeon: Newt Minion, MD;  Location: [facility];  Service: Orthopedics;  Laterality: Right;   AMPUTATION Left 01/11/2021   Procedure: LEFT FOOT 3RD RAY AMPUTATION;  Surgeon: Newt Minion, MD;  Location: [facility];  Service: Orthopedics;  Laterality: Left;   COLONOSCOPY W/ POLYPECTOMY     CORONARY ARTERY BYPASS GRAFT N/A 06/25/2018   Procedure: CORONARY ARTERY BYPASS GRAFTING (CABG) TIMES FOUR USING LEFT INTERNAL MAMMARY ARTERY AND RIGHT ENDOSCOPICALLY HARVESTED SAPHENOUS VEIN;  Surgeon: Rexene Alberts, MD;  Location: [facility];  Service: Open Heart Surgery;  Laterality: N/A;   ENDOVEIN HARVEST OF GREATER SAPHENOUS VEIN Right 06/25/2018   Procedure: ENDOVEIN HARVEST OF GREATER SAPHENOUS VEIN;  Surgeon: Rexene Alberts, MD;  Location: [facility];  Service: Open Heart Surgery;  Laterality: Right;    LEFT HEART CATH AND CORONARY ANGIOGRAPHY N/A 06/22/2018   Procedure: LEFT HEART CATH AND CORONARY ANGIOGRAPHY;  Surgeon: Nigel Mormon, MD;  Location: [facility];  Service: Cardiovascular;  Laterality: N/A;   surgical debridement  Right    Right foot ulcer   TEE WITHOUT CARDIOVERSION N/A 06/25/2018   Procedure: TRANSESOPHAGEAL ECHOCARDIOGRAM (TEE);  Surgeon: Rexene Alberts, MD;  Location: [facility];  Service: Open Heart Surgery;  Laterality: N/A;   Social History   Occupational History   Occupation: attorney  Tobacco Use   Smoking status: Never   Smokeless tobacco: Never  Vaping Use   Vaping Use: Never used  Substance and Sexual Activity   Alcohol use: Yes    Alcohol/week: 2.0 standard drinks    Types: 2 Cans of beer per week    Comment: 2 cans of beer or 2 borbon   Drug use: Never   Sexual activity: Not on file

## 2021-03-31 ENCOUNTER — Encounter: Payer: Self-pay | Admitting: Dermatology

## 2021-03-31 NOTE — Progress Notes (Signed)
   Follow-Up Visit   Subjective  Joshua Branch is a 60 y.o. male who presents for the following: Annual Exam (Spots around neck-  + itch ).  General skin examination, new spots on front of neck. Location:  Duration:  Quality:  Associated Signs/Symptoms: Modifying Factors:  Severity:  Timing: Context:   Objective  Well appearing patient in no apparent distress; mood and affect are within normal limits. Torso - Posterior (Back) Waist up skin examination, no atypical pigmented lesions.  Possible recurrent BCC lower front neck will be biopsied.  Right Neck - Anterior Ulcerated pearly papule just to the left of a previous BCC treatment site along with thickening of the scar at the treatment site.       Mid Neck - Anterior         All skin waist up examined.   Assessment & Plan    Encounter for screening for malignant neoplasm of skin Torso - Posterior (Back)  Annual skin examination.  Neoplasm of uncertain behavior of skin (2) Right Neck - Anterior  Skin / nail biopsy Type of biopsy: tangential   Informed consent: discussed and consent obtained   Timeout: patient name, date of birth, surgical site, and procedure verified   Procedure prep:  Patient was prepped and draped in usual sterile fashion (Non sterile) Prep type:  Chlorhexidine Anesthesia: the lesion was anesthetized in a standard fashion   Anesthetic:  1% lidocaine w/ epinephrine 1-100,000 local infiltration Instrument used: flexible razor blade   Outcome: patient tolerated procedure well   Post-procedure details: wound care instructions given    Specimen 1 - Surgical pathology Differential Diagnosis: bcc vs scc  Check Margins: No  Mid Neck - Anterior  Skin / nail biopsy Type of biopsy: tangential   Informed consent: discussed and consent obtained   Timeout: patient name, date of birth, surgical site, and procedure verified   Procedure prep:  Patient was prepped and draped in usual  sterile fashion (Non sterile) Prep type:  Chlorhexidine Anesthesia: the lesion was anesthetized in a standard fashion   Anesthetic:  1% lidocaine w/ epinephrine 1-100,000 local infiltration Instrument used: flexible razor blade   Outcome: patient tolerated procedure well   Post-procedure details: wound care instructions given    Specimen 2 - Surgical pathology Differential Diagnosis: bcc vs scc  Check Margins: No  If this represents recurrent BCC, I will recommend Mohs surgery.      I, Lavonna Monarch, MD, have reviewed all documentation for this visit.  The documentation on 03/31/21 for the exam, diagnosis, procedures, and orders are all accurate and complete.

## 2021-04-04 ENCOUNTER — Other Ambulatory Visit (HOSPITAL_BASED_OUTPATIENT_CLINIC_OR_DEPARTMENT_OTHER): Payer: Self-pay

## 2021-04-12 ENCOUNTER — Encounter: Payer: Self-pay | Admitting: Physician Assistant

## 2021-04-12 ENCOUNTER — Ambulatory Visit: Payer: 59 | Admitting: Family

## 2021-04-12 ENCOUNTER — Other Ambulatory Visit: Payer: Self-pay

## 2021-04-12 ENCOUNTER — Ambulatory Visit (INDEPENDENT_AMBULATORY_CARE_PROVIDER_SITE_OTHER): Payer: 59 | Admitting: Physician Assistant

## 2021-04-12 DIAGNOSIS — M869 Osteomyelitis, unspecified: Secondary | ICD-10-CM

## 2021-04-12 NOTE — Progress Notes (Signed)
Office Visit Note   [name]           Date of Birth: September 12, 1960           MRN: [id number] Visit Date: 04/12/2021              Requested by: Mackie Pai, PA-C [place],  [place] [address] PCP: Mackie Pai, PA-C  Chief Complaint  Patient presents with   Left Foot - Routine Post Op      HPI:  Patient comes in today for follow-up third ray amputation 3 months ago.  He is actually doing better.  He said that since we saw him last something like a small what he described as mushroom came out of the wound and since then its been healing well.  Assessment & Plan: Visit Diagnoses: No diagnosis found.  Plan: Patient will follow-up in 4 weeks may cancel this appointment if he is doing well we discussed weaning into a supportive shoe which I am fine if he does he just needs to keep an eye on his foot and if he sees any sign of increased drainage he is to go back to using his shoe  Follow-Up Instructions: No follow-ups on file.   Ortho Exam  Patient is alert, oriented, no adenopathy, well-dressed, normal affect, normal respiratory effort. Patient has no swelling in his foot palpable pulses no redness no signs of infection on the plantar surface of the ray amputation he has only a small amount of superficial healing left.  It is filling in nicely with healthy granulation tissue just a touch of drainage that is bloody serous.  No ascending cellulitis.  Imaging: No results found. No images are attached to the encounter.  Labs: Lab Results  Component Value Date   HGBA1C 7.4 (A) 01/27/2021   HGBA1C 6.9 (H) 04/15/2020   HGBA1C 7.8 (H) 05/08/2019   REPTSTATUS 01/16/2021 FINAL 01/11/2021   CULT  01/11/2021    RARE METHICILLIN RESISTANT STAPHYLOCOCCUS AUREUS NO ANAEROBES ISOLATED Performed at [facility], [place] [address] [place], [place] [address]    LABORGA METHICILLIN RESISTANT STAPHYLOCOCCUS AUREUS 01/11/2021     Lab  Results  Component Value Date   ALBUMIN 3.9 05/08/2019   ALBUMIN 2.7 (L) 06/23/2018   ALBUMIN 4.4 01/17/2018    Lab Results  Component Value Date   MG 2.1 05/08/2019   MG 2.2 06/26/2018   MG 3.0 (H) 06/26/2018   No results found for: VD25OH  No results found for: PREALBUMIN CBC EXTENDED Latest Ref Rng & Units 01/11/2021 04/15/2020 01/06/2020  WBC 4.0 - 10.5 K/uL 13.3(H) 7.3 8.9  RBC 4.22 - 5.81 MIL/uL 4.62 4.42 4.21(L)  HGB 13.0 - 17.0 g/dL 14.2 13.7 12.6(L)  HCT 39.0 - 52.0 % 42.0 40.2 39.0  PLT 150 - 400 K/uL 330 261 411(H)  NEUTROABS 1,500 - 7,800 cells/uL - 4,314 -  LYMPHSABS 850 - 3,900 cells/uL - 2,102 -     There is no height or weight on file to calculate BMI.  Orders:  No orders of the defined types were placed in this encounter.  No orders of the defined types were placed in this encounter.    Procedures: No procedures performed  Clinical Data: No additional findings.  ROS:  All other systems negative, except as noted in the HPI. Review of Systems  Objective: Vital Signs: There were no vitals taken for this visit.  Specialty Comments:  No specialty comments available.  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 ([place]) 01/18/2021   Osteomyelitis of third toe of left foot ([name]    Cutaneous abscess of left foot    Osteomyelitis of second toe of right foot ([place])    Poorly controlled type 2 diabetes mellitus with circulatory disorder ([place]) 07/15/2018   Numbness in both hands 07/15/2018   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   Acute coronary syndrome ([place]) 06/22/2018   Nonhealing skin ulcer ([place]) 06/22/2018   Coronary artery disease involving native coronary artery with unstable angina pectoris ([place]) 06/22/2018   Acute myocardial infarction ([place]) 06/22/2018   Hyperlipidemia    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   Vertigo 08/20/2016   Myringotomy tube status  08/20/2016   Ear fullness, left 08/20/2016   Asymmetrical left sensorineural hearing loss 02/06/2016   Neuropathy of left foot 02/03/2015   Charcot's joint of left foot, non-diabetic 02/03/2015   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   Cramping of hands 06/02/2014   Eustachian tube dysfunction 06/02/2014   HTN (hypertension) 05/07/2014   Obstructive sleep apnea 05/07/2014   Past Medical History:  Diagnosis Date   Acute myocardial infarction ([place]) 06/22/2018   Allergy    Basal cell carcinoma    Charcot's joint of left foot, non-diabetic    Coronary artery disease involving native coronary artery with unstable angina pectoris ([place]) 06/22/2018   Diabetes ([place])    type 2   GERD (gastroesophageal reflux disease)    History of kidney stones    passed   Hyperlipidemia    Hypertension    Neuropathy of left foot    Pneumonia    As a child   Right foot ulcer ([place])    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   LIMA to LAD, SVG to Diag, SVG to OM2, SVG to [name], EVH via right thigh and leg   Type II diabetes mellitus with complication, uncontrolled ([place])    Uncontrolled type 2 diabetes mellitus ([place])     Family History  Problem Relation Age of Onset   Diabetes Mother    Stroke Father    Hypertension Father    Hearing loss Father    Colon polyps Father    Colon cancer Neg Hx    Esophageal cancer Neg Hx    Prostate cancer Neg Hx     Past Surgical History:  Procedure Laterality Date   AMPUTATION Right 01/06/2020   Procedure: RIGHT FOOT 2ND RAY AMPUTATION;  Surgeon: Newt Minion, MD;  Location: [facility];  Service: Orthopedics;  Laterality: Right;   AMPUTATION Left 01/11/2021   Procedure: LEFT FOOT 3RD RAY AMPUTATION;  Surgeon: Newt Minion, MD;  Location: [facility];  Service: Orthopedics;  Laterality: Left;   COLONOSCOPY W/ POLYPECTOMY     CORONARY ARTERY BYPASS GRAFT N/A 06/25/2018   Procedure: CORONARY ARTERY BYPASS GRAFTING (CABG) TIMES FOUR USING LEFT INTERNAL MAMMARY ARTERY AND RIGHT ENDOSCOPICALLY  HARVESTED SAPHENOUS VEIN;  Surgeon: Rexene Alberts, MD;  Location: [facility];  Service: Open Heart Surgery;  Laterality: N/A;   ENDOVEIN HARVEST OF GREATER SAPHENOUS VEIN Right 06/25/2018   Procedure: ENDOVEIN HARVEST OF GREATER SAPHENOUS VEIN;  Surgeon: Rexene Alberts, MD;  Location: [facility];  Service: Open Heart Surgery;  Laterality: Right;   LEFT HEART CATH AND CORONARY ANGIOGRAPHY N/A 06/22/2018   Procedure: LEFT HEART CATH AND CORONARY ANGIOGRAPHY;  Surgeon: Nigel Mormon, MD;  Location: [facility];  Service: Cardiovascular;  Laterality: N/A;   surgical debridement  Right    Right foot ulcer   TEE WITHOUT CARDIOVERSION N/A 06/25/2018   Procedure: TRANSESOPHAGEAL ECHOCARDIOGRAM (TEE);  Surgeon: Rexene Alberts, MD;  Location: [facility];  Service: Open Heart Surgery;  Laterality: N/A;   Social History   Occupational History   Occupation: attorney  Tobacco Use   Smoking status: Never   Smokeless tobacco: Never  Vaping Use   Vaping Use: Never used  Substance and Sexual Activity   Alcohol use: Yes    Alcohol/week: 2.0 standard drinks    Types: 2 Cans of beer per week    Comment: 2 cans of beer or 2 borbon   Drug use: Never   Sexual activity: Not on file

## 2021-04-14 ENCOUNTER — Encounter: Payer: Self-pay | Admitting: Gastroenterology

## 2021-04-14 ENCOUNTER — Other Ambulatory Visit: Payer: Self-pay

## 2021-04-14 ENCOUNTER — Ambulatory Visit (AMBULATORY_SURGERY_CENTER): Payer: 59 | Admitting: Gastroenterology

## 2021-04-14 VITALS — BP 131/87 | HR 64 | Temp 97.8°F | Resp 18 | Ht 74.5 in | Wt 240.0 lb

## 2021-04-14 DIAGNOSIS — Z86010 Personal history of colonic polyps: Secondary | ICD-10-CM

## 2021-04-14 DIAGNOSIS — K640 First degree hemorrhoids: Secondary | ICD-10-CM

## 2021-04-14 DIAGNOSIS — K5730 Diverticulosis of large intestine without perforation or abscess without bleeding: Secondary | ICD-10-CM

## 2021-04-14 DIAGNOSIS — Z1211 Encounter for screening for malignant neoplasm of colon: Secondary | ICD-10-CM | POA: Diagnosis not present

## 2021-04-14 MED ORDER — SODIUM CHLORIDE 0.9 % IV SOLN: 500.0000 mL | Freq: Once | INTRAVENOUS | Status: DC

## 2021-04-14 NOTE — Patient Instructions (Addendum)
Please see handouts given to you on Diverticulosis and Hemorrhoids. You may resume Plavix today.   YOU HAD AN ENDOSCOPIC PROCEDURE TODAY AT [facility]:   Refer to the procedure report that was given to you for any specific questions about what was found during the examination.  If the procedure report does not answer your questions, please call your gastroenterologist to clarify.  If you requested that your care partner not be given the details of your procedure findings, then the procedure report has been included in a sealed envelope for you to review at your convenience later.  YOU SHOULD EXPECT: Some feelings of bloating in the abdomen. Passage of more gas than usual.  Walking can help get rid of the air that was put into your GI tract during the procedure and reduce the bloating. If you had a lower endoscopy (such as a colonoscopy or flexible sigmoidoscopy) you may notice spotting of blood in your stool or on the toilet paper. If you underwent a bowel prep for your procedure, you may not have a normal bowel movement for a few days.  Please Note:  You might notice some irritation and congestion in your nose or some drainage.  This is from the oxygen used during your procedure.  There is no need for concern and it should clear up in a day or so.  SYMPTOMS TO REPORT IMMEDIATELY:  Following lower endoscopy (colonoscopy or flexible sigmoidoscopy):  Excessive amounts of blood in the stool  Significant tenderness or worsening of abdominal pains  Swelling of the abdomen that is new, acute  Fever of 100F or higher  For urgent or emergent issues, a gastroenterologist can be reached at any hour by calling [phone]. Do not use MyChart messaging for urgent concerns.    DIET:  We do recommend a small meal at first, but then you may proceed to your regular diet.  Drink plenty of fluids but you should avoid alcoholic beverages for 24 hours.  ACTIVITY:  You should plan to take it  easy for the rest of today and you should NOT DRIVE or use heavy machinery until tomorrow (because of the sedation medicines used during the test).    FOLLOW UP: Our staff will call the number listed on your records 48-72 hours following your procedure to check on you and address any questions or concerns that you may have regarding the information given to you following your procedure. If we do not reach you, we will leave a message.  We will attempt to reach you two times.  During this call, we will ask if you have developed any symptoms of COVID 19. If you develop any symptoms (ie: fever, flu-like symptoms, shortness of breath, cough etc.) before then, please call [phone].  If you test positive for Covid 19 in the 2 weeks post procedure, please call and report this information to [place].    If any biopsies were taken you will be contacted by phone or by letter within the next 1-3 weeks.  Please call us at [phone] if you have not heard about the biopsies in 3 weeks.    SIGNATURES/CONFIDENTIALITY: You and/or your care partner have signed paperwork which will be entered into your electronic medical record.  These signatures attest to the fact that that the information above on your After Visit Summary has been reviewed and is understood.  Full responsibility of the confidentiality of this discharge information lies with you and/or your care-partner.

## 2021-04-14 NOTE — Op Note (Signed)
[facility] Patient Name: Joshua Branch Procedure Date: 04/14/2021 8:38 AM MRN: [id number] Endoscopist: Gerrit Heck , MD Age: 60 Referring MD:  Date of Birth: 05-11-1961 Gender: Male Account #: [id number] Procedure:                Colonoscopy Indications:              Surveillance: Personal history of adenomatous                            polyps on last colonoscopy 3 years ago                           Colonoscopy (03/2018): 15 mm ascending polyp, 3                            polyps in rectum/transverse, 8 mm polyp in sigmoid;                            all tubular adenomas. Sigmoid diverticulosis.                            Normal TI. Repeat in 3 years. Medicines:                Monitored Anesthesia Care Procedure:                Pre-Anesthesia Assessment:                           - Prior to the procedure, a History and Physical                            was performed, and patient medications and                            allergies were reviewed. [name] tolerance of                            previous anesthesia was also reviewed. The risks                            and benefits of the procedure and the sedation                            options and risks were discussed with the patient.                            All questions were answered, and informed consent                            was obtained. Prior Anticoagulants: The patient has                            taken Plavix (clopidogrel), last dose was 5 days  prior to procedure. ASA Grade Assessment: III - A                            patient with severe systemic disease. After                            reviewing the risks and benefits, the patient was                            deemed in satisfactory condition to undergo the                            procedure.                           After obtaining informed consent, the colonoscope                            was  passed under direct vision. Throughout the                            procedure, the patient's blood pressure, pulse, and                            oxygen saturations were monitored continuously. The                            Olympus CF-HQ190L ([id number]) Colonoscope was                            introduced through the anus and advanced to the the                            terminal ileum. The colonoscopy was performed                            without difficulty. The patient tolerated the                            procedure well. The quality of the bowel                            preparation was good. The terminal ileum, ileocecal                            valve, appendiceal orifice, and rectum were                            photographed. Scope In: 8:47:43 AM Scope Out: 9:00:34 AM Scope Withdrawal Time: 0 hours 11 minutes 5 seconds  Total Procedure Duration: 0 hours 12 minutes 51 seconds  Findings:                 The perianal and digital rectal examinations were  normal.                           A few small-mouthed diverticula were found in the                            sigmoid colon.                           Non-bleeding internal hemorrhoids were found during                            retroflexion. The hemorrhoids were small.                           The exam was otherwise normal throughout the                            remainder of the colon.                           The terminal ileum appeared normal. Complications:            No immediate complications. Estimated Blood Loss:     Estimated blood loss: none. Impression:               - Diverticulosis in the sigmoid colon.                           - Non-bleeding internal hemorrhoids.                           - The examined portion of the ileum was normal.                           - No specimens collected. Recommendation:           - Patient has a contact number available for                             emergencies. The signs and symptoms of potential                            delayed complications were discussed with the                            patient. Return to normal activities tomorrow.                            Written discharge instructions were provided to the                            patient.                           - Resume previous diet.                           -  Continue present medications.                           - Resume Plavix (clopidogrel) at prior dose today.                           - Repeat colonoscopy in 5 years for surveillance.                           - Return to GI clinic PRN. Gerrit Heck, MD 04/14/2021 9:07:36 AM

## 2021-04-14 NOTE — Progress Notes (Signed)
Sedate, gd SR, tolerated procedure well, VSS, report to RN 

## 2021-04-14 NOTE — Progress Notes (Signed)
GASTROENTEROLOGY PROCEDURE H&P NOTE   Primary Care Physician: Mackie Pai, PA-C    Reason for Procedure:   Colon polyps  Plan:    Colonoscopy  Patient is appropriate for endoscopic procedure(s) in the ambulatory ([place]) setting.  The nature of the procedure, as well as the risks, benefits, and alternatives were carefully and thoroughly reviewed with the patient. Ample time for discussion and questions allowed. The patient understood, was satisfied, and agreed to proceed.     HPI: Joshua Branch is a 60 y.o. male who presents for colonoscopy for ongoing polyp surveillance.  Patient was most recently seen in the Gastroenterology Clinic on 03/22/2021.  No interval change in medical history since that appointment. Please refer to that note for full details regarding GI history and clinical presentation.   Holding Plavix x5 days for colonsocopy today.   Endoscopic History: - EGD (03/2018): LA Grade A esophagitis, Hill grade 2, empiric 52 French Maloney dilation, antral gastritis with erosions.  Treated with high-dose PPI - Colonoscopy (03/2018): 15 mm ascending polyp, 3 polyps in rectum/transverse, 8 mm polyp in sigmoid; all tubular adenomas.  Sigmoid diverticulosis.  Normal TI.  Repeat in 3 years.  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   LIMA to LAD, SVG to Diag, SVG to OM2, SVG to [name], EVH via right thigh and leg   Type II diabetes mellitus with complication, uncontrolled ([place])    Uncontrolled  type 2 diabetes mellitus ([place])     Past Surgical History:  Procedure Laterality Date   AMPUTATION Right 01/06/2020   Procedure: RIGHT FOOT 2ND RAY AMPUTATION;  Surgeon: Newt Minion, MD;  Location: [facility];  Service: Orthopedics;  Laterality: Right;   AMPUTATION Left 01/11/2021   Procedure: LEFT FOOT 3RD RAY AMPUTATION;  Surgeon: Newt Minion, MD;  Location: [facility];  Service: Orthopedics;  Laterality: Left;   COLONOSCOPY W/ POLYPECTOMY     CORONARY ARTERY BYPASS GRAFT N/A 06/25/2018   Procedure: CORONARY ARTERY BYPASS GRAFTING (CABG) TIMES FOUR USING LEFT INTERNAL MAMMARY ARTERY AND RIGHT ENDOSCOPICALLY HARVESTED SAPHENOUS VEIN;  Surgeon: Rexene Alberts, MD;  Location: [facility];  Service: Open Heart Surgery;  Laterality: N/A;   ENDOVEIN HARVEST OF GREATER SAPHENOUS VEIN Right 06/25/2018   Procedure: ENDOVEIN HARVEST OF GREATER SAPHENOUS VEIN;  Surgeon: Rexene Alberts, MD;  Location: [facility];  Service: Open Heart Surgery;  Laterality: Right;   LEFT HEART CATH AND CORONARY ANGIOGRAPHY N/A 06/22/2018   Procedure: LEFT HEART CATH AND CORONARY ANGIOGRAPHY;  Surgeon: Nigel Mormon, MD;  Location: [facility];  Service: Cardiovascular;  Laterality: N/A;   surgical debridement  Right    Right foot ulcer   TEE WITHOUT CARDIOVERSION N/A 06/25/2018   Procedure: TRANSESOPHAGEAL ECHOCARDIOGRAM (TEE);  Surgeon: Rexene Alberts, MD;  Location: [facility];  Service: Open Heart Surgery;  Laterality: N/A;    Prior to  Admission medications   Medication Sig Start Date End Date Taking? Authorizing Provider  aspirin EC 81 MG tablet Take 81 mg by mouth daily.   Yes [provider]  Blood Glucose Monitoring Suppl (FREESTYLE LITE) DEVI  07/15/18  Yes [provider]  glucose blood (FREESTYLE LITE) test strip USE AS INSTRUCTED TO CHECK 1-2X A DAY 01/18/20  Yes Philemon Kingdom, MD  atorvastatin (LIPITOR) 80 MG tablet Take 1 tablet (80 mg total) by mouth daily. 03/20/21 03/20/22  Lorretta Harp, MD  cetirizine (ZYRTEC) 10 MG tablet Take 1 tablet (10 mg total) by mouth daily. Patient taking differently: Take 10 mg by mouth daily as needed for allergies. 10/03/18   Saguier, Percell Miller, PA-C  clopidogrel (PLAVIX) 75 MG tablet TAKE 1 TABLET (75 MG TOTAL) BY MOUTH DAILY. Patient taking differently: Take 75 mg by mouth daily. 08/16/20 08/16/21  Lorretta Harp, MD  Coenzyme Q10 (CO Q-10) 300 MG CAPS Take 300 mg by mouth daily.    [provider]  dapagliflozin propanediol (FARXIGA) 10 MG TABS tablet Take 1 tablet (10 mg total) by mouth daily before breakfast. 01/27/21   Philemon Kingdom, MD  hydrochlorothiazide (MICROZIDE) 12.5 MG capsule TAKE 1 CAPSULE (12.5 MG TOTAL) BY MOUTH DAILY. 02/28/21 02/28/22  Saguier, Percell Miller, PA-C  Lancets MISC Check sugars 3 times a day E11.9 07/03/18   Saguier, Percell Miller, PA-C  lisinopril (ZESTRIL) 10 MG tablet TAKE 1 TABLET (10 MG TOTAL) BY MOUTH DAILY. 03/10/21 03/10/22  Saguier, Percell Miller, PA-C  metFORMIN (GLUCOPHAGE) 500 MG tablet Take 1 tablet (500 mg total) by mouth 2 (two) times daily with a meal. 02/06/21   Philemon Kingdom, MD  metoprolol tartrate (LOPRESSOR) 25 MG tablet TAKE 1 TABLET (25 MG TOTAL) BY MOUTH 2 (TWO) TIMES DAILY. Patient taking differently: Take 25 mg by mouth 2 (two) times daily. 08/16/20 08/16/21  Lorretta Harp, MD  Multiple Vitamin (MULTIVITAMIN WITH MINERALS) TABS tablet Take 1 tablet by mouth daily.    [provider]  nitroGLYCERIN (NITRODUR - DOSED IN MG/24 HR) 0.2 mg/hr patch Place 1 patch (0.2 mg total) onto the skin daily. 02/21/21   Suzan Slick, NP  pentoxifylline (TRENTAL) 400 MG CR tablet Take 1 tablet (400 mg total) by mouth 3 (three) times daily with meals. 02/21/21   Suzan Slick, NP  sildenafil (REVATIO) 20 MG tablet TAKE 2-5 TABLETS BY MOUTH ONE HOUR BEFORE SEX AS NEEDED **MAX OF 5 TABLETS WITHIN A 24 HOUR PERIOD** Patient taking differently: Take 20-100 mg by mouth See admin instructions. Take 1 hour prior to  sex as needed. Max of '100mg'$  within a 24 hour period. 12/15/20   Saguier, Percell Miller, PA-C  vitamin B-12 (CYANOCOBALAMIN) 1000 MCG tablet Take 1,000 mcg by mouth daily.    [provider]    Current Outpatient Medications  Medication Sig Dispense Refill   aspirin EC 81 MG tablet Take 81 mg by mouth daily.     Blood Glucose Monitoring Suppl (FREESTYLE LITE) DEVI      glucose blood (FREESTYLE LITE) test strip USE AS INSTRUCTED TO CHECK 1-2X A DAY 100 strip 1   atorvastatin (LIPITOR) 80 MG tablet Take 1 tablet (80 mg total) by mouth daily. 90 tablet 3   cetirizine (ZYRTEC) 10 MG tablet Take 1 tablet (10 mg total) by mouth daily. (Patient taking differently: Take 10 mg by mouth daily as needed for allergies.) 30 tablet 11   clopidogrel (PLAVIX) 75 MG tablet TAKE 1 TABLET (75 MG TOTAL) BY  MOUTH DAILY. (Patient taking differently: Take 75 mg by mouth daily.) 90 tablet 3   Coenzyme Q10 (CO Q-10) 300 MG CAPS Take 300 mg by mouth daily.     dapagliflozin propanediol (FARXIGA) 10 MG TABS tablet Take 1 tablet (10 mg total) by mouth daily before breakfast. 90 tablet 3   hydrochlorothiazide (MICROZIDE) 12.5 MG capsule TAKE 1 CAPSULE (12.5 MG TOTAL) BY MOUTH DAILY. 90 capsule 1   Lancets MISC Check sugars 3 times a day E11.9 100 each 0   lisinopril (ZESTRIL) 10 MG tablet TAKE 1 TABLET (10 MG TOTAL) BY MOUTH DAILY. 90 tablet 0   metFORMIN (GLUCOPHAGE) 500 MG tablet Take 1 tablet (500 mg total) by mouth 2 (two) times daily with a meal. 180 tablet 3   metoprolol tartrate (LOPRESSOR) 25 MG tablet TAKE 1 TABLET (25 MG TOTAL) BY MOUTH 2 (TWO) TIMES DAILY. (Patient taking differently: Take 25 mg by mouth 2 (two) times daily.) 180 tablet 3   Multiple Vitamin (MULTIVITAMIN WITH MINERALS) TABS tablet Take 1 tablet by mouth daily.     nitroGLYCERIN (NITRODUR - DOSED IN MG/24 HR) 0.2 mg/hr patch Place 1 patch (0.2 mg total) onto the skin daily. 30 patch 1   pentoxifylline (TRENTAL) 400 MG CR tablet Take 1 tablet (400  mg total) by mouth 3 (three) times daily with meals. 90 tablet 3   sildenafil (REVATIO) 20 MG tablet TAKE 2-5 TABLETS BY MOUTH ONE HOUR BEFORE SEX AS NEEDED **MAX OF 5 TABLETS WITHIN A 24 HOUR PERIOD** (Patient taking differently: Take 20-100 mg by mouth See admin instructions. Take 1 hour prior to sex as needed. Max of '100mg'$  within a 24 hour period.) 100 tablet 0   vitamin B-12 (CYANOCOBALAMIN) 1000 MCG tablet Take 1,000 mcg by mouth daily.     Current Facility-Administered Medications  Medication Dose Route Frequency Provider Last Rate Last Admin   0.9 %  sodium chloride infusion  500 mL Intravenous Once Niya Behler V, DO        Allergies as of 04/14/2021 - Review Complete 04/14/2021  Allergen Reaction Noted   Apricot kernel oil [prunus] Swelling 06/24/2018   Cherry Swelling 06/24/2018   Jardiance [empagliflozin] Diarrhea and Nausea And Vomiting 07/22/2018   Other Other (See Comments) 03/17/2018   Peach [prunus persica] Swelling 06/24/2018   Plum pulp Swelling 06/24/2018    Family History  Problem Relation Age of Onset   Diabetes Mother    Stroke Father    Hypertension Father    Hearing loss Father    Colon polyps Father    Colon cancer Neg Hx    Esophageal cancer Neg Hx    Prostate cancer Neg Hx     Social History   Socioeconomic History   Marital status: Married    Spouse name: [name]   Number of children: 2   Years of education: Not on file   Highest education level: Professional school degree (e.g., MD, DDS, DVM, JD)  Occupational History   Occupation: attorney  Tobacco Use   Smoking status: Never   Smokeless tobacco: Never  Vaping Use   Vaping Use: Never used  Substance and Sexual Activity   Alcohol use: Yes    Alcohol/week: 2.0 standard drinks    Types: 2 Cans of beer per week    Comment: 2 cans of beer or 2 borbon   Drug use: Never   Sexual activity: Not on file  Other Topics Concern   Not on file  Social History Narrative  Not on file   Social  Determinants of Health   Financial Resource Strain: Not on file  Food Insecurity: Not on file  Transportation Needs: Not on file  Physical Activity: Not on file  Stress: Not on file  Social Connections: Not on file  Intimate Partner Violence: Not on file    Physical Exam: Vital signs in last 24 hours: '@BP'$  (!) 141/85   Pulse 82   Temp 97.8 F (36.6 C)   Ht 6' 2.5" (1.892 m)   Wt 240 lb (108.9 kg)   SpO2 94%   BMI 30.40 kg/m  GEN: NAD EYE: Sclerae anicteric ENT: MMM CV: Non-tachycardic Pulm: CTA b/l GI: Soft, NT/ND NEURO:  Alert & Oriented x 3   Gerrit Heck, DO [facility]   04/14/2021 8:39 AM

## 2021-04-14 NOTE — Progress Notes (Signed)
Vital signs checked by:CW ? ?The medical and surgical history was reviewed and verified with the patient. ? ?

## 2021-04-18 ENCOUNTER — Telehealth: Payer: Self-pay | Admitting: *Deleted

## 2021-04-18 NOTE — Telephone Encounter (Signed)
  Follow up Call-  Call back number 04/14/2021  Post procedure Call Back phone  # [phone]  Permission to leave phone message Yes  Some recent data might be hidden     Patient questions:  Do you have a fever, pain , or abdominal swelling? No. Pain Score  0 *  Have you tolerated food without any problems? Yes.    Have you been able to return to your normal activities? Yes.    Do you have any questions about your discharge instructions: Diet   No. Medications  No. Follow up visit  No.  Do you have questions or concerns about your Care? No.  Actions: * If pain score is 4 or above: No action needed, pain <4.Have you developed a fever since your procedure? no  2.   Have you had an respiratory symptoms (SOB or cough) since your procedure? no  3.   Have you tested positive for COVID 19 since your procedure no  4.   Have you had any family members/close contacts diagnosed with the COVID 19 since your procedure?  no   If yes to any of these questions please route to Joylene John, RN and Joella Prince, RN

## 2021-04-24 ENCOUNTER — Other Ambulatory Visit (HOSPITAL_BASED_OUTPATIENT_CLINIC_OR_DEPARTMENT_OTHER): Payer: Self-pay

## 2021-05-01 ENCOUNTER — Other Ambulatory Visit: Payer: Self-pay

## 2021-05-01 ENCOUNTER — Other Ambulatory Visit (HOSPITAL_BASED_OUTPATIENT_CLINIC_OR_DEPARTMENT_OTHER): Payer: Self-pay

## 2021-05-01 ENCOUNTER — Ambulatory Visit: Payer: 59 | Attending: Internal Medicine

## 2021-05-01 ENCOUNTER — Ambulatory Visit (INDEPENDENT_AMBULATORY_CARE_PROVIDER_SITE_OTHER): Payer: 59 | Admitting: Medical

## 2021-05-01 ENCOUNTER — Telehealth: Payer: Self-pay | Admitting: Medical

## 2021-05-01 VITALS — BP 130/87 | HR 89 | Resp 18 | Ht 74.5 in | Wt 239.0 lb

## 2021-05-01 DIAGNOSIS — L97519 Non-pressure chronic ulcer of other part of right foot with unspecified severity: Secondary | ICD-10-CM

## 2021-05-01 DIAGNOSIS — N528 Other male erectile dysfunction: Secondary | ICD-10-CM | POA: Diagnosis not present

## 2021-05-01 DIAGNOSIS — Z125 Encounter for screening for malignant neoplasm of prostate: Secondary | ICD-10-CM

## 2021-05-01 DIAGNOSIS — Z23 Encounter for immunization: Secondary | ICD-10-CM

## 2021-05-01 DIAGNOSIS — I10 Essential (primary) hypertension: Secondary | ICD-10-CM

## 2021-05-01 DIAGNOSIS — Z0000 Encounter for general adult medical examination without abnormal findings: Secondary | ICD-10-CM

## 2021-05-01 LAB — CBC WITH DIFFERENTIAL/PLATELET
Basophils Absolute: 0 10*3/uL (ref 0.0–0.1)
Basophils Relative: 0.5 % (ref 0.0–3.0)
Eosinophils Absolute: 0.1 10*3/uL (ref 0.0–0.7)
Eosinophils Relative: 1.1 % (ref 0.0–5.0)
HCT: 50.5 % (ref 39.0–52.0)
Hemoglobin: 16.9 g/dL (ref 13.0–17.0)
Lymphocytes Relative: 24.4 % (ref 12.0–46.0)
Lymphs Abs: 2.1 10*3/uL (ref 0.7–4.0)
MCHC: 33.4 g/dL (ref 30.0–36.0)
MCV: 92.1 fl (ref 78.0–100.0)
Monocytes Absolute: 0.8 10*3/uL (ref 0.1–1.0)
Monocytes Relative: 9 % (ref 3.0–12.0)
Neutro Abs: 5.6 10*3/uL (ref 1.4–7.7)
Neutrophils Relative %: 65 % (ref 43.0–77.0)
Platelets: 251 10*3/uL (ref 150.0–400.0)
RBC: 5.48 Mil/uL (ref 4.22–5.81)
RDW: 13.6 % (ref 11.5–15.5)
WBC: 8.6 10*3/uL (ref 4.0–10.5)

## 2021-05-01 LAB — COMPREHENSIVE METABOLIC PANEL
ALT: 16 U/L (ref 0–53)
AST: 13 U/L (ref 0–37)
Albumin: 4.3 g/dL (ref 3.5–5.2)
Alkaline Phosphatase: 114 U/L (ref 39–117)
BUN: 23 mg/dL (ref 6–23)
CO2: 29 mEq/L (ref 19–32)
Calcium: 9.6 mg/dL (ref 8.4–10.5)
Chloride: 102 mEq/L (ref 96–112)
Creatinine, Ser: 1.15 mg/dL (ref 0.40–1.50)
GFR: 70.04 mL/min (ref >60.00)
Glucose, Bld: 184 mg/dL — ABNORMAL HIGH (ref 70–99)
Potassium: 4.3 mEq/L (ref 3.5–5.1)
Sodium: 142 mEq/L (ref 135–145)
Total Bilirubin: 0.8 mg/dL (ref 0.2–1.2)
Total Protein: 7.1 g/dL (ref 6.0–8.3)

## 2021-05-01 LAB — PSA: PSA: 0.92 ng/mL (ref 0.10–4.00)

## 2021-05-01 LAB — LIPID PANEL
Cholesterol: 124 mg/dL (ref 0–200)
HDL: 35 mg/dL — ABNORMAL LOW (ref >39.00)
NonHDL: 89.24
Total CHOL/HDL Ratio: 4
Triglycerides: 229 mg/dL — ABNORMAL HIGH (ref 0.0–149.0)
VLDL: 45.8 mg/dL — ABNORMAL HIGH (ref 0.0–40.0)

## 2021-05-01 LAB — LDL CHOLESTEROL, DIRECT: Direct LDL: 52 mg/dL

## 2021-05-01 MED ORDER — SILDENAFIL CITRATE 20 MG PO TABS
ORAL_TABLET | ORAL | 0 refills | Status: DC
  Filled 2021-05-01: qty 50, 10d supply, fill #0

## 2021-05-01 NOTE — Progress Notes (Signed)
   Covid-19 Vaccination Clinic  Name:  SHADD DUNSTAN    MRN: [id number] DOB: Sep 07, 1960  05/01/2021  Mr. Devito was observed post Covid-19 immunization for 15 minutes without incident. He was provided with Vaccine Information Sheet and instruction to access the V-Safe system.   Mr. Myler was instructed to call 911 with any severe reactions post vaccine: Difficulty breathing  Swelling of face and throat  A fast heartbeat  A bad rash all over body  Dizziness and weakness

## 2021-05-01 NOTE — Telephone Encounter (Signed)
Inez Catalina,  I saw mutual pt who has hx of MI,CAD and cabg.   No recent chest pain. Denies any for 2 years. I saw your last office note.   He has no nitro pills. No longer using nitro patches for slow healing ulcer.  He has erectile dysfunction. Uses revatio in the past. Just wanted to coordinate with you in event you were going to add back nitro tabs in the past. With patient history  I rx revation/viagra with caution and usually want cardiology to be aware.  Thanks,  Mackie Pai, PA-C

## 2021-05-01 NOTE — Patient Instructions (Addendum)
For you wellness exam today I have ordered cbc, cmp, psa and lipid panel.  Vaccine given today flu vaccine. Can get covid down stairs.  Recommend exercise and healthy diet.  We will let you know lab results as they come in.  Follow up date appointment will be determined after lab review.     For htn continue current meds. Bp decently controlled today and did not take meds.  Foot ulcer history. Wound healed and followed by Dr. Sharol Given. No longer on nitro patch.  Hx of CAD. Pt followed by cardiologist. No chest pain events since MI and surgery. Pt does not have nitro available.  For ED pt was on revatio in the past. Will send message to your cardiologist and NP about use.  Preventive Care 56-34 Years Old, Male Preventive care refers to lifestyle choices and visits with your health care provider that can promote health and wellness. This includes: A yearly physical exam. This is also called an annual wellness visit. Regular dental and eye exams. Immunizations. Screening for certain conditions. Healthy lifestyle choices, such as: Eating a healthy diet. Getting regular exercise. Not using drugs or products that contain nicotine and tobacco. Limiting alcohol use. What can I expect for my preventive care visit? Physical exam Your health care provider will check your: Height and weight. These may be used to calculate your BMI (body mass index). BMI is a measurement that tells if you are at a healthy weight. Heart rate and blood pressure. Body temperature. Skin for abnormal spots. Counseling Your health care provider may ask you questions about your: Past medical problems. Family's medical history. Alcohol, tobacco, and drug use. Emotional well-being. Home life and relationship well-being. Sexual activity. Diet, exercise, and sleep habits. Work and work Statistician. Access to firearms. What immunizations do I need? Vaccines are usually given at various ages, according to a  schedule. Your health care provider will recommend vaccines for you based on your age, medical history, and lifestyle or other factors, such as travel or where you work. What tests do I need? Blood tests Lipid and cholesterol levels. These may be checked every 5 years, or more often if you are over 40 years old. Hepatitis C test. Hepatitis B test. Screening Lung cancer screening. You may have this screening every year starting at age 61 if you have a 30-pack-year history of smoking and currently smoke or have quit within the past 15 years. Prostate cancer screening. Recommendations will vary depending on your family history and other risks. Genital exam to check for testicular cancer or hernias. Colorectal cancer screening. All adults should have this screening starting at age 73 and continuing until age 74. Your health care provider may recommend screening at age 60 if you are at increased risk. You will have tests every 1-10 years, depending on your results and the type of screening test. Diabetes screening. This is done by checking your blood sugar (glucose) after you have not eaten for a while (fasting). You may have this done every 1-3 years. STD (sexually transmitted disease) testing, if you are at risk. Follow these instructions at home: Eating and drinking  Eat a diet that includes fresh fruits and vegetables, whole grains, lean protein, and low-fat dairy products. Take vitamin and mineral supplements as recommended by your health care provider. Do not drink alcohol if your health care provider tells you not to drink. If you drink alcohol: Limit how much you have to 0-2 drinks a day. Be aware of how much alcohol  is in your drink. In the [place] one drink equals one 12 oz bottle of beer (355 mL), one 5 oz glass of wine (148 mL), or one 1 oz glass of hard liquor (44 mL). Lifestyle Take daily care of your teeth and gums. Brush your teeth every morning and night with fluoride  toothpaste. Floss one time each day. Stay active. Exercise for at least 30 minutes 5 or more days each week. Do not use any products that contain nicotine or tobacco, such as cigarettes, e-cigarettes, and chewing tobacco. If you need help quitting, ask your health care provider. Do not use drugs. If you are sexually active, practice safe sex. Use a condom or other form of protection to prevent STIs (sexually transmitted infections). If told by your health care provider, take low-dose aspirin daily starting at age 72. Find healthy ways to cope with stress, such as: Meditation, yoga, or listening to music. Journaling. Talking to a trusted person. Spending time with friends and family. Safety Always wear your seat belt while driving or riding in a vehicle. Do not drive: If you have been drinking alcohol. Do not ride with someone who has been drinking. When you are tired or distracted. While texting. Wear a helmet and other protective equipment during sports activities. If you have firearms in your house, make sure you follow all gun safety procedures. What's next? Go to your health care provider once a year for an annual wellness visit. Ask your health care provider how often you should have your eyes and teeth checked. Stay up to date on all vaccines. This information is not intended to replace advice given to you by your health care provider. Make sure you discuss any questions you have with your health care provider. Document Revised: 09/23/2020 Document Reviewed: 07/10/2018 Elsevier Patient Education  2022 [employer].

## 2021-05-01 NOTE — Progress Notes (Signed)
Subjective:    Patient ID: Joshua Branch, male    DOB: Apr 10, 1961, 60 y.o.   MRN: [id number]  HPI  Pt in for cpe/wellness. I has last seen pt 02-2020.    Pt is fasting. Pt states moderate healthy diet. States good do better. Less sweats/sugar. Has not been able to exercise.    Last a1c was was 6.9. Pt sees Dr. Renne Crigler metformin and farxiga. Pt saw endocrinologist about 1.5 month ago.  Pt has history of htn. Bp is mild high. He did not take his bp meds today.  Pt will get flu vaccine.  Pt has had 3 covid vaccines.    Pt  had eye exam since last month. Asking pt to get doctor to send report.  Colonoscopy done just recently.  Since last visit pt did have foot ulcer. In care everywhere showed visit at   Dr. Sharol Given preop surgery evaluation.  "PREOPERATIVE INDICATIONS:  Joshua Branch is a  60 y.o. male with a diagnosis of Osteomyelitis Left 3rd Metatarsal who failed conservative measures and elected for surgical management.     The risks benefits and alternatives were discussed with the patient preoperatively including but not limited to the risks of infection, bleeding, nerve injury, cardiopulmonary complications, the need for revision surgery, among others, and the patient was willing to proceed."  Pt was briefly on nitropatch for foot wound. No currently on.    Hx of ED and pt wants Cialis. Pt has hx of CAD. Pt sees Dr. Gwenlyn Found. Pt states just followed up with cardiologist.  Pt is on nitroglycerin. No hx of any recent chest pain since he was hospitalized years ago.  Pt saw [name] NP. Pt stats he does not have any chest pain and was not given nitroglycerin recently.    Review of Systems  Constitutional:  Negative for chills, fatigue and fever.  HENT:  Negative for congestion.   Respiratory:  Negative for cough, choking, shortness of breath and wheezing.   Cardiovascular:  Negative for chest pain and palpitations.  Gastrointestinal:  Negative for abdominal pain.   Endocrine: Negative for polydipsia, polyphagia and polyuria.  Genitourinary:  Negative for difficulty urinating, enuresis, flank pain and testicular pain.       ED.  Musculoskeletal:  Negative for back pain.  Skin:  Negative for rash.  Neurological:  Negative for dizziness, syncope, weakness, numbness and headaches.  Hematological:  Negative for adenopathy. Does not bruise/bleed easily.  Psychiatric/Behavioral:  Negative for behavioral problems, confusion, dysphoric mood and self-injury.    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   LIMA to LAD, SVG to Diag, SVG to OM2, SVG to [name], EVH via right thigh and leg   Type II diabetes mellitus with complication, uncontrolled ([place])    Uncontrolled type 2 diabetes mellitus (Joshua Branch)      Social History   Socioeconomic History   Marital  status: Married    Spouse name: Joshua Branch   Number of children: 2   Years of education: Not on file   Highest education level: Professional school degree (e.g., MD, DDS, DVM, JD)  Occupational History   Occupation: attorney  Tobacco Use   Smoking status: Never   Smokeless tobacco: Never  Vaping Use   Vaping Use: Never used  Substance and Sexual Activity   Alcohol use: Yes    Alcohol/week: 2.0 standard drinks    Types: 2 Cans of beer per week    Comment: 2 cans of beer or 2 borbon   Drug use: Never   Sexual activity: Not on file  Other Topics Concern   Not on file  Social History Narrative   Not on file   Social Determinants  of Health   Financial Resource Strain: Not on file  Food Insecurity: Not on file  Transportation Needs: Not on file  Physical Activity: Not on file  Stress: Not on file  Social Connections: Not on file  Intimate Partner Violence: Not on file    Past Surgical History:  Procedure Laterality Date   AMPUTATION Right 01/06/2020   Procedure: RIGHT FOOT 2ND RAY AMPUTATION;  Surgeon: Newt Minion, MD;  Location: [facility];  Service: Orthopedics;  Laterality: Right;   AMPUTATION Left 01/11/2021   Procedure: LEFT FOOT 3RD RAY AMPUTATION;  Surgeon: Newt Minion, MD;  Location: [facility];  Service: Orthopedics;  Laterality: Left;   COLONOSCOPY W/ POLYPECTOMY     CORONARY ARTERY BYPASS GRAFT N/A 06/25/2018   Procedure: CORONARY ARTERY BYPASS GRAFTING (CABG) TIMES FOUR USING LEFT INTERNAL MAMMARY ARTERY AND RIGHT ENDOSCOPICALLY HARVESTED SAPHENOUS VEIN;  Surgeon: Rexene Alberts, MD;  Location: [facility];  Service: Open Heart Surgery;  Laterality: N/A;   ENDOVEIN HARVEST OF GREATER SAPHENOUS VEIN Right 06/25/2018   Procedure: ENDOVEIN HARVEST OF GREATER SAPHENOUS VEIN;  Surgeon: Rexene Alberts, MD;  Location: [facility];  Service: Open Heart Surgery;  Laterality: Right;   LEFT HEART CATH AND CORONARY ANGIOGRAPHY N/A 06/22/2018   Procedure: LEFT HEART CATH AND CORONARY ANGIOGRAPHY;  Surgeon: Nigel Mormon, MD;  Location: [facility];  Service: Cardiovascular;  Laterality: N/A;   surgical debridement  Right    Right foot ulcer   TEE WITHOUT CARDIOVERSION N/A 06/25/2018   Procedure: TRANSESOPHAGEAL ECHOCARDIOGRAM (TEE);  Surgeon: Rexene Alberts, MD;  Location: [place];  Service: Open Heart Surgery;  Laterality: N/A;    Family History  Problem Relation Age of Onset   Diabetes Mother    Stroke Father    Hypertension Father    Hearing loss Father    Colon polyps Father    Colon cancer Neg Hx    Esophageal cancer Neg Hx    Prostate cancer Neg Hx     Allergies  Allergen Reactions   Apricot  Kernel Oil [Prunus] Swelling    THROAT   Cherry Swelling    THROAT   Jardiance [Empagliflozin] Diarrhea and Nausea And Vomiting   Other Other (See Comments)    Fruits with a pit. Throat swells up. Can have them if cooked    Peach [Prunus Persica] Swelling    THROAT   Plum Pulp Swelling    THROAT    Current Outpatient Medications on File Prior to Visit  Medication Sig Dispense Refill   aspirin EC 81 MG tablet Take 81 mg by mouth daily.     atorvastatin (LIPITOR) 80 MG tablet Take 1 tablet (80  mg total) by mouth daily. 90 tablet 3   Blood Glucose Monitoring Suppl (FREESTYLE LITE) DEVI      cetirizine (ZYRTEC) 10 MG tablet Take 1 tablet (10 mg total) by mouth daily. (Patient taking differently: Take 10 mg by mouth daily as needed for allergies.) 30 tablet 11   clopidogrel (PLAVIX) 75 MG tablet TAKE 1 TABLET (75 MG TOTAL) BY MOUTH DAILY. (Patient taking differently: Take 75 mg by mouth daily.) 90 tablet 3   Coenzyme Q10 (CO Q-10) 300 MG CAPS Take 300 mg by mouth daily.     dapagliflozin propanediol (FARXIGA) 10 MG TABS tablet Take 1 tablet (10 mg total) by mouth daily before breakfast. 90 tablet 3   glucose blood (FREESTYLE LITE) test strip USE AS INSTRUCTED TO CHECK 1-2X A DAY 100 strip 1   hydrochlorothiazide (MICROZIDE) 12.5 MG capsule TAKE 1 CAPSULE (12.5 MG TOTAL) BY MOUTH DAILY. 90 capsule 1   Lancets MISC Check sugars 3 times a day E11.9 100 each 0   lisinopril (ZESTRIL) 10 MG tablet TAKE 1 TABLET (10 MG TOTAL) BY MOUTH DAILY. 90 tablet 0   metFORMIN (GLUCOPHAGE) 500 MG tablet Take 1 tablet (500 mg total) by mouth 2 (two) times daily with a meal. 180 tablet 3   metoprolol tartrate (LOPRESSOR) 25 MG tablet TAKE 1 TABLET (25 MG TOTAL) BY MOUTH 2 (TWO) TIMES DAILY. (Patient taking differently: Take 25 mg by mouth 2 (two) times daily.) 180 tablet 3   Multiple Vitamin (MULTIVITAMIN WITH MINERALS) TABS tablet Take 1 tablet by mouth daily.     nitroGLYCERIN (NITRODUR - DOSED IN MG/24 HR) 0.2  mg/hr patch Place 1 patch (0.2 mg total) onto the skin daily. 30 patch 1   pentoxifylline (TRENTAL) 400 MG CR tablet Take 1 tablet (400 mg total) by mouth 3 (three) times daily with meals. 90 tablet 3   sildenafil (REVATIO) 20 MG tablet TAKE 2-5 TABLETS BY MOUTH ONE HOUR BEFORE SEX AS NEEDED **MAX OF 5 TABLETS WITHIN A 24 HOUR PERIOD** (Patient taking differently: Take 20-100 mg by mouth See admin instructions. Take 1 hour prior to sex as needed. Max of 100mg  within a 24 hour period.) 100 tablet 0   vitamin B-12 (CYANOCOBALAMIN) 1000 MCG tablet Take 1,000 mcg by mouth daily.     Current Facility-Administered Medications on File Prior to Visit  Medication Dose Route Frequency Provider Last Rate Last Admin   0.9 %  sodium chloride infusion  500 mL Intravenous Once Cirigliano, Vito V, DO        BP 130/87   Pulse 89   Resp 18   Ht 6' 2.5" (1.892 m)   Wt 239 lb (108.4 kg)   SpO2 96%   BMI 30.28 kg/m        Objective:   Physical Exam  General Mental Status- Alert. General Appearance- Not in acute distress.   Skin Scattered moles, seborrheic ketatosis type lesions.   Neck Carotid Arteries- Normal color. Moisture- Normal Moisture. No carotid bruits. No JVD.  Chest and Lung Exam Auscultation: Breath Sounds:-Normal.  Cardiovascular Auscultation:Rythm- Regular. Murmurs & Other Heart Sounds:Auscultation of the heart reveals- No Murmurs.  Abdomen Inspection:-Inspeection Normal. Palpation/Percussion:Note:No mass. Palpation and Percussion of the abdomen reveal- Non Tender, Non Distended + BS, no rebound or guarding.   Neurologic Cranial Nerve exam:- CN III-XII intact(No nystagmus), symmetric smile Strength:- 5/5 equal and symmetric strength both upper and lower extremities.       Assessment & Plan:   "For you wellness exam  today I have ordered cbc, cmp, psa and lipid panel.  Vaccine given today flu vaccine. Can get covid down stairs.  Recommend exercise and healthy  diet.  We will let you know lab results as they come in.  Follow up date appointment will be determined after lab review.     For htn continue current meds. Bp decently controlled today and did not take meds.  Foot ulcer history. Wound healed and followed by Dr. Sharol Given. No longer on nitro patch.  Hx of CAD. Pt followed by cardiologist. No chest pain events since MI and surgery. Pt does not have nitro available.  For ED pt was on revatio in the past. Will send message to your cardiologist and NP about use."  Mackie Pai, [place]   [id number] today as more than one year since last seen  and need address/review problem chronic problem list.

## 2021-05-02 ENCOUNTER — Other Ambulatory Visit (HOSPITAL_BASED_OUTPATIENT_CLINIC_OR_DEPARTMENT_OTHER): Payer: Self-pay

## 2021-05-08 ENCOUNTER — Other Ambulatory Visit (HOSPITAL_BASED_OUTPATIENT_CLINIC_OR_DEPARTMENT_OTHER): Payer: Self-pay

## 2021-05-08 ENCOUNTER — Encounter: Payer: Self-pay | Admitting: Medical

## 2021-05-09 ENCOUNTER — Other Ambulatory Visit (HOSPITAL_BASED_OUTPATIENT_CLINIC_OR_DEPARTMENT_OTHER): Payer: Self-pay

## 2021-05-09 MED ORDER — COVID-19MRNA BIVAL VACC PFIZER 30 MCG/0.3ML IM SUSP
INTRAMUSCULAR | 0 refills | Status: DC
  Filled 2021-05-09: qty 0.3, 1d supply, fill #0

## 2021-05-15 ENCOUNTER — Other Ambulatory Visit (HOSPITAL_BASED_OUTPATIENT_CLINIC_OR_DEPARTMENT_OTHER): Payer: Self-pay

## 2021-05-25 ENCOUNTER — Other Ambulatory Visit (HOSPITAL_COMMUNITY): Payer: Self-pay

## 2021-05-25 ENCOUNTER — Telehealth: Payer: Self-pay | Admitting: Pharmacy Technician

## 2021-05-25 NOTE — Telephone Encounter (Signed)
Patient Advocate Encounter  Received notification from [place] that prior authorization for [facility] 100MG  is required.   PA NOT NEEDED. TEST CLAIM SUBMITTED RESULTED IN $0 CO-PAY FOR 89 DAYS [place] [id number]  Luciano Cutter, CPhT Patient [facility] Phone: [phone] Fax:  [phone]

## 2021-05-29 ENCOUNTER — Other Ambulatory Visit: Payer: Self-pay | Admitting: Medical

## 2021-05-29 ENCOUNTER — Other Ambulatory Visit (HOSPITAL_BASED_OUTPATIENT_CLINIC_OR_DEPARTMENT_OTHER): Payer: Self-pay

## 2021-05-29 DIAGNOSIS — N528 Other male erectile dysfunction: Secondary | ICD-10-CM

## 2021-05-29 MED ORDER — LISINOPRIL 10 MG PO TABS
ORAL_TABLET | Freq: Every day | ORAL | 0 refills | Status: DC
  Filled 2021-05-29: qty 90, fill #0
  Filled 2021-05-30 – 2021-06-09 (×2): qty 90, 90d supply, fill #0

## 2021-05-29 MED ORDER — SILDENAFIL CITRATE 20 MG PO TABS
ORAL_TABLET | ORAL | 0 refills | Status: DC
  Filled 2021-05-29: qty 50, 10d supply, fill #0

## 2021-05-29 MED FILL — Clopidogrel Bisulfate Tab 75 MG (Base Equiv): ORAL | 90 days supply | Qty: 90 | Fill #2 | Status: AC

## 2021-05-29 MED FILL — Metoprolol Tartrate Tab 25 MG: ORAL | 90 days supply | Qty: 180 | Fill #2 | Status: AC

## 2021-05-30 ENCOUNTER — Other Ambulatory Visit (HOSPITAL_COMMUNITY): Payer: Self-pay

## 2021-05-30 ENCOUNTER — Other Ambulatory Visit (HOSPITAL_BASED_OUTPATIENT_CLINIC_OR_DEPARTMENT_OTHER): Payer: Self-pay

## 2021-05-30 MED ORDER — SHINGRIX 50 MCG/0.5ML IM SUSR
INTRAMUSCULAR | 1 refills | Status: DC
  Filled 2021-05-30: qty 0.5, 1d supply, fill #0

## 2021-05-30 MED ORDER — INFLUENZA VAC SPLIT QUAD 0.5 ML IM SUSY
PREFILLED_SYRINGE | INTRAMUSCULAR | 0 refills | Status: DC
  Filled 2021-05-30: qty 0.5, 1d supply, fill #0

## 2021-06-09 ENCOUNTER — Encounter: Payer: Self-pay | Admitting: Medical

## 2021-06-09 ENCOUNTER — Other Ambulatory Visit (HOSPITAL_BASED_OUTPATIENT_CLINIC_OR_DEPARTMENT_OTHER): Payer: Self-pay

## 2021-06-09 ENCOUNTER — Ambulatory Visit (INDEPENDENT_AMBULATORY_CARE_PROVIDER_SITE_OTHER): Payer: 59 | Admitting: Family

## 2021-06-09 ENCOUNTER — Telehealth: Payer: Self-pay | Admitting: Family

## 2021-06-09 ENCOUNTER — Encounter: Payer: Self-pay | Admitting: Family

## 2021-06-09 DIAGNOSIS — L03031 Cellulitis of right toe: Secondary | ICD-10-CM | POA: Diagnosis not present

## 2021-06-09 MED ORDER — SULFAMETHOXAZOLE-TRIMETHOPRIM 800-160 MG PO TABS
1.0000 | ORAL_TABLET | Freq: Two times a day (BID) | ORAL | 0 refills | Status: DC
  Filled 2021-06-09: qty 20, 10d supply, fill #0

## 2021-06-09 NOTE — Telephone Encounter (Signed)
Pt states he has an infection in his second left toe and asking to be seen today. Please call pt at [phone].

## 2021-06-09 NOTE — Telephone Encounter (Signed)
Please call patient and schedule him at 9:30 am today with Junie Panning.Thank you.

## 2021-06-09 NOTE — Progress Notes (Signed)
Office Visit Note   [name]           Date of Birth: [birth year]           MRN: [id number] Visit Date: 06/09/2021              Requested by: Mackie Pai, PA-C [place],  [place] [address] PCP: Mackie Pai, PA-C  No chief complaint on file.     HPI: The patient is a 60 year old gentleman seen today for concern of an infected second toe.  He is about 5 months out from third ray amputation.  He has resumed his regular shoewear.  He is today in the [place] walking sneaker unfortunately he feels that this is too tight in the toebox and has been rubbing over the dorsum of his second toe he noticed this began about a week ago had a callused area that just became red yesterday he has had scant drainage denies fevers or chills  Assessment & Plan: Visit Diagnoses: No diagnosis found.  Plan: We will place on a course of Bactrim we will follow-up in the office in 2 weeks discussed if he fails to improve prove in the next 3 days please call the office  Follow-Up Instructions: No follow-ups on file.   Ortho Exam  Patient is alert, oriented, no adenopathy, well-dressed, normal affect, normal respiratory effort. On examination of the right foot his second toe is clawed. he has ulceration over the IP joint with surrounding erythema of the second toe there is no sausage digit swelling he does have a palpable dorsalis pedis pulse there is no purulence no odor  Imaging: No results found. No images are attached to the encounter.  Labs: Lab Results  Component Value Date   HGBA1C 7.4 (A) 01/27/2021   HGBA1C 6.9 (H) 04/15/2020   HGBA1C 7.8 (H) 05/08/2019   REPTSTATUS 01/16/2021 FINAL 01/11/2021   CULT  01/11/2021    RARE METHICILLIN RESISTANT STAPHYLOCOCCUS AUREUS NO ANAEROBES ISOLATED Performed at [facility], [place] [address], [place], [place] [address]    LABORGA METHICILLIN RESISTANT STAPHYLOCOCCUS AUREUS 01/11/2021     Lab  Results  Component Value Date   ALBUMIN 4.3 05/01/2021   ALBUMIN 3.9 05/08/2019   ALBUMIN 2.7 (L) 06/23/2018    Lab Results  Component Value Date   MG 2.1 05/08/2019   MG 2.2 06/26/2018   MG 3.0 (H) 06/26/2018   No results found for: VD25OH  No results found for: PREALBUMIN CBC EXTENDED Latest Ref Rng & Units 05/01/2021 01/11/2021 04/15/2020  WBC 4.0 - 10.5 K/uL 8.6 13.3(H) 7.3  RBC 4.22 - 5.81 Mil/uL 5.48 4.62 4.42  HGB 13.0 - 17.0 g/dL 16.9 14.2 13.7  HCT 39.0 - 52.0 % 50.5 42.0 40.2  PLT 150.0 - 400.0 K/uL 251.0 330 261  NEUTROABS 1.4 - 7.7 K/uL 5.6 - 4,314  LYMPHSABS 0.7 - 4.0 K/uL 2.1 - 2,102     There is no height or weight on file to calculate BMI.  Orders:  No orders of the defined types were placed in this encounter.  No orders of the defined types were placed in this encounter.    Procedures: No procedures performed  Clinical Data: No additional findings.  ROS:  All other systems negative, except as noted in the HPI. Review of Systems  Constitutional:  Negative for chills and fever.  Skin:  Positive for color change and wound.   Objective: Vital Signs: There were  no vitals taken for this visit.  Specialty Comments:  No specialty comments available.  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   Acute coronary syndrome ([place]) 06/22/2018   Nonhealing skin ulcer ([place]) 06/22/2018   Coronary artery disease involving native coronary artery with unstable angina pectoris ([place]) 06/22/2018   Acute myocardial infarction ([place]) 06/22/2018   Hyperlipidemia    Active cochlear  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   Vertigo 08/20/2016   Myringotomy tube status 08/20/2016   Ear fullness, left 08/20/2016   Asymmetrical left sensorineural hearing loss 02/06/2016   Neuropathy of left foot 02/03/2015   Charcot's joint of left foot, non-diabetic 02/03/2015   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   Cramping of hands 06/02/2014   Eustachian tube dysfunction 06/02/2014   HTN (hypertension) 05/07/2014   Obstructive sleep apnea 05/07/2014   Past Medical History:  Diagnosis Date   Acute myocardial infarction ([place]) 06/22/2018   Allergy    Basal cell carcinoma    Charcot's joint of left foot, non-diabetic    Coronary artery disease involving native coronary artery with unstable angina pectoris ([place]) 06/22/2018   Diabetes ([place])    type 2   GERD (gastroesophageal reflux disease)    History of kidney stones    passed   Hyperlipidemia    Hypertension    Neuropathy of left foot    Pneumonia    As a child   Right foot ulcer ([place])    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   LIMA to LAD, SVG to Diag, SVG to OM2, SVG to [name], EVH via right thigh and leg   Type II diabetes mellitus with complication, uncontrolled    Uncontrolled type 2 diabetes mellitus     Family History  Problem Relation Age of Onset   Diabetes Mother    Stroke Father    Hypertension Father    Hearing loss Father    Colon polyps Father    Colon cancer Neg Hx    Esophageal cancer Neg Hx    Prostate cancer Neg Hx     Past Surgical History:  Procedure Laterality Date   AMPUTATION Right 01/06/2020   Procedure: RIGHT FOOT 2ND RAY AMPUTATION;  Surgeon: Newt Minion, MD;  Location: [facility];  Service: Orthopedics;  Laterality: Right;   AMPUTATION Left 01/11/2021   Procedure: LEFT FOOT 3RD RAY AMPUTATION;  Surgeon: Newt Minion, MD;  Location: [facility];  Service: Orthopedics;  Laterality: Left;   COLONOSCOPY W/ POLYPECTOMY     CORONARY ARTERY BYPASS GRAFT N/A 06/25/2018   Procedure: CORONARY ARTERY BYPASS GRAFTING  (CABG) TIMES FOUR USING LEFT INTERNAL MAMMARY ARTERY AND RIGHT ENDOSCOPICALLY HARVESTED SAPHENOUS VEIN;  Surgeon: Rexene Alberts, MD;  Location: [facility];  Service: Open Heart Surgery;  Laterality: N/A;   ENDOVEIN HARVEST OF GREATER SAPHENOUS VEIN Right 06/25/2018   Procedure: ENDOVEIN HARVEST OF GREATER SAPHENOUS VEIN;  Surgeon: Rexene Alberts, MD;  Location: [facility];  Service: Open Heart Surgery;  Laterality: Right;   LEFT HEART CATH AND CORONARY ANGIOGRAPHY N/A 06/22/2018  Procedure: LEFT HEART CATH AND CORONARY ANGIOGRAPHY;  Surgeon: Nigel Mormon, MD;  Location: [facility];  Service: Cardiovascular;  Laterality: N/A;   surgical debridement  Right    Right foot ulcer   TEE WITHOUT CARDIOVERSION N/A 06/25/2018   Procedure: TRANSESOPHAGEAL ECHOCARDIOGRAM (TEE);  Surgeon: Rexene Alberts, MD;  Location: [facility];  Service: Open Heart Surgery;  Laterality: N/A;   Social History   Occupational History   Occupation: attorney  Tobacco Use   Smoking status: Never   Smokeless tobacco: Never  Vaping Use   Vaping Use: Never used  Substance and Sexual Activity   Alcohol use: Yes    Alcohol/week: 2.0 standard drinks    Types: 2 Cans of beer per week    Comment: 2 cans of beer or 2 borbon   Drug use: Never   Sexual activity: Not on file

## 2021-06-12 ENCOUNTER — Other Ambulatory Visit: Payer: Self-pay | Admitting: Medical

## 2021-06-12 ENCOUNTER — Other Ambulatory Visit (HOSPITAL_BASED_OUTPATIENT_CLINIC_OR_DEPARTMENT_OTHER): Payer: Self-pay

## 2021-06-12 MED ORDER — LISINOPRIL 10 MG PO TABS
ORAL_TABLET | Freq: Every day | ORAL | 0 refills | Status: DC
  Filled 2021-06-12: qty 90, fill #0
  Filled 2021-09-18: qty 90, 90d supply, fill #0

## 2021-06-14 ENCOUNTER — Ambulatory Visit (INDEPENDENT_AMBULATORY_CARE_PROVIDER_SITE_OTHER): Payer: 59 | Admitting: Internal Medicine

## 2021-06-14 ENCOUNTER — Other Ambulatory Visit: Payer: Self-pay

## 2021-06-14 ENCOUNTER — Other Ambulatory Visit (HOSPITAL_BASED_OUTPATIENT_CLINIC_OR_DEPARTMENT_OTHER): Payer: Self-pay

## 2021-06-14 ENCOUNTER — Encounter: Payer: Self-pay | Admitting: Internal Medicine

## 2021-06-14 VITALS — BP 128/80 | HR 72 | Ht 74.5 in | Wt 237.6 lb

## 2021-06-14 DIAGNOSIS — E1159 Type 2 diabetes mellitus with other circulatory complications: Secondary | ICD-10-CM

## 2021-06-14 DIAGNOSIS — R200 Anesthesia of skin: Secondary | ICD-10-CM | POA: Diagnosis not present

## 2021-06-14 DIAGNOSIS — E782 Mixed hyperlipidemia: Secondary | ICD-10-CM

## 2021-06-14 DIAGNOSIS — E1165 Type 2 diabetes mellitus with hyperglycemia: Secondary | ICD-10-CM | POA: Diagnosis not present

## 2021-06-14 LAB — POCT GLYCOSYLATED HEMOGLOBIN (HGB A1C): Hemoglobin A1C: 7.3 % — AB (ref 4.0–5.6)

## 2021-06-14 MED ORDER — METFORMIN HCL 500 MG PO TABS
ORAL_TABLET | ORAL | 3 refills | Status: DC
  Filled 2021-06-14: qty 270, 90d supply, fill #0
  Filled 2021-06-14: qty 270, fill #0
  Filled 2021-08-01: qty 270, 90d supply, fill #0
  Filled 2021-12-27: qty 270, 90d supply, fill #1
  Filled 2022-04-20: qty 270, 90d supply, fill #2

## 2021-06-14 NOTE — Patient Instructions (Addendum)
Please increase: - Metformin 500 mg with b'fast and 1000 mg with dinner  Continue: - Farxiga 10 mg before breakfast  You can try: - Alpha Lipoic Acid 600 mcg 2x day  Please schedule an appt with Antonieta Iba with nutrition.  Please return in 4 months with your sugar log.

## 2021-06-14 NOTE — Progress Notes (Signed)
Patient ID: Joshua Branch, male   DOB: 21-Oct-1960, 60 y.o.   MRN: [id number]   This visit occurred during the SARS-CoV-2 public health emergency.  Safety protocols were in place, including screening questions prior to the visit, additional usage of staff PPE, and extensive cleaning of exam room while observing appropriate contact time as indicated for disinfecting solutions.   HPI: Joshua Branch is a 60 y.o.-year-old male, presenting for follow-up for DM2, dx before 2017, non-insulin-dependent, uncontrolled, with long-term complications (CAD - s/p AMI 05/2018- s/p CABG x4 06/2018, nonhealing skin ulcers >> toe amputations).  Last visit 4.5 months ago.  Interim history: No increased urination, blurry vision, nausea, chest pain. He has had an amputation 3rd L toe  - 12/2020. Now again inf. In L 2nd toe now >> for now, no need for amputations. He started exercise - 3-4 weeks ago.  Reviewed his HbA1c levels: Lab Results  Component Value Date   HGBA1C 7.4 (A) 01/27/2021   HGBA1C 6.9 (H) 04/15/2020   HGBA1C 7.8 (H) 05/08/2019   HGBA1C 8.2 (H) 06/23/2018   HGBA1C 11.7 (H) 01/17/2018   HGBA1C 10.8 (H) 12/07/2016   HGBA1C 9.2 (H) 11/30/2015   He is on: - Metformin 1000 mg 2x a day, with meals - started 12/2017 >> 500 mg 2x a day b/c felt poorly on the higher dose ("felt like my sugars were crashing") - Jardiance 10 mg in a.m.  >> abd. Pain, nausea, diarrhea >> Invokana 100 mg daily >>  Farxiga 10 mg daily before breakfast  Pt checks his sugars once a day:  - am:  120s >> 90s-125 >> 105-120, 165 >> 110-115 - 2h after b'fast: 130-160 >> 140-145 >> 130-175 >> n/c - before lunch:  - 2h after lunch: n/c - before dinner: n/c - 2h after dinner: 140-150 >> up to 175 >> 170-190 >> 175-185 - bedtime: n/c - nighttime: n/c Lowest sugar was 107 >> 95 >> 105 >> 110; it is unclear at which level he has hypoglycemia awareness Highest sugar was 181 >> 175 >> 165 >> 185.  Glucometer: True  Metrix  Pt's meals are: - Breakfast: protein shake, smoothie, yoghurt, occas. Eggs and bacon - Lunch: sandwich, chips - Dinner: chicken, veggie - Snacks: 3 a day He started to change his diet in 02/2018: He eliminated fast foods, eats less sweets, less alcohol.  -No CKD, last BUN/creatinine:  Lab Results  Component Value Date   BUN 23 05/01/2021   BUN 27 (H) 01/11/2021   CREATININE 1.15 05/01/2021   CREATININE 1.31 (H) 01/11/2021  On lisinopril 10.  -+ HL; last set of lipids: Lab Results  Component Value Date   CHOL 124 05/01/2021   HDL 35.00 (L) 05/01/2021   LDLCALC 59 04/15/2020   LDLDIRECT 52.0 05/01/2021   TRIG 229.0 (H) 05/01/2021   CHOLHDL 4 05/01/2021  On Lipitor 80.  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 Dr. Rex Kras at [facility].  -+ Numbness and tingling in his feet and numbness in fingertips  On ASA 81.  Pt has FH of DM in mother, sister.  He continues to have numbness in fingers.  B12 was normal: Lab Results  Component Value Date   VITAMINB12 236 05/08/2019   VITAMINB12 642 07/15/2018  We started a 1000 mcg daily B12 supplement.  ROS: + See HPI  I reviewed pt's medications, allergies, PMH, social hx, family hx, and changes were documented in the history  of present illness. Otherwise, unchanged from my initial visit note.  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   LIMA to LAD, SVG to Diag, SVG to OM2, SVG to [name], EVH  via right thigh and leg   Type II diabetes mellitus with complication, uncontrolled    Uncontrolled type 2 diabetes mellitus    Past Surgical History:  Procedure Laterality Date   AMPUTATION Right 01/06/2020   Procedure: RIGHT FOOT 2ND RAY AMPUTATION;  Surgeon: Newt Minion, MD;  Location: [facility];  Service: Orthopedics;  Laterality: Right;   AMPUTATION Left 01/11/2021   Procedure: LEFT FOOT 3RD RAY AMPUTATION;  Surgeon: Newt Minion, MD;  Location: [facility];  Service: Orthopedics;  Laterality: Left;   COLONOSCOPY W/ POLYPECTOMY     CORONARY ARTERY BYPASS GRAFT N/A 06/25/2018   Procedure: CORONARY ARTERY BYPASS GRAFTING (CABG) TIMES FOUR USING LEFT INTERNAL MAMMARY ARTERY AND RIGHT ENDOSCOPICALLY HARVESTED SAPHENOUS VEIN;  Surgeon: Rexene Alberts, MD;  Location: [facility];  Service: Open Heart Surgery;  Laterality: N/A;   ENDOVEIN HARVEST OF GREATER SAPHENOUS VEIN Right 06/25/2018   Procedure: ENDOVEIN HARVEST OF GREATER SAPHENOUS VEIN;  Surgeon: Rexene Alberts, MD;  Location: [facility];  Service: Open Heart Surgery;  Laterality: Right;   LEFT HEART CATH AND CORONARY ANGIOGRAPHY N/A 06/22/2018   Procedure: LEFT HEART CATH AND CORONARY ANGIOGRAPHY;  Surgeon: Nigel Mormon, MD;  Location: [facility];  Service: Cardiovascular;  Laterality: N/A;   surgical debridement  Right    Right foot ulcer   TEE WITHOUT CARDIOVERSION N/A 06/25/2018   Procedure: TRANSESOPHAGEAL ECHOCARDIOGRAM (TEE);  Surgeon: Rexene Alberts, MD;  Location: [facility];  Service: Open Heart Surgery;  Laterality: N/A;   Social History   Socioeconomic History   Marital status: Married    Spouse name: [name]   Number of children: 2   Years of education: Not on file   Highest education level: Professional school degree (e.g., MD, DDS, DVM, JD)  Occupational History   Occupation: attorney  Tobacco Use   Smoking status: Never   Smokeless tobacco: Never  Vaping Use   Vaping Use: Never used  Substance and Sexual  Activity   Alcohol use: Yes    Alcohol/week: 2.0 standard drinks    Types: 2 Cans of beer per week    Comment: 2 cans of beer or 2 borbon   Drug use: Never   Sexual activity: Not on file  Other Topics Concern   Not on file  Social History Narrative   Not on file   Social Determinants of Health   Financial Resource Strain: Not on file  Food Insecurity: Not on file  Transportation Needs: Not on file  Physical Activity: Not on file  Stress: Not on file  Social Connections: Not on file  Intimate Partner Violence: Not on file   Current Outpatient Medications on File  Prior to Visit  Medication Sig Dispense Refill   aspirin EC 81 MG tablet Take 81 mg by mouth daily.     atorvastatin (LIPITOR) 80 MG tablet Take 1 tablet (80 mg total) by mouth daily. 90 tablet 3   Blood Glucose Monitoring Suppl (FREESTYLE LITE) DEVI      cetirizine (ZYRTEC) 10 MG tablet Take 1 tablet (10 mg total) by mouth daily. (Patient taking differently: Take 10 mg by mouth daily as needed for allergies.) 30 tablet 11   clopidogrel (PLAVIX) 75 MG tablet TAKE 1 TABLET (75 MG TOTAL) BY MOUTH DAILY. (Patient taking differently: Take 75 mg by mouth daily.) 90 tablet 3   Coenzyme Q10 (CO Q-10) 300 MG CAPS Take 300 mg by mouth daily.     COVID-19 mRNA bivalent vaccine, Pfizer, injection Inject into the muscle. 0.3 mL 0   dapagliflozin propanediol (FARXIGA) 10 MG TABS tablet Take 1 tablet (10 mg total) by mouth daily before breakfast. 90 tablet 3   glucose blood (FREESTYLE LITE) test strip USE AS INSTRUCTED TO CHECK 1-2X A DAY 100 strip 1   hydrochlorothiazide (MICROZIDE) 12.5 MG capsule TAKE 1 CAPSULE (12.5 MG TOTAL) BY MOUTH DAILY. 90 capsule 1   influenza vac split quadrivalent PF (FLUARIX) 0.5 ML injection Inject into the muscle. 0.5 mL 0   Lancets MISC Check sugars 3 times a day E11.9 100 each 0   lisinopril (ZESTRIL) 10 MG tablet TAKE 1 TABLET (10 MG TOTAL) BY MOUTH DAILY. 90 tablet 0   metFORMIN (GLUCOPHAGE) 500 MG  tablet Take 1 tablet (500 mg total) by mouth 2 (two) times daily with a meal. 180 tablet 3   metoprolol tartrate (LOPRESSOR) 25 MG tablet TAKE 1 TABLET (25 MG TOTAL) BY MOUTH 2 (TWO) TIMES DAILY. (Patient taking differently: Take 25 mg by mouth 2 (two) times daily.) 180 tablet 3   Multiple Vitamin (MULTIVITAMIN WITH MINERALS) TABS tablet Take 1 tablet by mouth daily.     pentoxifylline (TRENTAL) 400 MG CR tablet Take 1 tablet (400 mg total) by mouth 3 (three) times daily with meals. 90 tablet 3   sildenafil (REVATIO) 20 MG tablet TAKE 2-5 TABLETS BY MOUTH ONE HOUR BEFORE SEX AS NEEDED **MAX OF 5 TABLETS WITHIN A 24 HOUR PERIOD** 50 tablet 0   sulfamethoxazole-trimethoprim (BACTRIM DS) 800-160 MG tablet Take 1 tablet by mouth 2 (two) times daily. 20 tablet 0   vitamin B-12 (CYANOCOBALAMIN) 1000 MCG tablet Take 1,000 mcg by mouth daily.     Zoster Vaccine Adjuvanted [facility]) injection Inject into the muscle. 0.5 mL 1   Current Facility-Administered Medications on File Prior to Visit  Medication Dose Route Frequency Provider Last Rate Last Admin   0.9 %  sodium chloride infusion  500 mL Intravenous Once Cirigliano, Vito V, DO       Allergies  Allergen Reactions   Apricot Kernel Oil [Prunus] Swelling    THROAT   Cherry Swelling    THROAT   Jardiance [Empagliflozin] Diarrhea and Nausea And Vomiting   Other Other (See Comments)    Fruits with a pit. Throat swells up. Can have them if cooked    Peach [Prunus Persica] Swelling    THROAT   Plum Pulp Swelling    THROAT   Family History  Problem Relation Age of Onset   Diabetes Mother    Stroke Father    Hypertension Father    Hearing loss Father    Colon polyps Father    Colon cancer Neg Hx  Esophageal cancer Neg Hx    Prostate cancer Neg Hx    PE: BP 128/80 (BP Location: Right Arm, Patient Position: Sitting, Cuff Size: Normal)   Pulse 72   Ht 6' 2.5" (1.892 m)   Wt 237 lb 9.6 oz (107.8 kg)   SpO2 97%   BMI 30.10 kg/m  Wt  Readings from Last 3 Encounters:  06/14/21 237 lb 9.6 oz (107.8 kg)  05/01/21 239 lb (108.4 kg)  04/14/21 240 lb (108.9 kg)   Constitutional: overweight, in NAD Eyes: PERRLA, EOMI, no exophthalmos ENT: moist mucous membranes, no thyromegaly, no cervical lymphadenopathy Cardiovascular: RRR, No MRG Respiratory: CTA B Musculoskeletal: strength intact in all 4 Skin: moist, warm, no rashes Neurological: no tremor with outstretched hands, DTR normal in all 4  ASSESSMENT: 1. DM2, non-insulin-dependent, uncontrolled, with long-term complications - CAD - s/p AMI - s/p CABG x4 06/2018-sees Dr. Gwenlyn Found - right foot second ray amputation 01/06/2020 - left foot third ray amputation 01/11/2021  2.  Numbness in fingers  3. HL  PLAN:  1. Patient with longstanding, uncontrolled, type 2 diabetes, on oral antidiabetic regimen with metformin and SGLT2 inhibitor, with still poor control.  At last visit, HbA1c was higher, at 7.4% but he has had HbA1c levels up to 11.7 percent in the past, before making dietary changes in 2019: Cutting down fast food, sweets, alcohol. -At last visit, she returned after having had an infection in his left toe for which she had to have this amputated.  We switched him from [place] to [place] at that time.  However, since then, he continues to have cellulitis - in another toe now.  However, no amputation needed for now. -At today's visit, his sugars are at goal in the morning but they are slightly higher than goal after dinner.  He is not checking sugars during the day to identify further trends.  I did suggest the freestyle libre CGM, but he would not necessarily want to go ahead with this.  -At today's visit, to improve his blood sugars after dinner, I advised him to increase metformin with dinner.  He also just got up to go to the gym 3 to 4 weeks ago and would like to continue with this.  I do expect his blood sugars to improve especially since he is also determined to work on his  diet.  He is interested in a referral to nutrition, which I placed today. - I suggested to:  Patient Instructions  Please increase: - Metformin 500 mg with b'fast and 1000 mg with dinner  Continue: - Farxiga 10 mg before breakfast  You can try: - Alpha Lipoic Acid 600 mcg 2x day  Please schedule an appt with Antonieta Iba with nutrition.  Please return in 4 months with your sugar log.   - we checked his HbA1c: 7.3% (slightly lower) - advised to check sugars at different times of the day - 1x a day, rotating check times - advised for yearly eye exams >> he is UTD - return to clinic in 4 months   2.  Numbness in fingertips -He had a low normal B12 level >> now on po supplementation -He continues on 1000 mcg B12 daily, but this is not helping significantly -I will recheck his B12 level now -I did suggest alpha-lipoic acid in the past but he forgot about this.  I again recommended it at this visit.  3. HL -Reviewed latest lipid panel from 04/2021: LDL at goal, transcribes high, HDL low: Lab  Results  Component Value Date   CHOL 124 05/01/2021   HDL 35.00 (L) 05/01/2021   LDLCALC 59 04/15/2020   LDLDIRECT 52.0 05/01/2021   TRIG 229.0 (H) 05/01/2021   CHOLHDL 4 05/01/2021  -He continues on Lipitor 80 mg daily without side effects  Philemon Kingdom, MD PhD [facility]

## 2021-06-20 ENCOUNTER — Other Ambulatory Visit (HOSPITAL_BASED_OUTPATIENT_CLINIC_OR_DEPARTMENT_OTHER): Payer: Self-pay

## 2021-07-05 ENCOUNTER — Other Ambulatory Visit (HOSPITAL_BASED_OUTPATIENT_CLINIC_OR_DEPARTMENT_OTHER): Payer: Self-pay

## 2021-07-06 DIAGNOSIS — C4441 Basal cell carcinoma of skin of scalp and neck: Secondary | ICD-10-CM | POA: Diagnosis not present

## 2021-08-01 ENCOUNTER — Other Ambulatory Visit (HOSPITAL_BASED_OUTPATIENT_CLINIC_OR_DEPARTMENT_OTHER): Payer: Self-pay

## 2021-08-01 ENCOUNTER — Other Ambulatory Visit: Payer: Self-pay | Admitting: Medical

## 2021-08-01 DIAGNOSIS — N528 Other male erectile dysfunction: Secondary | ICD-10-CM

## 2021-08-01 MED ORDER — SILDENAFIL CITRATE 20 MG PO TABS
ORAL_TABLET | ORAL | 1 refills | Status: DC
  Filled 2021-08-01: qty 50, 10d supply, fill #0
  Filled 2021-09-06: qty 50, 10d supply, fill #1

## 2021-08-07 ENCOUNTER — Other Ambulatory Visit: Payer: Self-pay | Admitting: Internal Medicine

## 2021-08-07 ENCOUNTER — Encounter: Payer: Self-pay | Admitting: Internal Medicine

## 2021-08-07 ENCOUNTER — Other Ambulatory Visit (HOSPITAL_BASED_OUTPATIENT_CLINIC_OR_DEPARTMENT_OTHER): Payer: Self-pay

## 2021-08-07 MED ORDER — GLIPIZIDE ER 5 MG PO TB24
5.0000 mg | ORAL_TABLET | Freq: Every day | ORAL | 3 refills | Status: DC
  Filled 2021-08-07 – 2021-08-09 (×2): qty 90, 90d supply, fill #0
  Filled 2021-11-03: qty 90, 90d supply, fill #1
  Filled 2022-02-26: qty 90, 90d supply, fill #2
  Filled 2022-05-18: qty 80, 80d supply, fill #3
  Filled 2022-05-18: qty 10, 10d supply, fill #3

## 2021-08-09 ENCOUNTER — Other Ambulatory Visit (HOSPITAL_BASED_OUTPATIENT_CLINIC_OR_DEPARTMENT_OTHER): Payer: Self-pay

## 2021-08-15 ENCOUNTER — Other Ambulatory Visit (HOSPITAL_BASED_OUTPATIENT_CLINIC_OR_DEPARTMENT_OTHER): Payer: Self-pay

## 2021-08-15 ENCOUNTER — Telehealth: Payer: Self-pay | Admitting: Medical

## 2021-08-15 ENCOUNTER — Encounter: Payer: Self-pay | Admitting: Medical

## 2021-08-15 DIAGNOSIS — N529 Male erectile dysfunction, unspecified: Secondary | ICD-10-CM

## 2021-08-15 MED ORDER — TADALAFIL 10 MG PO TABS
10.0000 mg | ORAL_TABLET | ORAL | 1 refills | Status: DC | PRN
  Filled 2021-08-15 – 2021-08-16 (×2): qty 10, 20d supply, fill #0
  Filled 2021-09-18 – 2021-09-21 (×2): qty 10, 20d supply, fill #1

## 2021-08-15 NOTE — Telephone Encounter (Signed)
Chart opened to rx med, order lab, review chart, respond to my chart message or send message to staff member  

## 2021-08-15 NOTE — Telephone Encounter (Signed)
Cialis sent to pharmacy.  Mackie Pai, PA-C

## 2021-08-16 ENCOUNTER — Other Ambulatory Visit (HOSPITAL_BASED_OUTPATIENT_CLINIC_OR_DEPARTMENT_OTHER): Payer: Self-pay

## 2021-09-04 ENCOUNTER — Other Ambulatory Visit: Payer: Self-pay | Admitting: Medical

## 2021-09-05 ENCOUNTER — Other Ambulatory Visit (HOSPITAL_BASED_OUTPATIENT_CLINIC_OR_DEPARTMENT_OTHER): Payer: Self-pay

## 2021-09-05 MED ORDER — HYDROCHLOROTHIAZIDE 12.5 MG PO CAPS
ORAL_CAPSULE | Freq: Every day | ORAL | 1 refills | Status: DC
  Filled 2021-09-05: qty 90, 90d supply, fill #0
  Filled 2021-11-30: qty 90, 90d supply, fill #1

## 2021-09-06 ENCOUNTER — Other Ambulatory Visit: Payer: Self-pay | Admitting: Cardiovascular Disease

## 2021-09-06 ENCOUNTER — Other Ambulatory Visit (HOSPITAL_BASED_OUTPATIENT_CLINIC_OR_DEPARTMENT_OTHER): Payer: Self-pay

## 2021-09-06 MED ORDER — CLOPIDOGREL BISULFATE 75 MG PO TABS
ORAL_TABLET | Freq: Every day | ORAL | 3 refills | Status: DC
  Filled 2021-09-06: qty 90, 90d supply, fill #0
  Filled 2021-11-30: qty 90, 90d supply, fill #1

## 2021-09-06 MED ORDER — METOPROLOL TARTRATE 25 MG PO TABS
ORAL_TABLET | Freq: Two times a day (BID) | ORAL | 3 refills | Status: DC
  Filled 2021-09-06: qty 180, 90d supply, fill #0
  Filled 2021-11-30: qty 180, 90d supply, fill #1
  Filled 2022-03-01: qty 180, 90d supply, fill #2
  Filled 2022-06-22: qty 180, 90d supply, fill #3

## 2021-09-18 ENCOUNTER — Other Ambulatory Visit: Payer: Self-pay | Admitting: Medical

## 2021-09-18 DIAGNOSIS — N528 Other male erectile dysfunction: Secondary | ICD-10-CM

## 2021-09-19 ENCOUNTER — Other Ambulatory Visit (HOSPITAL_BASED_OUTPATIENT_CLINIC_OR_DEPARTMENT_OTHER): Payer: Self-pay

## 2021-09-19 MED ORDER — SILDENAFIL CITRATE 20 MG PO TABS
ORAL_TABLET | ORAL | 1 refills | Status: DC
  Filled 2021-09-19: qty 50, 10d supply, fill #0
  Filled 2021-10-17: qty 50, 10d supply, fill #1

## 2021-09-21 ENCOUNTER — Other Ambulatory Visit (HOSPITAL_BASED_OUTPATIENT_CLINIC_OR_DEPARTMENT_OTHER): Payer: Self-pay

## 2021-10-03 ENCOUNTER — Other Ambulatory Visit (HOSPITAL_BASED_OUTPATIENT_CLINIC_OR_DEPARTMENT_OTHER): Payer: Self-pay

## 2021-10-03 ENCOUNTER — Other Ambulatory Visit: Payer: Self-pay | Admitting: Family

## 2021-10-03 ENCOUNTER — Telehealth: Payer: Self-pay | Admitting: Orthopedic Surgery

## 2021-10-03 MED ORDER — NITROGLYCERIN 0.2 MG/HR TD PT24
0.2000 mg | MEDICATED_PATCH | Freq: Every day | TRANSDERMAL | 12 refills | Status: DC
  Filled 2021-10-03 (×2): qty 30, 30d supply, fill #0

## 2021-10-03 MED ORDER — DOXYCYCLINE HYCLATE 100 MG PO TABS
100.0000 mg | ORAL_TABLET | Freq: Two times a day (BID) | ORAL | 0 refills | Status: DC
  Filled 2021-10-03 (×2): qty 28, 14d supply, fill #0

## 2021-10-03 NOTE — Telephone Encounter (Signed)
Called pt to sch an urgent appt this week per AF concerning a possible infection. Pt stated he wanted an appt for next week with Erin and wanted first thing in the morning. Sch'd pt for 10/10/21 with Erin at 8:45am.  ?

## 2021-10-10 ENCOUNTER — Ambulatory Visit: Payer: 59 | Admitting: Family

## 2021-10-10 ENCOUNTER — Encounter: Payer: Self-pay | Admitting: Family

## 2021-10-10 ENCOUNTER — Ambulatory Visit (INDEPENDENT_AMBULATORY_CARE_PROVIDER_SITE_OTHER): Payer: 59 | Admitting: Family

## 2021-10-10 DIAGNOSIS — L03031 Cellulitis of right toe: Secondary | ICD-10-CM

## 2021-10-10 NOTE — Progress Notes (Signed)
? ?Office Visit Note ?  ?[name]           ?Date of Birth: May 29, 1961           ?MRN: [id number] ?Visit Date: 10/10/2021 ?             ?Requested by: Mackie Pai, PA-C ?[address] ?[address] ?[place],  [place] [address] ?PCP: Mackie Pai, PA-C ? ?No chief complaint on file. ? ? ? ? ?HPI: ?The patient is a 61 year old gentleman who presents today for evaluation of ulcer with infection to his right second toe.  He was placed on a course of Bactrim on March 7.  He is quite pleased with the resolution of the redness and pain to his second toe.   ? ?Assessment & Plan: ?Visit Diagnoses: No diagnosis found. ? ?Plan: He will complete his Bactrim course.  Discussed a wide toe box and modifying his shoe wear.  Did offer a toe sleeve.  He will follow-up as needed ? ?Patient declined radiographs to evaluate for osteomyelitis. ? ?Follow-Up Instructions: Return if symptoms worsen or fail to improve.  ? ?Ortho Exam ? ?Patient is alert, oriented, no adenopathy, well-dressed, normal affect, normal respiratory effort. ?On examination of the right foot the third toe is surgically absent there is swelling without erythema without ulcer to his second toe there is fixed clawing of the second toe ?No warmth. ?Palpable dorsalis pedis pulse ? ?Imaging: ?No results found. ?No images are attached to the encounter. ? ?Labs: ?Lab Results  ?Component Value Date  ? HGBA1C 7.3 (A) 06/14/2021  ? HGBA1C 7.4 (A) 01/27/2021  ? HGBA1C 6.9 (H) 04/15/2020  ? REPTSTATUS 01/16/2021 FINAL 01/11/2021  ? CULT  01/11/2021  ?  RARE METHICILLIN RESISTANT STAPHYLOCOCCUS AUREUS ?NO ANAEROBES ISOLATED ?Performed at [facility], [place] [address] [place], [place] [address] ?  ? LABORGA METHICILLIN RESISTANT STAPHYLOCOCCUS AUREUS 01/11/2021  ? ? ? ?Lab Results  ?Component Value Date  ? ALBUMIN 4.3 05/01/2021  ? ALBUMIN 3.9 05/08/2019  ? ALBUMIN 2.7 (L) 06/23/2018  ? ? ?Lab Results  ?Component Value Date  ? MG 2.1 05/08/2019  ? MG 2.2  06/26/2018  ? MG 3.0 (H) 06/26/2018  ? ?No results found for: VD25OH ? ?No results found for: PREALBUMIN ?CBC EXTENDED Latest Ref Rng & Units 05/01/2021 01/11/2021 04/15/2020  ?WBC 4.0 - 10.5 K/uL 8.6 13.3(H) 7.3  ?RBC 4.22 - 5.81 Mil/uL 5.48 4.62 4.42  ?HGB 13.0 - 17.0 g/dL 16.9 14.2 13.7  ?HCT 39.0 - 52.0 % 50.5 42.0 40.2  ?PLT 150.0 - 400.0 K/uL 251.0 330 261  ?NEUTROABS 1.4 - 7.7 K/uL 5.6 - 4,314  ?LYMPHSABS 0.7 - 4.0 K/uL 2.1 - 2,102  ? ? ? ?There is no height or weight on file to calculate BMI. ? ?Orders:  ?No orders of the defined types were placed in this encounter. ? ?No orders of the defined types were placed in this encounter. ? ? ? Procedures: ?No procedures performed ? ?Clinical Data: ?No additional findings. ? ?ROS: ? ?All other systems negative, except as noted in the HPI. ?Review of Systems ? ?Objective: ?Vital Signs: There were no vitals taken for this visit. ? ?Specialty Comments:  ?No specialty comments available. ? ?PMFS History: ?Patient Active Problem List  ? Diagnosis Date Noted  ? 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 ([place]) 01/18/2021  ? Osteomyelitis of third toe of left foot ([place])   ? Cutaneous abscess of  left foot   ? Osteomyelitis of second toe of right foot ([place])   ? Poorly controlled type 2 diabetes mellitus with circulatory disorder ([place]) 07/15/2018  ? Numbness in both hands 07/15/2018  ? 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 Acute coronary syndrome ([place]) 06/22/2018  ? Nonhealing skin ulcer ([place]) 06/22/2018  ? Coronary artery disease involving native coronary artery with unstable angina pectoris ([place]) 06/22/2018  ? Acute myocardial infarction ([place]) 06/22/2018  ? Hyperlipidemia   ? 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 Vertigo 08/20/2016  ? Myringotomy tube status 08/20/2016  ? Ear fullness, left 08/20/2016  ? Asymmetrical left sensorineural hearing loss 02/06/2016  ? Neuropathy of left foot 02/03/2015  ? Charcot's joint of left foot, non-diabetic  02/03/2015  ? 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 Cramping of hands 06/02/2014  ? Eustachian tube dysfunction 06/02/2014  ? HTN (hypertension) 05/07/2014  ? Obstructive sleep apnea 05/07/2014  ? ?Past Medical History:  ?Diagnosis Date  ? Acute myocardial infarction ([place]) 06/22/2018  ? Allergy   ? Basal cell carcinoma   ? Charcot's joint of left foot, non-diabetic   ? Coronary artery disease involving native coronary artery with unstable angina pectoris ([place]) 06/22/2018  ? Diabetes ([place])   ? type 2  ? GERD (gastroesophageal reflux disease)   ? History of kidney stones   ? passed  ? Hyperlipidemia   ? Hypertension   ? Neuropathy of left foot   ? Pneumonia   ? As a child  ? Right foot ulcer ([place])   ? 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 LIMA to LAD, SVG to Diag, SVG to OM2, SVG to [name], EVH via right thigh and leg  ? Type II diabetes mellitus with complication, uncontrolled   ? Uncontrolled type 2 diabetes mellitus   ?  ?Family History  ?Problem Relation Age of Onset  ? Diabetes Mother   ? Stroke Father   ? Hypertension Father   ? Hearing loss Father   ? Colon polyps Father   ? Colon cancer Neg Hx   ? Esophageal cancer Neg Hx   ? Prostate cancer Neg Hx   ?  ?Past Surgical History:  ?Procedure Laterality Date  ? AMPUTATION Right 01/06/2020  ? Procedure: RIGHT FOOT 2ND RAY AMPUTATION;  Surgeon: Newt Minion, MD;  Location: [facility];  Service: Orthopedics;  Laterality: Right;  ? AMPUTATION Left 01/11/2021  ? Procedure: LEFT FOOT 3RD RAY AMPUTATION;  Surgeon: Newt Minion, MD;  Location: [facility];  Service: Orthopedics;  Laterality: Left;  ? COLONOSCOPY W/ POLYPECTOMY    ? CORONARY ARTERY BYPASS GRAFT N/A 06/25/2018  ? Procedure: CORONARY ARTERY BYPASS GRAFTING (CABG) TIMES FOUR USING LEFT INTERNAL MAMMARY ARTERY AND RIGHT ENDOSCOPICALLY HARVESTED SAPHENOUS VEIN;  Surgeon: Rexene Alberts, MD;  Location: [facility];  Service: Open Heart Surgery;  Laterality: N/A;  ? ENDOVEIN HARVEST OF GREATER SAPHENOUS VEIN Right 06/25/2018  ? Procedure:  ENDOVEIN HARVEST OF GREATER SAPHENOUS VEIN;  Surgeon: Rexene Alberts, MD;  Location: [facility];  Service: Open Heart Surgery;  Laterality: Right;  ? LEFT HEART CATH AND CORONARY ANGIOGRAPHY N/A 06/22/2018  ? Procedure: LEFT HEART CATH AND CORONARY ANGIOGRAPHY;  Surgeon: Nigel Mormon, MD;  Location: [facility];  Service: Cardiovascular;  Laterality: N/A;  ? surgical debridement  Right   ? Right foot ulcer  ? TEE WITHOUT CARDIOVERSION N/A 06/25/2018  ? Procedure: TRANSESOPHAGEAL ECHOCARDIOGRAM (TEE);  Surgeon: Rexene Alberts, MD;  Location[facility];  Service: Open Heart Surgery;  Laterality: N/A;  ? ?Social History  ? ?Occupational History  ? Occupation: attorney  ?Tobacco Use  ? Smoking status: Never  ? Smokeless tobacco: Never  ?Vaping Use  ? Vaping Use: Never used  ?Substance and Sexual Activity  ? Alcohol use: Yes  ?  Alcohol/week: 2.0 standard drinks  ?  Types: 2 Cans of beer per week  ?  Comment: 2 cans of beer or 2 borbon  ? Drug use: Never  ? Sexual activity: Not on file  ? ? ? ? ? ?

## 2021-10-17 ENCOUNTER — Other Ambulatory Visit (HOSPITAL_BASED_OUTPATIENT_CLINIC_OR_DEPARTMENT_OTHER): Payer: Self-pay

## 2021-10-17 ENCOUNTER — Other Ambulatory Visit: Payer: Self-pay | Admitting: Medical

## 2021-10-17 MED ORDER — TADALAFIL 10 MG PO TABS
10.0000 mg | ORAL_TABLET | ORAL | 1 refills | Status: DC | PRN
  Filled 2021-10-17 (×2): qty 10, 20d supply, fill #0
  Filled 2021-11-30: qty 10, 20d supply, fill #1

## 2021-10-20 ENCOUNTER — Other Ambulatory Visit (HOSPITAL_COMMUNITY): Payer: Self-pay

## 2021-11-03 ENCOUNTER — Ambulatory Visit
Admission: EM | Admit: 2021-11-03 | Discharge: 2021-11-03 | Disposition: A | Discharge status: Home / Self Care | Payer: Self-pay | Attending: Emergency Medicine | Admitting: Emergency Medicine

## 2021-11-03 ENCOUNTER — Other Ambulatory Visit (HOSPITAL_BASED_OUTPATIENT_CLINIC_OR_DEPARTMENT_OTHER): Payer: Self-pay

## 2021-11-03 DIAGNOSIS — J3089 Other allergic rhinitis: Secondary | ICD-10-CM

## 2021-11-03 DIAGNOSIS — J302 Other seasonal allergic rhinitis: Secondary | ICD-10-CM

## 2021-11-03 DIAGNOSIS — J4521 Mild intermittent asthma with (acute) exacerbation: Secondary | ICD-10-CM

## 2021-11-03 MED ORDER — ALBUTEROL SULFATE HFA 108 (90 BASE) MCG/ACT IN AERS
2.0000 | INHALATION_SPRAY | Freq: Four times a day (QID) | RESPIRATORY_TRACT | 0 refills | Status: DC | PRN
  Filled 2021-11-03: qty 18, 25d supply, fill #0

## 2021-11-03 MED ORDER — ALBUTEROL SULFATE (2.5 MG/3ML) 0.083% IN NEBU
2.5000 mg | INHALATION_SOLUTION | Freq: Once | RESPIRATORY_TRACT | Status: AC
  Administered 2021-11-03: 2.5 mg via RESPIRATORY_TRACT

## 2021-11-03 MED ORDER — AEROCHAMBER PLUS FLO-VU LARGE MISC
1.0000 | Freq: Once | 0 refills | Status: AC
  Filled 2021-11-03: qty 1, 1d supply, fill #0

## 2021-11-03 NOTE — Discharge Instructions (Addendum)
I am glad that you had significant relief after the albuterol treatment you received in the office today.  Please begin albuterol 2 puffs 4 times daily as needed for cough and increased work of breathing, please do this for the next 3 to 4 days.  After your work of breathing and cough has improved, you can decrease to twice daily and then to as needed only.  After your treatment, your lungs were completely clear and there was no evidence of pneumonia or wheezing.  I do not believe that any other medications are indicated at this time other than you are continuing to use Zyrtec and your nasal steroid daily throughout the entire spring season and possibly a week or 2 longer. ? ?It was a pleasure to meet you today.  Thank you for visiting urgent care today.  I appreciate the opportunity to participate in your care. ?

## 2021-11-03 NOTE — ED Provider Notes (Signed)
?[facility] ? ? ? ?CSN: [id number] ?Arrival date & time: 11/03/21  8786 ?  ? ?HISTORY  ? ?Chief Complaint  ?Patient presents with  ? Sore Throat  ? Nasal Congestion  ? ?HPI ?Joshua Branch is a 61 y.o. male. Patient complains of scratchy throat and cough that feels like it is in his chest.  Patient states the cough is mildly productive of small amounts of sputum, is not persistent and does not cause him to feel short of breath.  Patient reports a history of allergies for which he takes Zyrtec daily.  Patient denies nasal congestion, otalgia, hearing loss, fever, aches, chills, headache.  Patient endorses clear nasal drainage and pressure in his ears which are constant due to his known allergies but no worse today than usual.  Patient denies history of asthma.  Patient states he did have heart surgery and was advised to use albuterol during his acute postop recovery but has not been prescribed albuterol for any other reason. ? ?The history is provided by the patient.  ?Past Medical History:  ?Diagnosis Date  ? Acute myocardial infarction ([place]) 06/22/2018  ? Allergy   ? Basal cell carcinoma   ? Charcot's joint of left foot, non-diabetic   ? Coronary artery disease involving native coronary artery with unstable angina pectoris ([place]) 06/22/2018  ? Diabetes ([place])   ? type 2  ? GERD (gastroesophageal reflux disease)   ? History of kidney stones   ? passed  ? Hyperlipidemia   ? Hypertension   ? Neuropathy of left foot   ? Pneumonia   ? As a child  ? Right foot ulcer ([place])   ? 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 LIMA to LAD, SVG to Diag, SVG to OM2, SVG to [name], EVH via right thigh and leg  ? Type II diabetes mellitus with complication, uncontrolled   ? Uncontrolled type 2 diabetes mellitus   ? ?Patient Active Problem List  ? Diagnosis Date Noted  ? 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 Osteomyelitis of third toe of left foot ([place])   ? Cutaneous abscess of left foot   ?  Osteomyelitis of second toe of right foot ([place])   ? Poorly controlled type 2 diabetes mellitus with circulatory disorder ([place]) 07/15/2018  ? Numbness in both hands 07/15/2018  ? 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 Acute coronary syndrome ([place]) 06/22/2018  ? Nonhealing skin ulcer ([place]) 06/22/2018  ? Coronary artery disease involving native coronary artery with unstable angina pectoris ([place]) 06/22/2018  ? Acute myocardial infarction ([place]) 06/22/2018  ? Hyperlipidemia   ? 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 Vertigo 08/20/2016  ? Myringotomy tube status 08/20/2016  ? Ear fullness, left 08/20/2016  ? Asymmetrical left sensorineural hearing loss 02/06/2016  ? Neuropathy of left foot 02/03/2015  ? Charcot's joint of left foot, non-diabetic 02/03/2015  ? 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 Cramping of hands 06/02/2014  ? Eustachian tube dysfunction 06/02/2014  ? HTN (hypertension) 05/07/2014  ? Obstructive sleep apnea 05/07/2014  ? ?Past Surgical History:  ?Procedure Laterality Date  ? AMPUTATION Right 01/06/2020  ? Procedure: RIGHT FOOT 2ND RAY AMPUTATION;  Surgeon: Newt Minion, MD;  Location: [facility];  Service: Orthopedics;  Laterality: Right;  ? AMPUTATION Left 01/11/2021  ? Procedure: LEFT FOOT 3RD RAY AMPUTATION;  Surgeon: Newt Minion, MD;  Location: [place] [facility];  Service: Orthopedics;  Laterality: Left;  ?  COLONOSCOPY W/ POLYPECTOMY    ? CORONARY ARTERY BYPASS GRAFT N/A 06/25/2018  ? Procedure: CORONARY ARTERY BYPASS GRAFTING (CABG) TIMES FOUR USING LEFT INTERNAL MAMMARY ARTERY AND RIGHT ENDOSCOPICALLY HARVESTED SAPHENOUS VEIN;  Surgeon: Rexene Alberts, MD;  Location: [place] [facility];  Service: Open Heart Surgery;  Laterality: N/A;  ? ENDOVEIN HARVEST OF GREATER SAPHENOUS VEIN Right 06/25/2018  ? Procedure: ENDOVEIN HARVEST OF GREATER SAPHENOUS VEIN;  Surgeon: Rexene Alberts, MD;  Location: [place];  Service: Open Heart Surgery;  Laterality: Right;  ? LEFT HEART CATH AND CORONARY ANGIOGRAPHY N/A 06/22/2018  ?  Procedure: LEFT HEART CATH AND CORONARY ANGIOGRAPHY;  Surgeon: Nigel Mormon, MD;  Location: [facility];  Service: Cardiovascular;  Laterality: N/A;  ? surgical debridement  Right   ? Right foot ulcer  ? TEE WITHOUT CARDIOVERSION N/A 06/25/2018  ? Procedure: TRANSESOPHAGEAL ECHOCARDIOGRAM (TEE);  Surgeon: Rexene Alberts, MD;  Location: [facility];  Service: Open Heart Surgery;  Laterality: N/A;  ? ? ?Home Medications   ? ?Prior to Admission medications   ?Medication Sig Start Date End Date Taking? Authorizing Provider  ?aspirin EC 81 MG tablet Take 81 mg by mouth daily.    [provider]  ?atorvastatin (LIPITOR) 80 MG tablet Take 1 tablet (80 mg total) by mouth daily. 03/20/21 03/20/22  Lorretta Harp, MD  ?Blood Glucose Monitoring Suppl (FREESTYLE LITE) [facility]  07/15/18   [provider]  ?cetirizine (ZYRTEC) 10 MG tablet Take 1 tablet (10 mg total) by mouth daily. ?Patient taking differently: Take 10 mg by mouth daily as needed for allergies. 10/03/18   Saguier, Percell Miller, PA-C  ?clopidogrel (PLAVIX) 75 MG tablet TAKE 1 TABLET (75 MG TOTAL) BY MOUTH DAILY. 09/06/21 09/06/22  Lorretta Harp, MD  ?Coenzyme Q10 (CO Q-10) 300 MG CAPS Take 300 mg by mouth daily.    [provider]  ?COVID-19 mRNA bivalent vaccine, Pfizer, injection Inject into the muscle. 05/01/21   Carlyle Basques, MD  ?dapagliflozin propanediol (FARXIGA) 10 MG TABS tablet Take 1 tablet (10 mg total) by mouth daily before breakfast. 01/27/21   Philemon Kingdom, MD  ?doxycycline (VIBRA-TABS) 100 MG tablet Take 1 tablet (100 mg total) by mouth 2 (two) times daily. 10/03/21   Suzan Slick, NP  ?glipiZIDE (GLUCOTROL XL) 5 MG 24 hr tablet Take 1 tablet (5 mg total) by mouth daily before breakfast. 08/07/21   Philemon Kingdom, MD  ?glucose blood (FREESTYLE LITE) test strip USE AS INSTRUCTED TO CHECK 1-2X A DAY 01/18/20   Philemon Kingdom, MD  ?hydrochlorothiazide (MICROZIDE) 12.5 MG capsule TAKE 1 CAPSULE (12.5 MG TOTAL) BY  MOUTH DAILY. 09/05/21 09/05/22  Saguier, Percell Miller, PA-C  ?influenza vac split quadrivalent PF (FLUARIX) 0.5 ML injection Inject into the muscle. 05/30/21   Carlyle Basques, MD  ?Lancets MISC Check sugars 3 times a day E11.9 07/03/18   Saguier, Percell Miller, PA-C  ?lisinopril (ZESTRIL) 10 MG tablet TAKE 1 TABLET (10 MG TOTAL) BY MOUTH DAILY. 06/12/21 06/12/22  Saguier, Percell Miller, PA-C  ?metFORMIN (GLUCOPHAGE) 500 MG tablet Take 3 tablets by mouth daily as advised 06/14/21   Philemon Kingdom, MD  ?metoprolol tartrate (LOPRESSOR) 25 MG tablet TAKE 1 TABLET (25 MG TOTAL) BY MOUTH 2 (TWO) TIMES DAILY. 09/06/21 09/06/22  Lorretta Harp, MD  ?Multiple Vitamin (MULTIVITAMIN WITH MINERALS) TABS tablet Take 1 tablet by mouth daily.    [provider]  ?nitroGLYCERIN (NITRODUR - DOSED IN MG/24 HR) 0.2 mg/hr patch Place 1 patch (0.2 mg total)  onto the skin daily. 10/03/21   Suzan Slick, NP  ?pentoxifylline (TRENTAL) 400 MG CR tablet Take 1 tablet (400 mg total) by mouth 3 (three) times daily with meals. 02/21/21   Suzan Slick, NP  ?sildenafil (REVATIO) 20 MG tablet TAKE 2-5 TABLETS BY MOUTH ONE HOUR BEFORE SEX AS NEEDED **MAX OF 5 TABLETS WITHIN A 24 HOUR PERIOD** 09/19/21   Saguier, Percell Miller, PA-C  ?tadalafil (CIALIS) 10 MG tablet Take 1 tablet (10 mg total) by mouth every other day as needed for erectile dysfunction. 10/17/21   Saguier, Percell Miller, PA-C  ?vitamin B-12 (CYANOCOBALAMIN) 1000 MCG tablet Take 1,000 mcg by mouth daily.    [provider]  ?Zoster Vaccine Adjuvanted [facility]) injection Inject into the muscle. 05/30/21   Carlyle Basques, MD  ? ?Family History ?Family History  ?Problem Relation Age of Onset  ? Diabetes Mother   ? Stroke Father   ? Hypertension Father   ? Hearing loss Father   ? Colon polyps Father   ? Colon cancer Neg Hx   ? Esophageal cancer Neg Hx   ? Prostate cancer Neg Hx   ? ?Social History ?Social History  ? ?Tobacco Use  ? Smoking status: Never  ? Smokeless tobacco: Never  ?Vaping Use  ? Vaping  Use: Never used  ?Substance Use Topics  ? Alcohol use: Yes  ?  Alcohol/week: 2.0 standard drinks  ?  Types: 2 Cans of beer per week  ?  Comment: 2 cans of beer or 2 borbon  ? Drug use: Never  ? ?Allergies   ?Apricot k

## 2021-11-03 NOTE — ED Triage Notes (Signed)
Pt c/o sore throat and chest congestion that began this week.  ?

## 2021-11-06 ENCOUNTER — Encounter: Payer: Self-pay | Admitting: Medical

## 2021-11-21 ENCOUNTER — Ambulatory Visit (INDEPENDENT_AMBULATORY_CARE_PROVIDER_SITE_OTHER): Payer: 59 | Admitting: Medical

## 2021-11-21 ENCOUNTER — Ambulatory Visit (HOSPITAL_BASED_OUTPATIENT_CLINIC_OR_DEPARTMENT_OTHER)
Admission: RE | Admit: 2021-11-21 | Discharge: 2021-11-21 | Disposition: A | Discharge status: Home / Self Care | Payer: 59 | Source: Ambulatory Visit | Attending: Medical | Admitting: Medical

## 2021-11-21 ENCOUNTER — Other Ambulatory Visit (HOSPITAL_BASED_OUTPATIENT_CLINIC_OR_DEPARTMENT_OTHER): Payer: Self-pay

## 2021-11-21 VITALS — BP 124/78 | HR 90 | Temp 97.3°F | Resp 18 | Ht 74.5 in | Wt 246.0 lb

## 2021-11-21 DIAGNOSIS — R059 Cough, unspecified: Secondary | ICD-10-CM | POA: Insufficient documentation

## 2021-11-21 DIAGNOSIS — R062 Wheezing: Secondary | ICD-10-CM

## 2021-11-21 DIAGNOSIS — R6883 Chills (without fever): Secondary | ICD-10-CM | POA: Insufficient documentation

## 2021-11-21 DIAGNOSIS — M79672 Pain in left foot: Secondary | ICD-10-CM

## 2021-11-21 DIAGNOSIS — L089 Local infection of the skin and subcutaneous tissue, unspecified: Secondary | ICD-10-CM

## 2021-11-21 LAB — CBC WITH DIFFERENTIAL/PLATELET
Basophils Absolute: 0 10*3/uL (ref 0.0–0.1)
Basophils Relative: 0.4 % (ref 0.0–3.0)
Eosinophils Absolute: 0.1 10*3/uL (ref 0.0–0.7)
Eosinophils Relative: 0.7 % (ref 0.0–5.0)
HCT: 42.1 % (ref 39.0–52.0)
Hemoglobin: 14 g/dL (ref 13.0–17.0)
Lymphocytes Relative: 9.7 % — ABNORMAL LOW (ref 12.0–46.0)
Lymphs Abs: 1.1 10*3/uL (ref 0.7–4.0)
MCHC: 33.3 g/dL (ref 30.0–36.0)
MCV: 93.9 fl (ref 78.0–100.0)
Monocytes Absolute: 1 10*3/uL (ref 0.1–1.0)
Monocytes Relative: 8.8 % (ref 3.0–12.0)
Neutro Abs: 9 10*3/uL — ABNORMAL HIGH (ref 1.4–7.7)
Neutrophils Relative %: 80.4 % — ABNORMAL HIGH (ref 43.0–77.0)
Platelets: 251 10*3/uL (ref 150.0–400.0)
RBC: 4.49 Mil/uL (ref 4.22–5.81)
RDW: 12.6 % (ref 11.5–15.5)
WBC: 11.1 10*3/uL — ABNORMAL HIGH (ref 4.0–10.5)

## 2021-11-21 MED ORDER — CEFTRIAXONE SODIUM 1 G IJ SOLR
1.0000 g | Freq: Once | INTRAMUSCULAR | Status: AC
  Administered 2021-11-21: 1 g via INTRAMUSCULAR

## 2021-11-21 MED ORDER — LEVOCETIRIZINE DIHYDROCHLORIDE 5 MG PO TABS
5.0000 mg | ORAL_TABLET | Freq: Every evening | ORAL | 3 refills | Status: DC
  Filled 2021-11-21: qty 30, 30d supply, fill #0
  Filled 2021-12-27: qty 30, 30d supply, fill #1

## 2021-11-21 MED ORDER — BUDESONIDE-FORMOTEROL FUMARATE 160-4.5 MCG/ACT IN AERO
2.0000 | INHALATION_SPRAY | Freq: Two times a day (BID) | RESPIRATORY_TRACT | 1 refills | Status: DC
  Filled 2021-11-21: qty 10.2, 30d supply, fill #0

## 2021-11-21 MED ORDER — BENZONATATE 100 MG PO CAPS
100.0000 mg | ORAL_CAPSULE | Freq: Three times a day (TID) | ORAL | 0 refills | Status: DC | PRN
  Filled 2021-11-21: qty 30, 10d supply, fill #0

## 2021-11-21 MED ORDER — SULFAMETHOXAZOLE-TRIMETHOPRIM 800-160 MG PO TABS
1.0000 | ORAL_TABLET | Freq: Two times a day (BID) | ORAL | 0 refills | Status: DC
  Filled 2021-11-21: qty 20, 10d supply, fill #0

## 2021-11-21 NOTE — Addendum Note (Signed)
Addended by: Kem Boroughs D on: 11/21/2021 12:13 PM ? ? Modules accepted: Orders ? ?

## 2021-11-21 NOTE — Patient Instructions (Addendum)
Intermittent wheezing and dry cough recently after going on a long hike/exposure to pollen.  Also some wheezing since urgent care visit as well.  Prescribing Symbicort inhaler to use 2 relations twice daily.  Use albuterol inhaler as backup if needed.  Prescribing benzonatate for cough.  Making Xyzal antihistamine available to use daily as do expect recent pollen exposure playing a role. ? ?With recent chills and respiratory symptoms decided to get chest x-ray to rule out walking pneumonia. ? ?Also left foot redness and swelling.  Looks like callus on the bottom of the foot and blister on the lateral aspect just below pinky toe.  With history of diabetes and prior amputations concern for foot infection.  Will get stat CBC and lactic acid.  I will also get left foot x-ray stat. ? ?After review of studies might recommend ED evaluation versus quick referral to your orthopedics office. ? ?Pending lab and imaging review we will go ahead and give patient 1 g Rocephin in the office.  Then decide on oral antibiotic after study review. ? ?For diabetes would ask that you keep checking blood sugar daily.  But also go ahead and schedule follow-up with your endocrinologist as it has been sometime since her last A1c. ? ?We will update you later on lab and imaging review. ?

## 2021-11-21 NOTE — Progress Notes (Signed)
? ?Subjective:  ? ? Patient ID: Joshua Branch, male    DOB: 26-Aug-1960, 61 y.o.   MRN: [id number] ? ?HPI ?Pt in for evaluation. ? ?Pt states he went to [facility] urgent care.  ? ?Pt went on  Arrival date & time: 11/03/21   ? ?Chief Complaint  ?Patient presents with  ? Sore Throat  ? Nasal Congestion  ?  ?HPI ?Joshua Branch is a 61 y.o. male. Patient complains of scratchy throat and cough that feels like it is in his chest.  Patient states the cough is mildly productive of small amounts of sputum, is not persistent and does not cause him to feel short of breath.  Patient reports a history of allergies for which he takes Zyrtec daily.  Patient denies nasal congestion, otalgia, hearing loss, fever, aches, chills, headache.  Patient endorses clear nasal drainage and pressure in his ears which are constant due to his known allergies but no worse today than usual.  Patient denies history of asthma.  Patient states he did have heart surgery and was advised to use albuterol during his acute postop recovery but has not been prescribed albuterol for any other reason. ? ?On exam at urgent care he was wheezing ? ? ?Disposition on DC. ? ?"Disposition Upon Discharge:  ?Condition: stable for discharge home ?Home: take medications as prescribed; routine discharge instructions as discussed; follow up as advised. ?  ?Patient presented with an acute illness with associated systemic symptoms and significant discomfort requiring urgent management. In my opinion, this is a condition that a prudent lay person (someone who possesses an average knowledge of health and medicine) may potentially expect to result in complications if not addressed urgently such as respiratory distress, impairment of bodily function or dysfunction of bodily organs.  ?  ?Routine symptom specific, illness specific and/or disease specific instructions were discussed with the patient and/or caregiver at length.  ?  ?As such, the patient has been evaluated and  assessed, work-up was performed and treatment was provided in alignment with urgent care protocols and evidence based medicine.  Patient/parent/caregiver has been advised that the patient may require follow up for further testing and treatment if the symptoms continue in spite of treatment, as clinically indicated and appropriate. ? ?If the patient was tested for COVID-19, Influenza and/or RSV, then the patient/parent/guardian was advised to isolate at home pending the results of his/her diagnostic coronavirus test and potentially longer if they?re positive. I have also advised pt that if his/her COVID-19 test returns positive, it's recommended to self-isolate for at least 10 days after symptoms first appeared AND until fever-free for 24 hours without fever reducer AND other symptoms have improved or resolved. Discussed self-isolation recommendations as well as instructions for household member/close contacts as per the CDC and [place] DHHS, and also gave patient the [place] packet with this information." ? ?Pt states has been using albuterol 4 times daily initially and last 10 days just twice a day. He states was getting better but 2 days ago after 1 mile hike he states got moderate cough.  ? ? ?Pt states he was at [place] recently and he states toilet issue at his [place]. He had fix toilet and stepped into toilet water on floor. He thinks had blister on his foot. Then next day danced at wedding. Sunday he got tired and chills. He has redness and swelling to top of left foot. Also swelling on bottom of foot. Pt is diabetic with some digit amputations. Sugar  was 95 this morning. ? ?Pt is diabetes. Recent sugars when he checks has been less than 125.  ? ? ?Review of Systems  ?Constitutional:  Positive for chills. Negative for fatigue and fever.  ?HENT:  Negative for congestion and ear pain.   ?Respiratory:  Positive for cough and wheezing. Negative for chest tightness.   ?Cardiovascular:  Negative for chest pain and  palpitations.  ?Gastrointestinal:  Negative for abdominal pain.  ?Genitourinary:  Negative for dysuria and frequency.  ?Musculoskeletal:   ?     Left foot pain.  ?Skin:  Negative for rash.  ?Neurological:  Negative for dizziness, seizures and headaches.  ?Hematological:  Negative for adenopathy. Does not bruise/bleed easily.  ? ? ?Past Medical History:  ?Diagnosis Date  ? Acute myocardial infarction ([place]) 06/22/2018  ? Allergy   ? Basal cell carcinoma   ? Charcot's joint of left foot, non-diabetic   ? Coronary artery disease involving native coronary artery with unstable angina pectoris ([place]) 06/22/2018  ? Diabetes ([place])   ? type 2  ? GERD (gastroesophageal reflux disease)   ? History of kidney stones   ? passed  ? Hyperlipidemia   ? Hypertension   ? Neuropathy of left foot   ? Pneumonia   ? As a child  ? Right foot ulcer ([place])   ? 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 LIMA to LAD, SVG to Diag, SVG to OM2, SVG to [name], EVH via right thigh and leg  ? Type II diabetes mellitus with complication, uncontrolled   ? Uncontrolled type 2 diabetes mellitus   ? ?  ?Social History  ? ?Socioeconomic History  ? Marital status: Married  ?  Spouse name: [name]  ? Number of children: 2  ? Years of education: Not on file  ? Highest education level: Professional school degree (e.g., MD, DDS, DVM, JD)  ?Occupational History  ? Occupation: attorney  ?Tobacco Use  ? Smoking status: Never  ? Smokeless tobacco: Never  ?Vaping Use  ? Vaping Use: Never used  ?Substance and Sexual Activity  ? Alcohol use: Yes  ?  Alcohol/week: 2.0 standard drinks  ?  Types: 2 Cans of beer per week  ?  Comment: 2 cans of beer or 2 borbon  ? Drug use: Never  ? Sexual activity: Not on file  ?Other Topics Concern  ? Not on file  ?Social History Narrative  ? Not on file  ? ?Social Determinants of Health  ? ?Financial Resource Strain: Not on file  ?Food Insecurity: Not on file  ?Transportation Needs: Not on file  ?Physical Activity: Not on file  ?Stress: Not on file  ?Social  Connections: Not on file  ?Intimate Partner Violence: Not on file  ? ? ?Past Surgical History:  ?Procedure Laterality Date  ? AMPUTATION Right 01/06/2020  ? Procedure: RIGHT FOOT 2ND RAY AMPUTATION;  Surgeon: Newt Minion, MD;  Location: [facility];  Service: Orthopedics;  Laterality: Right;  ? AMPUTATION Left 01/11/2021  ? Procedure: LEFT FOOT 3RD RAY AMPUTATION;  Surgeon: Newt Minion, MD;  Location: [facility];  Service: Orthopedics;  Laterality: Left;  ? COLONOSCOPY W/ POLYPECTOMY    ? CORONARY ARTERY BYPASS GRAFT N/A 06/25/2018  ? Procedure: CORONARY ARTERY BYPASS GRAFTING (CABG) TIMES FOUR USING LEFT INTERNAL MAMMARY ARTERY AND RIGHT ENDOSCOPICALLY HARVESTED SAPHENOUS VEIN;  Surgeon: Rexene Alberts, MD;  Location: [facility];  Service: Open Heart Surgery;  Laterality: N/A;  ? ENDOVEIN HARVEST OF GREATER SAPHENOUS VEIN Right 06/25/2018  ?  Procedure: ENDOVEIN HARVEST OF GREATER SAPHENOUS VEIN;  Surgeon: Rexene Alberts, MD;  Location: [facility];  Service: Open Heart Surgery;  Laterality: Right;  ? LEFT HEART CATH AND CORONARY ANGIOGRAPHY N/A 06/22/2018  ? Procedure: LEFT HEART CATH AND CORONARY ANGIOGRAPHY;  Surgeon: Nigel Mormon, MD;  Location: [facility];  Service: Cardiovascular;  Laterality: N/A;  ? surgical debridement  Right   ? Right foot ulcer  ? TEE WITHOUT CARDIOVERSION N/A 06/25/2018  ? Procedure: TRANSESOPHAGEAL ECHOCARDIOGRAM (TEE);  Surgeon: Rexene Alberts, MD;  Location: [facility];  Service: Open Heart Surgery;  Laterality: N/A;  ? ? ?Family History  ?Problem Relation Age of Onset  ? Diabetes Mother   ? Stroke Father   ? Hypertension Father   ? Hearing loss Father   ? Colon polyps Father   ? Colon cancer Neg Hx   ? Esophageal cancer Neg Hx   ? Prostate cancer Neg Hx   ? ? ?Allergies  ?Allergen Reactions  ? Apricot Kernel Oil [Prunus] Swelling  ?  THROAT  ? Cherry Swelling  ?  THROAT  ? Jardiance [Empagliflozin] Diarrhea and Nausea And Vomiting  ? Other Other (See Comments)  ?  Fruits with a pit.  Throat swells up. Can have them if cooked   ? Peach [Prunus Persica] Swelling  ?  THROAT  ? Plum Pulp Swelling  ?  THROAT  ? ? ?Current Outpatient Medications on File Prior to Visit  ?Medication Sig Disp

## 2021-11-21 NOTE — Addendum Note (Signed)
Addended by: Anabel Halon on: 11/21/2021 05:07 PM ? ? Modules accepted: Orders ? ?

## 2021-11-22 ENCOUNTER — Ambulatory Visit: Payer: Self-pay | Admitting: Medical

## 2021-11-22 ENCOUNTER — Encounter: Payer: Self-pay | Admitting: Medical

## 2021-11-22 LAB — LACTIC ACID, PLASMA: LACTIC ACID: 2.1 mmol/L — ABNORMAL HIGH (ref 0.4–1.8)

## 2021-11-24 ENCOUNTER — Ambulatory Visit (INDEPENDENT_AMBULATORY_CARE_PROVIDER_SITE_OTHER): Payer: 59 | Admitting: Physician Assistant

## 2021-11-24 DIAGNOSIS — M79672 Pain in left foot: Secondary | ICD-10-CM | POA: Diagnosis not present

## 2021-11-24 NOTE — Progress Notes (Addendum)
Office Visit Note   [name]           Date of Birth: February 07, 1961           MRN: [id number] Visit Date: 11/24/2021              Requested by: Esperanza Richters, PA-C [id number] [place],  [place] [address] PCP: Esperanza Richters, PA-C  Chief Complaint  Patient presents with   Other    Check foot      HPI: Joshua Branch is a pleasant 61 year old gentleman who is a patient of Dr. Audrie Lia.  He presents today for urgent referral for left foot abscess.  He said he was seen and evaluated by primary care on Monday for fever, malaise and redness and abscess on the plantar surface of his left foot beneath the 5th MTP.  He did have a slightly elevated lactic acid and white count.  He was placed on a course of Bactrim.   He said in the last 24-48 hours hours he feels much better.  He thinks this  woundmay have begun 3 weeks ago when he may have stepped on a coat hanger in his closet.  Following that he developed a callus and he thinks this got worse when he was recently dancing at his son's wedding.  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  He has a history of diabetes and neuropathy  Assessment & Plan: Visit Diagnoses:  1. Pain in left foot     Plan: ABSCESS left foot.  He has significantly improved with Bactrim.  I did debride some of the callus from the plantar surface and using a sterile scalpel did open a small area that looked like a puncture wound.  Had some purulent fluid expressed.  Did take a wound culture of this and sent off.  I would like for him to get into a pair of vive socks and go into his offloading shoe which he has at home.  Should change the sock daily.  X-rays were reassuring for no osteomyelitis in this area.  He knows to cleanse this area daily with Dial soap.  He is going to follow-up with Dr. Lajoyce Corners next week.  However if he has any increasing fever chills redness he understands he must go to the emergency room given his good response to  the Bactrim I am not inclined to add another antibiotic at this time. He may need I and D with Dr. Lajoyce Corners next week  Follow-Up Instructions: No follow-ups on file.   Ortho Exam  Patient is alert, oriented, no adenopathy, well-dressed, normal affect, normal respiratory effort. Patient appears afebrile.  He does have a palpable dorsalis pedis pulse and his foot is warm.  He has some erythema and skin breakdown on the lateral side of the foot over the fifth phalanx.  He has a half dollar size delaminated area of callus with underlying area of fluctulence which I debrided.  There seems to be a central puncture wound after sterilely prepping the area I did use a 10 blade and had expression of some purulent material.  I could not probe to bone today but I did take a culture.  No ascending cellulitis  Imaging: No results found.   Labs: Lab Results  Component Value Date   HGBA1C 7.3 (A) 06/14/2021   HGBA1C 7.4 (A) 01/27/2021   HGBA1C 6.9 (H) 04/15/2020   REPTSTATUS 01/16/2021 FINAL 01/11/2021   CULT  01/11/2021    RARE METHICILLIN RESISTANT STAPHYLOCOCCUS AUREUS NO ANAEROBES ISOLATED Performed at [facility], [address], [place], [place] [address]    LABORGA METHICILLIN RESISTANT STAPHYLOCOCCUS AUREUS 01/11/2021     Lab Results  Component Value Date   ALBUMIN 4.3 05/01/2021   ALBUMIN 3.9 05/08/2019   ALBUMIN 2.7 (L) 06/23/2018    Lab Results  Component Value Date   MG 2.1 05/08/2019   MG 2.2 06/26/2018   MG 3.0 (H) 06/26/2018   No results found for: VD25OH  No results found for: PREALBUMIN    Latest Ref Rng & Units 11/21/2021   12:14 PM 05/01/2021    8:58 AM 01/11/2021   10:25 AM  CBC EXTENDED  WBC 4.0 - 10.5 K/uL 11.1   8.6   13.3    RBC 4.22 - 5.81 Mil/uL 4.49   5.48   4.62    Hemoglobin 13.0 - 17.0 g/dL 84.6   96.2   95.2    HCT 39.0 - 52.0 % 42.1   50.5   42.0    Platelets 150.0 - 400.0 K/uL 251.0   251.0   330    NEUT# 1.4 - 7.7 K/uL 9.0   5.6     Lymph#  0.7 - 4.0 K/uL 1.1   2.1        There is no height or weight on file to calculate BMI.  Orders:  Orders Placed This Encounter  Procedures   Anaerobic and Aerobic Culture   No orders of the defined types were placed in this encounter.    Procedures: No procedures performed  Clinical Data: No additional findings.  ROS:  All other systems negative, except as noted in the HPI. Review of Systems  Objective: Vital Signs: There were no vitals taken for this visit.  Specialty Comments:  No specialty comments available.  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   Acute coronary syndrome ([name] 06/22/2018   Nonhealing skin ulcer ([name] 06/22/2018   Coronary artery disease involving native coronary artery with unstable angina pectoris ([name] 06/22/2018   Acute myocardial infarction ([name] 06/22/2018   Hyperlipidemia    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   Vertigo 08/20/2016   Myringotomy tube status 08/20/2016   Ear fullness, left 08/20/2016   Asymmetrical left sensorineural hearing loss 02/06/2016   Neuropathy of left foot 02/03/2015   Charcot's joint of left foot, non-diabetic 02/03/2015   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   Cramping of hands 06/02/2014   Eustachian tube dysfunction 06/02/2014   HTN (hypertension) 05/07/2014   Obstructive sleep apnea 05/07/2014   Past Medical History:  Diagnosis Date   Acute myocardial infarction ([name] 06/22/2018   Allergy    Basal cell carcinoma    Charcot's joint of left foot, non-diabetic    Coronary artery disease involving  native coronary artery with unstable angina pectoris ([name] 06/22/2018   Diabetes ([name]    type 2   GERD (gastroesophageal reflux disease)    History of kidney stones    passed  Hyperlipidemia    Hypertension    Neuropathy of left foot    Pneumonia    As a child   Right foot ulcer ([name]    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   LIMA to LAD, SVG to Diag, SVG to OM2, SVG to [name], EVH via right thigh and leg   Type II diabetes mellitus with complication, uncontrolled    Uncontrolled type 2 diabetes mellitus     Family History  Problem Relation Age of Onset   Diabetes Mother    Stroke Father    Hypertension Father    Hearing loss Father    Colon polyps Father    Colon cancer Neg Hx    Esophageal cancer Neg Hx    Prostate cancer Neg Hx     Past Surgical History:  Procedure Laterality Date   AMPUTATION Right 01/06/2020   Procedure: RIGHT FOOT 2ND RAY AMPUTATION;  Surgeon: Nadara Mustard, MD;  Location: [facility] OR;  Service: Orthopedics;  Laterality: Right;   AMPUTATION Left 01/11/2021   Procedure: LEFT FOOT 3RD RAY AMPUTATION;  Surgeon: Nadara Mustard, MD;  Location: [facility] OR;  Service: Orthopedics;  Laterality: Left;   COLONOSCOPY W/ POLYPECTOMY     CORONARY ARTERY BYPASS GRAFT N/A 06/25/2018   Procedure: CORONARY ARTERY BYPASS GRAFTING (CABG) TIMES FOUR USING LEFT INTERNAL MAMMARY ARTERY AND RIGHT ENDOSCOPICALLY HARVESTED SAPHENOUS VEIN;  Surgeon: Purcell Nails, MD;  Location[facility] OR;  Service: Open Heart Surgery;  Laterality: N/A;   ENDOVEIN HARVEST OF GREATER SAPHENOUS VEIN Right 06/25/2018   Procedure: ENDOVEIN HARVEST OF GREATER SAPHENOUS VEIN;  Surgeon: Purcell Nails, MD;  Location: [facility] OR;  Service: Open Heart Surgery;  Laterality: Right;   LEFT HEART CATH AND CORONARY ANGIOGRAPHY N/A 06/22/2018   Procedure: LEFT HEART CATH AND CORONARY ANGIOGRAPHY;  Surgeon: Elder Negus, MD;  Location: [facility] INVASIVE CV LAB;  Service: Cardiovascular;  Laterality: N/A;   surgical debridement  Right     Right foot ulcer   TEE WITHOUT CARDIOVERSION N/A 06/25/2018   Procedure: TRANSESOPHAGEAL ECHOCARDIOGRAM (TEE);  Surgeon: Purcell Nails, MD;  Location: [facility] OR;  Service: Open Heart Surgery;  Laterality: N/A;   Social History   Occupational History   Occupation: attorney  Tobacco Use   Smoking status: Never   Smokeless tobacco: Never  Vaping Use   Vaping Use: Never used  Substance and Sexual Activity   Alcohol use: Yes    Alcohol/week: 2.0 standard drinks    Types: 2 Cans of beer per week    Comment: 2 cans of beer or 2 borbon   Drug use: Never   Sexual activity: Not on file

## 2021-11-27 ENCOUNTER — Other Ambulatory Visit (HOSPITAL_BASED_OUTPATIENT_CLINIC_OR_DEPARTMENT_OTHER): Payer: Self-pay

## 2021-11-27 ENCOUNTER — Ambulatory Visit: Payer: 59 | Admitting: Orthopedic Surgery

## 2021-11-27 MED ORDER — TESTOSTERONE 30 MG/ACT TD SOLN
TRANSDERMAL | 1 refills | Status: DC
  Filled 2021-11-27: qty 90, 30d supply, fill #0
  Filled 2022-01-16: qty 90, 30d supply, fill #1

## 2021-11-28 ENCOUNTER — Other Ambulatory Visit (HOSPITAL_BASED_OUTPATIENT_CLINIC_OR_DEPARTMENT_OTHER): Payer: Self-pay

## 2021-11-28 ENCOUNTER — Telehealth: Payer: Self-pay | Admitting: Orthopedic Surgery

## 2021-11-28 MED ORDER — TAMSULOSIN HCL 0.4 MG PO CAPS
ORAL_CAPSULE | ORAL | 3 refills | Status: DC
  Filled 2021-11-28: qty 30, 30d supply, fill #0
  Filled 2021-12-27: qty 30, 30d supply, fill #1

## 2021-11-28 NOTE — Telephone Encounter (Signed)
Can we squeeze him in somewhere

## 2021-11-28 NOTE — Telephone Encounter (Signed)
Called pt to sch follow up appt for foot ?

## 2021-11-29 ENCOUNTER — Other Ambulatory Visit (HOSPITAL_BASED_OUTPATIENT_CLINIC_OR_DEPARTMENT_OTHER): Payer: Self-pay

## 2021-11-30 ENCOUNTER — Other Ambulatory Visit (HOSPITAL_BASED_OUTPATIENT_CLINIC_OR_DEPARTMENT_OTHER): Payer: Self-pay

## 2021-11-30 ENCOUNTER — Ambulatory Visit (INDEPENDENT_AMBULATORY_CARE_PROVIDER_SITE_OTHER): Payer: 59 | Admitting: Orthopedic Surgery

## 2021-11-30 ENCOUNTER — Encounter: Payer: Self-pay | Admitting: Orthopedic Surgery

## 2021-11-30 ENCOUNTER — Other Ambulatory Visit: Payer: Self-pay | Admitting: Medical

## 2021-11-30 DIAGNOSIS — M86272 Subacute osteomyelitis, left ankle and foot: Secondary | ICD-10-CM | POA: Diagnosis not present

## 2021-11-30 DIAGNOSIS — N528 Other male erectile dysfunction: Secondary | ICD-10-CM

## 2021-11-30 MED ORDER — SILDENAFIL CITRATE 20 MG PO TABS
ORAL_TABLET | ORAL | 1 refills | Status: DC
  Filled 2021-11-30: qty 50, 10d supply, fill #0

## 2021-11-30 MED ORDER — SULFAMETHOXAZOLE-TRIMETHOPRIM 800-160 MG PO TABS
1.0000 | ORAL_TABLET | Freq: Two times a day (BID) | ORAL | 0 refills | Status: DC
  Filled 2021-11-30: qty 20, 10d supply, fill #0

## 2021-11-30 NOTE — Progress Notes (Signed)
? ?Office Visit Note ?  ?[name]           ?Date of Birth: 1961/05/23           ?MRN: [id number] ?Visit Date: 11/30/2021 ?             ?Requested by: Mackie Pai, PA-C ?[address] ?[place],  [place] [id number] ?PCP: Mackie Pai, PA-C ? ?Chief Complaint  ?Patient presents with  ? Left Foot - Follow-up  ? ? ? ? ?HPI: ?Patient is a 61 year old gentleman who presents in follow-up with a ulcer beneath the fourth metatarsal head left foot.  Patient is currently on Bactrim DS.  He states he has some kidney pain in the flank on the left side.  No history of kidney stones.  Patient is currently in a Darco shoe and he states he recently developed a ulcer on the lateral side of his foot from dancing at a wedding. ? ?Assessment & Plan: ?Visit Diagnoses:  ?1. Subacute osteomyelitis, left ankle and foot ([place])   ? ? ?Plan: We will refill his prescription for Bactrim DS recommend continue probiotics.  Recommend following up with his primary care physician for evaluation for the left kidney pain.  Will order an MRI scan of the left foot to rule out osteomyelitis of the fourth metatarsal head.  Follow-up in 2 weeks. ? ?Follow-Up Instructions: Return in about 2 weeks (around 12/14/2021).  ? ?Ortho Exam ? ?Patient is alert, oriented, no adenopathy, well-dressed, normal affect, normal respiratory effort. ?Examination patient is using a nitroglycerin patch he has a strong posterior tibial pulse.  He has a large ulcer beneath the fourth metatarsal head.  After informed consent a 10 blade knife was used to pare the callus after debridement the ulcer was 4 cm in diameter point 1 mm deep there is healthy granulation tissue the wound is flat.  There is a new abrasion over the lateral aspect of the fourth toe.  There is no cellulitis no odor no drainage. ? ?Patient's cultures just came back positive for MRSA.  This is sensitive to Bactrim resistant to doxycycline. ? ?Imaging: ?No results found. ?No images  are attached to the encounter. ? ?Labs: ?Lab Results  ?Component Value Date  ? HGBA1C 7.3 (A) 06/14/2021  ? HGBA1C 7.4 (A) 01/27/2021  ? HGBA1C 6.9 (H) 04/15/2020  ? REPTSTATUS 01/16/2021 FINAL 01/11/2021  ? CULT  01/11/2021  ?  RARE METHICILLIN RESISTANT STAPHYLOCOCCUS AUREUS ?NO ANAEROBES ISOLATED ?Performed at [facility], [place] [address], [place], [place] [address] ?  ? LABORGA METHICILLIN RESISTANT STAPHYLOCOCCUS AUREUS 01/11/2021  ? ? ? ?Lab Results  ?Component Value Date  ? ALBUMIN 4.3 05/01/2021  ? ALBUMIN 3.9 05/08/2019  ? ALBUMIN 2.7 (L) 06/23/2018  ? ? ?Lab Results  ?Component Value Date  ? MG 2.1 05/08/2019  ? MG 2.2 06/26/2018  ? MG 3.0 (H) 06/26/2018  ? ?No results found for: VD25OH ? ?No results found for: PREALBUMIN ? ?  Latest Ref Rng & Units 11/21/2021  ? 12:14 PM 05/01/2021  ?  8:58 AM 01/11/2021  ? 10:25 AM  ?CBC EXTENDED  ?WBC 4.0 - 10.5 K/uL 11.1   8.6   13.3    ?RBC 4.22 - 5.81 Mil/uL 4.49   5.48   4.62    ?Hemoglobin 13.0 - 17.0 g/dL 14.0   16.9   14.2    ?HCT 39.0 - 52.0 % 42.1   50.5   42.0    ?  Platelets 150.0 - 400.0 K/uL 251.0   251.0   330    ?NEUT# 1.4 - 7.7 K/uL 9.0   5.6     ?Lymph# 0.7 - 4.0 K/uL 1.1   2.1     ? ? ? ?There is no height or weight on file to calculate BMI. ? ?Orders:  ?Orders Placed This Encounter  ?Procedures  ? MR Foot Left w/o contrast  ? ?Meds ordered this encounter  ?Medications  ? sulfamethoxazole-trimethoprim (BACTRIM DS) 800-160 MG tablet  ?  Sig: Take 1 tablet by mouth 2 (two) times daily.  ?  Dispense:  20 tablet  ?  Refill:  0  ? ? ? Procedures: ?No procedures performed ? ?Clinical Data: ?No additional findings. ? ?ROS: ? ?All other systems negative, except as noted in the HPI. ?Review of Systems ? ?Objective: ?Vital Signs: There were no vitals taken for this visit. ? ?Specialty Comments:  ?No specialty comments available. ? ?PMFS History: ?Patient Active Problem List  ? Diagnosis Date Noted  ? 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 Osteomyelitis of third toe of left foot ([place])   ? Cutaneous abscess of left foot   ? Osteomyelitis of second toe of right foot ([place])   ? Poorly controlled type 2 diabetes mellitus with circulatory disorder ([place]) 07/15/2018  ? Numbness in both hands 07/15/2018  ? 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 Acute coronary syndrome ([place]) 06/22/2018  ? Nonhealing skin ulcer ([place]) 06/22/2018  ? 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 Acute myocardial infarction ([place]) 06/22/2018  ? Hyperlipidemia   ? 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 Vertigo 08/20/2016  ? Myringotomy tube status 08/20/2016  ? Ear fullness, left 08/20/2016  ? Asymmetrical left sensorineural hearing loss 02/06/2016  ? Neuropathy of left foot 02/03/2015  ? Charcot's joint of left foot, non-diabetic 02/03/2015  ? 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 Cramping of hands 06/02/2014  ? Eustachian tube dysfunction 06/02/2014  ? HTN (hypertension) 05/07/2014  ? Obstructive sleep apnea 05/07/2014  ? ?Past Medical History:  ?Diagnosis Date  ? Acute myocardial infarction ([place]) 06/22/2018  ? Allergy   ? Basal cell carcinoma   ? Charcot's joint of left foot, non-diabetic   ? 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 Diabetes ([place])   ? type 2  ? GERD (gastroesophageal reflux disease)   ? History of kidney stones   ? passed  ? Hyperlipidemia   ? Hypertension   ? Neuropathy of left foot   ? Pneumonia   ? As a child  ? Right foot ulcer ([place])   ? 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 LIMA to LAD, SVG to Diag, SVG to OM2, SVG to [name], EVH via right thigh and leg  ? Type II diabetes mellitus with complication, uncontrolled   ? Uncontrolled type 2 diabetes mellitus   ?  ?Family History  ?Problem Relation Age of Onset  ? Diabetes Mother   ? Stroke Father   ? Hypertension Father   ? Hearing loss Father   ? Colon polyps Father   ? Colon  cancer Neg Hx   ? Esophageal cancer Neg Hx   ? Prostate cancer Neg Hx   ?  ?Past Surgical History:  ?Procedure Laterality Date  ? AMPUTATION Right 01/06/2020  ? Procedure: RIGHT FOOT 2ND RAY  AMPUTATION;  Surgeon: Newt Minion, MD;  Location: [facility];  Service: Orthopedics;  Laterality: Right;  ? AMPUTATION Left 01/11/2021  ? Procedure: LEFT FOOT 3RD RAY AMPUTATION;  Surgeon: Newt Minion, MD;  Location: [facility];  Service: Orthopedics;  Laterality: Left;  ? COLONOSCOPY W/ POLYPECTOMY    ? CORONARY ARTERY BYPASS GRAFT N/A 06/25/2018  ? Procedure: CORONARY ARTERY BYPASS GRAFTING (CABG) TIMES FOUR USING LEFT INTERNAL MAMMARY ARTERY AND RIGHT ENDOSCOPICALLY HARVESTED SAPHENOUS VEIN;  Surgeon: Rexene Alberts, MD;  Location: [facility];  Service: Open Heart Surgery;  Laterality: N/A;  ? ENDOVEIN HARVEST OF GREATER SAPHENOUS VEIN Right 06/25/2018  ? Procedure: ENDOVEIN HARVEST OF GREATER SAPHENOUS VEIN;  Surgeon: Rexene Alberts, MD;  Location: [facility];  Service: Open Heart Surgery;  Laterality: Right;  ? LEFT HEART CATH AND CORONARY ANGIOGRAPHY N/A 06/22/2018  ? Procedure: LEFT HEART CATH AND CORONARY ANGIOGRAPHY;  Surgeon: Nigel Mormon, MD;  Location: [facility] LAB;  Service: Cardiovascular;  Laterality: N/A;  ? surgical debridement  Right   ? Right foot ulcer  ? TEE WITHOUT CARDIOVERSION N/A 06/25/2018  ? Procedure: TRANSESOPHAGEAL ECHOCARDIOGRAM (TEE);  Surgeon: Rexene Alberts, MD;  Location: [facility];  Service: Open Heart Surgery;  Laterality: N/A;  ? ?Social History  ? ?Occupational History  ? Occupation: attorney  ?Tobacco Use  ? Smoking status: Never  ? Smokeless tobacco: Never  ?Vaping Use  ? Vaping Use: Never used  ?Substance and Sexual Activity  ? Alcohol use: Yes  ?  Alcohol/week: 2.0 standard drinks  ?  Types: 2 Cans of beer per week  ?  Comment: 2 cans of beer or 2 borbon  ? Drug use: Never  ? Sexual activity: Not on file  ? ? ? ? ? ?

## 2021-12-01 ENCOUNTER — Encounter: Payer: Self-pay | Admitting: Medical

## 2021-12-01 ENCOUNTER — Other Ambulatory Visit: Payer: Self-pay | Admitting: Medical

## 2021-12-01 ENCOUNTER — Ambulatory Visit (HOSPITAL_BASED_OUTPATIENT_CLINIC_OR_DEPARTMENT_OTHER)
Admission: RE | Admit: 2021-12-01 | Discharge: 2021-12-01 | Disposition: A | Discharge status: Home / Self Care | Payer: 59 | Source: Ambulatory Visit | Attending: Medical | Admitting: Medical

## 2021-12-01 ENCOUNTER — Other Ambulatory Visit (HOSPITAL_BASED_OUTPATIENT_CLINIC_OR_DEPARTMENT_OTHER): Payer: Self-pay

## 2021-12-01 ENCOUNTER — Ambulatory Visit (INDEPENDENT_AMBULATORY_CARE_PROVIDER_SITE_OTHER): Payer: 59 | Admitting: Medical

## 2021-12-01 VITALS — BP 120/78 | HR 95 | Temp 98.0°F | Resp 16 | Wt 242.0 lb

## 2021-12-01 DIAGNOSIS — R1032 Left lower quadrant pain: Secondary | ICD-10-CM | POA: Diagnosis present

## 2021-12-01 DIAGNOSIS — R110 Nausea: Secondary | ICD-10-CM

## 2021-12-01 DIAGNOSIS — M545 Low back pain, unspecified: Secondary | ICD-10-CM | POA: Diagnosis not present

## 2021-12-01 DIAGNOSIS — N200 Calculus of kidney: Secondary | ICD-10-CM

## 2021-12-01 DIAGNOSIS — N134 Hydroureter: Secondary | ICD-10-CM

## 2021-12-01 DIAGNOSIS — M549 Dorsalgia, unspecified: Secondary | ICD-10-CM | POA: Diagnosis not present

## 2021-12-01 DIAGNOSIS — N1330 Unspecified hydronephrosis: Secondary | ICD-10-CM

## 2021-12-01 LAB — CBC WITH DIFFERENTIAL/PLATELET
Absolute Monocytes: 827 cells/uL (ref 200–950)
Basophils Absolute: 53 cells/uL (ref 0–200)
Basophils Relative: 0.5 %
Eosinophils Absolute: 74 cells/uL (ref 15–500)
Eosinophils Relative: 0.7 %
HCT: 38.5 % (ref 38.5–50.0)
Hemoglobin: 13.6 g/dL (ref 13.2–17.1)
Lymphs Abs: 1866 cells/uL (ref 850–3900)
MCH: 31.9 pg (ref 27.0–33.0)
MCHC: 35.3 g/dL (ref 32.0–36.0)
MCV: 90.2 fL (ref 80.0–100.0)
MPV: 8.9 fL (ref 7.5–12.5)
Monocytes Relative: 7.8 %
Neutro Abs: 7780 cells/uL (ref 1500–7800)
Neutrophils Relative %: 73.4 %
Platelets: 405 10*3/uL — ABNORMAL HIGH (ref 140–400)
RBC: 4.27 10*6/uL (ref 4.20–5.80)
RDW: 11.8 % (ref 11.0–15.0)
Total Lymphocyte: 17.6 %
WBC: 10.6 10*3/uL (ref 3.8–10.8)

## 2021-12-01 LAB — COMPREHENSIVE METABOLIC PANEL
AG Ratio: 1.2 (calc) (ref 1.0–2.5)
ALT: 14 U/L (ref 9–46)
AST: 13 U/L (ref 10–35)
Albumin: 3.9 g/dL (ref 3.6–5.1)
Alkaline phosphatase (APISO): 79 U/L (ref 35–144)
BUN/Creatinine Ratio: 13 (calc) (ref 6–22)
BUN: 34 mg/dL — ABNORMAL HIGH (ref 7–25)
CO2: 23 mmol/L (ref 20–32)
Calcium: 8.7 mg/dL (ref 8.6–10.3)
Chloride: 94 mmol/L — ABNORMAL LOW (ref 98–110)
Creat: 2.58 mg/dL — ABNORMAL HIGH (ref 0.70–1.30)
Globulin: 3.2 g/dL (calc) (ref 1.9–3.7)
Glucose, Bld: 90 mg/dL (ref 65–99)
Potassium: 5.3 mmol/L (ref 3.5–5.3)
Sodium: 132 mmol/L — ABNORMAL LOW (ref 135–146)
Total Bilirubin: 0.5 mg/dL (ref 0.2–1.2)
Total Protein: 7.1 g/dL (ref 6.1–8.1)

## 2021-12-01 LAB — ANAEROBIC AND AEROBIC CULTURE
MICRO NUMBER:: 13326543
MICRO NUMBER:: 13326544
SPECIMEN QUALITY:: ADEQUATE
SPECIMEN QUALITY:: ADEQUATE

## 2021-12-01 LAB — POC URINALSYSI DIPSTICK (AUTOMATED)
Bilirubin, UA: NEGATIVE
Blood, UA: NEGATIVE
Glucose, UA: NEGATIVE
Leukocytes, UA: NEGATIVE
Nitrite, UA: NEGATIVE
Protein, UA: NEGATIVE
Spec Grav, UA: 1.01 (ref 1.010–1.025)
Urobilinogen, UA: 0.2 E.U./dL
pH, UA: 5 (ref 5.0–8.0)

## 2021-12-01 LAB — LIPASE: Lipase: 37 U/L (ref 7–60)

## 2021-12-01 MED ORDER — HYDROCODONE-ACETAMINOPHEN 5-325 MG PO TABS
1.0000 | ORAL_TABLET | Freq: Four times a day (QID) | ORAL | 0 refills | Status: DC | PRN
  Filled 2021-12-01: qty 20, 5d supply, fill #0

## 2021-12-01 NOTE — Patient Instructions (Addendum)
Recent left lower back pain which initially was thought to be possible kidney stone but after interview and physical exam pain seems to be more in the left lower abdomen region.  Urinalysis did not show any blood.  On prior review of colonoscopy known diverticuli in the sigmoid colon region.  Based on this decided to place order for CT abdomen pelvis with contrast to evaluate if you have diverticulitis.  This study might show incidental kidney stone. ? ?We will get CBC, CMP and a lipase study today as well. ? ?Continue Bactrim DS for your foot infection.  Also urologist called in Flomax for possible kidney stone continue diet as well. ? ?Going to give you prescription of Norco pending lab and imaging study results. ? ?Follow-up in 1 week or sooner if needed. ? ?If lab and imaging studies negative but your pain worsens or changes then advised emergency department evaluation. ? ?After hours ct imaging did show kidney stone with hydroureter and hydronephrosis. My chart result note sent to patient informing pt after hours. ?

## 2021-12-01 NOTE — Progress Notes (Signed)
? ?Subjective:  ? ? Patient ID: Joshua Branch, male    DOB: 10-05-1960, 61 y.o.   MRN: [id number] ? ?HPI ?Pt in for some left side flank/kidney area pain since past weekend. Pt is moderate. He is hydrating. Mild occasional nausea. ? ?Pain is coming and going. Pain level at most 3. Pt states hx of kidney stone in past. He never had ct. He never saw the stone in urine but assumed he passed. He does not remember any gross blood. Pt saw urologist after the fact and he states told likley stone. Pt states he feel like based on pain no doubt about it being stone. ? ? ?Pt has been on flomax for 3 days. His urologist gave him rx. Urologist on vacation. ? ? 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 On bactrim for this. Next appt in 2 weeks.  ? ? ? ?Review of Systems  ?Constitutional:  Negative for chills, fatigue and fever.  ?Respiratory:  Negative for cough, chest tightness, shortness of breath and wheezing.   ?Cardiovascular:  Negative for chest pain and palpitations.  ?Gastrointestinal:  Positive for abdominal pain and nausea. Negative for constipation, rectal pain and vomiting.  ?     Small bowel movement today.  ?Genitourinary:  Negative for dysuria and flank pain.  ?Musculoskeletal:  Negative for back pain, myalgias and neck stiffness.  ? ? ?Past Medical History:  ?Diagnosis Date  ? Acute myocardial infarction ([place]) 06/22/2018  ? Allergy   ? Basal cell carcinoma   ? Charcot's joint of left foot, non-diabetic   ? Coronary artery disease involving native coronary artery with unstable angina pectoris ([place]) 06/22/2018  ? Diabetes ([place])   ? type 2  ? GERD (gastroesophageal reflux disease)   ? History of kidney stones   ? passed  ? Hyperlipidemia   ? Hypertension   ? Neuropathy of left foot   ? Pneumonia   ? As a child  ? Right foot ulcer ([place])   ? 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 LIMA to LAD, SVG to Diag, SVG to OM2, SVG to [name], EVH via right thigh and leg  ? Type II diabetes mellitus with complication,  uncontrolled   ? Uncontrolled type 2 diabetes mellitus   ? ?  ?Social History  ? ?Socioeconomic History  ? Marital status: Married  ?  Spouse name: [name]  ? Number of children: 2  ? Years of education: Not on file  ? Highest education level: Professional school degree (e.g., MD, DDS, DVM, JD)  ?Occupational History  ? Occupation: attorney  ?Tobacco Use  ? Smoking status: Never  ? Smokeless tobacco: Never  ?Vaping Use  ? Vaping Use: Never used  ?Substance and Sexual Activity  ? Alcohol use: Yes  ?  Alcohol/week: 2.0 standard drinks  ?  Types: 2 Cans of beer per week  ?  Comment: 2 cans of beer or 2 borbon  ? Drug use: Never  ? Sexual activity: Not on file  ?Other Topics Concern  ? Not on file  ?Social History Narrative  ? Not on file  ? ?Social Determinants of Health  ? ?Financial Resource Strain: Not on file  ?Food Insecurity: Not on file  ?Transportation Needs: Not on file  ?Physical Activity: Not on file  ?Stress: Not on file  ?Social Connections: Not on file  ?Intimate Partner Violence: Not on file  ? ? ?Past Surgical History:  ?Procedure Laterality Date  ? AMPUTATION Right 01/06/2020  ?  Procedure: RIGHT FOOT 2ND RAY AMPUTATION;  Surgeon: Newt Minion, MD;  Location: [facility];  Service: Orthopedics;  Laterality: Right;  ? AMPUTATION Left 01/11/2021  ? Procedure: LEFT FOOT 3RD RAY AMPUTATION;  Surgeon: Newt Minion, MD;  Location: [facility];  Service: Orthopedics;  Laterality: Left;  ? COLONOSCOPY W/ POLYPECTOMY    ? CORONARY ARTERY BYPASS GRAFT N/A 06/25/2018  ? Procedure: CORONARY ARTERY BYPASS GRAFTING (CABG) TIMES FOUR USING LEFT INTERNAL MAMMARY ARTERY AND RIGHT ENDOSCOPICALLY HARVESTED SAPHENOUS VEIN;  Surgeon: Rexene Alberts, MD;  Location: [facility];  Service: Open Heart Surgery;  Laterality: N/A;  ? ENDOVEIN HARVEST OF GREATER SAPHENOUS VEIN Right 06/25/2018  ? Procedure: ENDOVEIN HARVEST OF GREATER SAPHENOUS VEIN;  Surgeon: Rexene Alberts, MD;  Location: [facility];  Service: Open Heart Surgery;  Laterality:  Right;  ? LEFT HEART CATH AND CORONARY ANGIOGRAPHY N/A 06/22/2018  ? Procedure: LEFT HEART CATH AND CORONARY ANGIOGRAPHY;  Surgeon: Nigel Mormon, MD;  Location: [facility];  Service: Cardiovascular;  Laterality: N/A;  ? surgical debridement  Right   ? Right foot ulcer  ? TEE WITHOUT CARDIOVERSION N/A 06/25/2018  ? Procedure: TRANSESOPHAGEAL ECHOCARDIOGRAM (TEE);  Surgeon: Rexene Alberts, MD;  Location: [facility];  Service: Open Heart Surgery;  Laterality: N/A;  ? ? ?Family History  ?Problem Relation Age of Onset  ? Diabetes Mother   ? Stroke Father   ? Hypertension Father   ? Hearing loss Father   ? Colon polyps Father   ? Colon cancer Neg Hx   ? Esophageal cancer Neg Hx   ? Prostate cancer Neg Hx   ? ? ?Allergies  ?Allergen Reactions  ? Apricot Kernel Oil [Prunus] Swelling  ?  THROAT  ? Cherry Swelling  ?  THROAT  ? Jardiance [Empagliflozin] Diarrhea and Nausea And Vomiting  ? Other Other (See Comments)  ?  Fruits with a pit. Throat swells up. Can have them if cooked   ? Peach [Prunus Persica] Swelling  ?  THROAT  ? Plum Pulp Swelling  ?  THROAT  ? ? ?Current Outpatient Medications on File Prior to Visit  ?Medication Sig Dispense Refill  ? albuterol (VENTOLIN HFA) 108 (90 Base) MCG/ACT inhaler Inhale 2 puffs into the lungs every 6 (six) hours as needed for wheezing or shortness of breath (Cough). 18 g 0  ? aspirin EC 81 MG tablet Take 81 mg by mouth daily.    ? atorvastatin (LIPITOR) 80 MG tablet Take 1 tablet (80 mg total) by mouth daily. 90 tablet 3  ? benzonatate (TESSALON) 100 MG capsule Take 1 capsule (100 mg total) by mouth 3 (three) times daily as needed for cough. 30 capsule 0  ? Blood Glucose Monitoring Suppl (FREESTYLE LITE) DEVI     ? budesonide-formoterol (SYMBICORT) 160-4.5 MCG/ACT inhaler Inhale 2 puffs into the lungs 2 (two) times daily. 10.2 g 1  ? clopidogrel (PLAVIX) 75 MG tablet TAKE 1 TABLET (75 MG TOTAL) BY MOUTH DAILY. 90 tablet 3  ? Coenzyme Q10 (CO Q-10) 300 MG CAPS Take 300 mg  by mouth daily.    ? COVID-19 mRNA bivalent vaccine, Pfizer, injection Inject into the muscle. 0.3 mL 0  ? dapagliflozin propanediol (FARXIGA) 10 MG TABS tablet Take 1 tablet (10 mg total) by mouth daily before breakfast. 90 tablet 3  ? doxycycline (VIBRA-TABS) 100 MG tablet Take 1 tablet (100 mg total) by mouth 2 (two) times daily. 28 tablet 0  ? glipiZIDE (GLUCOTROL XL)  5 MG 24 hr tablet Take 1 tablet (5 mg total) by mouth daily before breakfast. 90 tablet 3  ? glucose blood (FREESTYLE LITE) test strip USE AS INSTRUCTED TO CHECK 1-2X A DAY 100 strip 1  ? hydrochlorothiazide (MICROZIDE) 12.5 MG capsule TAKE 1 CAPSULE (12.5 MG TOTAL) BY MOUTH DAILY. 90 capsule 1  ? influenza vac split quadrivalent PF (FLUARIX) 0.5 ML injection Inject into the muscle. 0.5 mL 0  ? Lancets MISC Check sugars 3 times a day E11.9 100 each 0  ? levocetirizine (XYZAL) 5 MG tablet Take 1 tablet (5 mg total) by mouth every evening. 30 tablet 3  ? lisinopril (ZESTRIL) 10 MG tablet TAKE 1 TABLET (10 MG TOTAL) BY MOUTH DAILY. 90 tablet 0  ? metFORMIN (GLUCOPHAGE) 500 MG tablet Take 3 tablets by mouth daily as advised 270 tablet 3  ? metoprolol tartrate (LOPRESSOR) 25 MG tablet TAKE 1 TABLET (25 MG TOTAL) BY MOUTH 2 (TWO) TIMES DAILY. 180 tablet 3  ? Multiple Vitamin (MULTIVITAMIN WITH MINERALS) TABS tablet Take 1 tablet by mouth daily.    ? nitroGLYCERIN (NITRODUR - DOSED IN MG/24 HR) 0.2 mg/hr patch Place 1 patch (0.2 mg total) onto the skin daily. 30 patch 12  ? pentoxifylline (TRENTAL) 400 MG CR tablet Take 1 tablet (400 mg total) by mouth 3 (three) times daily with meals. 90 tablet 3  ? sildenafil (REVATIO) 20 MG tablet TAKE 2-5 TABLETS BY MOUTH ONE HOUR BEFORE SEX AS NEEDED **MAX OF 5 TABLETS WITHIN A 24 HOUR PERIOD** 50 tablet 1  ? sulfamethoxazole-trimethoprim (BACTRIM DS) 800-160 MG tablet Take 1 tablet by mouth 2 (two) times daily. 20 tablet 0  ? tadalafil (CIALIS) 10 MG tablet Take 1 tablet (10 mg total) by mouth every other day as  needed for erectile dysfunction. 10 tablet 1  ? tamsulosin (FLOMAX) 0.4 MG CAPS capsule Take 1 capsule by mouth once daily 30 capsule 3  ? Testosterone 30 MG/ACT SOLN Apply 2 pumps on to the skin once daily 90 mL 1

## 2021-12-02 ENCOUNTER — Encounter: Payer: Self-pay | Admitting: Medical

## 2021-12-02 NOTE — Addendum Note (Signed)
Addended by: Anabel Halon on: 12/02/2021 09:08 PM ? ? Modules accepted: Orders ? ?

## 2021-12-03 ENCOUNTER — Telehealth: Payer: Self-pay | Admitting: Medical

## 2021-12-03 NOTE — Telephone Encounter (Signed)
I did call pt tonight. Pt still mild uncomfortable but not in severe pain. No fever, no chills and now sweats. He is urinating. No blood reported. [facility] will call [facility] tomorrow in morning then update me. We will also be trying to call office as well for appointment. Hopefully urologist will be back from vacation or other in office can see.  ?

## 2021-12-03 NOTE — Telephone Encounter (Signed)
Would you please see new referral for kidney stone. Trying to get pt in on 12/04/21 if possible. Will you update me if possible by late Monday morning. ? ?Thanks, ?

## 2021-12-04 ENCOUNTER — Ambulatory Visit (HOSPITAL_COMMUNITY): Payer: 59 | Admitting: Anesthesiology

## 2021-12-04 ENCOUNTER — Ambulatory Visit (HOSPITAL_COMMUNITY)
Admission: RE | Admit: 2021-12-04 | Discharge: 2021-12-04 | Disposition: A | Discharge status: Home / Self Care | Payer: 59 | Source: Ambulatory Visit | Attending: Urology | Admitting: Urology

## 2021-12-04 ENCOUNTER — Encounter (HOSPITAL_COMMUNITY): Payer: Self-pay | Admitting: Urology

## 2021-12-04 ENCOUNTER — Other Ambulatory Visit (HOSPITAL_BASED_OUTPATIENT_CLINIC_OR_DEPARTMENT_OTHER): Payer: Self-pay

## 2021-12-04 ENCOUNTER — Ambulatory Visit (HOSPITAL_BASED_OUTPATIENT_CLINIC_OR_DEPARTMENT_OTHER): Payer: 59 | Admitting: Anesthesiology

## 2021-12-04 ENCOUNTER — Other Ambulatory Visit: Payer: Self-pay | Admitting: Urology

## 2021-12-04 ENCOUNTER — Encounter (HOSPITAL_COMMUNITY)
Admission: RE | Disposition: A | Discharge status: Home / Self Care | Payer: Self-pay | Source: Ambulatory Visit | Attending: Urology

## 2021-12-04 ENCOUNTER — Ambulatory Visit (HOSPITAL_COMMUNITY): Payer: 59

## 2021-12-04 ENCOUNTER — Telehealth: Payer: Self-pay

## 2021-12-04 ENCOUNTER — Telehealth: Payer: Self-pay | Admitting: Cardiovascular Disease

## 2021-12-04 DIAGNOSIS — I252 Old myocardial infarction: Secondary | ICD-10-CM | POA: Diagnosis not present

## 2021-12-04 DIAGNOSIS — Z951 Presence of aortocoronary bypass graft: Secondary | ICD-10-CM | POA: Insufficient documentation

## 2021-12-04 DIAGNOSIS — N132 Hydronephrosis with renal and ureteral calculous obstruction: Secondary | ICD-10-CM | POA: Insufficient documentation

## 2021-12-04 DIAGNOSIS — E119 Type 2 diabetes mellitus without complications: Secondary | ICD-10-CM | POA: Insufficient documentation

## 2021-12-04 DIAGNOSIS — I10 Essential (primary) hypertension: Secondary | ICD-10-CM

## 2021-12-04 DIAGNOSIS — R7989 Other specified abnormal findings of blood chemistry: Secondary | ICD-10-CM | POA: Diagnosis not present

## 2021-12-04 DIAGNOSIS — Z7984 Long term (current) use of oral hypoglycemic drugs: Secondary | ICD-10-CM | POA: Insufficient documentation

## 2021-12-04 DIAGNOSIS — I2510 Atherosclerotic heart disease of native coronary artery without angina pectoris: Secondary | ICD-10-CM | POA: Insufficient documentation

## 2021-12-04 DIAGNOSIS — N179 Acute kidney failure, unspecified: Secondary | ICD-10-CM | POA: Diagnosis not present

## 2021-12-04 HISTORY — PX: CYSTOSCOPY WITH STENT PLACEMENT: SHX5790

## 2021-12-04 LAB — GLUCOSE, CAPILLARY
Glucose-Capillary: 234 mg/dL — ABNORMAL HIGH (ref 70–99)
Glucose-Capillary: 156 mg/dL — ABNORMAL HIGH (ref 70–99)

## 2021-12-04 SURGERY — CYSTOSCOPY, WITH STENT INSERTION
Anesthesia: General | Laterality: Left

## 2021-12-04 MED ORDER — HYDROCODONE-ACETAMINOPHEN 5-325 MG PO TABS
2.0000 | ORAL_TABLET | Freq: Four times a day (QID) | ORAL | 0 refills | Status: DC | PRN
  Filled 2021-12-04: qty 12, 2d supply, fill #0

## 2021-12-04 MED ORDER — FENTANYL CITRATE (PF) 100 MCG/2ML IJ SOLN
INTRAMUSCULAR | Status: DC | PRN
  Administered 2021-12-04: 100 ug via INTRAVENOUS

## 2021-12-04 MED ORDER — PROPOFOL 10 MG/ML IV BOLUS
INTRAVENOUS | Status: AC
  Filled 2021-12-04: qty 20

## 2021-12-04 MED ORDER — ACETAMINOPHEN 10 MG/ML IV SOLN: 1000.0000 mg | Freq: Once | INTRAVENOUS | Status: DC | PRN

## 2021-12-04 MED ORDER — DEXAMETHASONE SODIUM PHOSPHATE 10 MG/ML IJ SOLN
INTRAMUSCULAR | Status: AC
  Filled 2021-12-04: qty 1

## 2021-12-04 MED ORDER — FENTANYL CITRATE (PF) 100 MCG/2ML IJ SOLN
INTRAMUSCULAR | Status: AC
  Filled 2021-12-04: qty 2

## 2021-12-04 MED ORDER — DEXAMETHASONE SODIUM PHOSPHATE 10 MG/ML IJ SOLN
INTRAMUSCULAR | Status: DC | PRN
  Administered 2021-12-04: 10 mg via INTRAVENOUS

## 2021-12-04 MED ORDER — DEXMEDETOMIDINE (PRECEDEX) IN NS 20 MCG/5ML (4 MCG/ML) IV SYRINGE
PREFILLED_SYRINGE | INTRAVENOUS | Status: AC
  Filled 2021-12-04: qty 5

## 2021-12-04 MED ORDER — OXYCODONE HCL 5 MG PO TABS: 5.0000 mg | ORAL_TABLET | Freq: Once | ORAL | Status: DC | PRN

## 2021-12-04 MED ORDER — LIDOCAINE 2% (20 MG/ML) 5 ML SYRINGE
INTRAMUSCULAR | Status: DC | PRN
Start: 2021-12-04 — End: 2021-12-04
  Administered 2021-12-04: 60 mg via INTRAVENOUS

## 2021-12-04 MED ORDER — IOHEXOL 300 MG/ML  SOLN
INTRAMUSCULAR | Status: DC | PRN
  Administered 2021-12-04: 8 mL

## 2021-12-04 MED ORDER — ONDANSETRON HCL 4 MG/2ML IJ SOLN
INTRAMUSCULAR | Status: AC
  Filled 2021-12-04: qty 2

## 2021-12-04 MED ORDER — ORAL CARE MOUTH RINSE: 15.0000 mL | Freq: Once | OROMUCOSAL | Status: AC

## 2021-12-04 MED ORDER — WATER FOR IRRIGATION, STERILE IR SOLN
Status: DC | PRN
  Administered 2021-12-04: 3000 mL

## 2021-12-04 MED ORDER — CHLORHEXIDINE GLUCONATE 0.12 % MT SOLN
15.0000 mL | Freq: Once | OROMUCOSAL | Status: AC
  Administered 2021-12-04: 15 mL via OROMUCOSAL

## 2021-12-04 MED ORDER — ONDANSETRON HCL 4 MG/2ML IJ SOLN
INTRAMUSCULAR | Status: DC | PRN
  Administered 2021-12-04: 4 mg via INTRAVENOUS

## 2021-12-04 MED ORDER — LACTATED RINGERS IV SOLN: INTRAVENOUS | Status: DC

## 2021-12-04 MED ORDER — CEFAZOLIN SODIUM-DEXTROSE 2-4 GM/100ML-% IV SOLN
2.0000 g | Freq: Once | INTRAVENOUS | Status: AC
Start: 2021-12-04 — End: 2021-12-04
  Administered 2021-12-04: 2 g via INTRAVENOUS
  Filled 2021-12-04: qty 100

## 2021-12-04 MED ORDER — KETOROLAC TROMETHAMINE 30 MG/ML IJ SOLN: 15.0000 mg | Freq: Once | INTRAMUSCULAR | Status: DC

## 2021-12-04 MED ORDER — SUCCINYLCHOLINE CHLORIDE 200 MG/10ML IV SOSY
PREFILLED_SYRINGE | INTRAVENOUS | Status: DC | PRN
  Administered 2021-12-04: 100 mg via INTRAVENOUS

## 2021-12-04 MED ORDER — PROPOFOL 10 MG/ML IV BOLUS
INTRAVENOUS | Status: DC | PRN
  Administered 2021-12-04: 200 mg via INTRAVENOUS

## 2021-12-04 MED ORDER — MIDAZOLAM HCL 2 MG/2ML IJ SOLN
INTRAMUSCULAR | Status: AC
Start: 2021-12-04 — End: ?
  Filled 2021-12-04: qty 2

## 2021-12-04 MED ORDER — MIDAZOLAM HCL 2 MG/2ML IJ SOLN
INTRAMUSCULAR | Status: DC | PRN
  Administered 2021-12-04: 2 mg via INTRAVENOUS

## 2021-12-04 MED ORDER — PHENYLEPHRINE 80 MCG/ML (10ML) SYRINGE FOR IV PUSH (FOR BLOOD PRESSURE SUPPORT)
PREFILLED_SYRINGE | INTRAVENOUS | Status: DC | PRN
  Administered 2021-12-04: 160 ug via INTRAVENOUS

## 2021-12-04 MED ORDER — FENTANYL CITRATE PF 50 MCG/ML IJ SOSY: 25.0000 ug | PREFILLED_SYRINGE | INTRAMUSCULAR | Status: DC | PRN

## 2021-12-04 MED ORDER — OXYCODONE HCL 5 MG/5ML PO SOLN: 5.0000 mg | Freq: Once | ORAL | Status: DC | PRN

## 2021-12-04 SURGICAL SUPPLY — 13 items
BAG URO CATCHER STRL LF (MISCELLANEOUS) ×2 IMPLANT
CATH URETL OPEN 5X70 (CATHETERS) ×2 IMPLANT
CLOTH BEACON ORANGE TIMEOUT ST (SAFETY) ×2 IMPLANT
GLOVE BIO SURGEON STRL SZ7 (GLOVE) ×2 IMPLANT
GLOVE SURG LX 7.5 STRW (GLOVE) ×1
GLOVE SURG LX STRL 7.5 STRW (GLOVE) ×1 IMPLANT
GOWN STRL REUS W/ TWL LRG LVL3 (GOWN DISPOSABLE) ×1 IMPLANT
GOWN STRL REUS W/TWL LRG LVL3 (GOWN DISPOSABLE) ×2
GUIDEWIRE ZIPWRE .038 STRAIGHT (WIRE) ×2 IMPLANT
MANIFOLD NEPTUNE II (INSTRUMENTS) ×2 IMPLANT
PACK CYSTO (CUSTOM PROCEDURE TRAY) ×2 IMPLANT
STENT URET 6FRX28 CONTOUR (STENTS) ×1 IMPLANT
TUBING CONNECTING 10 (TUBING) ×2 IMPLANT

## 2021-12-04 NOTE — Op Note (Signed)
Operative Note ? ?Preoperative diagnosis:  ?1.  Left ureteral stone ?2. AKI ?3. L hydroureteronephrosis ? ?Postoperative diagnosis: ?1.  same ? ?Procedure(s): ?1.  Cystoscopy ?2. left retrograde pyelogram with interpretation ?3. left ureteral stent placement (6x28cm) ?4. Fluoroscopy <1 hour with intraoperative interpretation ? ?Surgeon: Donald Pore, MD ? ?Assistants:  None ? ?Anesthesia:  General ? ?Complications:  None ? ?EBL:  minimal ? ?Specimens: ?1. none ? ?Drains/Catheters: ?1.  Left 6Fr x 28cm ureteral stent ? ?Intraoperative findings:   ?Cystoscopy demonstrated no suspicious lesions, masses, stones or other pathology. There was a high median bar and mild trabeculation ?Retrograde pyelogram demonstrated left hydronephrosis. ?Successful left ureteral stent placement with curl in the renal pelvis and bladder respectively. ? ?Indication:  Joshua Branch is a 61 y.o. male with an obstructing ureteral stone on the left and elevated creatinine. After reviewing the management options for treatment, he elected to proceed with the above surgical procedure(s). We have discussed the potential benefits and risks of the procedure, side effects of the proposed treatment, the likelihood of the patient achieving the goals of the procedure, and any potential problems that might occur during the procedure or recuperation. Informed consent has been obtained. ? ?Description of procedure: ?The patient was taken to the operating room and general anesthesia was induced.  The patient was placed in the dorsal lithotomy position, prepped and draped in the usual sterile fashion, and preoperative antibiotics were administered. A preoperative time-out was performed.  ? ?Cystourethroscopy was performed.  The patient?s urethra was examined. There was some bilobar prostatic hypertrophy and a high median bar. The bladder was then systematically examined in its entirety. There was no evidence for any bladder tumors, stones, or other mucosal  pathology.  There were mild trabeculations ? ?Attention then turned to the left ureteral orifice. A 0.038 zip wire was passed through the left orifice and over the wire a 5 Fr open ended catheter was inserted and passed up to the level of the renal pelvis. Omnipaque contrast was injected through the ureteral catheter and a retrograde pyelogram was performed with findings as dictated above. The wire was then replaced and the open ended catheter was removed.  ? ?A 6Fr x 28cm ureteral stent was advance over the wire. The stent was positioned appropriately under fluoroscopic and cystoscopic guidance.  The wire was then removed with an adequate stent curl noted in the renal pelvis as well as in the bladder. There was some brown debris that drained from the collecting system ? ?The bladder was then emptied and the procedure ended.  The patient appeared to tolerate the procedure well and without complications.  The patient was able to be awakened and transferred to the recovery unit in satisfactory condition.  ? ?Plan:  Joshua Branch will check labs in office tomorrow to assess his kidney function. Otherwise will plan for L ESWL on thursday ? ?Joshua Branch ?12/04/21 ? ?

## 2021-12-04 NOTE — Anesthesia Procedure Notes (Signed)
Procedure Name: Intubation ?Date/Time: 12/04/2021 6:00 PM ?Performed by: Gerald Leitz, CRNA ?Pre-anesthesia Checklist: Patient identified, Patient being monitored, Timeout performed, Emergency Drugs available and Suction available ?Patient Re-evaluated:Patient Re-evaluated prior to induction ?Oxygen Delivery Method: Circle system utilized ?Preoxygenation: Pre-oxygenation with 100% oxygen ?Induction Type: IV induction ?Ventilation: Mask ventilation without difficulty ?Laryngoscope Size: Mac and 3 ?Grade View: Grade I ?Tube type: Oral ?Tube size: 7.5 mm ?Number of attempts: 1 ?Airway Equipment and Method: Stylet ?Placement Confirmation: ETT inserted through vocal cords under direct vision, positive ETCO2 and breath sounds checked- equal and bilateral ?Secured at: 22 cm ?Tube secured with: Tape ?Dental Injury: Teeth and Oropharynx as per pre-operative assessment  ? ? ? ? ?

## 2021-12-04 NOTE — H&P (Signed)
H&P ? ?History of Present Illness: Joshua Branch is a 61 y.o. year old who presents today for L ureteral stent placement. The patient has an obstructing left ureteral stone in the setting of an elevated creatinine.  ? ?Past Medical History:  ?Diagnosis Date  ? Acute myocardial infarction ([place]) 06/22/2018  ? Allergy   ? Basal cell carcinoma   ? Charcot's joint of left foot, non-diabetic   ? Coronary artery disease involving native coronary artery with unstable angina pectoris ([place]) 06/22/2018  ? Diabetes ([place])   ? type 2  ? GERD (gastroesophageal reflux disease)   ? History of kidney stones   ? passed  ? Hyperlipidemia   ? Hypertension   ? Neuropathy of left foot   ? Pneumonia   ? As a child  ? Right foot ulcer ([place])   ? 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 LIMA to LAD, SVG to Diag, SVG to OM2, SVG to [name], EVH via right thigh and leg  ? Type II diabetes mellitus with complication, uncontrolled   ? Uncontrolled type 2 diabetes mellitus   ? ? ?Past Surgical History:  ?Procedure Laterality Date  ? AMPUTATION Right 01/06/2020  ? Procedure: RIGHT FOOT 2ND RAY AMPUTATION;  Surgeon: Newt Minion, MD;  Location: [facility];  Service: Orthopedics;  Laterality: Right;  ? AMPUTATION Left 01/11/2021  ? Procedure: LEFT FOOT 3RD RAY AMPUTATION;  Surgeon: Newt Minion, MD;  Location: [facility];  Service: Orthopedics;  Laterality: Left;  ? COLONOSCOPY W/ POLYPECTOMY    ? CORONARY ARTERY BYPASS GRAFT N/A 06/25/2018  ? Procedure: CORONARY ARTERY BYPASS GRAFTING (CABG) TIMES FOUR USING LEFT INTERNAL MAMMARY ARTERY AND RIGHT ENDOSCOPICALLY HARVESTED SAPHENOUS VEIN;  Surgeon: Rexene Alberts, MD;  Location: [facility];  Service: Open Heart Surgery;  Laterality: N/A;  ? ENDOVEIN HARVEST OF GREATER SAPHENOUS VEIN Right 06/25/2018  ? Procedure: ENDOVEIN HARVEST OF GREATER SAPHENOUS VEIN;  Surgeon: Rexene Alberts, MD;  Location: [facility];  Service: Open Heart Surgery;  Laterality: Right;  ? LEFT HEART CATH AND CORONARY ANGIOGRAPHY N/A 06/22/2018  ?  Procedure: LEFT HEART CATH AND CORONARY ANGIOGRAPHY;  Surgeon: Nigel Mormon, MD;  Location: [facility];  Service: Cardiovascular;  Laterality: N/A;  ? surgical debridement  Right   ? Right foot ulcer  ? TEE WITHOUT CARDIOVERSION N/A 06/25/2018  ? Procedure: TRANSESOPHAGEAL ECHOCARDIOGRAM (TEE);  Surgeon: Rexene Alberts, MD;  Location: [facility];  Service: Open Heart Surgery;  Laterality: N/A;  ? ? ?Home Medications:  ?No outpatient medications have been marked as taking for the 12/04/21 encounter [facility] Encounter).  ? ? ?Allergies:  ?Allergies  ?Allergen Reactions  ? Apricot Kernel Oil [Prunus] Swelling  ?  THROAT  ? Cherry Swelling  ?  THROAT  ? Jardiance [Empagliflozin] Diarrhea and Nausea And Vomiting  ? Other Other (See Comments)  ?  Fruits with a pit. Throat swells up. Can have them if cooked   ? Peach [Prunus Persica] Swelling  ?  THROAT  ? Plum Pulp Swelling  ?  THROAT  ? ? ?Family History  ?Problem Relation Age of Onset  ? Diabetes Mother   ? Stroke Father   ? Hypertension Father   ? Hearing loss Father   ? Colon polyps Father   ? Colon cancer Neg Hx   ? Esophageal cancer Neg Hx   ? Prostate cancer Neg Hx   ? ? ?Social History:  reports that he has never smoked. He has  never used smokeless tobacco. He reports current alcohol use of about 2.0 standard drinks per week. He reports that he does not use drugs. ? ?ROS: ?A complete review of systems was performed.  All systems are negative except for pertinent findings as noted. ? ?Physical Exam:  ?Vital signs in last 24 hours: ?  ?Constitutional:  Alert and oriented, No acute distress ?Cardiovascular: Regular rate and rhythm, No JVD ?Respiratory: Normal respiratory effort, Lungs clear bilaterally ?GI: Abdomen is soft, nontender, nondistended, no abdominal masses ?GU: No CVA tenderness, ?Lymphatic: No lymphadenopathy ?Neurologic: Grossly intact, no focal deficits ?Psychiatric: Normal mood and affect ? ? ?Laboratory Data:  ?Recent Labs  ?   12/01/21 ?1548  ?WBC 10.6  ?HGB 13.6  ?HCT 38.5  ?PLT 405*  ? ? ?Recent Labs  ?  12/01/21 ?1548  ?NA 132*  ?K 5.3  ?CL 94*  ?GLUCOSE 90  ?BUN 34*  ?CALCIUM 8.7  ?CREATININE 2.58*  ? ? ? ?No results found for this or any previous visit (from the past 24 hour(s)). ?No results found for this or any previous visit (from the past 240 hour(s)). ? ?Renal Function: ?Recent Labs  ?  12/01/21 ?1548  ?CREATININE 2.58*  ? ?Estimated Creatinine Clearance: 41 mL/min (A) (by C-G formula based on SCr of 2.58 mg/dL (H)). ? ?Radiologic Imaging: ?No results found. ? ?Assessment:  ?61 yo M with an obstructing left ureteral stone, elevated creatinine. ? ?Plan:  ?To OR for left stent placement. Planning staged stone treatment later this week ([facility]).  ? ?Donald Pore, MD ?12/04/2021, 3:23 PM  ?[facility] ?Pager: [phone] ? ? ?

## 2021-12-04 NOTE — Anesthesia Preprocedure Evaluation (Addendum)
Anesthesia Evaluation  ?Patient identified by MRN, date of birth, ID band ?Patient awake ? ? ? ?Reviewed: ?Allergy & Precautions, NPO status , Patient's Chart, lab work & pertinent test results ? ?Airway ?Mallampati: III ? ?TM Distance: >3 FB ?Neck ROM: Full ? ? ? Dental ?no notable dental hx. ? ?  ?Pulmonary ? ?  ?Pulmonary exam normal ? ? ? ? ? ? ? Cardiovascular ?hypertension, Pt. on medications and Pt. on home beta blockers ?+ CAD, + Past MI and + CABG  ?Normal cardiovascular exam ? ? ?  ?Neuro/Psych ? Neuromuscular disease negative psych ROS  ? GI/Hepatic ?negative GI ROS, Neg liver ROS,   ?Endo/Other  ?diabetes, Oral Hypoglycemic Agents ? Renal/GU ?Renal disease  ? ?  ?Musculoskeletal ? ?(+) Arthritis ,  ? Abdominal ?  ?Peds ? Hematology ?negative hematology ROS ?(+)   ?Anesthesia Other Findings ?LEFT URETERAL STONE ? Reproductive/Obstetrics ? ?  ? ? ? ? ? ? ? ? ? ? ? ? ? ?  ?  ? ? ? ? ? ? ?Anesthesia Physical ?Anesthesia Plan ? ?ASA: 3 ? ?Anesthesia Plan: General  ? ?Post-op Pain Management:   ? ?Induction: Intravenous and Rapid sequence ? ?PONV Risk Score and Plan: 2 and Ondansetron, Dexamethasone, Midazolam and Treatment may vary due to age or medical condition ? ?Airway Management Planned: Oral ETT ? ?Additional Equipment:  ? ?Intra-op Plan:  ? ?Post-operative Plan: Extubation in OR ? ?Informed Consent: I have reviewed the patients History and Physical, chart, labs and discussed the procedure including the risks, benefits and alternatives for the proposed anesthesia with the patient or authorized representative who has indicated his/her understanding and acceptance.  ? ? ? ?Dental advisory given ? ?Plan Discussed with: CRNA ? ?Anesthesia Plan Comments:   ? ? ? ? ? ? ?Anesthesia Quick Evaluation ? ?

## 2021-12-04 NOTE — H&P (View-Only) (Signed)
H&P ? ?History of Present Illness: Joshua Branch is a 61 y.o. year old who presents today for L ureteral stent placement. The patient has an obstructing left ureteral stone in the setting of an elevated creatinine.  ? ?Past Medical History:  ?Diagnosis Date  ? Acute myocardial infarction ([place]) 06/22/2018  ? Allergy   ? Basal cell carcinoma   ? Charcot's joint of left foot, non-diabetic   ? Coronary artery disease involving native coronary artery with unstable angina pectoris ([place]) 06/22/2018  ? Diabetes ([place])   ? type 2  ? GERD (gastroesophageal reflux disease)   ? History of kidney stones   ? passed  ? Hyperlipidemia   ? Hypertension   ? Neuropathy of left foot   ? Pneumonia   ? As a child  ? Right foot ulcer ([place])   ? 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 LIMA to LAD, SVG to Diag, SVG to OM2, SVG to [name], EVH via right thigh and leg  ? Type II diabetes mellitus with complication, uncontrolled   ? Uncontrolled type 2 diabetes mellitus   ? ? ?Past Surgical History:  ?Procedure Laterality Date  ? AMPUTATION Right 01/06/2020  ? Procedure: RIGHT FOOT 2ND RAY AMPUTATION;  Surgeon: Newt Minion, MD;  Location: [place];  Service: Orthopedics;  Laterality: Right;  ? AMPUTATION Left 01/11/2021  ? Procedure: LEFT FOOT 3RD RAY AMPUTATION;  Surgeon: Newt Minion, MD;  Location: [facility];  Service: Orthopedics;  Laterality: Left;  ? COLONOSCOPY W/ POLYPECTOMY    ? CORONARY ARTERY BYPASS GRAFT N/A 06/25/2018  ? Procedure: CORONARY ARTERY BYPASS GRAFTING (CABG) TIMES FOUR USING LEFT INTERNAL MAMMARY ARTERY AND RIGHT ENDOSCOPICALLY HARVESTED SAPHENOUS VEIN;  Surgeon: Rexene Alberts, MD;  Location: [place] [facility];  Service: Open Heart Surgery;  Laterality: N/A;  ? ENDOVEIN HARVEST OF GREATER SAPHENOUS VEIN Right 06/25/2018  ? Procedure: ENDOVEIN HARVEST OF GREATER SAPHENOUS VEIN;  Surgeon: Rexene Alberts, MD;  Location: [facility];  Service: Open Heart Surgery;  Laterality: Right;  ? LEFT HEART CATH AND CORONARY ANGIOGRAPHY N/A 06/22/2018  ?  Procedure: LEFT HEART CATH AND CORONARY ANGIOGRAPHY;  Surgeon: Nigel Mormon, MD;  Location: [facility];  Service: Cardiovascular;  Laterality: N/A;  ? surgical debridement  Right   ? Right foot ulcer  ? TEE WITHOUT CARDIOVERSION N/A 06/25/2018  ? Procedure: TRANSESOPHAGEAL ECHOCARDIOGRAM (TEE);  Surgeon: Rexene Alberts, MD;  Location: [facility];  Service: Open Heart Surgery;  Laterality: N/A;  ? ? ?Home Medications:  ?No outpatient medications have been marked as taking for the 12/04/21 encounter [facility] Encounter).  ? ? ?Allergies:  ?Allergies  ?Allergen Reactions  ? Apricot Kernel Oil [Prunus] Swelling  ?  THROAT  ? Cherry Swelling  ?  THROAT  ? Jardiance [Empagliflozin] Diarrhea and Nausea And Vomiting  ? Other Other (See Comments)  ?  Fruits with a pit. Throat swells up. Can have them if cooked   ? Peach [Prunus Persica] Swelling  ?  THROAT  ? Plum Pulp Swelling  ?  THROAT  ? ? ?Family History  ?Problem Relation Age of Onset  ? Diabetes Mother   ? Stroke Father   ? Hypertension Father   ? Hearing loss Father   ? Colon polyps Father   ? Colon cancer Neg Hx   ? Esophageal cancer Neg Hx   ? Prostate cancer Neg Hx   ? ? ?Social History:  reports that he has never smoked. He has  never used smokeless tobacco. He reports current alcohol use of about 2.0 standard drinks per week. He reports that he does not use drugs. ? ?ROS: ?A complete review of systems was performed.  All systems are negative except for pertinent findings as noted. ? ?Physical Exam:  ?Vital signs in last 24 hours: ?  ?Constitutional:  Alert and oriented, No acute distress ?Cardiovascular: Regular rate and rhythm, No JVD ?Respiratory: Normal respiratory effort, Lungs clear bilaterally ?GI: Abdomen is soft, nontender, nondistended, no abdominal masses ?GU: No CVA tenderness, ?Lymphatic: No lymphadenopathy ?Neurologic: Grossly intact, no focal deficits ?Psychiatric: Normal mood and affect ? ? ?Laboratory Data:  ?Recent Labs  ?   12/01/21 ?1548  ?WBC 10.6  ?HGB 13.6  ?HCT 38.5  ?PLT 405*  ? ? ?Recent Labs  ?  12/01/21 ?1548  ?NA 132*  ?K 5.3  ?CL 94*  ?GLUCOSE 90  ?BUN 34*  ?CALCIUM 8.7  ?CREATININE 2.58*  ? ? ? ?No results found for this or any previous visit (from the past 24 hour(s)). ?No results found for this or any previous visit (from the past 240 hour(s)). ? ?Renal Function: ?Recent Labs  ?  12/01/21 ?1548  ?CREATININE 2.58*  ? ?Estimated Creatinine Clearance: 41 mL/min (A) (by C-G formula based on SCr of 2.58 mg/dL (H)). ? ?Radiologic Imaging: ?No results found. ? ?Assessment:  ?61 yo M with an obstructing left ureteral stone, elevated creatinine. ? ?Plan:  ?To OR for left stent placement. Planning staged stone treatment later this week ([facility]).  ? ?Donald Pore, MD ?12/04/2021, 3:23 PM  ?[facility] ?Pager: [phone] ? ? ?

## 2021-12-04 NOTE — Telephone Encounter (Signed)
Nurse Assessment ?Nurse: Wynetta Emery, RN, Manuella Ghazi Date/Time Eilene Ghazi Time): 12/01/2021 7:33:44 PM ?Is there an on-call provider listed? ---Yes ?Please list name of person reporting value (Lab ?Employee) and a contact number. ---[facility] [phone] ?Please document the following items: Lab name Lab ?value (read back to lab to verify) Reference range ?for lab value Date and time blood was drawn Collect ?time of birth for bilirubin results ?---[facility], CT abd Pelvis: Mild Left ?hydronephrosis and hydroureter secondary to a 7x10 ?mm stone in the proximal to mid left ureter. Second: ?Thick walled urinary bladder either due to distension ?or cystitis. Test done on 5/5 at 1726. ?Please collect the patient contact information from ?the lab. (name, phone number and address) ?---[facility] ?[address] [address] ?List any special notes provided by lab. ---Non contrast ?Disp. Time (Eastern ?Time) Disposition Final User ?12/01/2021 7:54:08 PM Called On-Call Provider Wynetta Emery, RN, [place] ?12/01/2021 7:59:31 PM Attempt made - message left Wynetta Emery RN, [place] ?12/01/2021 8:01:36 PM Called On-Call Provider Wynetta Emery, RN, [place] ?12/01/2021 8:02:09 PM Attempt made - line busy Wynetta Emery, RN, [place] ?12/01/2021 8:04:10 PM Clinical Call Wynetta Emery, RN, [place] ?12/01/2021 8:04:21 PM Attempt made - line busy Wynetta Emery, RN, [place] ?12/01/2021 8:21:44 PM FINAL ATTEMPT MADE - ?message left ?Yes Wynetta Emery, RN, Manuella Ghazi ?PLEASE NOTE: All timestamps contained within this report are represented as [place] Standard Time. ?CONFIDENTIALTY NOTICE: This fax transmission is intended only for the addressee. It contains information that is legally privileged, confidential or ?otherwise protected from use or disclosure. If you are not the intended recipient, you are strictly prohibited from reviewing, disclosing, copying using ?or disseminating any of this information or taking any action in reliance on or regarding this information.  If you have received this fax in error, please ?notify us immediately by telephone so that we can arrange for its return to [place]. Phone: [phone], Toll-Free: [phone], Fax: [phone] ?Page: 2 of 2 ?Call Id: [id number] ?Comments ?User: Patrina Levering, RN Date/Time Eilene Ghazi Time): 12/01/2021 8:21:34 PM ?Dr Raoul Pitch notified unable to reach pt. ?Paging ?DoctorName Phone DateTime Result/ ?Outcome Message Type Notes ?Howard Pouch [phone] ?12/01/2021 ?7:54:08 ?PM ?Called On ?Call Provider - ?Reached ?Doctor Paged ?Kuneff, Renee ?12/01/2021 ?7:57:45 ?PM ?Spoke with On ?Call - General Message Result Notified of results request pt be ?notified to go go ED ?Howard Pouch [phone] ?12/01/2021 ?8:01:35 ?PM ?Called On ?Call Provider - ?Reached ?Doctor Paged ?Kuneff, Renee ?12/01/2021 ?8:01:53 ?PM ?Spoke with On ?Call - General Message Result notified of results ?Kuneff, Renee ?12/01/2021 ?8:02:56 ?PM ?Spoke with On ?Call - General Message Result ?

## 2021-12-04 NOTE — Discharge Instructions (Addendum)

## 2021-12-04 NOTE — Transfer of Care (Signed)
Immediate Anesthesia Transfer of Care Note ? ?[name] ? ?Procedure(s) Performed: Procedure(s): ?CYSTOSCOPY/RETROGRADE/ STENT PLACEMENT (Left) ? ?Patient Location: PACU ? ?Anesthesia Type:General ? ?Level of Consciousness: Alert, Awake, Oriented ? ?Airway & Oxygen Therapy: Patient Spontanous Breathing ? ?Post-op Assessment: Report given to RN ? ?Post vital signs: Reviewed and stable ? ?Last Vitals:  ?Vitals:  ? 12/04/21 1544  ?BP: 118/74  ?Pulse: 87  ?Resp: 16  ?Temp: 37.2 ?C  ?SpO2: 96%  ? ? ?Complications: No apparent anesthesia complications ? ? ?

## 2021-12-04 NOTE — Telephone Encounter (Signed)
? ?  Pre-operative Risk Assessment  ?  ?Patient Name: Joshua Branch  ?DOB: 1961-01-04 ?MRN: [id number]  ? ?  ? ?Request for Surgical Clearance   ? ?Procedure:   Left Extracorporeal [place] Wave Liphotripsy ? ?Date of Surgery:  Clearance 12/07/21                              ?   ?Surgeon:  Dr. Terrilee Files ?Surgeon's Group or Practice Name:  [facility] ?Phone number:  [phone] ?Fax number:  [phone] ?  ?Type of Clearance Requested:   ?- Medical  ?  ?Type of Anesthesia:  Local  ?  ?Additional requests/questions:  Please advise surgeon/provider what medications should be held. Aspirin - Held 72 hrs prior ? ?Signed, ?Belisicia T Harris   ?12/04/2021, 1:15 PM  ? ?

## 2021-12-04 NOTE — Anesthesia Postprocedure Evaluation (Signed)
Anesthesia Post Note ? ?[name] ? ?Procedure(s) Performed: CYSTOSCOPY/RETROGRADE/ STENT PLACEMENT (Left) ? ?  ? ?Patient location during evaluation: PACU ?Anesthesia Type: General ?Level of consciousness: awake ?Pain management: pain level controlled ?Vital Signs Assessment: post-procedure vital signs reviewed and stable ?Respiratory status: spontaneous breathing, nonlabored ventilation, respiratory function stable and patient connected to nasal cannula oxygen ?Cardiovascular status: blood pressure returned to baseline and stable ?Postop Assessment: no apparent nausea or vomiting ?Anesthetic complications: no ? ? ?No notable events documented. ? ?Last Vitals:  ?Vitals:  ? 12/04/21 1900 12/04/21 1930  ?BP: 140/86 137/85  ?Pulse: 85 85  ?Resp: 18 15  ?Temp: 36.8 ?C 36.9 ?C  ?SpO2: 95% 96%  ?  ?Last Pain:  ?Vitals:  ? 12/04/21 1930  ?TempSrc:   ?PainSc: 0-No pain  ? ? ?  ?  ?  ?  ?  ?  ? ?Shatisha Falter P Claire Dolores ? ? ? ? ?

## 2021-12-05 ENCOUNTER — Encounter (HOSPITAL_BASED_OUTPATIENT_CLINIC_OR_DEPARTMENT_OTHER): Payer: Self-pay | Admitting: Urology

## 2021-12-05 ENCOUNTER — Ambulatory Visit (INDEPENDENT_AMBULATORY_CARE_PROVIDER_SITE_OTHER): Payer: 59 | Admitting: Cardiovascular Disease

## 2021-12-05 ENCOUNTER — Encounter (HOSPITAL_COMMUNITY): Payer: Self-pay | Admitting: Urology

## 2021-12-05 ENCOUNTER — Other Ambulatory Visit (HOSPITAL_BASED_OUTPATIENT_CLINIC_OR_DEPARTMENT_OTHER): Payer: Self-pay

## 2021-12-05 ENCOUNTER — Encounter: Payer: Self-pay | Admitting: Orthopedic Surgery

## 2021-12-05 DIAGNOSIS — Z951 Presence of aortocoronary bypass graft: Secondary | ICD-10-CM | POA: Diagnosis not present

## 2021-12-05 DIAGNOSIS — E782 Mixed hyperlipidemia: Secondary | ICD-10-CM | POA: Diagnosis not present

## 2021-12-05 DIAGNOSIS — I10 Essential (primary) hypertension: Secondary | ICD-10-CM

## 2021-12-05 NOTE — Assessment & Plan Note (Signed)
History of hyperlipidemia on high-dose statin therapy with lipid profile performed 05/01/2021 revealed a total cholesterol 124 with a triglyceride level of 229. ?

## 2021-12-05 NOTE — Progress Notes (Signed)
? ? ? ?12/05/2021 ?Joshua Branch   ?March 25, 1961  ?[id number] ? ?Primary Physician Saguier, Percell Miller, PA-C ?Primary Cardiologist: Joshua Harp MD Joshua Branch, [place] ? ?HPI:  Joshua Branch is a 61 y.o.  moderately overweight divorced Caucasian male father of 2 children who is self-referred to be established in my practice for ongoing cardiovascular care.  I last saw saw him for a virtual telemedicine visit 11/17/2018.  He did see Joshua Sims, NP August 2022.  He is a Optician, dispensing.  His wife is an occupational therapist at [employer].  His primary provider is Joshua Mayo PA-C in [place].  His risk factors include treated diabetes, hypertension and hyperlipidemia his mother did have a myocardial infarction.  He presented on 06/22/2018 with non-STEMI and cath performed radially by Dr. Randa Branch revealed three-vessel disease.  His EF was normal though he did have apical wall motion abnormality.  3 days later he underwent coronary artery bypass grafting x4 by Dr. Roxy Branch with an excellent result.  He recuperated nicely. ?  ?Since I saw him 3 years ago he continues to do well.  He is very active and plays tennis without symptoms.  He does have a diabetic toe which is being treated by an orthopedic surgeon.  He also has nephrolithiasis and is scheduled for lithotripsy.  He denies chest pain or shortness of breath. ? ? ?Current Meds  ?Medication Sig  ? albuterol (VENTOLIN HFA) 108 (90 Base) MCG/ACT inhaler Inhale 2 puffs into the lungs every 6 (six) hours as needed for wheezing or shortness of breath (Cough).  ? atorvastatin (LIPITOR) 80 MG tablet Take 1 tablet (80 mg total) by mouth daily.  ? benzonatate (TESSALON) 100 MG capsule Take 1 capsule (100 mg total) by mouth 3 (three) times daily as needed for cough.  ? Blood Glucose Monitoring Suppl (FREESTYLE LITE) DEVI   ? budesonide-formoterol (SYMBICORT) 160-4.5 MCG/ACT inhaler Inhale 2 puffs into the lungs 2 (two) times daily.  ? Coenzyme Q10 (CO  Q-10) 300 MG CAPS Take 300 mg by mouth daily.  ? COVID-19 mRNA bivalent vaccine, Pfizer, injection Inject into the muscle.  ? glipiZIDE (GLUCOTROL XL) 5 MG 24 hr tablet Take 1 tablet (5 mg total) by mouth daily before breakfast.  ? glucose blood (FREESTYLE LITE) test strip USE AS INSTRUCTED TO CHECK 1-2X A DAY  ? hydrochlorothiazide (MICROZIDE) 12.5 MG capsule TAKE 1 CAPSULE (12.5 MG TOTAL) BY MOUTH DAILY. (Patient taking differently: Take 12.5 mg by mouth daily.)  ? HYDROcodone-acetaminophen (NORCO/VICODIN) 5-325 MG tablet Take 2 tablets by mouth every 6 (six) hours as needed for moderate pain.  ? influenza vac split quadrivalent PF (FLUARIX) 0.5 ML injection Inject into the muscle.  ? Lancets MISC Check sugars 3 times a day E11.9  ? levocetirizine (XYZAL) 5 MG tablet Take 1 tablet (5 mg total) by mouth every evening.  ? lisinopril (ZESTRIL) 10 MG tablet TAKE 1 TABLET (10 MG TOTAL) BY MOUTH DAILY. (Patient taking differently: Take 10 mg by mouth daily.)  ? metFORMIN (GLUCOPHAGE) 500 MG tablet Take 3 tablets by mouth daily as advised (Patient taking differently: Take 1,500 mg by mouth daily.)  ? metoprolol tartrate (LOPRESSOR) 25 MG tablet TAKE 1 TABLET (25 MG TOTAL) BY MOUTH 2 (TWO) TIMES DAILY. (Patient taking differently: Take 25 mg by mouth 2 (two) times daily.)  ? Multiple Vitamin (MULTIVITAMIN WITH MINERALS) TABS tablet Take 1 tablet by mouth daily.  ? nitroGLYCERIN (NITRODUR - DOSED IN MG/24 HR) 0.2 mg/hr  patch Place 1 patch (0.2 mg total) onto the skin daily.  ? pentoxifylline (TRENTAL) 400 MG CR tablet Take 1 tablet (400 mg total) by mouth 3 (three) times daily with meals.  ? sildenafil (REVATIO) 20 MG tablet TAKE 2-5 TABLETS BY MOUTH ONE HOUR BEFORE SEX AS NEEDED **MAX OF 5 TABLETS WITHIN A 24 HOUR PERIOD** (Patient taking differently: Take 20 mg by mouth daily as needed (for erectile dysfunction).)  ? sulfamethoxazole-trimethoprim (BACTRIM DS) 800-160 MG tablet Take 1 tablet by mouth 2 (two) times daily.   ? tadalafil (CIALIS) 10 MG tablet Take 1 tablet (10 mg total) by mouth every other day as needed for erectile dysfunction.  ? tamsulosin (FLOMAX) 0.4 MG CAPS capsule Take 1 capsule by mouth once daily (Patient taking differently: Take 0.4 mg by mouth daily.)  ? Testosterone 30 MG/ACT SOLN Apply 2 pumps on to the skin once daily (Patient taking differently: Apply 2 pumps onto the skin daily)  ? vitamin B-12 (CYANOCOBALAMIN) 1000 MCG tablet Take 1,000 mcg by mouth daily.  ? [DISCONTINUED] dapagliflozin propanediol (FARXIGA) 10 MG TABS tablet Take 1 tablet (10 mg total) by mouth daily before breakfast.  ?  ? ?Allergies  ?Allergen Reactions  ? Apricot Kernel Oil [Prunus] Swelling  ?  THROAT  ? Cherry Swelling  ?  THROAT  ? Jardiance [Empagliflozin] Diarrhea and Nausea And Vomiting  ? Other Other (See Comments)  ?  Fruits with a pit. Throat swells up. Can have them if cooked   ? Peach [Prunus Persica] Swelling  ?  THROAT  ? Plum Pulp Swelling  ?  THROAT  ? Wilder Glade [Dapagliflozin] Nausea And Vomiting  ? ? ?Social History  ? ?Socioeconomic History  ? Marital status: Married  ?  Spouse name: Joshua Branch  ? Number of children: 2  ? Years of education: Not on file  ? Highest education level: Professional school degree (e.g., MD, DDS, DVM, JD)  ?Occupational History  ? Occupation: attorney  ?Tobacco Use  ? Smoking status: Never  ? Smokeless tobacco: Never  ?Vaping Use  ? Vaping Use: Never used  ?Substance and Sexual Activity  ? Alcohol use: Yes  ?  Alcohol/week: 2.0 standard drinks  ?  Types: 2 Cans of beer per week  ?  Comment: 2 cans of beer or 2 borbon  ? Drug use: Never  ? Sexual activity: Not on file  ?Other Topics Concern  ? Not on file  ?Social History Narrative  ? Not on file  ? ?Social Determinants of Health  ? ?Financial Resource Strain: Not on file  ?Food Insecurity: Not on file  ?Transportation Needs: Not on file  ?Physical Activity: Not on file  ?Stress: Not on file  ?Social Connections: Not on file  ?Intimate Partner  Violence: Not on file  ?  ? ?Review of Systems: ?General: negative for chills, fever, night sweats or weight changes.  ?Cardiovascular: negative for chest pain, dyspnea on exertion, edema, orthopnea, palpitations, paroxysmal nocturnal dyspnea or shortness of breath ?Dermatological: negative for rash ?Respiratory: negative for cough or wheezing ?Urologic: negative for hematuria ?Abdominal: negative for nausea, vomiting, diarrhea, bright red blood per rectum, melena, or hematemesis ?Neurologic: negative for visual changes, syncope, or dizziness ?All other systems reviewed and are otherwise negative except as noted above. ? ? ? ?Blood pressure 134/86, pulse 87, height 6' 2.5" (1.892 m), weight 234 lb (106.1 kg).  ?General appearance: alert and no distress ?Neck: no adenopathy, no carotid bruit, no JVD, supple, symmetrical, trachea midline, and  thyroid not enlarged, symmetric, no tenderness/mass/nodules ?Lungs: clear to auscultation bilaterally ?Heart: regular rate and rhythm, S1, S2 normal, no murmur, click, rub or gallop ?Extremities: extremities normal, atraumatic, no cyanosis or edema ?Pulses: 2+ and symmetric ?Skin: Skin color, texture, turgor normal. No rashes or lesions ?Neurologic: Grossly normal ? ?EKG not performed today ? ?ASSESSMENT AND PLAN:  ? ?HTN (hypertension) ?History of essential hypertension a blood pressure measured today at 134/86.  He is on hydrochlorothiazide, lisinopril and metoprolol. ? ?Hyperlipidemia ?History of hyperlipidemia on high-dose statin therapy with lipid profile performed 05/01/2021 revealed a total cholesterol 124 with a triglyceride level of 229. ? ?S/P CABG x 4 ?History of CAD status post non-STEMI 06/18/2018.  He underwent catheterization by Dr. Randa Branch radially revealing three-vessel disease with preserved ejection fraction and apical wall motion abnormality.  3 days later he underwent CABG x4 by Dr. Alfonse Spruce with an excellent result.  He recuperated nicely.  Unfortunately,  because of foot issues he was not able to participate in cardiac rehab.  He is very active and plays tennis with no symptoms. ? ? ? ? ?Joshua Harp MD FACP,FACC,FAHA, FSCAI ?12/05/2021 ?10:34 AM ?

## 2021-12-05 NOTE — Progress Notes (Signed)
RN spoke with Greg Cutter with [facility] to ask about patient medications and cardiac clearance. Coni stated that the office did not tell patient to stop his medications and that he does need to take the medications that he is allowed before the procedure. Patient did not answer. RN left message on patients voicemail to not take Losartan or any of his diabetic medications the morning of his procedure. Patient was also advised to go to [facility] to pick up his blue folder.  ?

## 2021-12-05 NOTE — Assessment & Plan Note (Signed)
History of CAD status post non-STEMI 06/18/2018.  He underwent catheterization by Dr. Randa Lynn radially revealing three-vessel disease with preserved ejection fraction and apical wall motion abnormality.  3 days later he underwent CABG x4 by Dr. Alfonse Spruce with an excellent result.  He recuperated nicely.  Unfortunately, because of foot issues he was not able to participate in cardiac rehab.  He is very active and plays tennis with no symptoms. ?

## 2021-12-05 NOTE — Telephone Encounter (Signed)
Is asking that our pre opp give them a call. Please advise  ?

## 2021-12-05 NOTE — Patient Instructions (Signed)
Medication Instructions:  ? ?-Stop clopidogrel (plavix). ? ?*If you need a refill on your cardiac medications before your next appointment, please call your pharmacy* ? ? ? ?Follow-Up: ?At [facility], you and your health needs are our priority.  As part of our continuing mission to provide you with exceptional heart care, we have created designated Provider Care Teams.  These Care Teams include your primary Cardiologist (physician) and Advanced Practice Providers (APPs -  Physician Assistants and Nurse Practitioners) who all work together to provide you with the care you need, when you need it. ? ?We recommend signing up for the patient portal called "MyChart".  Sign up information is provided on this After Visit Summary.  MyChart is used to connect with patients for Virtual Visits (Telemedicine).  Patients are able to view lab/test results, encounter notes, upcoming appointments, etc.  Non-urgent messages can be sent to your provider as well.   ?To learn more about what you can do with MyChart, go to [email or web address].   ? ?Your next appointment:   ?12 month(s) ? ?The format for your next appointment:   ?In Person ? ?Provider:   ?Quay Burow, MD ? ? ? ?Other Instructions ?You are cleared for urologic procedure. ?

## 2021-12-05 NOTE — Assessment & Plan Note (Signed)
History of essential hypertension a blood pressure measured today at 134/86.  He is on hydrochlorothiazide, lisinopril and metoprolol. ?

## 2021-12-05 NOTE — Telephone Encounter (Signed)
Marlowe Kays from [facility] called in and stated that pt has a procedure thursday and they have some questions about the clearance .  She stated pt must have a EKG before they will do the procedure and they have a question about his meds.  She is requesting a call back asap.  Pt was seen by Dr Gwenlyn Found today.   ? ?Best number [phone] Ext [phone] ?

## 2021-12-05 NOTE — Progress Notes (Signed)
Patient phone call complete. Patient procedure date and arrival time verified. Patient verified allergies and medications that he is currently taking. No recent history of chest pain or COVID. Patient denies any shortness of breath and does not use any home oxygen. Patient instructed to take a laxative of choice on Wednesday and to eat a light dinner the night before. Patient instructed not to have Pepto Bismol or NSAIDs 48 hours prior or aspirin 72 hours prior. Patient instructed not to have any alcohol 24 hours prior to procedure. Patient can have clear liquids until 0715 and solids until 0515. Driver secured. ? ?Patient explained to RN during pre-op phone call that he was told to stop all of his medication within the last week. Patient also needs cardiac clearance. There is a note from Dr. Ancil Linsey, but does not give clearance for procedure on 12/07/2021. Coni Mabe called and a message was left on her voicemail to verify that patient is not supposed to take medication and for cardiac clearance. RN told patient that he would be called back with instructions about his medications.  ?

## 2021-12-06 ENCOUNTER — Encounter: Payer: Self-pay | Admitting: *Deleted

## 2021-12-06 ENCOUNTER — Encounter: Payer: Self-pay | Admitting: Cardiovascular Disease

## 2021-12-06 ENCOUNTER — Ambulatory Visit (INDEPENDENT_AMBULATORY_CARE_PROVIDER_SITE_OTHER): Payer: 59 | Admitting: *Deleted

## 2021-12-06 VITALS — HR 91 | Ht 74.5 in | Wt 234.0 lb

## 2021-12-06 DIAGNOSIS — I2510 Atherosclerotic heart disease of native coronary artery without angina pectoris: Secondary | ICD-10-CM

## 2021-12-06 DIAGNOSIS — Z01818 Encounter for other preprocedural examination: Secondary | ICD-10-CM

## 2021-12-06 DIAGNOSIS — Z01810 Encounter for preprocedural cardiovascular examination: Secondary | ICD-10-CM | POA: Diagnosis not present

## 2021-12-06 NOTE — Telephone Encounter (Signed)
Per Almyra Deforest, PA pt needs an EKG for clearance for urologic procedure. ? ?Pt scheduled for nurse visit today at 2pm. Pt verbalizes understanding.  ?

## 2021-12-06 NOTE — Progress Notes (Signed)
? ?  Nurse Visit  ? ?Date of Encounter: 12/06/2021 ?ID: CAINEN BURNHAM, DOB 02-13-1961, MRN [id number] ? ?PCP:  Mackie Pai, PA-C ?  ?[facility] Providers ?Cardiologist:  Dr Quay Burow     ? ? ?Visit Details  ? ?VS:  There were no vitals taken for this visit. , BMI There is no height or weight on file to calculate BMI. ? ?Wt Readings from Last 3 Encounters:  ?12/05/21 234 lb (106.1 kg)  ?12/01/21 242 lb (109.8 kg)  ?11/21/21 246 lb (111.6 kg)  ?  ? ?Reason for visit:  ekg only  ? ?Performed today: Vitals, EKG done , Provider consulted Dr Adora Fridge , and Education N/a ? Patient  was instructed to come to the office today for EKG.  EKG is needed for  cardiac clearance for upcoming extra corporeal shock wave Lithotripsy.  Dr Gwenlyn Found reviewed  EKG. No changes noted.   Patient  was informed and  appointment completed. ? ?Changes (medications, testing, etc.) : n/a ?Length of Visit: 20 minutes ? ? ? ?Medications Adjustments/Labs and Tests Ordered: ekg obtained ? ? ? ? ?Signed, ?Raiford Simmonds, RN  ?12/06/2021 2:42 PM  ?

## 2021-12-06 NOTE — Progress Notes (Signed)
RN spoke with Joshua Branch this morning and she stated that she talked to the cardiology office about patient needing EKG.  Patient scheduled for EKG on 5/10 with cardiology office to give official clearance for procedure on 12/07/2021. Per Joshua, patient aware of appointment that was made for EKG on 5/10. ?

## 2021-12-06 NOTE — Telephone Encounter (Signed)
error 

## 2021-12-07 ENCOUNTER — Other Ambulatory Visit (HOSPITAL_BASED_OUTPATIENT_CLINIC_OR_DEPARTMENT_OTHER): Payer: Self-pay

## 2021-12-07 ENCOUNTER — Ambulatory Visit (HOSPITAL_BASED_OUTPATIENT_CLINIC_OR_DEPARTMENT_OTHER)
Admission: RE | Admit: 2021-12-07 | Discharge: 2021-12-07 | Disposition: A | Discharge status: Home / Self Care | Payer: 59 | Attending: Urology | Admitting: Urology

## 2021-12-07 ENCOUNTER — Other Ambulatory Visit: Payer: Self-pay

## 2021-12-07 ENCOUNTER — Encounter (HOSPITAL_BASED_OUTPATIENT_CLINIC_OR_DEPARTMENT_OTHER)
Admission: RE | Disposition: A | Discharge status: Home / Self Care | Payer: Self-pay | Source: Home / Self Care | Attending: Urology

## 2021-12-07 ENCOUNTER — Encounter (HOSPITAL_BASED_OUTPATIENT_CLINIC_OR_DEPARTMENT_OTHER): Payer: Self-pay | Admitting: Urology

## 2021-12-07 ENCOUNTER — Ambulatory Visit (HOSPITAL_COMMUNITY): Payer: 59

## 2021-12-07 DIAGNOSIS — N201 Calculus of ureter: Secondary | ICD-10-CM | POA: Diagnosis present

## 2021-12-07 DIAGNOSIS — R7989 Other specified abnormal findings of blood chemistry: Secondary | ICD-10-CM | POA: Insufficient documentation

## 2021-12-07 HISTORY — PX: EXTRACORPOREAL SHOCK WAVE LITHOTRIPSY: SHX1557

## 2021-12-07 LAB — GLUCOSE, CAPILLARY: Glucose-Capillary: 136 mg/dL — ABNORMAL HIGH (ref 70–99)

## 2021-12-07 SURGERY — LITHOTRIPSY, ESWL
Anesthesia: LOCAL | Laterality: Left

## 2021-12-07 MED ORDER — SODIUM CHLORIDE 0.9 % IV SOLN: INTRAVENOUS | Status: DC

## 2021-12-07 MED ORDER — CIPROFLOXACIN HCL 500 MG PO TABS
500.0000 mg | ORAL_TABLET | ORAL | Status: AC
  Administered 2021-12-07: 500 mg via ORAL

## 2021-12-07 MED ORDER — DIPHENHYDRAMINE HCL 25 MG PO CAPS
25.0000 mg | ORAL_CAPSULE | ORAL | Status: AC
  Administered 2021-12-07: 25 mg via ORAL

## 2021-12-07 MED ORDER — DIPHENHYDRAMINE HCL 25 MG PO CAPS
ORAL_CAPSULE | ORAL | Status: AC
  Filled 2021-12-07: qty 1

## 2021-12-07 MED ORDER — CIPROFLOXACIN HCL 500 MG PO TABS
ORAL_TABLET | ORAL | Status: AC
  Filled 2021-12-07: qty 1

## 2021-12-07 MED ORDER — DIAZEPAM 5 MG PO TABS
10.0000 mg | ORAL_TABLET | ORAL | Status: AC
  Administered 2021-12-07: 10 mg via ORAL

## 2021-12-07 MED ORDER — DIAZEPAM 5 MG PO TABS
ORAL_TABLET | ORAL | Status: AC
  Filled 2021-12-07: qty 2

## 2021-12-07 NOTE — Interval H&P Note (Signed)
History and Physical Interval Note: ? ?12/07/2021 ?11:26 AM ? ?Joshua Branch  has presented today for surgery, with the diagnosis of LEFT URETERAL STONE.  The various methods of treatment have been discussed with the patient and family. After consideration of risks, benefits and other options for treatment, the patient has consented to  Procedure(s): ?EXTRACORPOREAL SHOCK WAVE LITHOTRIPSY (ESWL) (Left) as a surgical intervention.  [name] history has been reviewed, patient examined, no change in status, stable for surgery.  I have reviewed the patient's chart and labs. He is well. Some stent pain. Voiding more urine and easier after stent placement. No fever. Stone remains left prox ureter. 10 mm.  Questions were answered to the patient's satisfaction.   ? ? ?Festus Aloe ? ? ?

## 2021-12-07 NOTE — Op Note (Signed)
Left proximal 10 mm stone  ? ?Left ESWL ? ?Findings: Pt tolerated well. Excellent energy delivery. The stone did not break up well visually. We slowed the rate. That seemed to help as the stone faded. He may need a staged procedure if he fails to pass the stone or stone fragments.  ?

## 2021-12-08 ENCOUNTER — Ambulatory Visit: Payer: 59 | Admitting: Medical

## 2021-12-08 ENCOUNTER — Other Ambulatory Visit (HOSPITAL_BASED_OUTPATIENT_CLINIC_OR_DEPARTMENT_OTHER): Payer: Self-pay

## 2021-12-08 ENCOUNTER — Encounter (HOSPITAL_BASED_OUTPATIENT_CLINIC_OR_DEPARTMENT_OTHER): Payer: Self-pay | Admitting: Urology

## 2021-12-11 ENCOUNTER — Other Ambulatory Visit (HOSPITAL_BASED_OUTPATIENT_CLINIC_OR_DEPARTMENT_OTHER): Payer: Self-pay

## 2021-12-12 ENCOUNTER — Other Ambulatory Visit (HOSPITAL_BASED_OUTPATIENT_CLINIC_OR_DEPARTMENT_OTHER): Payer: Self-pay

## 2021-12-14 ENCOUNTER — Ambulatory Visit
Admission: RE | Admit: 2021-12-14 | Discharge: 2021-12-14 | Disposition: A | Discharge status: Home / Self Care | Payer: 59 | Source: Ambulatory Visit | Attending: Orthopedic Surgery | Admitting: Orthopedic Surgery

## 2021-12-14 DIAGNOSIS — M86272 Subacute osteomyelitis, left ankle and foot: Secondary | ICD-10-CM

## 2021-12-15 ENCOUNTER — Telehealth: Payer: Self-pay

## 2021-12-15 NOTE — Telephone Encounter (Signed)
Janie from [facility] Radiology called concerning MRI left foot. Patient just finished with exam and wanted Dr.Duda to see report ASAP. Thanks!

## 2021-12-18 ENCOUNTER — Ambulatory Visit (INDEPENDENT_AMBULATORY_CARE_PROVIDER_SITE_OTHER): Payer: 59 | Admitting: Orthopedic Surgery

## 2021-12-18 ENCOUNTER — Encounter: Payer: Self-pay | Admitting: Orthopedic Surgery

## 2021-12-18 DIAGNOSIS — L02612 Cutaneous abscess of left foot: Secondary | ICD-10-CM | POA: Diagnosis not present

## 2021-12-18 DIAGNOSIS — M86272 Subacute osteomyelitis, left ankle and foot: Secondary | ICD-10-CM

## 2021-12-18 NOTE — Telephone Encounter (Signed)
Pt was in the office today and he will be scheduled for a transmet amputation.

## 2021-12-18 NOTE — Progress Notes (Signed)
Office Visit Note   [name]           Date of Birth: [birth year]           MRN: [id number] Visit Date: 12/18/2021              Requested by: Mackie Pai, PA-C [address] [place],  [place] [address] PCP: Mackie Pai, PA-C  Chief Complaint  Patient presents with   Left Foot - Follow-up      HPI: Patient is a 61 year old gentleman who presents in follow-up for ulceration beneath the fourth metatarsal head left foot.  Patient is status post a third ray amputation.  Patient has been using antibiotics as well as a nitroglycerin patch.  Assessment & Plan: Visit Diagnoses:  1. Subacute osteomyelitis, left ankle and foot ([place])   2. Cutaneous abscess of left foot     Plan: With the abscess and osteomyelitis of the fourth metatarsal head and previous third ray amputation I have recommended proceeding with a transmetatarsal amputation on the left.  Risk and benefits were discussed including risk of the wound not healing need for additional surgery the importance of nonweightbearing.  Patient states he understands wished to proceed at this time patient states he is got a scooter to be nonweightbearing.  Follow-Up Instructions: Return in about 1 week (around 12/25/2021).   Ortho Exam  Patient is alert, oriented, no adenopathy, well-dressed, normal affect, normal respiratory effort. Examination patient has a palpable dorsalis pedis and posterior tibial pulse he has good hair growth on the foot.  He has a large ulcer beneath the fourth metatarsal head.  There is no ascending cellulitis there is collapse of the base of the first metatarsal medial cuneiform this is asymptomatic.  Review of the MRI scan shows osteomyelitis of the fourth metatarsal head as well as an abscess.  Imaging: No results found. No images are attached to the encounter.  Labs: Lab Results  Component Value Date   HGBA1C 7.3 (A) 06/14/2021   HGBA1C 7.4 (A) 01/27/2021   HGBA1C 6.9  (H) 04/15/2020   REPTSTATUS 01/16/2021 FINAL 01/11/2021   CULT  01/11/2021    RARE METHICILLIN RESISTANT STAPHYLOCOCCUS AUREUS NO ANAEROBES ISOLATED Performed at [facility], [place] [address], [place], [place] [address]    LABORGA METHICILLIN RESISTANT STAPHYLOCOCCUS AUREUS 01/11/2021     Lab Results  Component Value Date   ALBUMIN 4.3 05/01/2021   ALBUMIN 3.9 05/08/2019   ALBUMIN 2.7 (L) 06/23/2018    Lab Results  Component Value Date   MG 2.1 05/08/2019   MG 2.2 06/26/2018   MG 3.0 (H) 06/26/2018   No results found for: VD25OH  No results found for: PREALBUMIN    Latest Ref Rng & Units 12/01/2021    3:48 PM 11/21/2021   12:14 PM 05/01/2021    8:58 AM  CBC EXTENDED  WBC 3.8 - 10.8 Thousand/uL 10.6   11.1   8.6    RBC 4.20 - 5.80 Million/uL 4.27   4.49   5.48    Hemoglobin 13.2 - 17.1 g/dL 13.6   14.0   16.9    HCT 38.5 - 50.0 % 38.5   42.1   50.5    Platelets 140 - 400 Thousand/uL 405   251.0   251.0    NEUT# 1,500 - 7,800 cells/uL 7,780   9.0   5.6    Lymph# 850 - 3,900 cells/uL 1,866   1.1   2.1  There is no height or weight on file to calculate BMI.  Orders:  No orders of the defined types were placed in this encounter.  No orders of the defined types were placed in this encounter.    Procedures: No procedures performed  Clinical Data: No additional findings.  ROS:  All other systems negative, except as noted in the HPI. Review of Systems  Objective: Vital Signs: There were no vitals taken for this visit.  Specialty Comments:  No specialty comments available.  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 ([place]) 01/18/2021   Osteomyelitis of third toe of left foot ([name]    Cutaneous abscess of left foot    Osteomyelitis of second toe of right foot ([place])    Poorly controlled type 2 diabetes mellitus with circulatory disorder ([place]) 07/15/2018   Numbness  in both hands 07/15/2018   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   Acute coronary syndrome ([place]) 06/22/2018   Nonhealing skin ulcer ([place]) 06/22/2018   Coronary artery disease involving native coronary artery with unstable angina pectoris ([place]) 06/22/2018   Acute myocardial infarction ([place]) 06/22/2018   Hyperlipidemia    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   Vertigo 08/20/2016   Myringotomy tube status 08/20/2016   Ear fullness, left 08/20/2016   Asymmetrical left sensorineural hearing loss 02/06/2016   Neuropathy of left foot 02/03/2015   Charcot's joint of left foot, non-diabetic 02/03/2015   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   Cramping of hands 06/02/2014   Eustachian tube dysfunction 06/02/2014   HTN (hypertension) 05/07/2014   Obstructive sleep apnea 05/07/2014   Past Medical History:  Diagnosis Date   Acute myocardial infarction ([place]) 06/22/2018   Allergy    Basal cell carcinoma    Charcot's joint of left foot, non-diabetic    Coronary artery disease involving native coronary artery with unstable angina pectoris ([place]) 06/22/2018   Diabetes ([place])    type 2   GERD (gastroesophageal reflux disease)    History of kidney stones    passed   Hyperlipidemia    Hypertension    Neuropathy of left foot    Pneumonia    As a child   Right foot ulcer ([place])    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   LIMA to LAD, SVG to Diag, SVG to OM2, SVG to [name], EVH via right thigh and leg   Type II diabetes mellitus with complication, uncontrolled    Uncontrolled type 2 diabetes mellitus     Family History  Problem Relation Age of Onset   Diabetes Mother    Stroke Father    Hypertension Father    Hearing loss Father    Colon polyps Father    Colon cancer Neg Hx    Esophageal cancer Neg Hx    Prostate cancer Neg Hx     Past Surgical History:  Procedure Laterality Date   AMPUTATION Right 01/06/2020   Procedure: RIGHT FOOT 2ND RAY AMPUTATION;  Surgeon: Newt Minion, MD;  Location: [facility];   Service: Orthopedics;  Laterality: Right;   AMPUTATION Left 01/11/2021   Procedure: LEFT FOOT 3RD RAY AMPUTATION;  Surgeon: Newt Minion, MD;  Location: [facility];  Service: Orthopedics;  Laterality: Left;   COLONOSCOPY W/ POLYPECTOMY     CORONARY ARTERY BYPASS GRAFT N/A 06/25/2018   Procedure: CORONARY ARTERY BYPASS GRAFTING (CABG) TIMES FOUR USING LEFT INTERNAL MAMMARY ARTERY AND  RIGHT ENDOSCOPICALLY HARVESTED SAPHENOUS VEIN;  Surgeon: Rexene Alberts, MD;  Location: [facility];  Service: Open Heart Surgery;  Laterality: N/A;   CYSTOSCOPY WITH STENT PLACEMENT Left 12/04/2021   Procedure: CYSTOSCOPY/RETROGRADE/ STENT PLACEMENT;  Surgeon: Vira Agar, MD;  [facility];  Service: Urology;  Laterality: Left;   ENDOVEIN HARVEST OF GREATER SAPHENOUS VEIN Right 06/25/2018   Procedure: ENDOVEIN HARVEST OF GREATER SAPHENOUS VEIN;  Surgeon: Rexene Alberts, MD;  Location: [facility];  Service: Open Heart Surgery;  Laterality: Right;   EXTRACORPOREAL SHOCK WAVE LITHOTRIPSY Left 12/07/2021   Procedure: EXTRACORPOREAL SHOCK WAVE LITHOTRIPSY (ESWL);  Surgeon: Festus Aloe, MD;  [facility];  Service: Urology;  Laterality: Left;   LEFT HEART CATH AND CORONARY ANGIOGRAPHY N/A 06/22/2018   Procedure: LEFT HEART CATH AND CORONARY ANGIOGRAPHY;  Surgeon: Nigel Mormon, MD;  Location: [facility];  Service: Cardiovascular;  Laterality: N/A;   surgical debridement  Right    Right foot ulcer   TEE WITHOUT CARDIOVERSION N/A 06/25/2018   Procedure: TRANSESOPHAGEAL ECHOCARDIOGRAM (TEE);  Surgeon: Rexene Alberts, MD;  Location: [facility];  Service: Open Heart Surgery;  Laterality: N/A;   Social History   Occupational History   Occupation: attorney  Tobacco Use   Smoking status: Never   Smokeless tobacco: Never  Vaping Use   Vaping Use: Never used  Substance and Sexual Activity   Alcohol use: Yes    Alcohol/week: 2.0 standard drinks    Types: 2 Cans of beer per week     Comment: 2 cans of beer or 2 borbon   Drug use: Never   Sexual activity: Not on file

## 2021-12-20 ENCOUNTER — Other Ambulatory Visit: Payer: Self-pay

## 2021-12-20 ENCOUNTER — Telehealth: Payer: Self-pay | Admitting: Orthopedic Surgery

## 2021-12-20 ENCOUNTER — Encounter (HOSPITAL_COMMUNITY): Payer: Self-pay | Admitting: Orthopedic Surgery

## 2021-12-20 NOTE — Progress Notes (Signed)
SDW  PCP - PA E. Saguier  Cardiologist - Dr. Adora Fridge  EP- Denies  Endocrine- Denies  Pulm- Denies  Chest x-ray - 11/21/21 (E)  EKG - 12/06/21 (E)  Stress Test - Denies  ECHO - 06/25/18 (E)  Cardiac Cath - 06/22/18 (E)  AICD-na PM-na LOOP-na  Nerve Stimulator- Denies  Dialysis- Denies  Sleep Study - Yes- Negative CPAP - Denies  LABS- 12/22/21: CBC, BMP, PCR  ASA- Denies  ERAS- Yes- clears until 0430  HA1C- 06/14/21(E): 7.3 Fasting Blood Sugar - 92-114 Checks Blood Sugar _Every Other____ day  Anesthesia- No  Pt denies having chest pain, sob, or fever during the pre-op phone call. All instructions explained to the pt, with a verbal understanding of the material including: as of today,  stop taking all Aspirin (unless instructed by your doctor) and Other Aspirin containing products, Vitamins, Fish oils, and Herbal medications. Also stop all NSAIDS i.e. Advil, Ibuprofen, Motrin, Aleve, Anaprox, Naproxen, BC, Goody Powders, and all Supplements.   WHAT DO I DO ABOUT MY DIABETES MEDICATION?  Do not take Glipizide and Metformin the morning of surgery.  If your CBG is greater than 220 mg/dL, inform the staff upon arrival to Short Stay.  How do I manage my blood sugar before surgery? Check your blood sugar the morning of your surgery when you wake up and every 2 hours until you get to the Short Stay unit. If your blood sugar is less than 70 mg/dL, you will need to treat for low blood sugar: Do not take insulin. Treat a low blood sugar (less than 70 mg/dL) with  cup of clear juice (cranberry or apple), 4 glucose tablets, OR glucose gel. Recheck blood sugar in 15 minutes after treatment (to make sure it is greater than 70 mg/dL). If your blood sugar is not greater than 70 mg/dL on recheck, call [phone]  for further instructions. Report your blood sugar to the short stay nurse when you get to Short Stay.  Reviewed and Endorsed by [facility] Patient Education  Committee, August 2015   Pt also instructed to wear a mask and social distance if he goes out. The opportunity to ask questions was provided.

## 2021-12-20 NOTE — Telephone Encounter (Signed)
Please call for an appt tomorrow with Dr. Sharol Given and let me know what time to open. Thanks!

## 2021-12-20 NOTE — Telephone Encounter (Signed)
Pt called and said that Dr.Duda talked with another physician about this pt and he has some questions he would like answered. He states Sharol Given told him to come in today or tomorrow before procedure.   CB [phone]

## 2021-12-21 ENCOUNTER — Encounter (HOSPITAL_COMMUNITY): Payer: Self-pay | Admitting: Orthopedic Surgery

## 2021-12-21 ENCOUNTER — Ambulatory Visit (INDEPENDENT_AMBULATORY_CARE_PROVIDER_SITE_OTHER): Payer: 59 | Admitting: Orthopedic Surgery

## 2021-12-21 ENCOUNTER — Encounter: Payer: Self-pay | Admitting: Orthopedic Surgery

## 2021-12-21 DIAGNOSIS — M86272 Subacute osteomyelitis, left ankle and foot: Secondary | ICD-10-CM

## 2021-12-21 DIAGNOSIS — L02612 Cutaneous abscess of left foot: Secondary | ICD-10-CM

## 2021-12-21 NOTE — Anesthesia Preprocedure Evaluation (Addendum)
Anesthesia Evaluation  Patient identified by MRN, date of birth, ID band Patient awake    Reviewed: Allergy & Precautions, NPO status , Patient's Chart, lab work & pertinent test results, reviewed documented beta blocker date and time   Airway Mallampati: III  TM Distance: >3 FB Neck ROM: Full    Dental no notable dental hx. (+) Teeth Intact   Pulmonary sleep apnea and Continuous Positive Airway Pressure Ventilation , pneumonia, resolved,    Pulmonary exam normal breath sounds clear to auscultation       Cardiovascular hypertension, Pt. on medications + angina with exertion + CAD, + Past MI and + CABG  Normal cardiovascular exam Rhythm:Regular Rate:Normal  CABG 4v 06/25/2018 MI 06/22/2018   Neuro/Psych Peripheral neuropathy  Neuromuscular disease negative psych ROS   GI/Hepatic Neg liver ROS, GERD  Medicated,  Endo/Other  diabetes, Poorly Controlled, Type 2, Oral Hypoglycemic AgentsHyperlipidemia  Renal/GU Renal InsufficiencyRenal disease  negative genitourinary   Musculoskeletal  (+) Arthritis , Osteoarthritis,  Osteomyelitis left foot Charcot's joint left foot   Abdominal   Peds  Hematology  (+) Blood dyscrasia, anemia ,   Anesthesia Other Findings   Reproductive/Obstetrics                           Anesthesia Physical Anesthesia Plan  ASA: 3  Anesthesia Plan: General   Post-op Pain Management: Precedex, Dilaudid IV, Ketamine IV* and Ofirmev IV (intra-op)*   Induction: Intravenous  PONV Risk Score and Plan: 3 and Treatment may vary due to age or medical condition, Midazolam and Ondansetron  Airway Management Planned: LMA  Additional Equipment: None  Intra-op Plan:   Post-operative Plan: Extubation in OR  Informed Consent: I have reviewed the patients History and Physical, chart, labs and discussed the procedure including the risks, benefits and alternatives for the proposed  anesthesia with the patient or authorized representative who has indicated his/her understanding and acceptance.     Dental advisory given  Plan Discussed with: CRNA and Anesthesiologist  Anesthesia Plan Comments:        Anesthesia Quick Evaluation

## 2021-12-21 NOTE — Progress Notes (Signed)
Office Visit Note   [name]           Date of Birth: 11-Feb-1961           MRN: [id number] Visit Date: 12/21/2021              Requested by: Mackie Pai, PA-C [address] [place],  [place] [address] PCP: Mackie Pai, PA-C  Chief Complaint  Patient presents with   Left Foot - Open Wound    Dicuss transmet surgery scheduled for tomorrow.       HPI: Patient is a 61 year old gentleman who presents in follow-up for ulceration osteomyelitis beneath the fourth metatarsal head.  Patient did obtain a second opinion in [facility] with Dr. Linton Rump and recommendation was made to consider only a fourth ray amputation.  Patient is status post a third ray amputation.  Assessment & Plan: Visit Diagnoses:  1. Subacute osteomyelitis, left ankle and foot ([name])   2. Cutaneous abscess of left foot     Plan: With the dead space and ulcer probing to both the fourth and second metatarsal head recommendation was that a isolated ray amputation is not an option.  Recommended proceeding with a transmetatarsal amputation.  Discussed that with the increasing area of dead space the infection is definitely become worse and may require more extensive debridement will definitely need longer-term antibiotics and a wound VAC postoperatively.  Risk and benefits were discussed patient states he understands wished to proceed at this time.  Follow-Up Instructions: Return in about 1 week (around 12/28/2021).   Ortho Exam  Patient is alert, oriented, no adenopathy, well-dressed, normal affect, normal respiratory effort. Examination patient has increasing cellulitis through the midfoot his foot is warm to the touch.  The ulcer probes 4 cm to the second metatarsal head as well as probing to the fourth metatarsal head.  Patient has increased dead space throughout the forefoot and increased infection from his last examination.  Imaging: No results found. No images are attached to the  encounter.  Labs: Lab Results  Component Value Date   HGBA1C 7.3 (A) 06/14/2021   HGBA1C 7.4 (A) 01/27/2021   HGBA1C 6.9 (H) 04/15/2020   REPTSTATUS 01/16/2021 FINAL 01/11/2021   CULT  01/11/2021    RARE METHICILLIN RESISTANT STAPHYLOCOCCUS AUREUS NO ANAEROBES ISOLATED Performed at [facility], [place] [address], [place], [place] [address]    LABORGA METHICILLIN RESISTANT STAPHYLOCOCCUS AUREUS 01/11/2021     Lab Results  Component Value Date   ALBUMIN 4.3 05/01/2021   ALBUMIN 3.9 05/08/2019   ALBUMIN 2.7 (L) 06/23/2018    Lab Results  Component Value Date   MG 2.1 05/08/2019   MG 2.2 06/26/2018   MG 3.0 (H) 06/26/2018   No results found for: VD25OH  No results found for: PREALBUMIN    Latest Ref Rng & Units 12/01/2021    3:48 PM 11/21/2021   12:14 PM 05/01/2021    8:58 AM  CBC EXTENDED  WBC 3.8 - 10.8 Thousand/uL 10.6   11.1   8.6    RBC 4.20 - 5.80 Million/uL 4.27   4.49   5.48    Hemoglobin 13.2 - 17.1 g/dL 13.6   14.0   16.9    HCT 38.5 - 50.0 % 38.5   42.1   50.5    Platelets 140 - 400 Thousand/uL 405   251.0   251.0    NEUT# 1,500 - 7,800 cells/uL 7,780   9.0  5.6    Lymph# 850 - 3,900 cells/uL 1,866   1.1   2.1       There is no height or weight on file to calculate BMI.  Orders:  No orders of the defined types were placed in this encounter.  No orders of the defined types were placed in this encounter.    Procedures: No procedures performed  Clinical Data: No additional findings.  ROS:  All other systems negative, except as noted in the HPI. Review of Systems  Objective: Vital Signs: There were no vitals taken for this visit.  Specialty Comments:  No specialty comments available.  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 ([name]) 01/18/2021   Osteomyelitis of third toe of left foot ([name]    Cutaneous abscess of left foot    Osteomyelitis of second  toe of right foot ([name])    Poorly controlled type 2 diabetes mellitus with circulatory disorder ([name]) 07/15/2018   Numbness in both hands 07/15/2018   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   Acute coronary syndrome ([name]) 06/22/2018   Nonhealing skin ulcer ([name]) 06/22/2018   Coronary artery disease involving native coronary artery with unstable angina pectoris ([name]) 06/22/2018   Acute myocardial infarction ([name]) 06/22/2018   Hyperlipidemia    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   Vertigo 08/20/2016   Myringotomy tube status 08/20/2016   Ear fullness, left 08/20/2016   Asymmetrical left sensorineural hearing loss 02/06/2016   Neuropathy of left foot 02/03/2015   Charcot's joint of left foot, non-diabetic 02/03/2015   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   Cramping of hands 06/02/2014   Eustachian tube dysfunction 06/02/2014   HTN (hypertension) 05/07/2014   Obstructive sleep apnea 05/07/2014   Past Medical History:  Diagnosis Date   Acute myocardial infarction ([name]) 06/22/2018   Allergy    Basal cell carcinoma    Charcot's joint of left foot, non-diabetic    Coronary artery disease involving native coronary artery with unstable angina pectoris ([name]) 06/22/2018   Diabetes ([name])    type 2   GERD (gastroesophageal reflux disease)    History of kidney stones    passed   Hyperlipidemia    Hypertension    Neuropathy of left foot    Pneumonia    As a child   Right foot ulcer ([name])    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   LIMA to LAD, SVG to Diag, SVG to OM2, SVG to [name], EVH via right thigh and leg   Type II diabetes mellitus with complication, uncontrolled    Uncontrolled type 2 diabetes mellitus     Family History  Problem Relation Age of Onset   Diabetes Mother    Stroke Father    Hypertension Father    Hearing loss Father    Colon polyps Father    Colon cancer Neg Hx    Esophageal cancer Neg Hx    Prostate cancer Neg Hx     Past Surgical History:  Procedure Laterality Date    AMPUTATION Right 01/06/2020   Procedure: RIGHT FOOT 2ND RAY AMPUTATION;  Surgeon: Newt Minion, MD;  Location: [facility];  Service: Orthopedics;  Laterality: Right;   AMPUTATION Left 01/11/2021   Procedure: LEFT FOOT 3RD RAY AMPUTATION;  Surgeon: Newt Minion, MD;  Location: [facility];  Service: Orthopedics;  Laterality: Left;   COLONOSCOPY W/ POLYPECTOMY  CORONARY ARTERY BYPASS GRAFT N/A 06/25/2018   Procedure: CORONARY ARTERY BYPASS GRAFTING (CABG) TIMES FOUR USING LEFT INTERNAL MAMMARY ARTERY AND RIGHT ENDOSCOPICALLY HARVESTED SAPHENOUS VEIN;  Surgeon: Rexene Alberts, MD;  Location: [facility];  Service: Open Heart Surgery;  Laterality: N/A;   CYSTOSCOPY WITH STENT PLACEMENT Left 12/04/2021   Procedure: CYSTOSCOPY/RETROGRADE/ STENT PLACEMENT;  Surgeon: Vira Agar, MD;  [facility];  Service: Urology;  Laterality: Left;   ENDOVEIN HARVEST OF GREATER SAPHENOUS VEIN Right 06/25/2018   Procedure: ENDOVEIN HARVEST OF GREATER SAPHENOUS VEIN;  Surgeon: Rexene Alberts, MD;  Location: [facility];  Service: Open Heart Surgery;  Laterality: Right;   EXTRACORPOREAL SHOCK WAVE LITHOTRIPSY Left 12/07/2021   Procedure: EXTRACORPOREAL SHOCK WAVE LITHOTRIPSY (ESWL);  Surgeon: Festus Aloe, MD;  Location: [facility];  Service: Urology;  Laterality: Left;   LEFT HEART CATH AND CORONARY ANGIOGRAPHY N/A 06/22/2018   Procedure: LEFT HEART CATH AND CORONARY ANGIOGRAPHY;  Surgeon: Nigel Mormon, MD;  Location: [facility];  Service: Cardiovascular;  Laterality: N/A;   surgical debridement  Right    Right foot ulcer   TEE WITHOUT CARDIOVERSION N/A 06/25/2018   Procedure: TRANSESOPHAGEAL ECHOCARDIOGRAM (TEE);  Surgeon: Rexene Alberts, MD;  Location: [facility];  Service: Open Heart Surgery;  Laterality: N/A;   Social History   Occupational History   Occupation: attorney  Tobacco Use   Smoking status: Never   Smokeless tobacco: Never  Vaping Use   Vaping Use: Never used   Substance and Sexual Activity   Alcohol use: Yes    Alcohol/week: 2.0 standard drinks    Types: 2 Cans of beer per week    Comment: 2 cans of beer or 2 borbon   Drug use: Never   Sexual activity: Not on file

## 2021-12-22 ENCOUNTER — Encounter (HOSPITAL_COMMUNITY): Payer: Self-pay | Admitting: Orthopedic Surgery

## 2021-12-22 ENCOUNTER — Other Ambulatory Visit: Payer: Self-pay

## 2021-12-22 ENCOUNTER — Ambulatory Visit (HOSPITAL_COMMUNITY)
Admission: RE | Admit: 2021-12-22 | Discharge: 2021-12-22 | Disposition: A | Discharge status: Home / Self Care | Payer: 59 | Attending: Orthopedic Surgery | Admitting: Orthopedic Surgery

## 2021-12-22 ENCOUNTER — Ambulatory Visit (HOSPITAL_COMMUNITY): Payer: 59 | Admitting: Anesthesiology

## 2021-12-22 ENCOUNTER — Other Ambulatory Visit (HOSPITAL_BASED_OUTPATIENT_CLINIC_OR_DEPARTMENT_OTHER): Payer: Self-pay

## 2021-12-22 ENCOUNTER — Ambulatory Visit (HOSPITAL_BASED_OUTPATIENT_CLINIC_OR_DEPARTMENT_OTHER): Payer: 59 | Admitting: Anesthesiology

## 2021-12-22 ENCOUNTER — Encounter (HOSPITAL_COMMUNITY)
Admission: RE | Disposition: A | Discharge status: Home / Self Care | Payer: Self-pay | Source: Home / Self Care | Attending: Orthopedic Surgery

## 2021-12-22 DIAGNOSIS — M1990 Unspecified osteoarthritis, unspecified site: Secondary | ICD-10-CM | POA: Insufficient documentation

## 2021-12-22 DIAGNOSIS — M869 Osteomyelitis, unspecified: Secondary | ICD-10-CM | POA: Insufficient documentation

## 2021-12-22 DIAGNOSIS — E1165 Type 2 diabetes mellitus with hyperglycemia: Secondary | ICD-10-CM

## 2021-12-22 DIAGNOSIS — Z7984 Long term (current) use of oral hypoglycemic drugs: Secondary | ICD-10-CM | POA: Insufficient documentation

## 2021-12-22 DIAGNOSIS — G4730 Sleep apnea, unspecified: Secondary | ICD-10-CM | POA: Insufficient documentation

## 2021-12-22 DIAGNOSIS — I2510 Atherosclerotic heart disease of native coronary artery without angina pectoris: Secondary | ICD-10-CM | POA: Insufficient documentation

## 2021-12-22 DIAGNOSIS — A5216 Charcot's arthropathy (tabetic): Secondary | ICD-10-CM | POA: Diagnosis not present

## 2021-12-22 DIAGNOSIS — Z79899 Other long term (current) drug therapy: Secondary | ICD-10-CM | POA: Insufficient documentation

## 2021-12-22 DIAGNOSIS — E1169 Type 2 diabetes mellitus with other specified complication: Secondary | ICD-10-CM

## 2021-12-22 DIAGNOSIS — Z951 Presence of aortocoronary bypass graft: Secondary | ICD-10-CM | POA: Diagnosis not present

## 2021-12-22 DIAGNOSIS — K219 Gastro-esophageal reflux disease without esophagitis: Secondary | ICD-10-CM | POA: Insufficient documentation

## 2021-12-22 DIAGNOSIS — L02612 Cutaneous abscess of left foot: Secondary | ICD-10-CM | POA: Insufficient documentation

## 2021-12-22 DIAGNOSIS — E785 Hyperlipidemia, unspecified: Secondary | ICD-10-CM | POA: Diagnosis not present

## 2021-12-22 DIAGNOSIS — I10 Essential (primary) hypertension: Secondary | ICD-10-CM | POA: Insufficient documentation

## 2021-12-22 DIAGNOSIS — E119 Type 2 diabetes mellitus without complications: Secondary | ICD-10-CM | POA: Diagnosis not present

## 2021-12-22 DIAGNOSIS — I252 Old myocardial infarction: Secondary | ICD-10-CM | POA: Insufficient documentation

## 2021-12-22 HISTORY — PX: APPLICATION OF WOUND VAC: SHX5189

## 2021-12-22 HISTORY — PX: AMPUTATION: SHX166

## 2021-12-22 LAB — GLUCOSE, CAPILLARY
Glucose-Capillary: 123 mg/dL — ABNORMAL HIGH (ref 70–99)
Glucose-Capillary: 131 mg/dL — ABNORMAL HIGH (ref 70–99)

## 2021-12-22 LAB — BASIC METABOLIC PANEL
Anion gap: 9 (ref 5–15)
BUN: 28 mg/dL — ABNORMAL HIGH (ref 6–20)
CO2: 27 mmol/L (ref 22–32)
Calcium: 8.6 mg/dL — ABNORMAL LOW (ref 8.9–10.3)
Chloride: 105 mmol/L (ref 98–111)
Creatinine, Ser: 1.25 mg/dL — ABNORMAL HIGH (ref 0.61–1.24)
GFR, Estimated: >60 mL/min (ref >60)
Glucose, Bld: 115 mg/dL — ABNORMAL HIGH (ref 70–99)
Potassium: 3.7 mmol/L (ref 3.5–5.1)
Sodium: 141 mmol/L (ref 135–145)

## 2021-12-22 LAB — CBC
HCT: 34.8 % — ABNORMAL LOW (ref 39.0–52.0)
Hemoglobin: 11.9 g/dL — ABNORMAL LOW (ref 13.0–17.0)
MCH: 31.6 pg (ref 26.0–34.0)
MCHC: 34.2 g/dL (ref 30.0–36.0)
MCV: 92.6 fL (ref 80.0–100.0)
Platelets: 256 10*3/uL (ref 150–400)
RBC: 3.76 MIL/uL — ABNORMAL LOW (ref 4.22–5.81)
RDW: 11.8 % (ref 11.5–15.5)
WBC: 7.7 10*3/uL (ref 4.0–10.5)
nRBC: 0 % (ref 0.0–0.2)

## 2021-12-22 SURGERY — AMPUTATION, FOOT, PARTIAL
Anesthesia: General | Site: Foot | Laterality: Left

## 2021-12-22 MED ORDER — ACETAMINOPHEN 10 MG/ML IV SOLN
INTRAVENOUS | Status: DC | PRN
  Administered 2021-12-22: 1000 mg via INTRAVENOUS

## 2021-12-22 MED ORDER — MIDAZOLAM HCL 2 MG/2ML IJ SOLN
INTRAMUSCULAR | Status: DC | PRN
  Administered 2021-12-22: 1 mg via INTRAVENOUS

## 2021-12-22 MED ORDER — PROPOFOL 10 MG/ML IV BOLUS
INTRAVENOUS | Status: AC
  Filled 2021-12-22: qty 20

## 2021-12-22 MED ORDER — ROPIVACAINE HCL 5 MG/ML IJ SOLN
INTRAMUSCULAR | Status: DC | PRN
  Administered 2021-12-22: 30 mL via PERINEURAL

## 2021-12-22 MED ORDER — ONDANSETRON HCL 4 MG/2ML IJ SOLN
INTRAMUSCULAR | Status: AC
  Filled 2021-12-22: qty 2

## 2021-12-22 MED ORDER — PHENYLEPHRINE 80 MCG/ML (10ML) SYRINGE FOR IV PUSH (FOR BLOOD PRESSURE SUPPORT)
PREFILLED_SYRINGE | INTRAVENOUS | Status: AC
  Filled 2021-12-22: qty 10

## 2021-12-22 MED ORDER — DEXMEDETOMIDINE (PRECEDEX) IN NS 20 MCG/5ML (4 MCG/ML) IV SYRINGE
PREFILLED_SYRINGE | INTRAVENOUS | Status: AC
  Filled 2021-12-22: qty 5

## 2021-12-22 MED ORDER — ACETAMINOPHEN 10 MG/ML IV SOLN
INTRAVENOUS | Status: AC
  Filled 2021-12-22: qty 100

## 2021-12-22 MED ORDER — FENTANYL CITRATE (PF) 100 MCG/2ML IJ SOLN
INTRAMUSCULAR | Status: AC
  Filled 2021-12-22: qty 2

## 2021-12-22 MED ORDER — INSULIN ASPART 100 UNIT/ML IJ SOLN: 0.0000 [IU] | INTRAMUSCULAR | Status: DC | PRN

## 2021-12-22 MED ORDER — PROPOFOL 10 MG/ML IV BOLUS
INTRAVENOUS | Status: DC | PRN
  Administered 2021-12-22: 50 mg via INTRAVENOUS
  Administered 2021-12-22: 150 mg via INTRAVENOUS

## 2021-12-22 MED ORDER — ONDANSETRON HCL 4 MG/2ML IJ SOLN: 4.0000 mg | Freq: Once | INTRAMUSCULAR | Status: DC | PRN

## 2021-12-22 MED ORDER — FENTANYL CITRATE (PF) 250 MCG/5ML IJ SOLN
INTRAMUSCULAR | Status: DC | PRN
  Administered 2021-12-22: 50 ug via INTRAVENOUS

## 2021-12-22 MED ORDER — OXYCODONE HCL 5 MG/5ML PO SOLN: 5.0000 mg | Freq: Once | ORAL | Status: DC | PRN

## 2021-12-22 MED ORDER — CHLORHEXIDINE GLUCONATE 0.12 % MT SOLN
15.0000 mL | Freq: Once | OROMUCOSAL | Status: AC
  Administered 2021-12-22: 15 mL via OROMUCOSAL
  Filled 2021-12-22: qty 15

## 2021-12-22 MED ORDER — LIDOCAINE 2% (20 MG/ML) 5 ML SYRINGE
INTRAMUSCULAR | Status: DC | PRN
Start: 2021-12-22 — End: 2021-12-22
  Administered 2021-12-22: 80 mg via INTRAVENOUS

## 2021-12-22 MED ORDER — OXYCODONE HCL 5 MG PO TABS: 5.0000 mg | ORAL_TABLET | Freq: Once | ORAL | Status: DC | PRN

## 2021-12-22 MED ORDER — LIDOCAINE HCL (PF) 1 % IJ SOLN
INTRAMUSCULAR | Status: AC
  Filled 2021-12-22: qty 30

## 2021-12-22 MED ORDER — LACTATED RINGERS IV SOLN: INTRAVENOUS | Status: DC

## 2021-12-22 MED ORDER — KETAMINE HCL 50 MG/5ML IJ SOSY
PREFILLED_SYRINGE | INTRAMUSCULAR | Status: AC
  Filled 2021-12-22: qty 5

## 2021-12-22 MED ORDER — CEFAZOLIN SODIUM-DEXTROSE 2-4 GM/100ML-% IV SOLN
2.0000 g | INTRAVENOUS | Status: AC
  Administered 2021-12-22: 2 g via INTRAVENOUS
  Filled 2021-12-22: qty 100

## 2021-12-22 MED ORDER — PHENYLEPHRINE 80 MCG/ML (10ML) SYRINGE FOR IV PUSH (FOR BLOOD PRESSURE SUPPORT)
PREFILLED_SYRINGE | INTRAVENOUS | Status: DC | PRN
  Administered 2021-12-22 (×3): 160 ug via INTRAVENOUS
  Administered 2021-12-22: 80 ug via INTRAVENOUS
  Administered 2021-12-22: 160 ug via INTRAVENOUS

## 2021-12-22 MED ORDER — 0.9 % SODIUM CHLORIDE (POUR BTL) OPTIME
TOPICAL | Status: DC | PRN
  Administered 2021-12-22: 1000 mL

## 2021-12-22 MED ORDER — ORAL CARE MOUTH RINSE: 15.0000 mL | Freq: Once | OROMUCOSAL | Status: AC

## 2021-12-22 MED ORDER — LIDOCAINE 2% (20 MG/ML) 5 ML SYRINGE
INTRAMUSCULAR | Status: AC
  Filled 2021-12-22: qty 5

## 2021-12-22 MED ORDER — HYDROCODONE-ACETAMINOPHEN 5-325 MG PO TABS
1.0000 | ORAL_TABLET | ORAL | 0 refills | Status: DC | PRN
  Filled 2021-12-22: qty 30, 5d supply, fill #0

## 2021-12-22 MED ORDER — ONDANSETRON HCL 4 MG/2ML IJ SOLN
INTRAMUSCULAR | Status: DC | PRN
Start: 2021-12-22 — End: 2021-12-22
  Administered 2021-12-22: 4 mg via INTRAVENOUS

## 2021-12-22 MED ORDER — MIDAZOLAM HCL 2 MG/2ML IJ SOLN
INTRAMUSCULAR | Status: AC
  Filled 2021-12-22: qty 2

## 2021-12-22 SURGICAL SUPPLY — 39 items
BAG COUNTER SPONGE SURGICOUNT (BAG) ×3 IMPLANT
BENZOIN TINCTURE PRP APPL 2/3 (GAUZE/BANDAGES/DRESSINGS) ×2 IMPLANT
BLADE SAW SGTL HD 18.5X60.5X1. (BLADE) ×3 IMPLANT
BLADE SURG 21 STRL SS (BLADE) ×3 IMPLANT
BNDG COHESIVE 4X5 TAN STRL (GAUZE/BANDAGES/DRESSINGS) IMPLANT
BNDG COHESIVE 6X5 TAN ST LF (GAUZE/BANDAGES/DRESSINGS) ×1 IMPLANT
BNDG GAUZE ELAST 4 BULKY (GAUZE/BANDAGES/DRESSINGS) IMPLANT
COVER SURGICAL LIGHT HANDLE (MISCELLANEOUS) ×3 IMPLANT
DRAPE DERMATAC (DRAPES) ×2 IMPLANT
DRAPE INCISE IOBAN 66X45 STRL (DRAPES) ×2 IMPLANT
DRAPE U-SHAPE 47X51 STRL (DRAPES) ×3 IMPLANT
DRESSING PEEL AND PLC PRVNA 13 (GAUZE/BANDAGES/DRESSINGS) IMPLANT
DRSG ADAPTIC 3X8 NADH LF (GAUZE/BANDAGES/DRESSINGS) IMPLANT
DRSG PAD ABDOMINAL 8X10 ST (GAUZE/BANDAGES/DRESSINGS) IMPLANT
DRSG PEEL AND PLACE PREVENA 13 (GAUZE/BANDAGES/DRESSINGS) ×3
DURAPREP 26ML APPLICATOR (WOUND CARE) ×3 IMPLANT
ELECT REM PT RETURN 9FT ADLT (ELECTROSURGICAL) ×3
ELECTRODE REM PT RTRN 9FT ADLT (ELECTROSURGICAL) ×2 IMPLANT
GAUZE SPONGE 4X4 12PLY STRL (GAUZE/BANDAGES/DRESSINGS) IMPLANT
GLOVE BIOGEL PI IND STRL 9 (GLOVE) ×2 IMPLANT
GLOVE BIOGEL PI INDICATOR 9 (GLOVE) ×1
GLOVE SURG ORTHO 9.0 STRL STRW (GLOVE) ×3 IMPLANT
GOWN STRL REUS W/ TWL XL LVL3 (GOWN DISPOSABLE) ×6 IMPLANT
GOWN STRL REUS W/TWL XL LVL3 (GOWN DISPOSABLE) ×3
GRAFT SKIN WND SURGIBIND 3X7 (Tissue) ×1 IMPLANT
KIT BASIN OR (CUSTOM PROCEDURE TRAY) ×3 IMPLANT
KIT DRSG PREVENA PLUS 7DAY 125 (MISCELLANEOUS) ×1 IMPLANT
KIT TURNOVER KIT B (KITS) ×3 IMPLANT
NS IRRIG 1000ML POUR BTL (IV SOLUTION) ×3 IMPLANT
PACK ORTHO EXTREMITY (CUSTOM PROCEDURE TRAY) ×3 IMPLANT
PAD ARMBOARD 7.5X6 YLW CONV (MISCELLANEOUS) ×6 IMPLANT
SPONGE T-LAP 18X18 ~~LOC~~+RFID (SPONGE) IMPLANT
STAPLER VISISTAT 35W (STAPLE) ×1 IMPLANT
SUT ETHILON 2 0 PSLX (SUTURE) ×6 IMPLANT
TOWEL GREEN STERILE (TOWEL DISPOSABLE) ×3 IMPLANT
TOWEL GREEN STERILE FF (TOWEL DISPOSABLE) ×3 IMPLANT
TUBE CONNECTING 12X1/4 (SUCTIONS) ×3 IMPLANT
WATER STERILE IRR 1000ML POUR (IV SOLUTION) ×3 IMPLANT
YANKAUER SUCT BULB TIP NO VENT (SUCTIONS) ×3 IMPLANT

## 2021-12-22 NOTE — Progress Notes (Signed)
Orthopedic Tech Progress Note Patient Details:  Joshua Branch [birth year] [id number]  Ortho Devices Type of Ortho Device: Crutches, Postop shoe/boot Ortho Device/Splint Location: LLE Ortho Device/Splint Interventions: Application, Ordered, Adjustment   Post Interventions Patient Tolerated: Well  Ailed Defibaugh A Taegen Delker 12/22/2021, 10:15 AM

## 2021-12-22 NOTE — Anesthesia Postprocedure Evaluation (Signed)
Anesthesia Post Note  [name]  Procedure(s) Performed: LEFT TRANSMETATARSAL AMPUTATION (Left: Foot) APPLICATION OF WOUND VAC     Patient location during evaluation: PACU Anesthesia Type: General Level of consciousness: awake and alert and oriented Pain management: pain level controlled Vital Signs Assessment: post-procedure vital signs reviewed and stable Respiratory status: spontaneous breathing, nonlabored ventilation and respiratory function stable Cardiovascular status: blood pressure returned to baseline and stable Postop Assessment: no apparent nausea or vomiting Anesthetic complications: no   No notable events documented.  Last Vitals:  Vitals:   12/22/21 0900 12/22/21 0913  BP:  115/77  Pulse: 72 67  Resp: 10 15  Temp:    SpO2: 100% 100%    Last Pain:  Vitals:   12/22/21 0858  TempSrc:   PainSc: 0-No pain                 Daemon Dowty A.

## 2021-12-22 NOTE — Transfer of Care (Signed)
Immediate Anesthesia Transfer of Care Note  [name]  Procedure(s) Performed: LEFT TRANSMETATARSAL AMPUTATION (Left: Foot) APPLICATION OF WOUND VAC  Patient Location: PACU  Anesthesia Type:GA combined with regional for post-op pain  Level of Consciousness: awake, alert  and oriented  Airway & Oxygen Therapy: Patient Spontanous Breathing and Patient connected to nasal cannula oxygen  Post-op Assessment: Report given to RN, Post -op Vital signs reviewed and stable, Patient moving all extremities X 4 and Patient able to stick tongue midline  Post vital signs: Reviewed  Last Vitals:  Vitals Value Taken Time  BP 120/81   Temp 97.8   Pulse 74   Resp 19   SpO2 100     Last Pain:  Vitals:   12/22/21 0632  TempSrc:   PainSc: 0-No pain      Patients Stated Pain Goal: 3 (75/10/25 8527)  Complications: No notable events documented.

## 2021-12-22 NOTE — H&P (Signed)
Joshua Branch is an 61 y.o. male.   Chief Complaint: Osteomyelitis ulceration left foot. HPI: Patient is a 61 year old gentleman who is status post third ray amputation left foot.  Patient has had a chronic ulcer beneath the fourth metatarsal head.  Patient has had progressive ulceration and has dead space that now probes down to the second metatarsal as well.  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;  Service: Orthopedics;  Laterality: Right;   AMPUTATION Left 01/11/2021   Procedure: LEFT FOOT 3RD RAY AMPUTATION;  Surgeon: Newt Minion, MD;  Location: [facility];  Service: Orthopedics;  Laterality: Left;   COLONOSCOPY W/ POLYPECTOMY     CORONARY ARTERY BYPASS GRAFT N/A 06/25/2018   Procedure: CORONARY ARTERY BYPASS GRAFTING (CABG) TIMES FOUR USING LEFT INTERNAL MAMMARY ARTERY AND RIGHT ENDOSCOPICALLY HARVESTED SAPHENOUS VEIN;  Surgeon: Rexene Alberts, MD;  Location: [facility];  Service: Open Heart Surgery;  Laterality: N/A;   CYSTOSCOPY WITH STENT PLACEMENT Left 12/04/2021   Procedure: CYSTOSCOPY/RETROGRADE/ STENT PLACEMENT;  Surgeon: Vira Agar, MD;   Location[facility];  Service: Urology;  Laterality: Left;   ENDOVEIN HARVEST OF GREATER SAPHENOUS VEIN Right 06/25/2018   Procedure: ENDOVEIN HARVEST OF GREATER SAPHENOUS VEIN;  Surgeon: Rexene Alberts, MD;  Location: [facility];  Service: Open Heart Surgery;  Laterality: Right;   EXTRACORPOREAL SHOCK WAVE LITHOTRIPSY Left 12/07/2021   Procedure: EXTRACORPOREAL SHOCK WAVE LITHOTRIPSY (ESWL);  Surgeon: Festus Aloe, MD;  Location: [facility];  Service: Urology;  Laterality: Left;   LEFT HEART CATH AND CORONARY ANGIOGRAPHY N/A 06/22/2018   Procedure: LEFT HEART CATH AND CORONARY ANGIOGRAPHY;  Surgeon: Nigel Mormon, MD;  Location: [facility];  Service: Cardiovascular;  Laterality: N/A;   surgical debridement  Right    Right foot ulcer   TEE WITHOUT CARDIOVERSION N/A 06/25/2018   Procedure: TRANSESOPHAGEAL ECHOCARDIOGRAM (TEE);  Surgeon: Rexene Alberts, MD;  Location: [facility];  Service: Open Heart Surgery;  Laterality: N/A;    Family History  Problem Relation Age of Onset   Diabetes Mother    Stroke Father    Hypertension Father    Hearing loss Father    Colon polyps Father    Colon cancer Neg Hx    Esophageal cancer Neg Hx    Prostate cancer Neg Hx    Social History:  reports that he has never smoked. He has never used smokeless tobacco. He reports current alcohol use of about 2.0  standard drinks per week. He reports that he does not use drugs.  Allergies:  Allergies  Allergen Reactions   Apricot Kernel Oil [Prunus] Swelling    THROAT   Cherry Swelling    THROAT   Jardiance [Empagliflozin] Diarrhea and Nausea And Vomiting   Other Other (See Comments)    Fruits with a pit. Throat swells up. Can have them if cooked    Peach [Prunus Persica] Swelling    THROAT   Plum Pulp Swelling    THROAT    Medications Prior to Admission  Medication Sig Dispense Refill   albuterol (VENTOLIN HFA) 108 (90 Base) MCG/ACT inhaler Inhale 2 puffs into the lungs every 6  (six) hours as needed for wheezing or shortness of breath (Cough). 18 g 0   atorvastatin (LIPITOR) 80 MG tablet Take 1 tablet (80 mg total) by mouth daily. 90 tablet 3   benzonatate (TESSALON) 100 MG capsule Take 1 capsule (100 mg total) by mouth 3 (three) times daily as needed for cough. 30 capsule 0   budesonide-formoterol (SYMBICORT) 160-4.5 MCG/ACT inhaler Inhale 2 puffs into the lungs 2 (two) times daily. 10.2 g 1   Coenzyme Q10 (CO Q-10) 300 MG CAPS Take 300 mg by mouth daily.     glipiZIDE (GLUCOTROL XL) 5 MG 24 hr tablet Take 1 tablet (5 mg total) by mouth daily before breakfast. 90 tablet 3   hydrochlorothiazide (MICROZIDE) 12.5 MG capsule TAKE 1 CAPSULE (12.5 MG TOTAL) BY MOUTH DAILY. (Patient taking differently: Take 12.5 mg by mouth daily.) 90 capsule 1   HYDROcodone-acetaminophen (NORCO/VICODIN) 5-325 MG tablet Take 2 tablets by mouth every 6 (six) hours as needed for moderate pain. 12 tablet 0   levocetirizine (XYZAL) 5 MG tablet Take 1 tablet (5 mg total) by mouth every evening. 30 tablet 3   lisinopril (ZESTRIL) 10 MG tablet TAKE 1 TABLET (10 MG TOTAL) BY MOUTH DAILY. (Patient taking differently: Take 10 mg by mouth daily.) 90 tablet 0   metFORMIN (GLUCOPHAGE) 500 MG tablet Take 3 tablets by mouth daily as advised (Patient taking differently: Take 1,500 mg by mouth daily.) 270 tablet 3   metoprolol tartrate (LOPRESSOR) 25 MG tablet TAKE 1 TABLET (25 MG TOTAL) BY MOUTH 2 (TWO) TIMES DAILY. (Patient taking differently: Take 25 mg by mouth 2 (two) times daily.) 180 tablet 3   Multiple Vitamin (MULTIVITAMIN WITH MINERALS) TABS tablet Take 1 tablet by mouth daily.     sulfamethoxazole-trimethoprim (BACTRIM DS) 800-160 MG tablet Take 1 tablet by mouth 2 (two) times daily. 20 tablet 0   tamsulosin (FLOMAX) 0.4 MG CAPS capsule Take 1 capsule by mouth once daily (Patient taking differently: Take 0.4 mg by mouth daily.) 30 capsule 3   Testosterone 30 MG/ACT SOLN Apply 2 pumps on to the skin once  daily (Patient taking differently: Apply 2 pumps onto the skin daily) 90 mL 1   vitamin B-12 (CYANOCOBALAMIN) 1000 MCG tablet Take 1,000 mcg by mouth daily.     Blood Glucose Monitoring Suppl (FREESTYLE LITE) DEVI      COVID-19 mRNA bivalent vaccine, Pfizer, injection Inject into the muscle. 0.3 mL 0   glucose blood (FREESTYLE LITE) test strip USE AS INSTRUCTED TO CHECK 1-2X A DAY 100 strip 1   influenza vac split quadrivalent PF (FLUARIX) 0.5 ML injection Inject into the muscle. 0.5 mL 0   Lancets MISC Check sugars 3 times a day E11.9 100 each 0   nitroGLYCERIN (NITRODUR - DOSED IN MG/24 HR) 0.2 mg/hr patch  Place 1 patch (0.2 mg total) onto the skin daily. 30 patch 12   pentoxifylline (TRENTAL) 400 MG CR tablet Take 1 tablet (400 mg total) by mouth 3 (three) times daily with meals. (Patient not taking: Reported on 12/07/2021) 90 tablet 3   sildenafil (REVATIO) 20 MG tablet TAKE 2-5 TABLETS BY MOUTH ONE HOUR BEFORE SEX AS NEEDED **MAX OF 5 TABLETS WITHIN A 24 HOUR PERIOD** (Patient taking differently: Take 20 mg by mouth daily as needed (for erectile dysfunction).) 50 tablet 1   tadalafil (CIALIS) 10 MG tablet Take 1 tablet (10 mg total) by mouth every other day as needed for erectile dysfunction. 10 tablet 1    Results for orders placed or performed during the hospital encounter of 12/22/21 (from the past 48 hour(s))  Glucose, capillary     Status: Abnormal   Collection Time: 12/22/21  5:52 AM  Result Value Ref Range   Glucose-Capillary 123 (H) 70 - 99 mg/dL    Comment: Glucose reference range applies only to samples taken after fasting for at least 8 hours.   No results found.  Review of Systems  All other systems reviewed and are negative.  Blood pressure 120/81, pulse 79, temperature 97.7 F (36.5 C), temperature source Oral, resp. rate 17, height 6' 2.5" (1.892 m), weight 106.6 kg, SpO2 98 %. Physical Exam  Patient is alert, oriented, no adenopathy, well-dressed, normal affect, normal  respiratory effort. Examination patient has increasing cellulitis through the midfoot his foot is warm to the touch.  The ulcer probes 4 cm to the second metatarsal head as well as probing to the fourth metatarsal head.  Patient has increased dead space throughout the forefoot and increased infection from his last examination. Assessment/Plan Assessment: Osteomyelitis ulceration abscess left forefoot.  Plan: We will plan for a left transmetatarsal amputation.  Discussed the risk of the infection being more proximal need for additional surgery difficulty with wound healing risk of persistent infection.  Patient states he understands wished to proceed at this time.  Newt Minion, MD 12/22/2021, 6:33 AM

## 2021-12-22 NOTE — Op Note (Signed)
12/22/2021  8:32 AM  PATIENT:  Joshua Branch    PRE-OPERATIVE DIAGNOSIS:  Osteomyelitis Abscess Left Foot  POST-OPERATIVE DIAGNOSIS:  Same  PROCEDURE:  LEFT TRANSMETATARSAL AMPUTATION, APPLICATION OF WOUND VAC Application of Kerecis tissue graft 3 x 7 cm.  SURGEON:  Newt Minion, MD  PHYSICIAN ASSISTANT:None ANESTHESIA:   General  PREOPERATIVE INDICATIONS:  [name] is a  61 y.o. male with a diagnosis of Osteomyelitis Abscess Left Foot who failed conservative measures and elected for surgical management.    The risks benefits and alternatives were discussed with the patient preoperatively including but not limited to the risks of infection, bleeding, nerve injury, cardiopulmonary complications, the need for revision surgery, among others, and the patient was willing to proceed.  OPERATIVE IMPLANTS: Kerecis tissue graft 3 x 7 cm.  '@ENCIMAGES'$ @  OPERATIVE FINDINGS: Tissue margins were clear no signs of abscess at the level of amputation.  The abscess tissue was sent for cultures.  OPERATIVE PROCEDURE: Patient was brought to the operating room after undergoing a regional anesthetic.  Patient underwent a general anesthetic.  After adequate levels anesthesia were obtained patient's left lower extremity was prepped using DuraPrep draped into a sterile field a timeout was called.  A fishmouth incision was made just proximal to the ulcerative necrotic tissue.  This was carried sharply down to bone.  Oscillating saw was used to perform a transmetatarsal amputation.  Electrocautery was used hemostasis.  The wound was irrigated with normal saline the tissue margins were clear.  The 3 x 7 cm Kerecis tissue graft was applied over the metatarsal heads and secured in place with staples.  The tissue was closed over the graft closed with 2-0 nylon.  This was covered with a 13 cm Prevena wound VAC secured with derma tack this had a good suction fit patient was overwrapped with Coban extubated  taken the PACU in stable condition.   DISCHARGE PLANNING:  Antibiotic duration: Preoperative antibiotics  Weightbearing: Nonweightbearing on the left  Pain medication: Prescription for Vicodin  Dressing care/ Wound VAC: Wound VAC continue for 1 week  Ambulatory devices: Crutches  Discharge to: Home.  Follow-up: In the office 1 week post operative.

## 2021-12-22 NOTE — Anesthesia Procedure Notes (Signed)
Anesthesia Regional Block: Popliteal block   Pre-Anesthetic Checklist: , timeout performed,  Correct Patient, Correct Site, Correct Laterality,  Correct Procedure, Correct Position, site marked,  Risks and benefits discussed,  Surgical consent,  Pre-op evaluation,  At surgeon's request and post-op pain management  Laterality: Left  Prep: chloraprep       Needles:  Injection technique: Single-shot  Needle Type: Echogenic Stimulator Needle     Needle Length: 10cm  Needle Gauge: 21   Needle insertion depth: 7 cm   Additional Needles:   Procedures:,,,, ultrasound used (permanent image in chart),,    Narrative:  Start time: 12/22/2021 7:25 AM End time: 12/22/2021 7:30 AM Injection made incrementally with aspirations every 5 mL.  Performed by: Personally  Anesthesiologist: Josephine Igo, MD  Additional Notes: Timeout performed. Patient sedated. Relevant anatomy ID'd using [place]. Incremental 2-90m injection of LA with frequent aspiration. Patient tolerated procedure well.    Left Popliteal Block

## 2021-12-22 NOTE — Anesthesia Procedure Notes (Signed)
Procedure Name: LMA Insertion Date/Time: 12/22/2021 7:48 AM Performed by: Maude Leriche, CRNA Pre-anesthesia Checklist: Patient identified, Emergency Drugs available, Suction available, Patient being monitored and Timeout performed Patient Re-evaluated:Patient Re-evaluated prior to induction Oxygen Delivery Method: Circle system utilized Preoxygenation: Pre-oxygenation with 100% oxygen Induction Type: IV induction Ventilation: Mask ventilation without difficulty LMA: LMA inserted LMA Size: 5.0 Number of attempts: 2 Tube secured with: Tape Dental Injury: Teeth and Oropharynx as per pre-operative assessment

## 2021-12-26 ENCOUNTER — Encounter (HOSPITAL_COMMUNITY): Payer: Self-pay | Admitting: Orthopedic Surgery

## 2021-12-27 ENCOUNTER — Other Ambulatory Visit: Payer: Self-pay | Admitting: Orthopedic Surgery

## 2021-12-27 ENCOUNTER — Other Ambulatory Visit: Payer: Self-pay | Admitting: Medical

## 2021-12-27 ENCOUNTER — Other Ambulatory Visit (HOSPITAL_BASED_OUTPATIENT_CLINIC_OR_DEPARTMENT_OTHER): Payer: Self-pay

## 2021-12-27 LAB — AEROBIC/ANAEROBIC CULTURE W GRAM STAIN (SURGICAL/DEEP WOUND)

## 2021-12-27 MED ORDER — LISINOPRIL 10 MG PO TABS
ORAL_TABLET | Freq: Every day | ORAL | 0 refills | Status: DC
  Filled 2021-12-27: qty 90, 90d supply, fill #0

## 2021-12-27 MED ORDER — SULFAMETHOXAZOLE-TRIMETHOPRIM 800-160 MG PO TABS
1.0000 | ORAL_TABLET | Freq: Two times a day (BID) | ORAL | 0 refills | Status: DC
Start: 2021-12-27 — End: 2022-01-26
  Filled 2021-12-27: qty 40, 20d supply, fill #0

## 2021-12-28 ENCOUNTER — Other Ambulatory Visit (HOSPITAL_BASED_OUTPATIENT_CLINIC_OR_DEPARTMENT_OTHER): Payer: Self-pay

## 2021-12-28 ENCOUNTER — Encounter (HOSPITAL_COMMUNITY): Payer: Self-pay | Admitting: Orthopedic Surgery

## 2021-12-28 ENCOUNTER — Ambulatory Visit (INDEPENDENT_AMBULATORY_CARE_PROVIDER_SITE_OTHER): Payer: 59 | Admitting: Orthopedic Surgery

## 2021-12-28 DIAGNOSIS — Z89432 Acquired absence of left foot: Secondary | ICD-10-CM

## 2021-12-28 MED ORDER — NITROGLYCERIN 0.2 MG/HR TD PT24
0.2000 mg | MEDICATED_PATCH | Freq: Every day | TRANSDERMAL | 12 refills | Status: DC
  Filled 2021-12-28: qty 30, 30d supply, fill #0

## 2021-12-28 MED ORDER — PENTOXIFYLLINE ER 400 MG PO TBCR
400.0000 mg | EXTENDED_RELEASE_TABLET | Freq: Three times a day (TID) | ORAL | 3 refills | Status: DC
  Filled 2021-12-28: qty 90, 30d supply, fill #0

## 2021-12-29 ENCOUNTER — Encounter: Payer: Self-pay | Admitting: Orthopedic Surgery

## 2021-12-29 ENCOUNTER — Telehealth: Payer: Self-pay | Admitting: Cardiovascular Disease

## 2021-12-29 ENCOUNTER — Other Ambulatory Visit (HOSPITAL_BASED_OUTPATIENT_CLINIC_OR_DEPARTMENT_OTHER): Payer: Self-pay

## 2021-12-29 NOTE — Progress Notes (Signed)
Office Visit Note   [name]           Date of Birth: May 24, 1961           MRN: [id number] Visit Date: 12/28/2021              Requested by: Mackie Pai, PA-C [place],  [place] [id number] PCP: Mackie Pai, PA-C  Chief Complaint  Patient presents with   Left Foot - Routine Post Op    12/22/21 left transmet amputation w/ kerecis graft      HPI: Patient is a 61 year old gentleman who is status post a left transmetatarsal amputation.  Assessment & Plan: Visit Diagnoses:  1. History of transmetatarsal amputation of left foot ([name]     Plan: There are mild ischemic changes we will start compression nitroglycerin trental  Follow-Up Instructions: Return in about 2 weeks (around 01/11/2022).   Ortho Exam  Patient is alert, oriented, no adenopathy, well-dressed, normal affect, normal respiratory effort. Examination the dorsal flap has some mild ischemic changes the wound edges are well approximated there is some mild maceration no cellulitis.  Imaging: No results found.   Labs: Lab Results  Component Value Date   HGBA1C 7.3 (A) 06/14/2021   HGBA1C 7.4 (A) 01/27/2021   HGBA1C 6.9 (H) 04/15/2020   REPTSTATUS 12/27/2021 FINAL 12/22/2021   GRAMSTAIN  12/22/2021    RARE WBC PRESENT,BOTH PMN AND MONONUCLEAR NO ORGANISMS SEEN    CULT  12/22/2021    RARE METHICILLIN RESISTANT STAPHYLOCOCCUS AUREUS NO ANAEROBES ISOLATED Performed at [facility], [place] [address] [place], [place] [address]    LABORGA METHICILLIN RESISTANT STAPHYLOCOCCUS AUREUS 12/22/2021     Lab Results  Component Value Date   ALBUMIN 4.3 05/01/2021   ALBUMIN 3.9 05/08/2019   ALBUMIN 2.7 (L) 06/23/2018    Lab Results  Component Value Date   MG 2.1 05/08/2019   MG 2.2 06/26/2018   MG 3.0 (H) 06/26/2018   No results found for: VD25OH  No results found for: PREALBUMIN    Latest Ref Rng & Units 12/22/2021    5:56 AM 12/01/2021    3:48 PM  11/21/2021   12:14 PM  CBC EXTENDED  WBC 4.0 - 10.5 K/uL 7.7   10.6   11.1    RBC 4.22 - 5.81 MIL/uL 3.76   4.27   4.49    Hemoglobin 13.0 - 17.0 g/dL 11.9   13.6   14.0    HCT 39.0 - 52.0 % 34.8   38.5   42.1    Platelets 150 - 400 K/uL 256   405   251.0    NEUT# 1,500 - 7,800 cells/uL  7,780   9.0    Lymph# 850 - 3,900 cells/uL  1,866   1.1       There is no height or weight on file to calculate BMI.  Orders:  No orders of the defined types were placed in this encounter.  Meds ordered this encounter  Medications   nitroGLYCERIN (NITRODUR - DOSED IN MG/24 HR) 0.2 mg/hr patch    Sig: Place 1 patch (0.2 mg total) onto the skin daily.    Dispense:  30 patch    Refill:  12   pentoxifylline (TRENTAL) 400 MG CR tablet    Sig: Take 1 tablet (400 mg total) by mouth 3 (three) times daily with meals.    Dispense:  90 tablet    Refill:  3  Procedures: No procedures performed  Clinical Data: No additional findings.  ROS:  All other systems negative, except as noted in the HPI. Review of Systems  Objective: Vital Signs: There were no vitals taken for this visit.  Specialty Comments:  No specialty comments available.  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   Acute coronary syndrome ([place]) 06/22/2018   Nonhealing skin ulcer ([place]) 06/22/2018   Coronary artery disease involving native coronary artery with unstable angina pectoris ([place]) 06/22/2018   Acute myocardial infarction ([place]) 06/22/2018   Hyperlipidemia    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   Vertigo  08/20/2016   Myringotomy tube status 08/20/2016   Ear fullness, left 08/20/2016   Asymmetrical left sensorineural hearing loss 02/06/2016   Neuropathy of left foot 02/03/2015   Charcot's joint of left foot, non-diabetic 02/03/2015   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   Cramping of hands 06/02/2014   Eustachian tube dysfunction 06/02/2014   HTN (hypertension) 05/07/2014   Obstructive sleep apnea 05/07/2014   Past Medical History:  Diagnosis Date   Acute myocardial infarction ([place]) 06/22/2018   Allergy    Basal cell carcinoma    Charcot's joint of left foot, non-diabetic    Coronary artery disease involving native coronary artery with unstable angina pectoris ([place]) 06/22/2018   Diabetes ([place])    type 2   GERD (gastroesophageal reflux disease)    History of kidney stones    passed   Hyperlipidemia    Hypertension    Neuropathy of left foot    Pneumonia    As a child   Right foot ulcer ([place])    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   LIMA to LAD, SVG to Diag, SVG to OM2, SVG to [name], EVH via right thigh and leg   Type II diabetes mellitus with complication, uncontrolled    Uncontrolled type 2 diabetes mellitus     Family History  Problem Relation Age of Onset   Diabetes Mother    Stroke Father    Hypertension Father    Hearing loss Father    Colon polyps Father    Colon cancer Neg Hx    Esophageal cancer Neg Hx    Prostate cancer Neg Hx     Past Surgical History:  Procedure Laterality Date   AMPUTATION Right 01/06/2020   Procedure: RIGHT FOOT 2ND RAY AMPUTATION;  Surgeon: Newt Minion, MD;  Location: [facility];  Service: Orthopedics;  Laterality: Right;   AMPUTATION Left 01/11/2021   Procedure: LEFT FOOT 3RD RAY AMPUTATION;  Surgeon: Newt Minion, MD;  Location: [facility];  Service: Orthopedics;  Laterality: Left;   AMPUTATION Left 12/22/2021   Procedure: LEFT TRANSMETATARSAL AMPUTATION;  Surgeon: Newt Minion, MD;  Location: [facility];  Service: Orthopedics;  Laterality: Left;    APPLICATION OF WOUND VAC  12/22/2021   Procedure: APPLICATION OF WOUND VAC;  Surgeon: Newt Minion, MD;  Location: [facility];  Service: Orthopedics;;   COLONOSCOPY W/ POLYPECTOMY     CORONARY ARTERY BYPASS GRAFT N/A 06/25/2018  Procedure: CORONARY ARTERY BYPASS GRAFTING (CABG) TIMES FOUR USING LEFT INTERNAL MAMMARY ARTERY AND RIGHT ENDOSCOPICALLY HARVESTED SAPHENOUS VEIN;  Surgeon: Rexene Alberts, MD;  Location: [facility];  Service: Open Heart Surgery;  Laterality: N/A;   CYSTOSCOPY WITH STENT PLACEMENT Left 12/04/2021   Procedure: CYSTOSCOPY/RETROGRADE/ STENT PLACEMENT;  Surgeon: Vira Agar, MD;  Location[facility];  Service: Urology;  Laterality: Left;   ENDOVEIN HARVEST OF GREATER SAPHENOUS VEIN Right 06/25/2018   Procedure: ENDOVEIN HARVEST OF GREATER SAPHENOUS VEIN;  Surgeon: Rexene Alberts, MD;  Location: [facility];  Service: Open Heart Surgery;  Laterality: Right;   EXTRACORPOREAL SHOCK WAVE LITHOTRIPSY Left 12/07/2021   Procedure: EXTRACORPOREAL SHOCK WAVE LITHOTRIPSY (ESWL);  Surgeon: Festus Aloe, MD;  Location[facility];  Service: Urology;  Laterality: Left;   LEFT HEART CATH AND CORONARY ANGIOGRAPHY N/A 06/22/2018   Procedure: LEFT HEART CATH AND CORONARY ANGIOGRAPHY;  Surgeon: Nigel Mormon, MD;  Location: [facility];  Service: Cardiovascular;  Laterality: N/A;   surgical debridement  Right    Right foot ulcer   TEE WITHOUT CARDIOVERSION N/A 06/25/2018   Procedure: TRANSESOPHAGEAL ECHOCARDIOGRAM (TEE);  Surgeon: Rexene Alberts, MD;  Location: [facility];  Service: Open Heart Surgery;  Laterality: N/A;   Social History   Occupational History   Occupation: attorney  Tobacco Use   Smoking status: Never   Smokeless tobacco: Never  Vaping Use   Vaping Use: Never used  Substance and Sexual Activity   Alcohol use: Yes    Alcohol/week: 2.0 standard drinks    Types: 2 Cans of beer per week    Comment: 2 cans of beer or 2 borbon   Drug use: Never    Sexual activity: Not on file

## 2021-12-29 NOTE — Telephone Encounter (Signed)
    Patient Name: Joshua Branch  DOB: 26-Mar-1961 MRN: [id number]  Primary Cardiologist: None  Chart reviewed as part of pre-operative protocol coverage. Given past medical history and time since last visit, based on ACC/AHA guidelines, Joshua Branch would be at acceptable risk for the planned procedure without further cardiovascular testing.   The patient was advised that if he develops new symptoms prior to surgery to contact our office to arrange for a follow-up visit, and he verbalized understanding.  Regarding hold for pentoxifylline this should be addressed with PCP or prescribing MD.  I will route this recommendation to the requesting party via [facility] fax function and remove from pre-op pool.  Please call with questions.  Mable Fill, Marissa Nestle, NP 12/29/2021, 12:30 PM

## 2021-12-29 NOTE — Telephone Encounter (Signed)
   Pre-operative Risk Assessment    Patient Name: Joshua Branch  DOB: [birth year] MRN: [id number]      Request for Surgical Clearance    Procedure:   Ureteroscopy for kidney stone   Date of Surgery:  Clearance 01/05/22                                 Surgeon:  Dr. Gloriann Loan Surgeon's Group or Practice Name:  [facility] Phone number:  [phone] Fax number:  [phone]   Type of Clearance Requested:   - Medical  - Pharmacy:  Hold Pentoxifylline   TBD   Type of Anesthesia:  General    Additional requests/questions:      SignedMilbert Coulter   12/29/2021, 11:43 AM

## 2022-01-02 ENCOUNTER — Other Ambulatory Visit: Payer: Self-pay | Admitting: Urology

## 2022-01-03 NOTE — Progress Notes (Addendum)
COVID Vaccine Completed: Yes  Date of COVID positive in last 90 days:  No  PCP - Mackie Pai, PA-C Cardiologist - Quay Burow, MD  Cardiac clearance in Epic dated 12-30-51 Ambrose Pancoast, NP  Chest x-ray - 11-21-21 Epic EKG - 12-06-21 Epic Stress Test - N/A ECHO - greater than 2 years Epic Cardiac Cath - greater than 2 years Epic Pacemaker/ICD device last checked: Spinal Cord Stimulator:  Bowel Prep - N/A  Sleep Study -  Yes, neg sleep apnea CPAP - No  Fasting Blood Sugar - 86 Checks Blood Sugar - one time a day  Blood Thinner Instructions:  Trental, took 01-04-22. Aspirin Instructions: Last Dose:  Activity level:   Can go up a flight of stairs and perform activities of daily living without stopping and without symptoms of chest pain or shortness of breath.  Current limitations due to foot surgery, prior to that able to exercise without any difficulty  Anesthesia review:  CAD, acute coronary syndrome, Hx of MI, HTN, hx of CABG  Patient denies shortness of breath, fever, cough and chest pain at PAT appointment  Patient verbalized understanding of instructions that were given to them at the PAT appointment. Patient was also instructed that they will need to review over the PAT instructions again at home before surgery.

## 2022-01-03 NOTE — Patient Instructions (Addendum)
DUE TO SPACE LIMITATIONS, ONLY TWO VISITORS  (aged 59 and older) ARE ALLOWED TO COME WITH YOU AND STAY IN THE WAITING ROOM DURING YOUR PRE OP AND PROCEDURE.   **NO VISITORS ARE ALLOWED IN THE SHORT STAY AREA OR RECOVERY ROOM!!**   You are not required to quarantine at this time prior to your surgery. However, you must do this: Hand Hygiene often Do NOT share personal items Notify your provider if you are in close contact with someone who has COVID or you develop fever 100.4 or greater, new onset of sneezing, cough, sore throat, shortness of breath or body aches.  Your procedure is scheduled on:  01-05-22   Report to [facility] Main Entrance    Report to admitting at 12:15 PM   Call this number if you have problems the morning of surgery [phone]   Do not eat food :After Midnight the night before surgery   After Midnight you may have the following liquids until 11:30 AM DAY OF SURGERY  Clear Liquid Diet Water Black Coffee (sugar ok, NO MILK/CREAM OR CREAMERS)  Tea (sugar ok, NO MILK/CREAM OR CREAMERS) regular and decaf                             Plain Jell-O (NO RED)                                           Fruit ices (not with fruit pulp, NO RED)                                     Popsicles (NO RED)                                                                  Juice: apple, WHITE grape, WHITE cranberry Sports drinks like Gatorade (NO RED) Clear broth(vegetable,chicken,beef)  FOLLOW  ANY ADDITIONAL PRE OP INSTRUCTIONS YOU RECEIVED FROM YOUR SURGEON'S OFFICE!!!     Oral Hygiene is also important to reduce your risk of infection.                                    Remember - BRUSH YOUR TEETH THE MORNING OF SURGERY WITH YOUR REGULAR TOOTHPASTE   Do NOT smoke after Midnight   Take these medicines the morning of surgery with A SIP OF WATER:  Metoprolol, Bactrim DS, Okay to use Nitroglycerin patch   DO NOT [place]. PHARMACY  WILL DISPENSE MEDICATIONS LISTED ON YOUR MEDICATION LIST TO YOU DURING YOUR ADMISSION [place]!  How to Manage Your Diabetes Before and After Surgery  Why is it important to control my blood sugar before and after surgery? Improving blood sugar levels before and after surgery helps healing and can limit problems. A way of improving blood sugar control is eating a healthy diet by:  Eating less sugar and carbohydrates  Increasing activity/exercise  Talking with your doctor about  reaching your blood sugar goals High blood sugars (greater than 180 mg/dL) can raise your risk of infections and slow your recovery, so you will need to focus on controlling your diabetes during the weeks before surgery. Make sure that the doctor who takes care of your diabetes knows about your planned surgery including the date and location.  How do I manage my blood sugar before surgery? Check your blood sugar at least 4 times a day, starting 2 days before surgery, to make sure that the level is not too high or low. Check your blood sugar the morning of your surgery when you wake up and every 2 hours until you get to the Short Stay unit. If your blood sugar is less than 70 mg/dL, you will need to treat for low blood sugar: Do not take insulin. Treat a low blood sugar (less than 70 mg/dL) with  cup of clear juice (cranberry or apple), 4 glucose tablets, OR glucose gel. Recheck blood sugar in 15 minutes after treatment (to make sure it is greater than 70 mg/dL). If your blood sugar is not greater than 70 mg/dL on recheck, call [phone] for further instructions. Report your blood sugar to the short stay nurse when you get to Short Stay.  If you are admitted to the hospital after surgery: Your blood sugar will be checked by the staff and you will probably be given insulin after surgery (instead of oral diabetes medicines) to make sure you have good blood sugar levels. The goal for blood sugar control after  surgery is 80-180 mg/dL.   WHAT DO I DO ABOUT MY DIABETES MEDICATION?  Do not take oral diabetes medicines (pills) the morning of surgery.  THE DAY BEFORE SURGERY:  Take Glipizide as prescribed   Take Metformin as prescribed      THE MORNING OF SURGERY:  Do not take Glipizide or Metformin.  Reviewed and Endorsed by [facility] Patient Education Committee, August 2015                               You may not have any metal on your body including jewelry, and body piercing             Do not wear  lotions, powders, cologne, or deodorant              Men may shave face and neck.   Contacts, Hearing Aids, dentures or bridgework may not be worn into surgery.   [place] IS NOT RESPONSIBLE  FOR VALUABLES THAT ARE LOST OR STOLEN.    Patients discharged on the day of surgery will not be allowed to drive home.  Someone NEEDS to stay with you for the first 24 hours after anesthesia.  Special Instructions: Bring a copy of your healthcare power of attorney and living will documents the day of surgery, if you have not done so before  Please read over the following fact sheets you were given: IF YOU HAVE QUESTIONS ABOUT YOUR PRE-OP INSTRUCTIONS, PLEASE CALL [facility] - Preparing for Surgery Before surgery, you can play an important role.  Because skin is not sterile, your skin needs to be as free of germs as possible.  You can reduce the number of germs on your skin by washing with CHG (chlorahexidine gluconate) soap before surgery.  CHG is an antiseptic cleaner which kills germs and bonds with the skin to continue killing germs even  after washing. Please DO NOT use if you have an allergy to CHG or antibacterial soaps.  If your skin becomes reddened/irritated stop using the CHG and inform your nurse when you arrive at Short Stay. Do not shave (including legs and underarms) for at least 48 hours prior to the first CHG shower.  You may shave your face/neck.  Please follow  these instructions carefully:  1.  Shower with CHG Soap the night before surgery and the  morning of surgery.  2.  If you choose to wash your hair, wash your hair first as usual with your normal  shampoo.  3.  After you shampoo, rinse your hair and body thoroughly to remove the shampoo.                             4.  Use CHG as you would any other liquid soap.  You can apply chg directly to the skin and wash.  Gently with a scrungie or clean washcloth.  5.  Apply the CHG Soap to your body ONLY FROM THE NECK DOWN.   Do not use on face/ open                           Wound or open sores. Avoid contact with eyes, ears mouth and genitals (private parts).                       Wash face,  Genitals (private parts) with your normal soap.             6.  Wash thoroughly, paying special attention to the area where your  surgery  will be performed.  7.  Thoroughly rinse your body with warm water from the neck down.  8.  DO NOT shower/wash with your normal soap after using and rinsing off the CHG Soap.            9.  Pat yourself dry with a clean towel.            10.  Wear clean pajamas.            11.  Place clean sheets on your bed the night of your first shower and do not  sleep with pets.  ON THE DAY OF SURGERY : Do not apply any lotions/deodorants the morning of surgery.  Please wear clean clothes to the hospital/surgery center.    FAILURE TO FOLLOW THESE INSTRUCTIONS MAY RESULT IN THE CANCELLATION OF YOUR SURGERY  PATIENT SIGNATURE_________________________________  NURSE SIGNATURE__________________________________  ________________________________________________________________________

## 2022-01-04 ENCOUNTER — Encounter (HOSPITAL_COMMUNITY): Payer: Self-pay

## 2022-01-04 ENCOUNTER — Encounter (HOSPITAL_COMMUNITY)
Admission: RE | Admit: 2022-01-04 | Discharge: 2022-01-04 | Disposition: A | Discharge status: Home / Self Care | Payer: 59 | Source: Ambulatory Visit | Attending: Urology | Admitting: Urology

## 2022-01-04 ENCOUNTER — Ambulatory Visit (INDEPENDENT_AMBULATORY_CARE_PROVIDER_SITE_OTHER): Payer: 59 | Admitting: Orthopedic Surgery

## 2022-01-04 ENCOUNTER — Other Ambulatory Visit: Payer: Self-pay

## 2022-01-04 VITALS — BP 120/74 | HR 97 | Temp 97.7°F | Resp 20 | Ht 74.5 in

## 2022-01-04 DIAGNOSIS — Q250 Patent ductus arteriosus: Secondary | ICD-10-CM | POA: Insufficient documentation

## 2022-01-04 DIAGNOSIS — Z951 Presence of aortocoronary bypass graft: Secondary | ICD-10-CM | POA: Insufficient documentation

## 2022-01-04 DIAGNOSIS — E119 Type 2 diabetes mellitus without complications: Secondary | ICD-10-CM | POA: Diagnosis not present

## 2022-01-04 DIAGNOSIS — Z01818 Encounter for other preprocedural examination: Secondary | ICD-10-CM

## 2022-01-04 DIAGNOSIS — I2510 Atherosclerotic heart disease of native coronary artery without angina pectoris: Secondary | ICD-10-CM | POA: Insufficient documentation

## 2022-01-04 DIAGNOSIS — N201 Calculus of ureter: Secondary | ICD-10-CM | POA: Insufficient documentation

## 2022-01-04 DIAGNOSIS — Z89432 Acquired absence of left foot: Secondary | ICD-10-CM

## 2022-01-04 DIAGNOSIS — I10 Essential (primary) hypertension: Secondary | ICD-10-CM | POA: Diagnosis not present

## 2022-01-04 DIAGNOSIS — G4733 Obstructive sleep apnea (adult) (pediatric): Secondary | ICD-10-CM | POA: Diagnosis not present

## 2022-01-04 LAB — BASIC METABOLIC PANEL
Anion gap: 9 (ref 5–15)
BUN: 33 mg/dL — ABNORMAL HIGH (ref 6–20)
CO2: 24 mmol/L (ref 22–32)
Calcium: 9.3 mg/dL (ref 8.9–10.3)
Chloride: 105 mmol/L (ref 98–111)
Creatinine, Ser: 1.53 mg/dL — ABNORMAL HIGH (ref 0.61–1.24)
GFR, Estimated: 52 mL/min — ABNORMAL LOW (ref >60)
Glucose, Bld: 129 mg/dL — ABNORMAL HIGH (ref 70–99)
Potassium: 4.7 mmol/L (ref 3.5–5.1)
Sodium: 138 mmol/L (ref 135–145)

## 2022-01-04 LAB — GLUCOSE, CAPILLARY: Glucose-Capillary: 126 mg/dL — ABNORMAL HIGH (ref 70–99)

## 2022-01-04 LAB — HEMOGLOBIN A1C
Hgb A1c MFr Bld: 6.2 % — ABNORMAL HIGH (ref 4.8–5.6)
Mean Plasma Glucose: 131.24 mg/dL

## 2022-01-04 NOTE — Progress Notes (Signed)
Anesthesia Chart Review:   Case: [id number] Date/Time: 01/05/22 1415   Procedure: CYSTOSCOPY LEFT RETROGRADE PYELOGRAM URETEROSCOPY/HOLMIUM LASER/STENT PLACEMENT (Left) - 1 HR FOR CASE   Anesthesia type: General   Pre-op diagnosis: LEFT URETERAL STONE   Location: [place] [facility]   Surgeons: Lucas Mallow, MD       DISCUSSION: Pt is 61 years old with hx CAD (s/p CABG x4 2019: LIMA-LAD, SVG-[name], SVG-OM2, SVG-DIAG), HTN, OSA, DM  Pt has not stopped pentoxifylline - I notified Pam in Dr. Purvis Sheffield office in case this is a concern.   VS: BP 120/74   Pulse 97   Temp 36.5 C (Oral)   Resp 20   Ht 6' 2.5" (1.892 m)   SpO2 98%   BMI 29.77 kg/m   PROVIDERS: - PCP is Saguier, Iris Pert - Cardiologist is Quay Burow, MD. Last office visit 12/05/21. Cleared for surgery 12/29/21 by Rebekah Chesterfield, NP.   LABS: Labs reviewed: Acceptable for surgery. - CBC 12/22/21: H/H 11.9/34.8 - HbA1c result pending.   (all labs ordered are listed, but only abnormal results are displayed)  Labs Reviewed  BASIC METABOLIC PANEL - Abnormal; Notable for the following components:      Result Value   Glucose, Bld 129 (*)    BUN 33 (*)    Creatinine, Ser 1.53 (*)    GFR, Estimated 52 (*)    All other components within normal limits  GLUCOSE, CAPILLARY - Abnormal; Notable for the following components:   Glucose-Capillary 126 (*)    All other components within normal limits  HEMOGLOBIN A1C     IMAGES: CXR 11/21/21:  - No active cardiopulmonary abnormalities.  EKG 12/06/21: NSR. LAD. Possible lateral infarct, age undetermined.    CV: none since CABG  TEE (inta-op CABG) 06/25/18:  Septum: No Patent Foramen Ovale present.   Left atrium: Patent foramen ovale not present.   Aortic valve: The valve is trileaflet. Mild valve calcification present. No stenosis. Trace regurgitation. No AV vegetation.   Mitral valve: No leaflet thickening and calcification present. Trace regurgitation.    Right ventricle: Normal cavity size, wall thickness and ejection fraction. No thrombus present. No mass present.  Left ventricle: Normal cavity size, wall thickness. LV systolic function is low normal with an EF 50-55%. Wall motion is abnormal. Inferolateral wall motion in hypokinetic. No thrombus present. No mass present.     Echo 06/22/18: Study Conclusions  - Left ventricle: The cavity size was normal. Wall thickness was    normal. Systolic function was normal. The estimated ejection    fraction was in the range of 50% to 55%. Mild hypokinesis of the    apical myocardium. No thrombus seen on contrast study. Left    ventricular diastolic function parameters were normal.  - Aortic valve: Mildly calcified annulus. Mildly thickened    leaflets. No significant stenosis or regurgitation.  - Mitral valve: Mildly calcified annulus. No significant stenosis    or regurgitation.      Cardiac cath 06/22/18 (PRE-CABG: Ost 1st Diag to 1st Diag lesion is 70% stenosed. 1st Diag lesion is 80% stenosed. LM: Normal LAD: Prox ectatic areas with 40% stenoses          Mid to distal LAD is likely the culprit vessel. Mid 100% occlusion, with          recanalization.Distal apical LAD is completely occluded and receives faint collaterals from septal perforators, and distal RPDA. TIMI 1 flow in distal LAD, TIMI  0 flow in distal apical LAD          Medium sized Diag branch with mid 70% and distal 80% stenoses.  LCx: Large OM 1 with proxiaml 50% and mid to distal 80% stenosis         Medium sized OM2 completely occluded with collaterals from OM1.  RCA: Dominant. Prox 40%, mid 80%, and ostial RPDA 95% stenoses. RPDA gives faint collateral to apical distal  LVEF 50-55% with apical hypokinesis.  Recommendation: CT surgery consult (S/p CABG 06/25/18)     Carotid US 06/23/18: Summary:  - Right Carotid: Velocities in the right ICA are consistent with a 1-39%  stenosis.  - Left Carotid: Velocities in the left  ICA are consistent with a 1-39%  stenosis.  - Vertebrals: Bilateral vertebral arteries demonstrate antegrade flow.   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   LIMA to LAD, SVG to Diag, SVG to OM2, SVG to [name], EVH via right thigh and leg   Type II diabetes mellitus with complication, uncontrolled    Uncontrolled type 2 diabetes mellitus     Past Surgical History:  Procedure Laterality Date   AMPUTATION Right 01/06/2020   Procedure: RIGHT FOOT 2ND RAY AMPUTATION;  Surgeon: Newt Minion, MD;  Location: [place];  Service: Orthopedics;  Laterality: Right;   AMPUTATION Left 01/11/2021   Procedure: LEFT FOOT 3RD RAY AMPUTATION;  Surgeon: Newt Minion, MD;  Location: [place];  Service: Orthopedics;  Laterality: Left;   AMPUTATION Left 12/22/2021   Procedure: LEFT TRANSMETATARSAL AMPUTATION;  Surgeon: Newt Minion, MD;  Location: [place];  Service: Orthopedics;  Laterality: Left;   APPLICATION OF WOUND VAC  12/22/2021   Procedure: APPLICATION OF WOUND VAC;  Surgeon: Newt Minion, MD;  Location: [place];  Service: Orthopedics;;   COLONOSCOPY W/ POLYPECTOMY     CORONARY ARTERY BYPASS GRAFT N/A 06/25/2018   Procedure: CORONARY ARTERY BYPASS GRAFTING (CABG) TIMES FOUR USING LEFT INTERNAL MAMMARY ARTERY AND RIGHT ENDOSCOPICALLY HARVESTED SAPHENOUS VEIN;  Surgeon: Rexene Alberts, MD;  Location: [place];  Service: Open Heart Surgery;  Laterality: N/A;   CYSTOSCOPY WITH STENT PLACEMENT Left 12/04/2021   Procedure:  CYSTOSCOPY/RETROGRADE/ STENT PLACEMENT;  Surgeon: Vira Agar, MD;  [facility];  Service: Urology;  Laterality: Left;   ENDOVEIN HARVEST OF GREATER SAPHENOUS VEIN Right 06/25/2018   Procedure: ENDOVEIN HARVEST OF GREATER SAPHENOUS VEIN;  Surgeon: Rexene Alberts, MD;  Location: [place];  Service: Open Heart Surgery;  Laterality: Right;   EXTRACORPOREAL SHOCK WAVE LITHOTRIPSY Left 12/07/2021   Procedure: EXTRACORPOREAL SHOCK WAVE LITHOTRIPSY (ESWL);  Surgeon: Festus Aloe, MD;  Location: [facility];  Service: Urology;  Laterality: Left;   LEFT HEART CATH AND CORONARY ANGIOGRAPHY N/A 06/22/2018   Procedure: LEFT HEART CATH AND CORONARY ANGIOGRAPHY;  Surgeon: Nigel Mormon, MD;  Location: [facility];  Service: Cardiovascular;  Laterality: N/A;   surgical debridement  Right    Right foot ulcer   TEE WITHOUT CARDIOVERSION N/A 06/25/2018   Procedure: TRANSESOPHAGEAL ECHOCARDIOGRAM (TEE);  Surgeon: Rexene Alberts, MD;  Location: [place];  Service: Open Heart Surgery;  Laterality: N/A;    MEDICATIONS:  albuterol (VENTOLIN HFA) 108 (90 Base) MCG/ACT inhaler   atorvastatin (LIPITOR) 80 MG tablet   benzonatate (TESSALON) 100 MG capsule   budesonide-formoterol (SYMBICORT) 160-4.5 MCG/ACT inhaler   Coenzyme Q10 (CO Q-10) 300 MG CAPS   glipiZIDE (GLUCOTROL XL) 5 MG 24 hr tablet   glucose blood (FREESTYLE LITE) test strip   hydrochlorothiazide (MICROZIDE) 12.5 MG capsule   HYDROcodone-acetaminophen (NORCO/VICODIN) 5-325 MG tablet   Lancets MISC   levocetirizine (XYZAL) 5 MG tablet   lisinopril (ZESTRIL) 10 MG tablet   metFORMIN (GLUCOPHAGE) 500 MG tablet   metoprolol tartrate (LOPRESSOR) 25 MG tablet   Multiple Vitamin (MULTIVITAMIN WITH MINERALS) TABS tablet   nitroGLYCERIN (NITRODUR - DOSED IN MG/24 HR) 0.2 mg/hr patch   nitroGLYCERIN (NITRODUR - DOSED IN MG/24 HR) 0.2 mg/hr patch   pentoxifylline (TRENTAL) 400 MG CR tablet   pentoxifylline (TRENTAL)  400 MG CR tablet   sildenafil (REVATIO) 20 MG tablet   sulfamethoxazole-trimethoprim (BACTRIM DS) 800-160 MG tablet   tadalafil (CIALIS) 10 MG tablet   tamsulosin (FLOMAX) 0.4 MG CAPS capsule   Testosterone 30 MG/ACT SOLN   vitamin B-12 (CYANOCOBALAMIN) 1000 MCG tablet   No current facility-administered medications for this encounter.    If no changes, I anticipate pt can proceed with surgery as scheduled.   Willeen Cass, PhD, FNP-BC [facility] Short Stay Surgical Center/Anesthesiology Phone: [phone] 01/04/2022 3:53 PM

## 2022-01-04 NOTE — Anesthesia Preprocedure Evaluation (Addendum)
Anesthesia Evaluation  Patient identified by MRN, date of birth, ID band Patient awake    Reviewed: Allergy & Precautions, NPO status , Patient's Chart, lab work & pertinent test results  Airway Mallampati: II  TM Distance: >3 FB Neck ROM: Full    Dental no notable dental hx. (+) Teeth Intact, Implants, Dental Advisory Given   Pulmonary    Pulmonary exam normal breath sounds clear to auscultation       Cardiovascular hypertension, + CAD, + Past MI and + CABG  Normal cardiovascular exam Rhythm:Regular Rate:Normal  05/2018 TEE  EF 50-55%   Neuro/Psych  Neuromuscular disease    GI/Hepatic GERD  Medicated,  Endo/Other  diabetes, Type 2  Renal/GU Renal InsufficiencyRenal disease Nephrolitiasis  Lab Results      Component                Value               Date                      CREATININE               1.53 (H)            01/04/2022              K                        4.7                 01/04/2022                    Musculoskeletal  (+) Arthritis , Osteoarthritis,    Abdominal   Peds  Hematology Lab Results      Component                Value               Date                     HGB                      11.9 (L)            12/22/2021                HCT                      34.8 (L)            12/22/2021                  PLT                      256                 12/22/2021              Anesthesia Other Findings ALL: [place]  Reproductive/Obstetrics                           Anesthesia Physical Anesthesia Plan  ASA: 3  Anesthesia Plan: General   Post-op Pain Management:    Induction: Intravenous  PONV Risk Score and Plan: 3 and Treatment may vary due to age or medical condition, Midazolam and Ondansetron  Airway Management Planned: LMA  Additional Equipment: None  Intra-op Plan:   Post-operative Plan:   Informed Consent: I have reviewed the patients History and  Physical, chart, labs and discussed the procedure including the risks, benefits and alternatives for the proposed anesthesia with the patient or authorized representative who has indicated his/her understanding and acceptance.     Dental advisory given  Plan Discussed with: CRNA and Anesthesiologist  Anesthesia Plan Comments: (See APP note by Durel Salts, FNP )      Anesthesia Quick Evaluation

## 2022-01-05 ENCOUNTER — Ambulatory Visit (HOSPITAL_BASED_OUTPATIENT_CLINIC_OR_DEPARTMENT_OTHER): Payer: 59 | Admitting: Anesthesiology

## 2022-01-05 ENCOUNTER — Ambulatory Visit (HOSPITAL_COMMUNITY): Payer: 59 | Admitting: Emergency Medicine

## 2022-01-05 ENCOUNTER — Ambulatory Visit (HOSPITAL_COMMUNITY)
Admission: RE | Admit: 2022-01-05 | Discharge: 2022-01-05 | Disposition: A | Discharge status: Home / Self Care | Payer: 59 | Attending: Urology | Admitting: Urology

## 2022-01-05 ENCOUNTER — Other Ambulatory Visit (HOSPITAL_BASED_OUTPATIENT_CLINIC_OR_DEPARTMENT_OTHER): Payer: Self-pay

## 2022-01-05 ENCOUNTER — Encounter (HOSPITAL_COMMUNITY)
Admission: RE | Disposition: A | Discharge status: Home / Self Care | Payer: Self-pay | Source: Home / Self Care | Attending: Urology

## 2022-01-05 ENCOUNTER — Encounter (HOSPITAL_COMMUNITY): Payer: Self-pay | Admitting: Urology

## 2022-01-05 ENCOUNTER — Ambulatory Visit (HOSPITAL_COMMUNITY): Payer: 59

## 2022-01-05 DIAGNOSIS — I10 Essential (primary) hypertension: Secondary | ICD-10-CM

## 2022-01-05 DIAGNOSIS — K219 Gastro-esophageal reflux disease without esophagitis: Secondary | ICD-10-CM | POA: Diagnosis not present

## 2022-01-05 DIAGNOSIS — Z951 Presence of aortocoronary bypass graft: Secondary | ICD-10-CM | POA: Insufficient documentation

## 2022-01-05 DIAGNOSIS — N201 Calculus of ureter: Secondary | ICD-10-CM

## 2022-01-05 DIAGNOSIS — I2510 Atherosclerotic heart disease of native coronary artery without angina pectoris: Secondary | ICD-10-CM

## 2022-01-05 DIAGNOSIS — E119 Type 2 diabetes mellitus without complications: Secondary | ICD-10-CM

## 2022-01-05 DIAGNOSIS — N132 Hydronephrosis with renal and ureteral calculous obstruction: Secondary | ICD-10-CM | POA: Diagnosis present

## 2022-01-05 DIAGNOSIS — I252 Old myocardial infarction: Secondary | ICD-10-CM | POA: Diagnosis not present

## 2022-01-05 DIAGNOSIS — Z7984 Long term (current) use of oral hypoglycemic drugs: Secondary | ICD-10-CM

## 2022-01-05 HISTORY — PX: CYSTOSCOPY/URETEROSCOPY/HOLMIUM LASER/STENT PLACEMENT: SHX6546

## 2022-01-05 LAB — GLUCOSE, CAPILLARY
Glucose-Capillary: 106 mg/dL — ABNORMAL HIGH (ref 70–99)
Glucose-Capillary: 108 mg/dL — ABNORMAL HIGH (ref 70–99)

## 2022-01-05 SURGERY — CYSTOSCOPY/URETEROSCOPY/HOLMIUM LASER/STENT PLACEMENT
Anesthesia: General | Site: Ureter | Laterality: Left

## 2022-01-05 MED ORDER — CHLORHEXIDINE GLUCONATE 0.12 % MT SOLN
15.0000 mL | Freq: Once | OROMUCOSAL | Status: AC
  Administered 2022-01-05: 15 mL via OROMUCOSAL

## 2022-01-05 MED ORDER — PROPOFOL 10 MG/ML IV BOLUS
INTRAVENOUS | Status: AC
Start: 2022-01-05 — End: ?
  Filled 2022-01-05: qty 20

## 2022-01-05 MED ORDER — ORAL CARE MOUTH RINSE: 15.0000 mL | Freq: Once | OROMUCOSAL | Status: AC

## 2022-01-05 MED ORDER — PHENYLEPHRINE 80 MCG/ML (10ML) SYRINGE FOR IV PUSH (FOR BLOOD PRESSURE SUPPORT)
PREFILLED_SYRINGE | INTRAVENOUS | Status: DC | PRN
  Administered 2022-01-05: 80 ug via INTRAVENOUS

## 2022-01-05 MED ORDER — CEFAZOLIN SODIUM-DEXTROSE 2-4 GM/100ML-% IV SOLN
2.0000 g | INTRAVENOUS | Status: AC
  Administered 2022-01-05: 2 g via INTRAVENOUS

## 2022-01-05 MED ORDER — PROPOFOL 10 MG/ML IV BOLUS
INTRAVENOUS | Status: DC | PRN
  Administered 2022-01-05: 170 mg via INTRAVENOUS

## 2022-01-05 MED ORDER — DEXAMETHASONE SODIUM PHOSPHATE 10 MG/ML IJ SOLN
INTRAMUSCULAR | Status: AC
  Filled 2022-01-05: qty 1

## 2022-01-05 MED ORDER — MIDAZOLAM HCL 2 MG/2ML IJ SOLN
INTRAMUSCULAR | Status: AC
  Filled 2022-01-05: qty 2

## 2022-01-05 MED ORDER — SODIUM CHLORIDE 0.9 % IR SOLN
Status: DC | PRN
  Administered 2022-01-05: 3000 mL via INTRAVESICAL

## 2022-01-05 MED ORDER — KETOROLAC TROMETHAMINE 30 MG/ML IJ SOLN
INTRAMUSCULAR | Status: AC
  Filled 2022-01-05: qty 1

## 2022-01-05 MED ORDER — FENTANYL CITRATE (PF) 100 MCG/2ML IJ SOLN
INTRAMUSCULAR | Status: DC | PRN
Start: 2022-01-05 — End: 2022-01-05
  Administered 2022-01-05 (×2): 25 ug via INTRAVENOUS

## 2022-01-05 MED ORDER — PHENYLEPHRINE 80 MCG/ML (10ML) SYRINGE FOR IV PUSH (FOR BLOOD PRESSURE SUPPORT)
PREFILLED_SYRINGE | INTRAVENOUS | Status: AC
  Filled 2022-01-05: qty 10

## 2022-01-05 MED ORDER — DEXMEDETOMIDINE (PRECEDEX) IN NS 20 MCG/5ML (4 MCG/ML) IV SYRINGE
PREFILLED_SYRINGE | INTRAVENOUS | Status: DC | PRN
  Administered 2022-01-05: 4 ug via INTRAVENOUS
  Administered 2022-01-05: 8 ug via INTRAVENOUS

## 2022-01-05 MED ORDER — FENTANYL CITRATE (PF) 100 MCG/2ML IJ SOLN
INTRAMUSCULAR | Status: AC
  Filled 2022-01-05: qty 2

## 2022-01-05 MED ORDER — MIDAZOLAM HCL 5 MG/5ML IJ SOLN
INTRAMUSCULAR | Status: DC | PRN
  Administered 2022-01-05: 2 mg via INTRAVENOUS

## 2022-01-05 MED ORDER — LIDOCAINE HCL (PF) 2 % IJ SOLN
INTRAMUSCULAR | Status: AC
  Filled 2022-01-05: qty 5

## 2022-01-05 MED ORDER — LACTATED RINGERS IV SOLN: INTRAVENOUS | Status: DC

## 2022-01-05 MED ORDER — HYDROMORPHONE HCL 1 MG/ML IJ SOLN: 0.2500 mg | INTRAMUSCULAR | Status: DC | PRN

## 2022-01-05 MED ORDER — CEFAZOLIN SODIUM-DEXTROSE 2-4 GM/100ML-% IV SOLN
INTRAVENOUS | Status: AC
  Filled 2022-01-05: qty 100

## 2022-01-05 MED ORDER — IOHEXOL 300 MG/ML  SOLN
INTRAMUSCULAR | Status: DC | PRN
  Administered 2022-01-05: 3 mL

## 2022-01-05 MED ORDER — ONDANSETRON HCL 4 MG/2ML IJ SOLN
INTRAMUSCULAR | Status: AC
  Filled 2022-01-05: qty 2

## 2022-01-05 MED ORDER — KETOROLAC TROMETHAMINE 30 MG/ML IJ SOLN
INTRAMUSCULAR | Status: DC | PRN
  Administered 2022-01-05: 30 mg via INTRAVENOUS

## 2022-01-05 MED ORDER — ACETAMINOPHEN 10 MG/ML IV SOLN: 1000.0000 mg | Freq: Once | INTRAVENOUS | Status: DC | PRN

## 2022-01-05 MED ORDER — ONDANSETRON HCL 4 MG/2ML IJ SOLN
INTRAMUSCULAR | Status: DC | PRN
  Administered 2022-01-05: 4 mg via INTRAVENOUS

## 2022-01-05 MED ORDER — LIDOCAINE 2% (20 MG/ML) 5 ML SYRINGE
INTRAMUSCULAR | Status: DC | PRN
  Administered 2022-01-05: 100 mg via INTRAVENOUS

## 2022-01-05 MED ORDER — OXYCODONE HCL 5 MG PO TABS
5.0000 mg | ORAL_TABLET | ORAL | 0 refills | Status: DC | PRN
  Filled 2022-01-05: qty 10, 2d supply, fill #0

## 2022-01-05 MED ORDER — AMISULPRIDE (ANTIEMETIC) 5 MG/2ML IV SOLN: 10.0000 mg | Freq: Once | INTRAVENOUS | Status: DC | PRN

## 2022-01-05 MED ORDER — DEXAMETHASONE SODIUM PHOSPHATE 10 MG/ML IJ SOLN
INTRAMUSCULAR | Status: DC | PRN
  Administered 2022-01-05: 10 mg via INTRAVENOUS

## 2022-01-05 MED ORDER — DEXMEDETOMIDINE (PRECEDEX) IN NS 20 MCG/5ML (4 MCG/ML) IV SYRINGE
PREFILLED_SYRINGE | INTRAVENOUS | Status: AC
  Filled 2022-01-05: qty 5

## 2022-01-05 MED ORDER — ONDANSETRON HCL 4 MG/2ML IJ SOLN: 4.0000 mg | Freq: Once | INTRAMUSCULAR | Status: DC | PRN

## 2022-01-05 MED ORDER — OXYCODONE HCL 5 MG/5ML PO SOLN: 5.0000 mg | Freq: Once | ORAL | Status: DC | PRN

## 2022-01-05 MED ORDER — OXYCODONE HCL 5 MG PO TABS: 5.0000 mg | ORAL_TABLET | Freq: Once | ORAL | Status: DC | PRN

## 2022-01-05 SURGICAL SUPPLY — 22 items
BAG URO CATCHER STRL LF (MISCELLANEOUS) ×2 IMPLANT
BASKET LASER NITINOL 1.9FR (BASKET) IMPLANT
BASKET ZERO TIP NITINOL 2.4FR (BASKET) ×1 IMPLANT
CATH URETL OPEN END 6FR 70 (CATHETERS) ×2 IMPLANT
CLOTH BEACON ORANGE TIMEOUT ST (SAFETY) ×2 IMPLANT
EXTRACTOR STONE 1.7FRX115CM (UROLOGICAL SUPPLIES) IMPLANT
GLOVE BIO SURGEON STRL SZ7.5 (GLOVE) ×2 IMPLANT
GOWN STRL REUS W/ TWL XL LVL3 (GOWN DISPOSABLE) ×1 IMPLANT
GOWN STRL REUS W/TWL XL LVL3 (GOWN DISPOSABLE) ×1
GUIDEWIRE ANG ZIPWIRE 038X150 (WIRE) IMPLANT
GUIDEWIRE STR DUAL SENSOR (WIRE) ×2 IMPLANT
KIT TURNOVER KIT A (KITS) IMPLANT
LASER FIB FLEXIVA PULSE ID 365 (Laser) IMPLANT
MANIFOLD NEPTUNE II (INSTRUMENTS) ×2 IMPLANT
PACK CYSTO (CUSTOM PROCEDURE TRAY) ×2 IMPLANT
SHEATH NAVIGATOR HD 11/13X28 (SHEATH) IMPLANT
SHEATH NAVIGATOR HD 11/13X36 (SHEATH) IMPLANT
STENT URET 6FRX26 CONTOUR (STENTS) ×1 IMPLANT
TRACTIP FLEXIVA PULS ID 200XHI (Laser) IMPLANT
TRACTIP FLEXIVA PULSE ID 200 (Laser)
TUBING CONNECTING 10 (TUBING) ×2 IMPLANT
TUBING UROLOGY SET (TUBING) ×2 IMPLANT

## 2022-01-05 NOTE — Transfer of Care (Signed)
Immediate Anesthesia Transfer of Care Note  [name]  Procedure(s) Performed: CYSTOSCOPY LEFT URETERAL STENT REMOVAL  LEFT RETROGRADE PYELOGRAM URETEROSCOPY BASKET STONE EXTRACTION STENT PLACEMENT (Left: Ureter)  Patient Location: PACU  Anesthesia Type:General  Level of Consciousness: drowsy  Airway & Oxygen Therapy: Patient Spontanous Breathing and Patient connected to face mask oxygen  Post-op Assessment: Report given to RN and Post -op Vital signs reviewed and stable  Post vital signs: Reviewed and stable  Last Vitals:  Vitals Value Taken Time  BP 116/80 01/05/22 1515  Temp    Pulse 74 01/05/22 1516  Resp 13 01/05/22 1516  SpO2 100 % 01/05/22 1516  Vitals shown include unvalidated device data.  Last Pain:  Vitals:   01/05/22 1418  TempSrc: Oral  PainSc:          Complications: No notable events documented.

## 2022-01-05 NOTE — H&P (Signed)
CC/HPI: 12/04/2021: Mr. Wellbrock is a 61 year old gentleman who presents to my clinic as an add-on for kidney stone. He was seen by his primary care provider on Friday complaining of intermittent left lower quadrant pain. A CT scan was obtained. I reviewed the images and it shows right a 1 cm stone in the proximal left ureter with some associated hydroureteronephrosis.   Of note on labs at his primary care doctor's office his creatinine was elevated. His white count was normal and his urinalysis was unremarkable at that time.   Today Mr. Newstrom continues to endorse some left lower quadrant pain although it is controlled. He also endorses fatigue.   I rechecked a stat creatinine today which shows it is persistently elevated.   12/21/2021: Patient underwent urgent left ureteral stenting following last office visit assessment. He then underwent shockwave lithotripsy on the 11th of this month. Now back today for follow-up exam with KUB and possible stent removal.   Denies interval stone material passage following lithotripsy. He is only had occasional twinges of left-sided pain and discomfort. He has had increased urinary frequency and urgency but tolerating it appropriately. Denies dysuria or gross hematuria. No interval fevers or chills, nausea/vomiting     ALLERGIES: Jardiance    MEDICATIONS: Tamsulosin Hcl 0.4 mg capsule 1 capsule PO Daily  Testosterone 30 mg/1.5 ml per actuation solution in metered-dose pump with appl. 2 pump Topical Daily     GU PSH: Cystoscopy Insert Stent - 12/04/2021 ESWL - 12/07/2021     NON-GU PSH: Coronary Artery Bypass Grafting Toe amputation     GU PMH: Acute kidney failure - 12/04/2021 Ureteral calculus - 12/04/2021 Ureteral obstruction secondary to calculous - 12/04/2021 BPH w/o LUTS - 11/23/2021 ED due to arterial insufficiency - 11/23/2021 Encounter for Prostate Cancer screening - 11/23/2021    NON-GU PMH: Diabetes Type 2 GERD Hypertension Myocardial  Infarction    FAMILY HISTORY: 2 sons - Other Dementia - Runs in Family Diabetes - Mother Hypertension - Runs in Family nephrolithiasis - Sister   SOCIAL HISTORY: Marital Status: Divorced    REVIEW OF SYSTEMS:    GU Review Male:   Patient reports frequent urination and hard to postpone urination. Patient denies burning/ pain with urination, get up at night to urinate, leakage of urine, stream starts and stops, trouble starting your stream, have to strain to urinate , erection problems, and penile pain.  Gastrointestinal (Upper):   Patient denies nausea, vomiting, and indigestion/ heartburn.  Gastrointestinal (Lower):   Patient denies diarrhea and constipation.  Constitutional:   Patient denies fever, night sweats, weight loss, and fatigue.  Skin:   Patient denies skin rash/ lesion and itching.  Eyes:   Patient denies blurred vision and double vision.  Ears/ Nose/ Throat:   Patient denies sore throat and sinus problems.  Hematologic/Lymphatic:   Patient denies swollen glands and easy bruising.  Cardiovascular:   Patient denies leg swelling and chest pains.  Respiratory:   Patient denies cough and shortness of breath.  Endocrine:   Patient denies excessive thirst.  Musculoskeletal:   Patient denies back pain and joint pain.  Neurological:   Patient denies headaches and dizziness.  Psychologic:   Patient denies depression and anxiety.   VITAL SIGNS:      12/21/2021 12:58 PM  BP 112/71 mmHg  Pulse 91 /min  Temperature 97.7 F / 36.5 C   MULTI-SYSTEM PHYSICAL EXAMINATION:    Constitutional: Well-nourished. No physical deformities. Normally developed. Good grooming.  Neck: Neck symmetrical, not  swollen. Normal tracheal position.  Respiratory: No labored breathing, no use of accessory muscles.   Cardiovascular: Normal temperature, normal extremity pulses, no swelling, no varicosities.  Skin: No paleness, no jaundice, no cyanosis. No lesion, no ulcer, no rash.  Neurologic / Psychiatric:  Oriented to time, oriented to place, oriented to person. No depression, no anxiety, no agitation.  Gastrointestinal: No mass, no tenderness, no rigidity, non obese abdomen.  Eyes: Normal conjunctivae. Normal eyelids.  Musculoskeletal: Normal gait and station of head and neck.     Complexity of Data:  Source Of History:  Patient, Medical Record Summary  Records Review:   Previous Doctor Records, Previous Hospital Records, Previous Patient Records  Urine Test Review:   Urinalysis  X-Ray Review: KUB: Reviewed Films. Discussed With Patient.  C.T. Abdomen/Pelvis: Reviewed Films. Reviewed Report.     12/06/21  General Chemistry  Testosterone, Total 144.5 ng/dL    12/06/21 12/04/21  General Chemistry  BUN 28 mg/dL 32.0 mg/dL  Creatinine 1.4 mg/dL 2.7 mg/dL  eGFR African American 63.3  29 mL/min  eGFR Non-Afr. American 54.6  25 mL/min  CBC  Hemoglobin 14.0 g/dL   Hematocrit % 40.7 %    PROCEDURES:         KUB - 74018  A single view of the abdomen is obtained. Previously identified left proximal ureteral calculi has fragmented into 2 distinct pieces both identified adjacent to previously placed left ureteral stent which remains in appropriate position. No other additional opacities consistent with ureteral calculi identified on today's exam.      Patient confirmed No Neulasta OnPro Device.           Urinalysis w/Scope Dipstick Dipstick Cont'd Micro  Color: Amber Bilirubin: Neg mg/dL WBC/hpf: 6 - 10/hpf  Appearance: Slightly Cloudy Ketones: Neg mg/dL RBC/hpf: >60/hpf  Specific Gravity: 1.025 Blood: 3+ ery/uL Bacteria: Mod (26-50/hpf)  pH: 6.0 Protein: 2+ mg/dL Cystals: NS (Not Seen)  Glucose: Neg mg/dL Urobilinogen: 0.2 mg/dL Casts: NS (Not Seen)    Nitrites: Neg Trichomonas: Not Present    Leukocyte Esterase: 2+ leu/uL Mucous: Present      Epithelial Cells: 0 - 5/hpf      Yeast: NS (Not Seen)      Sperm: Not Present    ASSESSMENT:      ICD-10 Details  1 GU:   Ureteral  calculus - N20.1   2   Ureteral obstruction secondary to calculous - N13.2    PLAN:           Orders Labs Urine Culture          Schedule Return Visit/Planned Activity: Next Available Appointment - Follow up MD, Schedule Surgery          Document Letter(s):  Created for Patient: Clinical Summary         Notes:   Joaquim Lai has fractured into 2 distinct pieces still remaining along the left proximal ureter. Risks of removing his stent today far outweigh the benefits. He will need ureteroscopy for definitive management. I will confirm with his urologist that this is the appropriate course of action.   For ureteroscopy I described the risks which include heart attack, stroke, pulmonary embolus, death, bleeding, infection, damage to contiguous structures, positioning injury, ureteral stricture, ureteral avulsion, ureteral injury, need for ureteral stent, inability to perform ureteroscopy, need for an interval procedure, inability to clear stone burden, stent discomfort and pain.   Urine culture sent today. Once I have discussed with Dr. Gloriann Loan, the patient will be contacted  and scheduled appropriately.    Signed by Jiles Crocker, NP on 12/21/21 at 1:27 PM (EDT

## 2022-01-05 NOTE — Anesthesia Postprocedure Evaluation (Signed)
Anesthesia Post Note  [name]  Procedure(s) Performed: CYSTOSCOPY LEFT URETERAL STENT REMOVAL  LEFT RETROGRADE PYELOGRAM URETEROSCOPY BASKET STONE EXTRACTION STENT PLACEMENT (Left: Ureter)     Patient location during evaluation: PACU Anesthesia Type: General Level of consciousness: awake and alert Pain management: pain level controlled Vital Signs Assessment: post-procedure vital signs reviewed and stable Respiratory status: spontaneous breathing, nonlabored ventilation, respiratory function stable and patient connected to nasal cannula oxygen Cardiovascular status: blood pressure returned to baseline and stable Postop Assessment: no apparent nausea or vomiting Anesthetic complications: no   No notable events documented.  Last Vitals:  Vitals:   01/05/22 1530 01/05/22 1545  BP: 125/76 117/78  Pulse: 74 70  Resp: 16 14  Temp:    SpO2: 93% 96%    Last Pain:  Vitals:   01/05/22 1545  TempSrc:   PainSc: 0-No pain                 Barnet Glasgow

## 2022-01-05 NOTE — Op Note (Signed)
Operative Note  Preoperative diagnosis:  1.  Left ureteral calculi  Postoperative diagnosis: 1.  Left ureteral calculi  Procedure(s): 1.  Cystoscopy with left retrograde pyelogram, left ureteroscopy with stone extraction, ureteral stent exchange  Surgeon: Link Snuffer, MD  Assistants: None  Anesthesia: General  Complications: None immediate  EBL: Minimal  Specimens: 1.  Renal calculi  Drains/Catheters: 1.  6 x 26 double-J ureteral stent  Intraoperative findings: 1.  Normal anterior urethra 2.  Borderline obstructing prostate 3.  Normal bladder mucosa without any masses or tumors. 4.  Left ureteroscopy revealed 2 larger stone fragments and 1 smaller stone fragment both of which were able to be basket extracted atraumatically.  Retrograde pyelogram afterwards revealed no evidence of hydronephrosis or filling defect.  Indication: 61 year old male with left ureteral calculi presents for the previously mentioned operation.  He failed ESWL.  Description of procedure:  The patient was identified and consent was obtained.  The patient was taken to the operating room and placed in the supine position.  The patient was placed under general anesthesia.  Perioperative antibiotics were administered.  The patient was placed in dorsal lithotomy.  Patient was prepped and draped in a standard sterile fashion and a timeout was performed.  A 21 French rigid cystoscope was advanced into the urethra and into the bladder.  Complete cystoscopy was performed with no abnormal findings.  The left stent was grasped and pulled just beyond the urethral meatus.  A sensor wire was advanced through this and into the kidney under fluoroscopic guidance.  Stent was withdrawn.  Wire was secured to the drape as a safety wire.  Semirigid ureteroscopy was performed alongside the wire.  The ureter was wide open.  The stone was basketed and able to be extracted without any significant trauma.  I reintroduced the scope and  basket distracted to other ureteral stones.  I then advanced the scope up to the renal pelvis and no other calculi were seen.  I shot a retrograde pyelogram through the scope with findings noted above.  I withdrew the scope visualizing the ureter upon removal and again no ureteral calculi were seen.  No ureteral injury was identified.  I backloaded the wire onto rigid cystoscope and advanced that into the bladder followed by routine placement of a 6 x 26 double-J ureteral stent.  Fluoroscopy confirmed proximal placement and direct visualization confirmed a good coil within the bladder.  I drained the bladder and withdrew the scope.  Patient tolerated procedure well was stable postoperative.  Plan: Follow-up in 5 to 7 days for stent removal.

## 2022-01-05 NOTE — Anesthesia Procedure Notes (Signed)
Procedure Name: LMA Insertion Date/Time: 01/05/2022 2:36 PM  Performed by: Sharlette Dense, CRNAPatient Re-evaluated:Patient Re-evaluated prior to induction Oxygen Delivery Method: Circle system utilized Preoxygenation: Pre-oxygenation with 100% oxygen Induction Type: IV induction LMA: LMA with gastric port inserted LMA Size: 4.0 Number of attempts: 1 Placement Confirmation: positive ETCO2 and breath sounds checked- equal and bilateral Tube secured with: Tape Dental Injury: Teeth and Oropharynx as per pre-operative assessment

## 2022-01-05 NOTE — Discharge Instructions (Addendum)

## 2022-01-06 ENCOUNTER — Encounter (HOSPITAL_COMMUNITY): Payer: Self-pay | Admitting: Urology

## 2022-01-13 ENCOUNTER — Encounter: Payer: Self-pay | Admitting: Orthopedic Surgery

## 2022-01-13 NOTE — Progress Notes (Signed)
Office Visit Note   [name]           Date of Birth: Jun 29, 1961           MRN: [id number] Visit Date: 01/04/2022              Requested by: Mackie Pai, PA-C [place],  [place] [id number] PCP: Mackie Pai, PA-C  Chief Complaint  Patient presents with   Left Foot - Routine Post Op    12/22/2021 left transmet amputation with Kerecis graft       HPI: Patient is a 61 year old gentleman status post left transmetatarsal amputation with Kerecis graft on 12/22/2021 currently in compression socks using nitroglycerin and Trental.  Nonweightbearing with a kneeling scooter.  Assessment & Plan: Visit Diagnoses:  1. History of transmetatarsal amputation of left foot ([facility])     Plan: Continue with protected weightbearing and wound care  Follow-Up Instructions: Return in about 2 weeks (around 01/18/2022).   Ortho Exam  Patient is alert, oriented, no adenopathy, well-dressed, normal affect, normal respiratory effort. Examination patient has a triphasic dorsalis pedis and posterior tibial pulse with good arterial circulation to the ankle.  There is a thick black eschar over the residual limb with granulation tissue around the edges the eschar is currently 3 x 6 cm.  Imaging: No results found.   Labs: Lab Results  Component Value Date   HGBA1C 6.2 (H) 01/04/2022   HGBA1C 7.3 (A) 06/14/2021   HGBA1C 7.4 (A) 01/27/2021   REPTSTATUS 12/27/2021 FINAL 12/22/2021   GRAMSTAIN  12/22/2021    RARE WBC PRESENT,BOTH PMN AND MONONUCLEAR NO ORGANISMS SEEN    CULT  12/22/2021    RARE METHICILLIN RESISTANT STAPHYLOCOCCUS AUREUS NO ANAEROBES ISOLATED Performed at [facility], [place] [address], [place], [place] [address]    LABORGA METHICILLIN RESISTANT STAPHYLOCOCCUS AUREUS 12/22/2021     Lab Results  Component Value Date   ALBUMIN 4.3 05/01/2021   ALBUMIN 3.9 05/08/2019   ALBUMIN 2.7 (L) 06/23/2018    Lab Results  Component Value  Date   MG 2.1 05/08/2019   MG 2.2 06/26/2018   MG 3.0 (H) 06/26/2018   No results found for: "VD25OH"  No results found for: "PREALBUMIN"    Latest Ref Rng & Units 12/22/2021    5:56 AM 12/01/2021    3:48 PM 11/21/2021   12:14 PM  CBC EXTENDED  WBC 4.0 - 10.5 K/uL 7.7  10.6  11.1   RBC 4.22 - 5.81 MIL/uL 3.76  4.27  4.49   Hemoglobin 13.0 - 17.0 g/dL 11.9  13.6  14.0   HCT 39.0 - 52.0 % 34.8  38.5  42.1   Platelets 150 - 400 K/uL 256  405  251.0   NEUT# 1,500 - 7,800 cells/uL  7,780  9.0   Lymph# 850 - 3,900 cells/uL  1,866  1.1      There is no height or weight on file to calculate BMI.  Orders:  No orders of the defined types were placed in this encounter.  No orders of the defined types were placed in this encounter.    Procedures: No procedures performed  Clinical Data: No additional findings.  ROS:  All other systems negative, except as noted in the HPI. Review of Systems  Objective: Vital Signs: There were no vitals taken for this visit.  Specialty Comments:  No specialty comments available.  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   Acute coronary syndrome ([place]) 06/22/2018   Nonhealing skin ulcer ([place]) 06/22/2018   Coronary artery disease involving native coronary artery with unstable angina pectoris ([place]) 06/22/2018   Acute myocardial infarction ([place]) 06/22/2018   Hyperlipidemia    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   Vertigo 08/20/2016   Myringotomy tube status 08/20/2016   Ear fullness, left 08/20/2016   Asymmetrical left sensorineural  hearing loss 02/06/2016   Neuropathy of left foot 02/03/2015   Charcot's joint of left foot, non-diabetic 02/03/2015   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   Cramping of hands 06/02/2014   Eustachian tube dysfunction 06/02/2014   HTN (hypertension) 05/07/2014   Obstructive sleep apnea 05/07/2014   Past Medical History:  Diagnosis Date   Acute myocardial infarction ([place]) 06/22/2018   Allergy    Basal cell carcinoma    Charcot's joint of left foot, non-diabetic    Coronary artery disease involving native coronary artery with unstable angina pectoris ([place]) 06/22/2018   Diabetes ([place])    type 2   GERD (gastroesophageal reflux disease)    History of kidney stones    passed   Hyperlipidemia    Hypertension    Neuropathy of left foot    Pneumonia    As a child   Right foot ulcer ([place])    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   LIMA to LAD, SVG to Diag, SVG to OM2, SVG to [name], EVH via right thigh and leg   Type II diabetes mellitus with complication, uncontrolled    Uncontrolled type 2 diabetes mellitus     Family History  Problem Relation Age of Onset   Diabetes Mother    Stroke Father    Hypertension Father    Hearing loss Father    Colon polyps Father    Colon cancer Neg Hx    Esophageal cancer Neg Hx    Prostate cancer Neg Hx     Past Surgical History:  Procedure Laterality Date   AMPUTATION Right 01/06/2020   Procedure: RIGHT FOOT 2ND RAY AMPUTATION;  Surgeon: Newt Minion, MD;  Location: [facility];  Service: Orthopedics;  Laterality: Right;   AMPUTATION Left 01/11/2021   Procedure: LEFT FOOT 3RD RAY AMPUTATION;  Surgeon: Newt Minion, MD;  Location: [facility];  Service: Orthopedics;  Laterality: Left;   AMPUTATION Left 12/22/2021   Procedure: LEFT TRANSMETATARSAL AMPUTATION;  Surgeon: Newt Minion, MD;  Location: [facility];  Service: Orthopedics;  Laterality: Left;   APPLICATION OF WOUND VAC  12/22/2021   Procedure: APPLICATION OF WOUND VAC;  Surgeon: Newt Minion, MD;  Location: [facility];   Service: Orthopedics;;   COLONOSCOPY W/ POLYPECTOMY     CORONARY ARTERY BYPASS GRAFT N/A 06/25/2018   Procedure: CORONARY ARTERY BYPASS GRAFTING (CABG) TIMES FOUR USING LEFT INTERNAL MAMMARY ARTERY AND RIGHT ENDOSCOPICALLY HARVESTED SAPHENOUS VEIN;  Surgeon: Rexene Alberts, MD;  Location: [facility];  Service: Open Heart Surgery;  Laterality: N/A;   CYSTOSCOPY WITH STENT PLACEMENT Left 12/04/2021   Procedure: CYSTOSCOPY/RETROGRADE/ STENT PLACEMENT;  Surgeon: Terrilee Files  L, MD;  Location: [facility];  Service: Urology;  Laterality: Left;   CYSTOSCOPY/URETEROSCOPY/HOLMIUM LASER/STENT PLACEMENT Left 01/05/2022   Procedure: CYSTOSCOPY LEFT URETERAL STENT REMOVAL  LEFT RETROGRADE PYELOGRAM URETEROSCOPY BASKET STONE EXTRACTION STENT PLACEMENT;  Surgeon: Lucas Mallow, MD;  Location: [facility];  Service: Urology;  Laterality: Left;  1 HR FOR CASE   ENDOVEIN HARVEST OF GREATER SAPHENOUS VEIN Right 06/25/2018   Procedure: ENDOVEIN HARVEST OF GREATER SAPHENOUS VEIN;  Surgeon: Rexene Alberts, MD;  Location: [facility];  Service: Open Heart Surgery;  Laterality: Right;   EXTRACORPOREAL SHOCK WAVE LITHOTRIPSY Left 12/07/2021   Procedure: EXTRACORPOREAL SHOCK WAVE LITHOTRIPSY (ESWL);  Surgeon: Festus Aloe, MD;  Location: [facility];  Service: Urology;  Laterality: Left;   LEFT HEART CATH AND CORONARY ANGIOGRAPHY N/A 06/22/2018   Procedure: LEFT HEART CATH AND CORONARY ANGIOGRAPHY;  Surgeon: Nigel Mormon, MD;  Location: [facility];  Service: Cardiovascular;  Laterality: N/A;   surgical debridement  Right    Right foot ulcer   TEE WITHOUT CARDIOVERSION N/A 06/25/2018   Procedure: TRANSESOPHAGEAL ECHOCARDIOGRAM (TEE);  Surgeon: Rexene Alberts, MD;  Location: [facility];  Service: Open Heart Surgery;  Laterality: N/A;   Social History   Occupational History   Occupation: attorney  Tobacco Use   Smoking status: Never   Smokeless tobacco: Never  Vaping Use   Vaping Use: Never used   Substance and Sexual Activity   Alcohol use: Yes    Alcohol/week: 2.0 standard drinks of alcohol    Types: 2 Cans of beer per week    Comment: Rare   Drug use: Never   Sexual activity: Not on file

## 2022-01-15 ENCOUNTER — Other Ambulatory Visit (HOSPITAL_BASED_OUTPATIENT_CLINIC_OR_DEPARTMENT_OTHER): Payer: Self-pay

## 2022-01-16 ENCOUNTER — Encounter: Payer: Self-pay | Admitting: Physician Assistant

## 2022-01-16 ENCOUNTER — Other Ambulatory Visit (HOSPITAL_COMMUNITY): Payer: Self-pay

## 2022-01-16 ENCOUNTER — Ambulatory Visit (INDEPENDENT_AMBULATORY_CARE_PROVIDER_SITE_OTHER): Payer: 59 | Admitting: Physician Assistant

## 2022-01-16 DIAGNOSIS — Z85828 Personal history of other malignant neoplasm of skin: Secondary | ICD-10-CM

## 2022-01-16 DIAGNOSIS — L570 Actinic keratosis: Secondary | ICD-10-CM

## 2022-01-16 DIAGNOSIS — D485 Neoplasm of uncertain behavior of skin: Secondary | ICD-10-CM | POA: Diagnosis not present

## 2022-01-16 DIAGNOSIS — Z1283 Encounter for screening for malignant neoplasm of skin: Secondary | ICD-10-CM

## 2022-01-16 NOTE — Patient Instructions (Signed)

## 2022-01-17 ENCOUNTER — Encounter: Payer: Self-pay | Admitting: Physician Assistant

## 2022-01-17 ENCOUNTER — Other Ambulatory Visit (HOSPITAL_BASED_OUTPATIENT_CLINIC_OR_DEPARTMENT_OTHER): Payer: Self-pay

## 2022-01-17 NOTE — Progress Notes (Signed)
   Follow-Up Visit   Subjective  Joshua Branch is a 61 y.o. male who presents for the following: Skin Problem (Patient here today to have a lesion on his scalp checked x 1 year no bleeding, no pain. Personal history of non mole skin cancer. No family history of atypical moles, melanoma or non mole skin cancer.).   The following portions of the chart were reviewed this encounter and updated as appropriate:  Tobacco  Allergies  Meds  Problems  Med Hx  Surg Hx  Fam Hx      Objective  Well appearing patient in no apparent distress; mood and affect are within normal limits.  A focused examination was performed including face, scalp and neck. Relevant physical exam findings are noted in the Assessment and Plan.  Waist up skin examination- No atypical nevi or signs of NMSC noted at the time of the visit.   Mid Parietal Scalp Hyperkeratotic scale with pink base   Right Mid Helix Erythematous patches with gritty scale.   Assessment & Plan  Encounter for screening for malignant neoplasm of skin  Yearly skin examination   Neoplasm of uncertain behavior of skin Mid Parietal Scalp  Skin / nail biopsy Type of biopsy: tangential   Informed consent: discussed and consent obtained   Timeout: patient name, date of birth, surgical site, and procedure verified   Procedure prep:  Patient was prepped and draped in usual sterile fashion (Non sterile) Prep type:  Chlorhexidine Anesthesia: the lesion was anesthetized in a standard fashion   Anesthetic:  1% lidocaine w/ epinephrine 1-100,000 local infiltration Instrument used: flexible razor blade   Outcome: patient tolerated procedure well   Post-procedure details: wound care instructions given    Specimen 1 - Surgical pathology Differential Diagnosis: r/o sk  Check Margins: No  AK (actinic keratosis) Right Mid Helix  Destruction of lesion - Right Mid Helix Complexity: simple   Destruction method: cryotherapy   Informed  consent: discussed and consent obtained   Timeout:  patient name, date of birth, surgical site, and procedure verified Lesion destroyed using liquid nitrogen: Yes   Cryotherapy cycles:  3 Outcome: patient tolerated procedure well with no complications      I, Dareon Nunziato, PA-C, have reviewed all documentation's for this visit.  The documentation on 01/17/22 for the exam, diagnosis, procedures and orders are all accurate and complete.

## 2022-01-18 ENCOUNTER — Other Ambulatory Visit (HOSPITAL_BASED_OUTPATIENT_CLINIC_OR_DEPARTMENT_OTHER): Payer: Self-pay

## 2022-01-18 ENCOUNTER — Encounter: Payer: Self-pay | Admitting: Orthopedic Surgery

## 2022-01-18 ENCOUNTER — Ambulatory Visit (INDEPENDENT_AMBULATORY_CARE_PROVIDER_SITE_OTHER): Payer: 59 | Admitting: Orthopedic Surgery

## 2022-01-18 DIAGNOSIS — Z89432 Acquired absence of left foot: Secondary | ICD-10-CM

## 2022-01-18 MED ORDER — PENTOXIFYLLINE ER 400 MG PO TBCR
400.0000 mg | EXTENDED_RELEASE_TABLET | Freq: Three times a day (TID) | ORAL | 3 refills | Status: DC
  Filled 2022-01-18 – 2022-01-26 (×2): qty 90, 30d supply, fill #0
  Filled 2022-02-26: qty 90, 30d supply, fill #1
  Filled 2022-04-01: qty 90, 30d supply, fill #2
  Filled 2022-05-18: qty 90, 30d supply, fill #3

## 2022-01-18 NOTE — Progress Notes (Signed)
Office Visit Note   [name]           Date of Birth: 31-Jul-1960           MRN: [id number] Visit Date: 01/18/2022              Requested by: Mackie Pai, PA-C [place],  [place] [id number] PCP: Mackie Pai, PA-C  Chief Complaint  Patient presents with   Left Foot - Routine Post Op    12/22/21 left transmet  amputation       HPI: Patient is a 61 year old gentleman who is seen in follow-up status post transmetatarsal amputation on the left he is about 3 weeks out.  He is currently wearing the Vive sock he has just completed his Bactrim DS.  Patient states he just had kidney stones removed.  Assessment & Plan: Visit Diagnoses:  1. History of transmetatarsal amputation of left foot ([place])     Plan: We will refill his prescription for Trental.  Continue with the Vive wear sock continue with nonweightbearing follow-up in 2 weeks  Follow-Up Instructions: Return in about 2 weeks (around 02/01/2022).   Ortho Exam  Patient is alert, oriented, no adenopathy, well-dressed, normal affect, normal respiratory effort. Examination patient has improved epithelialization around the wound edges.  There is a loose eschar.  Sutures are harvested today.  Imaging: No results found.    Labs: Lab Results  Component Value Date   HGBA1C 6.2 (H) 01/04/2022   HGBA1C 7.3 (A) 06/14/2021   HGBA1C 7.4 (A) 01/27/2021   REPTSTATUS 12/27/2021 FINAL 12/22/2021   GRAMSTAIN  12/22/2021    RARE WBC PRESENT,BOTH PMN AND MONONUCLEAR NO ORGANISMS SEEN    CULT  12/22/2021    RARE METHICILLIN RESISTANT STAPHYLOCOCCUS AUREUS NO ANAEROBES ISOLATED Performed at [facility], [place] [address] [place],  [address]    LABORGA METHICILLIN RESISTANT STAPHYLOCOCCUS AUREUS 12/22/2021     Lab Results  Component Value Date   ALBUMIN 4.3 05/01/2021   ALBUMIN 3.9 05/08/2019   ALBUMIN 2.7 (L) 06/23/2018    Lab Results  Component Value Date   MG 2.1  05/08/2019   MG 2.2 06/26/2018   MG 3.0 (H) 06/26/2018   No results found for: "VD25OH"  No results found for: "PREALBUMIN"    Latest Ref Rng & Units 12/22/2021    5:56 AM 12/01/2021    3:48 PM 11/21/2021   12:14 PM  CBC EXTENDED  WBC 4.0 - 10.5 K/uL 7.7  10.6  11.1   RBC 4.22 - 5.81 MIL/uL 3.76  4.27  4.49   Hemoglobin 13.0 - 17.0 g/dL 11.9  13.6  14.0   HCT 39.0 - 52.0 % 34.8  38.5  42.1   Platelets 150 - 400 K/uL 256  405  251.0   NEUT# 1,500 - 7,800 cells/uL  7,780  9.0   Lymph# 850 - 3,900 cells/uL  1,866  1.1      There is no height or weight on file to calculate BMI.  Orders:  No orders of the defined types were placed in this encounter.  Meds ordered this encounter  Medications   pentoxifylline (TRENTAL) 400 MG CR tablet    Sig: Take 1 tablet (400 mg total) by mouth 3 (three) times daily with meals.    Dispense:  90 tablet    Refill:  3     Procedures: No procedures performed  Clinical Data: No additional findings.  ROS:  All other systems negative, except as noted in the HPI. Review of Systems  Objective: Vital Signs: There were no vitals taken for this visit.  Specialty Comments:  No specialty comments available.  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 ([place]) 01/18/2021   Osteomyelitis of third toe of left foot ([name]    Cutaneous abscess of left foot    Osteomyelitis of second toe of right foot ([place])    Poorly controlled type 2 diabetes mellitus with circulatory disorder ([place]) 07/15/2018   Numbness in both hands 07/15/2018   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   Acute coronary syndrome ([place]) 06/22/2018   Nonhealing skin ulcer ([place]) 06/22/2018   Coronary artery disease involving native coronary artery with unstable angina pectoris ([place]) 06/22/2018   Acute myocardial infarction ([place]) 06/22/2018   Hyperlipidemia    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    Vertigo 08/20/2016   Myringotomy tube status 08/20/2016   Ear fullness, left 08/20/2016   Asymmetrical left sensorineural hearing loss 02/06/2016   Neuropathy of left foot 02/03/2015   Charcot's joint of left foot, non-diabetic 02/03/2015   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   Cramping of hands 06/02/2014   Eustachian tube dysfunction 06/02/2014   HTN (hypertension) 05/07/2014   Obstructive sleep apnea 05/07/2014   Past Medical History:  Diagnosis Date   Acute myocardial infarction ([place]) 06/22/2018   Allergy    Charcot's joint of left foot, non-diabetic    Coronary artery disease involving native coronary artery with unstable angina pectoris ([place]) 06/22/2018   Diabetes ([place])    type 2   GERD (gastroesophageal reflux disease)    History of kidney stones    passed   Hyperlipidemia    Hypertension    Neuropathy of left foot    Nodular basal cell carcinoma ([facility]) 07/10/2019   Right V of Neck (curet and 5FU)   Pneumonia    As a child   Right foot ulcer ([place])    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   LIMA to LAD, SVG to Diag, SVG to OM2, SVG to [name], EVH via right thigh and leg   Type II diabetes mellitus with complication, uncontrolled    Uncontrolled type 2 diabetes mellitus     Family History  Problem Relation Age of Onset   Diabetes Mother    Stroke Father    Hypertension Father    Hearing loss Father    Colon polyps Father    Colon cancer Neg Hx    Esophageal cancer Neg Hx    Prostate cancer Neg Hx     Past Surgical History:  Procedure Laterality Date   AMPUTATION Right 01/06/2020   Procedure: RIGHT FOOT 2ND RAY AMPUTATION;  Surgeon: Newt Minion, MD;  Location: [facility];  Service: Orthopedics;  Laterality: Right;   AMPUTATION Left 01/11/2021   Procedure: LEFT FOOT 3RD RAY AMPUTATION;  Surgeon: Newt Minion, MD;  Location: [facility];  Service: Orthopedics;  Laterality: Left;   AMPUTATION Left 12/22/2021   Procedure: LEFT TRANSMETATARSAL AMPUTATION;  Surgeon: Newt Minion, MD;   Location: [facility];  Service: Orthopedics;  Laterality: Left;   APPLICATION OF WOUND VAC  12/22/2021   Procedure: APPLICATION OF WOUND VAC;  Surgeon: Newt Minion, MD;  Location: [facility];  Service: Orthopedics;;   COLONOSCOPY W/ POLYPECTOMY     CORONARY ARTERY BYPASS GRAFT N/A 06/25/2018   Procedure:  CORONARY ARTERY BYPASS GRAFTING (CABG) TIMES FOUR USING LEFT INTERNAL MAMMARY ARTERY AND RIGHT ENDOSCOPICALLY HARVESTED SAPHENOUS VEIN;  Surgeon: Rexene Alberts, MD;  Location: [facility];  Service: Open Heart Surgery;  Laterality: N/A;   CYSTOSCOPY WITH STENT PLACEMENT Left 12/04/2021   Procedure: CYSTOSCOPY/RETROGRADE/ STENT PLACEMENT;  Surgeon: Vira Agar, MD;  [facility];  Service: Urology;  Laterality: Left;   CYSTOSCOPY/URETEROSCOPY/HOLMIUM LASER/STENT PLACEMENT Left 01/05/2022   Procedure: CYSTOSCOPY LEFT URETERAL STENT REMOVAL  LEFT RETROGRADE PYELOGRAM URETEROSCOPY BASKET STONE EXTRACTION STENT PLACEMENT;  Surgeon: Lucas Mallow, MD;  [facility];  Service: Urology;  Laterality: Left;  1 HR FOR CASE   ENDOVEIN HARVEST OF GREATER SAPHENOUS VEIN Right 06/25/2018   Procedure: ENDOVEIN HARVEST OF GREATER SAPHENOUS VEIN;  Surgeon: Rexene Alberts, MD;  Location: [facility];  Service: Open Heart Surgery;  Laterality: Right;   EXTRACORPOREAL SHOCK WAVE LITHOTRIPSY Left 12/07/2021   Procedure: EXTRACORPOREAL SHOCK WAVE LITHOTRIPSY (ESWL);  Surgeon: Festus Aloe, MD;  Location: [facility];  Service: Urology;  Laterality: Left;   LEFT HEART CATH AND CORONARY ANGIOGRAPHY N/A 06/22/2018   Procedure: LEFT HEART CATH AND CORONARY ANGIOGRAPHY;  Surgeon: Nigel Mormon, MD;  Location: [facility];  Service: Cardiovascular;  Laterality: N/A;   surgical debridement  Right    Right foot ulcer   TEE WITHOUT CARDIOVERSION N/A 06/25/2018   Procedure: TRANSESOPHAGEAL ECHOCARDIOGRAM (TEE);  Surgeon: Rexene Alberts, MD;  Location: [facility];  Service: Open Heart Surgery;   Laterality: N/A;   Social History   Occupational History   Occupation: attorney  Tobacco Use   Smoking status: Never   Smokeless tobacco: Never  Vaping Use   Vaping Use: Never used  Substance and Sexual Activity   Alcohol use: Yes    Alcohol/week: 2.0 standard drinks of alcohol    Types: 2 Cans of beer per week    Comment: Rare   Drug use: Never   Sexual activity: Not on file

## 2022-01-26 ENCOUNTER — Other Ambulatory Visit (HOSPITAL_BASED_OUTPATIENT_CLINIC_OR_DEPARTMENT_OTHER): Payer: Self-pay

## 2022-01-26 ENCOUNTER — Other Ambulatory Visit: Payer: Self-pay | Admitting: Orthopedic Surgery

## 2022-01-26 ENCOUNTER — Encounter: Payer: Self-pay | Admitting: Orthopedic Surgery

## 2022-01-26 MED ORDER — SULFAMETHOXAZOLE-TRIMETHOPRIM 800-160 MG PO TABS
1.0000 | ORAL_TABLET | Freq: Two times a day (BID) | ORAL | 0 refills | Status: DC
Start: 2022-01-26 — End: 2022-02-28
  Filled 2022-01-26: qty 40, 20d supply, fill #0

## 2022-01-29 ENCOUNTER — Other Ambulatory Visit (HOSPITAL_BASED_OUTPATIENT_CLINIC_OR_DEPARTMENT_OTHER): Payer: Self-pay

## 2022-02-01 ENCOUNTER — Ambulatory Visit (INDEPENDENT_AMBULATORY_CARE_PROVIDER_SITE_OTHER): Payer: 59 | Admitting: Orthopedic Surgery

## 2022-02-01 DIAGNOSIS — Z89432 Acquired absence of left foot: Secondary | ICD-10-CM

## 2022-02-12 ENCOUNTER — Other Ambulatory Visit (HOSPITAL_BASED_OUTPATIENT_CLINIC_OR_DEPARTMENT_OTHER): Payer: Self-pay

## 2022-02-12 ENCOUNTER — Ambulatory Visit (INDEPENDENT_AMBULATORY_CARE_PROVIDER_SITE_OTHER): Payer: 59 | Admitting: Medical

## 2022-02-12 VITALS — BP 118/74 | HR 91 | Resp 18 | Ht 74.5 in | Wt 234.0 lb

## 2022-02-12 DIAGNOSIS — R61 Generalized hyperhidrosis: Secondary | ICD-10-CM

## 2022-02-12 DIAGNOSIS — R5383 Other fatigue: Secondary | ICD-10-CM

## 2022-02-12 DIAGNOSIS — E118 Type 2 diabetes mellitus with unspecified complications: Secondary | ICD-10-CM

## 2022-02-12 DIAGNOSIS — L089 Local infection of the skin and subcutaneous tissue, unspecified: Secondary | ICD-10-CM | POA: Diagnosis not present

## 2022-02-12 LAB — CBC WITH DIFFERENTIAL/PLATELET
Basophils Absolute: 0.1 10*3/uL (ref 0.0–0.1)
Basophils Relative: 0.7 % (ref 0.0–3.0)
Eosinophils Absolute: 0.1 10*3/uL (ref 0.0–0.7)
Eosinophils Relative: 1.3 % (ref 0.0–5.0)
HCT: 36.4 % — ABNORMAL LOW (ref 39.0–52.0)
Hemoglobin: 12 g/dL — ABNORMAL LOW (ref 13.0–17.0)
Lymphocytes Relative: 23.5 % (ref 12.0–46.0)
Lymphs Abs: 2.1 10*3/uL (ref 0.7–4.0)
MCHC: 33.1 g/dL (ref 30.0–36.0)
MCV: 91.8 fl (ref 78.0–100.0)
Monocytes Absolute: 0.9 10*3/uL (ref 0.1–1.0)
Monocytes Relative: 9.5 % (ref 3.0–12.0)
Neutro Abs: 5.9 10*3/uL (ref 1.4–7.7)
Neutrophils Relative %: 65 % (ref 43.0–77.0)
Platelets: 325 10*3/uL (ref 150.0–400.0)
RBC: 3.97 Mil/uL — ABNORMAL LOW (ref 4.22–5.81)
RDW: 14.8 % (ref 11.5–15.5)
WBC: 9 10*3/uL (ref 4.0–10.5)

## 2022-02-12 LAB — SEDIMENTATION RATE: Sed Rate: 36 mm/hr — ABNORMAL HIGH (ref 0–20)

## 2022-02-12 LAB — COMPREHENSIVE METABOLIC PANEL
ALT: 12 U/L (ref 0–53)
AST: 12 U/L (ref 0–37)
Albumin: 3.8 g/dL (ref 3.5–5.2)
Alkaline Phosphatase: 143 U/L — ABNORMAL HIGH (ref 39–117)
BUN: 23 mg/dL (ref 6–23)
CO2: 27 mEq/L (ref 19–32)
Calcium: 9.3 mg/dL (ref 8.4–10.5)
Chloride: 101 mEq/L (ref 96–112)
Creatinine, Ser: 1.24 mg/dL (ref 0.40–1.50)
GFR: 63.63 mL/min (ref >60.00)
Glucose, Bld: 169 mg/dL — ABNORMAL HIGH (ref 70–99)
Potassium: 4.6 mEq/L (ref 3.5–5.1)
Sodium: 137 mEq/L (ref 135–145)
Total Bilirubin: 0.3 mg/dL (ref 0.2–1.2)
Total Protein: 7.3 g/dL (ref 6.0–8.3)

## 2022-02-12 LAB — IRON: Iron: 22 ug/dL — ABNORMAL LOW (ref 42–165)

## 2022-02-12 MED ORDER — LISINOPRIL 5 MG PO TABS
5.0000 mg | ORAL_TABLET | Freq: Every day | ORAL | 3 refills | Status: DC
  Filled 2022-02-12: qty 90, 90d supply, fill #0
  Filled 2022-05-18: qty 30, 30d supply, fill #1

## 2022-02-12 NOTE — Patient Instructions (Addendum)
For recent fatigue and sweating episodes will get cbc ,cmp, tsh, t4, sed rate, lactic acid, b12, b1 and iron level.  For diabetes continue metformin and follow up with Dr. Renne Crigler.  Do recommend that you check your blood sugar if feeling severe fatigue, sweating, light headed or near syncope. If sugars less than 70 then have snack to bring sugars back up.  Your bp is very tightly controlled. You are on hctz and lisinopril. Check bp daily. If you continue to see numbers less than 120 then can start lisinopril 5 mg daily  For hx of foot infection and amputation. Continue bactrim. Follow up with Dr. Sharol Given on Wednesday. Labs will be available for Dr. Sharol Given to review.  Discussed kidney stone prevention.   Follow up 3-4 weeks or sooner if needed

## 2022-02-12 NOTE — Progress Notes (Signed)
Subjective:    Patient ID: Joshua Branch, male    DOB: 1960-11-03, 61 y.o.   MRN: [id number]  HPI  Pt in state he has been feeling fatigued for months. He states Friday morning his staff at work states he looked very pale and off. He states on Friday he did not fee any near syncope. He had partial foot amputation back in Dec 22, 2021.   Pt has been on antibiotic intermittently since having the amputation.   Pt is currently on bactrim for past 15 days. He states was on for about 40 days. Took break off bacrtim for 10 days then restarted it just recently.    Review of Systems  Constitutional:  Negative for chills, fatigue and fever.  HENT:  Negative for congestion and ear pain.   Respiratory:  Negative for cough, chest tightness, shortness of breath and wheezing.   Cardiovascular:  Negative for chest pain and palpitations.  Gastrointestinal:  Negative for abdominal distention, abdominal pain, blood in stool, diarrhea, nausea and vomiting.  Genitourinary:  Negative for dysuria, flank pain and frequency.  Musculoskeletal:  Negative for back pain and gait problem.       Foot amputation left foot.   Skin:  Negative for rash.  Neurological:  Negative for facial asymmetry, weakness and light-headedness.  Psychiatric/Behavioral:  Negative for behavioral problems, confusion and self-injury. The patient is not nervous/anxious.    Past Medical History:  Diagnosis Date   Acute myocardial infarction ([place]) 06/22/2018   Allergy    Charcot's joint of left foot, non-diabetic    Coronary artery disease involving native coronary artery with unstable angina pectoris ([place]) 06/22/2018   Diabetes ([place])    type 2   GERD (gastroesophageal reflux disease)    History of kidney stones    passed   Hyperlipidemia    Hypertension    Neuropathy of left foot    Nodular basal cell carcinoma ([facility]CC) 07/10/2019   Right V of Neck (curet and 5FU)   Pneumonia    As a child   Right foot ulcer ([place])    S/P  CABG x 4 06/25/2018   LIMA to LAD, SVG to Diag, SVG to OM2, SVG to [name], EVH via right thigh and leg   Type II diabetes mellitus with complication, uncontrolled    Uncontrolled type 2 diabetes mellitus      Social History   Socioeconomic History   Marital status: Significant Other    Spouse name: [name]   Number of children: 2   Years of education: Not on file   Highest education level: Professional school degree (e.g., MD, DDS, DVM, JD)  Occupational History   Occupation: attorney  Tobacco Use   Smoking status: Never   Smokeless tobacco: Never  Vaping Use   Vaping Use: Never used  Substance and Sexual Activity   Alcohol use: Yes    Alcohol/week: 2.0 standard drinks of alcohol    Types: 2 Cans of beer per week    Comment: Rare   Drug use: Never   Sexual activity: Not on file  Other Topics Concern   Not on file  Social History Narrative   Not on file   Social Determinants of Health   Financial Resource Strain: Low Risk  (06/22/2018)   Overall Financial Resource Strain (CARDIA)    Difficulty of Paying Living Expenses: Not hard at all  Food Insecurity: Not on file  Transportation Needs: Not on file  Physical Activity: Not on file  Stress:  Not on file  Social Connections: Not on file  Intimate Partner Violence: Not on file    Past Surgical History:  Procedure Laterality Date   AMPUTATION Right 01/06/2020   Procedure: RIGHT FOOT 2ND RAY AMPUTATION;  Surgeon: Newt Minion, MD;  Location: [facility];  Service: Orthopedics;  Laterality: Right;   AMPUTATION Left 01/11/2021   Procedure: LEFT FOOT 3RD RAY AMPUTATION;  Surgeon: Newt Minion, MD;  Location: [facility];  Service: Orthopedics;  Laterality: Left;   AMPUTATION Left 12/22/2021   Procedure: LEFT TRANSMETATARSAL AMPUTATION;  Surgeon: Newt Minion, MD;  Location: [facility];  Service: Orthopedics;  Laterality: Left;   APPLICATION OF WOUND VAC  12/22/2021   Procedure: APPLICATION OF WOUND VAC;  Surgeon: Newt Minion, MD;   Location: [facility];  Service: Orthopedics;;   COLONOSCOPY W/ POLYPECTOMY     CORONARY ARTERY BYPASS GRAFT N/A 06/25/2018   Procedure: CORONARY ARTERY BYPASS GRAFTING (CABG) TIMES FOUR USING LEFT INTERNAL MAMMARY ARTERY AND RIGHT ENDOSCOPICALLY HARVESTED SAPHENOUS VEIN;  Surgeon: Rexene Alberts, MD;  Location: [facility];  Service: Open Heart Surgery;  Laterality: N/A;   CYSTOSCOPY WITH STENT PLACEMENT Left 12/04/2021   Procedure: CYSTOSCOPY/RETROGRADE/ STENT PLACEMENT;  Surgeon: Vira Agar, MD;  [facility];  Service: Urology;  Laterality: Left;   CYSTOSCOPY/URETEROSCOPY/HOLMIUM LASER/STENT PLACEMENT Left 01/05/2022   Procedure: CYSTOSCOPY LEFT URETERAL STENT REMOVAL  LEFT RETROGRADE PYELOGRAM URETEROSCOPY BASKET STONE EXTRACTION STENT PLACEMENT;  Surgeon: Lucas Mallow, MD;  [facility];  Service: Urology;  Laterality: Left;  1 HR FOR CASE   ENDOVEIN HARVEST OF GREATER SAPHENOUS VEIN Right 06/25/2018   Procedure: ENDOVEIN HARVEST OF GREATER SAPHENOUS VEIN;  Surgeon: Rexene Alberts, MD;  Location: [facility];  Service: Open Heart Surgery;  Laterality: Right;   EXTRACORPOREAL SHOCK WAVE LITHOTRIPSY Left 12/07/2021   Procedure: EXTRACORPOREAL SHOCK WAVE LITHOTRIPSY (ESWL);  Surgeon: Festus Aloe, MD;  Location: [facility];  Service: Urology;  Laterality: Left;   LEFT HEART CATH AND CORONARY ANGIOGRAPHY N/A 06/22/2018   Procedure: LEFT HEART CATH AND CORONARY ANGIOGRAPHY;  Surgeon: Nigel Mormon, MD;  Location: [facility];  Service: Cardiovascular;  Laterality: N/A;   surgical debridement  Right    Right foot ulcer   TEE WITHOUT CARDIOVERSION N/A 06/25/2018   Procedure: TRANSESOPHAGEAL ECHOCARDIOGRAM (TEE);  Surgeon: Rexene Alberts, MD;  Location: [facility];  Service: Open Heart Surgery;  Laterality: N/A;    Family History  Problem Relation Age of Onset   Diabetes Mother    Stroke Father    Hypertension Father    Hearing loss Father    Colon polyps  Father    Colon cancer Neg Hx    Esophageal cancer Neg Hx    Prostate cancer Neg Hx     Allergies  Allergen Reactions   Apricot Kernel Oil [Prunus] Swelling    THROAT   Cherry Swelling    THROAT   Jardiance [Empagliflozin] Diarrhea and Nausea And Vomiting   Other Other (See Comments)    Fruits with a pit. Throat swells up. Can have them if cooked    Peach [Prunus Persica] Swelling    THROAT   Plum Pulp Swelling    THROAT    Current Outpatient Medications on File Prior to Visit  Medication Sig Dispense Refill   atorvastatin (LIPITOR) 80 MG tablet Take 1 tablet (80 mg total) by mouth daily. (Patient taking differently: Take 80 mg by mouth every evening.) 90  tablet 3   Coenzyme Q10 (CO Q-10) 300 MG CAPS Take 300 mg by mouth in the morning.     glipiZIDE (GLUCOTROL XL) 5 MG 24 hr tablet Take 1 tablet (5 mg total) by mouth daily before breakfast. 90 tablet 3   glucose blood (FREESTYLE LITE) test strip USE AS INSTRUCTED TO CHECK 1-2X A DAY 100 strip 1   hydrochlorothiazide (MICROZIDE) 12.5 MG capsule TAKE 1 CAPSULE (12.5 MG TOTAL) BY MOUTH DAILY. 90 capsule 1   Lancets MISC Check sugars 3 times a day E11.9 100 each 0   lisinopril (ZESTRIL) 10 MG tablet TAKE 1 TABLET (10 MG TOTAL) BY MOUTH DAILY. 90 tablet 0   metFORMIN (GLUCOPHAGE) 500 MG tablet Take 3 tablets by mouth daily as advised (Patient taking differently: Take 500 mg by mouth in the morning and at bedtime.) 270 tablet 3   metoprolol tartrate (LOPRESSOR) 25 MG tablet TAKE 1 TABLET (25 MG TOTAL) BY MOUTH 2 (TWO) TIMES DAILY. 180 tablet 3   Multiple Vitamin (MULTIVITAMIN WITH MINERALS) TABS tablet Take 1 tablet by mouth every evening.     pentoxifylline (TRENTAL) 400 MG CR tablet Take 1 tablet (400 mg total) by mouth 3 (three) times daily with meals. 90 tablet 3   sulfamethoxazole-trimethoprim (BACTRIM DS) 800-160 MG tablet Take 1 tablet by mouth 2 (two) times daily. 40 tablet 0   Testosterone 30 MG/ACT SOLN Apply 2 pumps on to the  skin once daily (Patient taking differently: Apply 2 pumps onto the skin daily to arm pits) 90 mL 1   vitamin B-12 (CYANOCOBALAMIN) 1000 MCG tablet Take 1,000 mcg by mouth in the morning.     No current facility-administered medications on file prior to visit.    BP 118/74   Pulse 91   Resp 18   Ht 6' 2.5" (1.892 m)   Wt 234 lb (106.1 kg)   SpO2 94%   BMI 29.64 kg/m        Objective:   Physical Exam  General Mental Status- Alert. General Appearance- Not in acute distress.   Chest and Lung Exam Auscultation: Breath Sounds:-Normal.  Cardiovascular Auscultation:Rythm- Regular. Murmurs & Other Heart Sounds:Auscultation of the heart reveals- No Murmurs.  Abdomen Inspection:-Inspeection Normal. Palpation/Percussion:Note:No mass. Palpation and Percussion of the abdomen reveal- Non Tender, Non Distended + BS, no rebound or guarding.   Neurologic Cranial Nerve exam:- CN III-XII intact(No nystagmus), symmetric smile. Strength:- 5/5 equal and symmetric strength both upper and lower extremities.    Left foot- he is in boot now.     Assessment & Plan:   Patient Instructions  For recent fatigue and sweating episodes will get cbc ,cmp, tsh, t4, sed rate, lactic acid, b12, b1 and iron level.  For diabetes continue metformin and follow up with Dr. Renne Crigler.  Do recommend that you check your blood sugar if feeling severe fatigue, sweating, light headed or near syncope. If sugars less than 70 then have snack to bring sugars back up.  Your bp is very tightly controlled. You are on hctz and lisinopril. Check bp daily. If you continue to see numbers less than 120 then can start lisinopril 5 mg daily  For hx of foot infection and amputation. Continue bactrim. Follow up with Dr. Sharol Given on Wednesday. Labs will be available for Dr. Sharol Given to review.  Discussed kidney stone prevention.   Follow up 3-4 weeks or sooner if needed       [employer], PA-C  Mackie Pai, PA-C   Time  spent with patient today was 43  minutes which consisted of chart revdiew, discussing diagnosis, work up treatment and documentation.

## 2022-02-13 LAB — T4, FREE: Free T4: 0.67 ng/dL (ref 0.60–1.60)

## 2022-02-13 LAB — TSH: TSH: 1.69 u[IU]/mL (ref 0.35–5.50)

## 2022-02-13 LAB — VITAMIN B12: Vitamin B-12: 965 pg/mL — ABNORMAL HIGH (ref 211–911)

## 2022-02-13 MED ORDER — IRON (FERROUS SULFATE) 325 (65 FE) MG PO TABS
325.0000 mg | ORAL_TABLET | Freq: Every day | ORAL | 3 refills | Status: DC
  Filled 2022-02-13: qty 100, 100d supply, fill #0
  Filled 2022-03-09: qty 100, 100d supply, fill #1
  Filled 2022-05-18: qty 100, 100d supply, fill #2
  Filled 2022-11-12 – 2022-11-20 (×2): qty 100, 100d supply, fill #3

## 2022-02-13 NOTE — Addendum Note (Signed)
Addended by: Anabel Halon on: 02/13/2022 08:47 PM   Modules accepted: Orders

## 2022-02-14 ENCOUNTER — Other Ambulatory Visit (HOSPITAL_BASED_OUTPATIENT_CLINIC_OR_DEPARTMENT_OTHER): Payer: Self-pay

## 2022-02-14 ENCOUNTER — Ambulatory Visit (INDEPENDENT_AMBULATORY_CARE_PROVIDER_SITE_OTHER): Payer: 59 | Admitting: Family

## 2022-02-14 ENCOUNTER — Encounter: Payer: Self-pay | Admitting: Family

## 2022-02-14 ENCOUNTER — Ambulatory Visit: Payer: Self-pay

## 2022-02-14 ENCOUNTER — Ambulatory Visit (INDEPENDENT_AMBULATORY_CARE_PROVIDER_SITE_OTHER): Payer: 59

## 2022-02-14 DIAGNOSIS — Z89432 Acquired absence of left foot: Secondary | ICD-10-CM

## 2022-02-14 LAB — LACTIC ACID, PLASMA: LACTIC ACID: 2.4 mmol/L — ABNORMAL HIGH (ref 0.4–1.8)

## 2022-02-14 MED ORDER — CLINDAMYCIN HCL 300 MG PO CAPS
300.0000 mg | ORAL_CAPSULE | Freq: Three times a day (TID) | ORAL | 0 refills | Status: DC
  Filled 2022-02-14: qty 40, 14d supply, fill #0

## 2022-02-14 NOTE — Progress Notes (Signed)
Office Visit Note   [name]           Date of Birth: [birth year]           MRN: [id number] Visit Date: 02/14/2022              Requested by: Mackie Pai, PA-C [place],  [place] [address] PCP: Mackie Pai, PA-C  Chief Complaint  Patient presents with   Left Foot - Routine Post Op    12/22/21 left transmet amputation      HPI: The patient is a 61 year old gentleman who presents status post left transmetatarsal amputation May 26 he has had significant breakdown of his incision.  He presents today concerned for possible worsening and possible infection.  He finished his Bactrim this morning completed a 20-day course.  Reports that last Friday he had not been feeling well was feeling fatigued like he was going to pass out overall feeling poorly did see his primary care provider they stopped his antihypertensives as his blood pressure was quite low he was sent to [place] for concern of infection as well.  Does report that today he has not been feeling poorly has not been feeling poorly since Friday  He is nonweightbearing with a kneeling scooter using dry dressings reports that he occasionally wears his medical compression stocking as well he is using Trental  Assessment & Plan: Visit Diagnoses:  1. History of transmetatarsal amputation of left foot ([place])     Plan: We will place him on a course of clindamycin concern for ongoing infection in the foot, however radiographs today were not equivocal.  He will continue with daily Dial soap cleansing and dry dressings.  Discussed return precautions.  Follow-Up Instructions: No follow-ups on file.   Ortho Exam  Patient is alert, oriented, no adenopathy, well-dressed, normal affect, normal respiratory effort. On examination of the left foot there is mild erythema with mild warmth.  There is significant dehiscence with 20% fibrinous exudative tissue and pink tissue in the wound bed.  Please see  attached photo.  There is serous drainage.  This does not probe to bone or tendon. there is no purulence no foul odor  Imaging: No results found.   Labs: Lab Results  Component Value Date   HGBA1C 6.2 (H) 01/04/2022   HGBA1C 7.3 (A) 06/14/2021   HGBA1C 7.4 (A) 01/27/2021   ESRSEDRATE 36 (H) 02/12/2022   REPTSTATUS 12/27/2021 FINAL 12/22/2021   GRAMSTAIN  12/22/2021    RARE WBC PRESENT,BOTH PMN AND MONONUCLEAR NO ORGANISMS SEEN    CULT  12/22/2021    RARE METHICILLIN RESISTANT STAPHYLOCOCCUS AUREUS NO ANAEROBES ISOLATED Performed at [facility], [place] [address], [place], [place] [address]    LABORGA METHICILLIN RESISTANT STAPHYLOCOCCUS AUREUS 12/22/2021     Lab Results  Component Value Date   ALBUMIN 3.8 02/12/2022   ALBUMIN 4.3 05/01/2021   ALBUMIN 3.9 05/08/2019    Lab Results  Component Value Date   MG 2.1 05/08/2019   MG 2.2 06/26/2018   MG 3.0 (H) 06/26/2018   No results found for: "VD25OH"  No results found for: "PREALBUMIN"    Latest Ref Rng & Units 02/12/2022    3:04 PM 12/22/2021    5:56 AM 12/01/2021    3:48 PM  CBC EXTENDED  WBC 4.0 - 10.5 K/uL 9.0  7.7  10.6   RBC 4.22 - 5.81 Mil/uL 3.97  3.76  4.27   Hemoglobin 13.0 -  17.0 g/dL 12.0  11.9  13.6   HCT 39.0 - 52.0 % 36.4  34.8  38.5   Platelets 150.0 - 400.0 K/uL 325.0  256  405   NEUT# 1.4 - 7.7 K/uL 5.9   7,780   Lymph# 0.7 - 4.0 K/uL 2.1   1,866      There is no height or weight on file to calculate BMI.  Orders:  Orders Placed This Encounter  Procedures   XR Foot Complete Right   No orders of the defined types were placed in this encounter.    Procedures: No procedures performed  Clinical Data: No additional findings.  ROS:  All other systems negative, except as noted in the HPI. Review of Systems  Constitutional: Negative.   Skin:  Positive for color change and wound.  Neurological:  Negative for weakness.    Objective: Vital Signs: There were no vitals taken for  this visit.  Specialty Comments:  No specialty comments available.  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 ([place]) 01/18/2021   Osteomyelitis of third toe of left foot ([name]    Cutaneous abscess of left foot    Osteomyelitis of second toe of right foot ([place])    Poorly controlled type 2 diabetes mellitus with circulatory disorder ([place]) 07/15/2018   Numbness in both hands 07/15/2018   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   Acute coronary syndrome ([place]) 06/22/2018   Nonhealing skin ulcer ([place]) 06/22/2018   Coronary artery disease involving native coronary artery with unstable angina pectoris ([place]) 06/22/2018   Acute myocardial infarction ([place]) 06/22/2018   Hyperlipidemia    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   Vertigo 08/20/2016   Myringotomy tube status 08/20/2016   Ear fullness, left 08/20/2016   Asymmetrical left sensorineural hearing loss 02/06/2016   Neuropathy of left foot 02/03/2015   Charcot's joint of left foot, non-diabetic 02/03/2015   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   Cramping of hands 06/02/2014   Eustachian tube dysfunction 06/02/2014   HTN (hypertension) 05/07/2014   Obstructive sleep apnea 05/07/2014   Past Medical History:  Diagnosis Date   Acute myocardial infarction ([place]) 06/22/2018   Allergy    Charcot's joint of left foot, non-diabetic    Coronary artery disease involving native coronary artery with unstable angina pectoris ([place]) 06/22/2018   Diabetes ([place])    type 2   GERD (gastroesophageal reflux disease)    History of kidney stones    passed   Hyperlipidemia    Hypertension    Neuropathy of left foot    Nodular basal cell carcinoma (B[facility]) 07/10/2019   Right V of Neck (curet and 5FU)   Pneumonia    As a child   Right foot ulcer ([place])    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   LIMA to LAD, SVG to Diag, SVG to OM2, SVG to [name], EVH via right thigh and leg   Type II  diabetes mellitus with complication, uncontrolled    Uncontrolled type 2 diabetes mellitus     Family History  Problem Relation Age of Onset   Diabetes Mother    Stroke Father    Hypertension Father    Hearing loss Father    Colon polyps Father    Colon cancer Neg Hx    Esophageal cancer Neg Hx    Prostate cancer Neg Hx     Past Surgical History:  Procedure Laterality Date   AMPUTATION Right 01/06/2020   Procedure: RIGHT FOOT 2ND RAY AMPUTATION;  Surgeon: Newt Minion, MD;  Location: [facility];  Service: Orthopedics;  Laterality: Right;   AMPUTATION Left 01/11/2021   Procedure: LEFT FOOT 3RD RAY AMPUTATION;  Surgeon: Newt Minion, MD;  Location: [facility];  Service: Orthopedics;  Laterality: Left;   AMPUTATION Left 12/22/2021   Procedure: LEFT TRANSMETATARSAL AMPUTATION;  Surgeon: Newt Minion, MD;  Location: [facility];  Service: Orthopedics;  Laterality: Left;   APPLICATION OF WOUND VAC  12/22/2021   Procedure: APPLICATION OF WOUND VAC;  Surgeon: Newt Minion, MD;  Location: [facility];  Service: Orthopedics;;   COLONOSCOPY W/ POLYPECTOMY     CORONARY ARTERY BYPASS GRAFT N/A 06/25/2018   Procedure: CORONARY ARTERY BYPASS GRAFTING (CABG) TIMES FOUR USING LEFT INTERNAL MAMMARY ARTERY AND RIGHT ENDOSCOPICALLY HARVESTED SAPHENOUS VEIN;  Surgeon: Rexene Alberts, MD;  Location: [facility];  Service: Open Heart Surgery;  Laterality: N/A;   CYSTOSCOPY WITH STENT PLACEMENT Left 12/04/2021   Procedure: CYSTOSCOPY/RETROGRADE/ STENT PLACEMENT;  Surgeon: Vira Agar, MD;  Location[facility];  Service: Urology;  Laterality: Left;   CYSTOSCOPY/URETEROSCOPY/HOLMIUM LASER/STENT PLACEMENT Left 01/05/2022   Procedure: CYSTOSCOPY LEFT URETERAL STENT REMOVAL  LEFT RETROGRADE PYELOGRAM URETEROSCOPY BASKET STONE EXTRACTION STENT PLACEMENT;  Surgeon: Lucas Mallow, MD;  [facility];  Service: Urology;  Laterality: Left;  1 HR FOR CASE   ENDOVEIN HARVEST OF GREATER SAPHENOUS VEIN Right 06/25/2018   Procedure:  ENDOVEIN HARVEST OF GREATER SAPHENOUS VEIN;  Surgeon: Rexene Alberts, MD;  Location: [facility];  Service: Open Heart Surgery;  Laterality: Right;   EXTRACORPOREAL SHOCK WAVE LITHOTRIPSY Left 12/07/2021   Procedure: EXTRACORPOREAL SHOCK WAVE LITHOTRIPSY (ESWL);  Surgeon: Festus Aloe, MD;  Location: [facility];  Service: Urology;  Laterality: Left;   LEFT HEART CATH AND CORONARY ANGIOGRAPHY N/A 06/22/2018   Procedure: LEFT HEART CATH AND CORONARY ANGIOGRAPHY;  Surgeon: Nigel Mormon, MD;  Location: [facility];  Service: Cardiovascular;  Laterality: N/A;   surgical debridement  Right    Right foot ulcer   TEE WITHOUT CARDIOVERSION N/A 06/25/2018   Procedure: TRANSESOPHAGEAL ECHOCARDIOGRAM (TEE);  Surgeon: Rexene Alberts, MD;  Location: [facility];  Service: Open Heart Surgery;  Laterality: N/A;   Social History   Occupational History   Occupation: attorney  Tobacco Use   Smoking status: Never   Smokeless tobacco: Never  Vaping Use   Vaping Use: Never used  Substance and Sexual Activity   Alcohol use: Yes    Alcohol/week: 2.0 standard drinks of alcohol    Types: 2 Cans of beer per week    Comment: Rare   Drug use: Never   Sexual activity: Not on file

## 2022-02-15 LAB — VITAMIN B1: Vitamin B1 (Thiamine): 49 nmol/L — ABNORMAL HIGH (ref 8–30)

## 2022-02-16 ENCOUNTER — Encounter: Payer: Self-pay | Admitting: Medical

## 2022-02-17 ENCOUNTER — Telehealth: Payer: Self-pay | Admitting: Medical

## 2022-02-17 NOTE — Telephone Encounter (Signed)
Pt lactic acid was elevated. I had mentioned/advised on my chart message to go to ED for further evaluation. On review did not see that he went will you call pt on Monday and see how he is doing?

## 2022-02-19 NOTE — Telephone Encounter (Signed)
Patient stated he didn't go to the ED because he spoke with his son's friend who is ED doctor and told him to just visit if sx got worse but pt stated he is doing ok

## 2022-02-20 ENCOUNTER — Encounter: Payer: Self-pay | Admitting: Orthopedic Surgery

## 2022-02-20 NOTE — Progress Notes (Signed)
Office Visit Note   [name]           Date of Birth: [birth year]           MRN: [id number] Visit Date: 02/01/2022              Requested by: Mackie Pai, PA-C [place],  [place] [address] PCP: Mackie Pai, PA-C  Chief Complaint  Patient presents with   Left Foot - Routine Post Op    12/22/2021 left transmet amputation with Kerecis 3 x 7 sheet      HPI: Patient is 6 weeks status post left transmetatarsal amputation and application of Kerecis graft.  To facilitate wound healing patient has been on Trental nitroglycerin patch compression socks nonweightbearing and has been on Bactrim DS.  Assessment & Plan: Visit Diagnoses:  1. History of transmetatarsal amputation of left foot ([place])     Plan: Wound was debrided continue wound care protected weightbearing reevaluate in 2 weeks  Follow-Up Instructions: Return in about 2 weeks (around 02/15/2022).   Ortho Exam  Patient is alert, oriented, no adenopathy, well-dressed, normal affect, normal respiratory effort. Examination the wound is 6 x 8 cm after debridement.  There is hypergranulation tissue.  No ascending cellulitis.  Imaging: No results found.    Labs: Lab Results  Component Value Date   HGBA1C 6.2 (H) 01/04/2022   HGBA1C 7.3 (A) 06/14/2021   HGBA1C 7.4 (A) 01/27/2021   ESRSEDRATE 36 (H) 02/12/2022   REPTSTATUS 12/27/2021 FINAL 12/22/2021   GRAMSTAIN  12/22/2021    RARE WBC PRESENT,BOTH PMN AND MONONUCLEAR NO ORGANISMS SEEN    CULT  12/22/2021    RARE METHICILLIN RESISTANT STAPHYLOCOCCUS AUREUS NO ANAEROBES ISOLATED Performed at [facility], [place] [address], [place], [place] [address]    LABORGA METHICILLIN RESISTANT STAPHYLOCOCCUS AUREUS 12/22/2021     Lab Results  Component Value Date   ALBUMIN 3.8 02/12/2022   ALBUMIN 4.3 05/01/2021   ALBUMIN 3.9 05/08/2019    Lab Results  Component Value Date   MG 2.1 05/08/2019   MG 2.2 06/26/2018   MG  3.0 (H) 06/26/2018   No results found for: "VD25OH"  No results found for: "PREALBUMIN"    Latest Ref Rng & Units 02/12/2022    3:04 PM 12/22/2021    5:56 AM 12/01/2021    3:48 PM  CBC EXTENDED  WBC 4.0 - 10.5 K/uL 9.0  7.7  10.6   RBC 4.22 - 5.81 Mil/uL 3.97  3.76  4.27   Hemoglobin 13.0 - 17.0 g/dL 12.0  11.9  13.6   HCT 39.0 - 52.0 % 36.4  34.8  38.5   Platelets 150.0 - 400.0 K/uL 325.0  256  405   NEUT# 1.4 - 7.7 K/uL 5.9   7,780   Lymph# 0.7 - 4.0 K/uL 2.1   1,866      There is no height or weight on file to calculate BMI.  Orders:  No orders of the defined types were placed in this encounter.  No orders of the defined types were placed in this encounter.    Procedures: No procedures performed  Clinical Data: No additional findings.  ROS:  All other systems negative, except as noted in the HPI. Review of Systems  Objective: Vital Signs: There were no vitals taken for this visit.  Specialty Comments:  No specialty comments available.  PMFS History: Patient Active Problem List   Diagnosis Date Noted  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 ([place]) 01/18/2021   Osteomyelitis of third toe of left foot ([place])    Cutaneous abscess of left foot    Osteomyelitis of second toe of right foot ([place])    Poorly controlled type 2 diabetes mellitus with circulatory disorder ([place]) 07/15/2018   Numbness in both hands 07/15/2018   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   Acute coronary syndrome ([place]) 06/22/2018   Nonhealing skin ulcer ([place]) 06/22/2018   Coronary artery disease involving native coronary artery with unstable angina pectoris ([place]) 06/22/2018   Acute myocardial infarction ([place]) 06/22/2018   Hyperlipidemia    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   Vertigo 08/20/2016   Myringotomy tube status 08/20/2016   Ear fullness, left 08/20/2016   Asymmetrical left sensorineural hearing loss 02/06/2016   Neuropathy of left foot 02/03/2015    Charcot's joint of left foot, non-diabetic 02/03/2015   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   Cramping of hands 06/02/2014   Eustachian tube dysfunction 06/02/2014   HTN (hypertension) 05/07/2014   Obstructive sleep apnea 05/07/2014   Past Medical History:  Diagnosis Date   Acute myocardial infarction ([place]) 06/22/2018   Allergy    Charcot's joint of left foot, non-diabetic    Coronary artery disease involving native coronary artery with unstable angina pectoris ([place]) 06/22/2018   Diabetes ([place])    type 2   GERD (gastroesophageal reflux disease)    History of kidney stones    passed   Hyperlipidemia    Hypertension    Neuropathy of left foot    Nodular basal cell carcinoma (BCC) 07/10/2019   Right V of Neck (curet and 5FU)   Pneumonia    As a child   Right foot ulcer ([place])    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   LIMA to LAD, SVG to Diag, SVG to OM2, SVG to [name], EVH via right thigh and leg   Type II diabetes mellitus with complication, uncontrolled    Uncontrolled type 2 diabetes mellitus     Family History  Problem Relation Age of Onset   Diabetes Mother    Stroke Father    Hypertension Father    Hearing loss Father    Colon polyps Father    Colon cancer Neg Hx    Esophageal cancer Neg Hx    Prostate cancer Neg Hx     Past Surgical History:  Procedure Laterality Date   AMPUTATION Right 01/06/2020   Procedure: RIGHT FOOT 2ND RAY AMPUTATION;  Surgeon: Newt Minion, MD;  Location: [facility];  Service: Orthopedics;  Laterality: Right;   AMPUTATION Left 01/11/2021   Procedure: LEFT FOOT 3RD RAY AMPUTATION;  Surgeon: Newt Minion, MD;  Location: [facility];  Service: Orthopedics;  Laterality: Left;   AMPUTATION Left 12/22/2021   Procedure: LEFT TRANSMETATARSAL AMPUTATION;  Surgeon: Newt Minion, MD;  Location: [facility];  Service: Orthopedics;  Laterality: Left;   APPLICATION OF WOUND VAC  12/22/2021   Procedure: APPLICATION OF WOUND VAC;  Surgeon: Newt Minion, MD;  Location: [facility];  Service:  Orthopedics;;   COLONOSCOPY W/ POLYPECTOMY     CORONARY ARTERY BYPASS GRAFT N/A 06/25/2018   Procedure: CORONARY ARTERY BYPASS GRAFTING (CABG) TIMES FOUR USING LEFT INTERNAL MAMMARY ARTERY AND RIGHT ENDOSCOPICALLY HARVESTED SAPHENOUS VEIN;  Surgeon: Rexene Alberts, MD;  Location: [facility];  Service: Open Heart Surgery;  Laterality: N/A;   CYSTOSCOPY WITH STENT PLACEMENT Left 12/04/2021   Procedure:  CYSTOSCOPY/RETROGRADE/ STENT PLACEMENT;  Surgeon: Vira Agar, MD;  [facility];  Service: Urology;  Laterality: Left;   CYSTOSCOPY/URETEROSCOPY/HOLMIUM LASER/STENT PLACEMENT Left 01/05/2022   Procedure: CYSTOSCOPY LEFT URETERAL STENT REMOVAL  LEFT RETROGRADE PYELOGRAM URETEROSCOPY BASKET STONE EXTRACTION STENT PLACEMENT;  Surgeon: Lucas Mallow, MD;  [facility];  Service: Urology;  Laterality: Left;  1 HR FOR CASE   ENDOVEIN HARVEST OF GREATER SAPHENOUS VEIN Right 06/25/2018   Procedure: ENDOVEIN HARVEST OF GREATER SAPHENOUS VEIN;  Surgeon: Rexene Alberts, MD;  Location: [facility];  Service: Open Heart Surgery;  Laterality: Right;   EXTRACORPOREAL SHOCK WAVE LITHOTRIPSY Left 12/07/2021   Procedure: EXTRACORPOREAL SHOCK WAVE LITHOTRIPSY (ESWL);  Surgeon: Festus Aloe, MD;  Location: [facility];  Service: Urology;  Laterality: Left;   LEFT HEART CATH AND CORONARY ANGIOGRAPHY N/A 06/22/2018   Procedure: LEFT HEART CATH AND CORONARY ANGIOGRAPHY;  Surgeon: Nigel Mormon, MD;  Location: [facility];  Service: Cardiovascular;  Laterality: N/A;   surgical debridement  Right    Right foot ulcer   TEE WITHOUT CARDIOVERSION N/A 06/25/2018   Procedure: TRANSESOPHAGEAL ECHOCARDIOGRAM (TEE);  Surgeon: Rexene Alberts, MD;  Location: [facility];  Service: Open Heart Surgery;  Laterality: N/A;   Social History   Occupational History   Occupation: attorney  Tobacco Use   Smoking status: Never   Smokeless tobacco: Never  Vaping Use   Vaping Use: Never used  Substance  and Sexual Activity   Alcohol use: Yes    Alcohol/week: 2.0 standard drinks of alcohol    Types: 2 Cans of beer per week    Comment: Rare   Drug use: Never   Sexual activity: Not on file

## 2022-02-20 NOTE — Telephone Encounter (Signed)
Pt called and he stated he would call back if he wanted to make an appt

## 2022-02-26 ENCOUNTER — Other Ambulatory Visit: Payer: Self-pay | Admitting: Medical

## 2022-02-26 ENCOUNTER — Encounter (HOSPITAL_COMMUNITY): Payer: Self-pay | Admitting: Orthopedic Surgery

## 2022-02-26 ENCOUNTER — Ambulatory Visit (INDEPENDENT_AMBULATORY_CARE_PROVIDER_SITE_OTHER): Payer: 59 | Admitting: Orthopedic Surgery

## 2022-02-26 ENCOUNTER — Other Ambulatory Visit: Payer: Self-pay

## 2022-02-26 ENCOUNTER — Encounter: Payer: Self-pay | Admitting: Orthopedic Surgery

## 2022-02-26 ENCOUNTER — Other Ambulatory Visit (HOSPITAL_COMMUNITY): Payer: Self-pay

## 2022-02-26 DIAGNOSIS — Z89432 Acquired absence of left foot: Secondary | ICD-10-CM

## 2022-02-26 DIAGNOSIS — L97521 Non-pressure chronic ulcer of other part of left foot limited to breakdown of skin: Secondary | ICD-10-CM

## 2022-02-26 MED ORDER — ALPHA-LIPOIC ACID 600 MG PO CAPS
600.0000 mg | ORAL_CAPSULE | Freq: Two times a day (BID) | ORAL | 2 refills | Status: AC
  Filled 2022-02-26 – 2022-03-08 (×3): qty 60, 30d supply, fill #0

## 2022-02-26 MED ORDER — NITROGLYCERIN 0.2 MG/HR TD PT24
0.2000 mg | MEDICATED_PATCH | Freq: Every day | TRANSDERMAL | 2 refills | Status: DC
  Filled 2022-02-26: qty 30, 30d supply, fill #0
  Filled 2022-04-01: qty 30, 30d supply, fill #1
  Filled 2022-05-18: qty 30, 30d supply, fill #2

## 2022-02-26 MED ORDER — ASPIRIN 81 MG PO TBEC
81.0000 mg | DELAYED_RELEASE_TABLET | Freq: Every day | ORAL | 2 refills | Status: DC
  Filled 2022-02-26: qty 30, 30d supply, fill #0
  Filled 2022-05-18: qty 30, 30d supply, fill #1
  Filled 2022-06-22: qty 30, 30d supply, fill #2

## 2022-02-26 NOTE — Progress Notes (Signed)
Spoke with pt for pre-op call. Pt has hx of CAD with CABG in 2019. Pt is a type 2 diabetic. Last A1C was 6.2 on 01/04/22. Instructed pt not to take Glipizide or Metformin the morning of surgery. Instructed pt to check his blood sugar when he gets up the morning of surgery and every 2 hours until he leaves for the hospital. If blood sugar is 70 or below, treat with 1/2 cup of clear juice (apple or cranberry) and recheck blood sugar 15 minutes after drinking juice. If blood sugar continues to be 70 or below, call the Short Stay department and ask to speak to a nurse. Pt voiced understanding.   Shower instructions given to pt.

## 2022-02-26 NOTE — Progress Notes (Signed)
Office Visit Note   [name]           Date of Birth: Jul 29, 1961           MRN: [id number] Visit Date: 02/26/2022              Requested by: Mackie Pai, PA-C [place],  [place] [id number] PCP: Mackie Pai, PA-C  Chief Complaint  Patient presents with   Left Foot - Follow-up    Transmet amputation 12/22/2021      HPI: Patient is a 61 year old gentleman is 2 months status post left transmetatarsal amputation.  Patient has had wound dehiscence and breakdown and is currently completing a course of clindamycin as well as using nitroglycerin patches.  Patient states he developed a rash on his chest and leg and arms he states he is not changed any medication.  He states that this may be due to a nut mix that he just ate.  Assessment & Plan: Visit Diagnoses:  1. History of transmetatarsal amputation of left foot ([name])   2. Non-pressure chronic ulcer of other part of left foot limited to breakdown of skin ([name])     Plan: The wound bed is stable feel like it would be appropriate to proceed with tissue graft at this time.  Plan for Kerecis tissue graft on Wednesday as an outpatient with placement of a wound VAC.  Recommended Benadryl for the rash.  Recommended not using prednisone due to its potential for wound healing problems.  Follow-Up Instructions: Return in about 1 week (around 03/05/2022).   Ortho Exam  Patient is alert, oriented, no adenopathy, well-dressed, normal affect, normal respiratory effort. Examination there is no cellulitis or dermatitis the color has improved in the left foot.  The ulcer measures 7 x 10 cm.  The ulcer does not probe to bone or tendon there is no drainage no signs of any deep bone infection.  Imaging: No results found. No images are attached to the encounter.  Labs: Lab Results  Component Value Date   HGBA1C 6.2 (H) 01/04/2022   HGBA1C 7.3 (A) 06/14/2021   HGBA1C 7.4 (A) 01/27/2021   ESRSEDRATE 36  (H) 02/12/2022   REPTSTATUS 12/27/2021 FINAL 12/22/2021   GRAMSTAIN  12/22/2021    RARE WBC PRESENT,BOTH PMN AND MONONUCLEAR NO ORGANISMS SEEN    CULT  12/22/2021    RARE METHICILLIN RESISTANT STAPHYLOCOCCUS AUREUS NO ANAEROBES ISOLATED Performed at [facility], [place] [address], [place], [place] [address]    LABORGA METHICILLIN RESISTANT STAPHYLOCOCCUS AUREUS 12/22/2021     Lab Results  Component Value Date   ALBUMIN 3.8 02/12/2022   ALBUMIN 4.3 05/01/2021   ALBUMIN 3.9 05/08/2019    Lab Results  Component Value Date   MG 2.1 05/08/2019   MG 2.2 06/26/2018   MG 3.0 (H) 06/26/2018   No results found for: "VD25OH"  No results found for: "PREALBUMIN"    Latest Ref Rng & Units 02/12/2022    3:04 PM 12/22/2021    5:56 AM 12/01/2021    3:48 PM  CBC EXTENDED  WBC 4.0 - 10.5 K/uL 9.0  7.7  10.6   RBC 4.22 - 5.81 Mil/uL 3.97  3.76  4.27   Hemoglobin 13.0 - 17.0 g/dL 12.0  11.9  13.6   HCT 39.0 - 52.0 % 36.4  34.8  38.5   Platelets 150.0 - 400.0 K/uL 325.0  256  405   NEUT# 1.4 - 7.7 K/uL  5.9   7,780   Lymph# 0.7 - 4.0 K/uL 2.1   1,866      There is no height or weight on file to calculate BMI.  Orders:  No orders of the defined types were placed in this encounter.  No orders of the defined types were placed in this encounter.    Procedures: No procedures performed  Clinical Data: No additional findings.  ROS:  All other systems negative, except as noted in the HPI. Review of Systems  Objective: Vital Signs: There were no vitals taken for this visit.  Specialty Comments:  No specialty comments available.  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   Acute coronary syndrome ([place]) 06/22/2018   Nonhealing skin ulcer ([place]) 06/22/2018   Coronary artery disease involving native coronary artery with unstable angina pectoris ([place]) 06/22/2018   Acute myocardial infarction ([place]) 06/22/2018   Hyperlipidemia    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   Vertigo 08/20/2016   Myringotomy tube status 08/20/2016   Ear fullness, left 08/20/2016   Asymmetrical left sensorineural hearing loss 02/06/2016   Neuropathy of left foot 02/03/2015   Charcot's joint of left foot, non-diabetic 02/03/2015   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   Cramping of hands 06/02/2014   Eustachian tube dysfunction 06/02/2014   HTN (hypertension) 05/07/2014   Obstructive sleep apnea 05/07/2014   Past Medical History:  Diagnosis Date   Acute myocardial infarction ([place]) 06/22/2018   Allergy    Charcot's joint of left foot, non-diabetic    Coronary artery disease involving native coronary artery with unstable angina pectoris ([place]) 06/22/2018   Diabetes ([place])    type 2   GERD (gastroesophageal reflux disease)    History of kidney stones    passed   Hyperlipidemia    Hypertension    Neuropathy of left foot    Nodular basal cell carcinoma (BCC) 07/10/2019   Right V of Neck (curet and 5FU)   Pneumonia    As a child   Right foot ulcer ([place])    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   LIMA to LAD, SVG to Diag, SVG to OM2, SVG to [name], EVH via right thigh and leg   Type II diabetes mellitus with complication, uncontrolled    Uncontrolled type 2 diabetes mellitus     Family History  Problem Relation Age of Onset   Diabetes Mother    Stroke Father    Hypertension Father    Hearing loss Father    Colon polyps Father    Colon cancer Neg Hx    Esophageal cancer Neg Hx    Prostate cancer Neg Hx     Past Surgical History:  Procedure Laterality Date   AMPUTATION Right 01/06/2020    Procedure: RIGHT FOOT 2ND RAY AMPUTATION;  Surgeon: Newt Minion, MD;  Location: [facility];  Service: Orthopedics;  Laterality: Right;   AMPUTATION Left 01/11/2021   Procedure: LEFT FOOT 3RD RAY AMPUTATION;  Surgeon: Newt Minion, MD;  Location: [facility];  Service: Orthopedics;  Laterality: Left;  AMPUTATION Left 12/22/2021   Procedure: LEFT TRANSMETATARSAL AMPUTATION;  Surgeon: Newt Minion, MD;  Location: [facility];  Service: Orthopedics;  Laterality: Left;   APPLICATION OF WOUND VAC  12/22/2021   Procedure: APPLICATION OF WOUND VAC;  Surgeon: Newt Minion, MD;  Location: [facility];  Service: Orthopedics;;   COLONOSCOPY W/ POLYPECTOMY     CORONARY ARTERY BYPASS GRAFT N/A 06/25/2018   Procedure: CORONARY ARTERY BYPASS GRAFTING (CABG) TIMES FOUR USING LEFT INTERNAL MAMMARY ARTERY AND RIGHT ENDOSCOPICALLY HARVESTED SAPHENOUS VEIN;  Surgeon: Rexene Alberts, MD;  Location: [facility];  Service: Open Heart Surgery;  Laterality: N/A;   CYSTOSCOPY WITH STENT PLACEMENT Left 12/04/2021   Procedure: CYSTOSCOPY/RETROGRADE/ STENT PLACEMENT;  Surgeon: Vira Agar, MD;  Location[facility];  Service: Urology;  Laterality: Left;   CYSTOSCOPY/URETEROSCOPY/HOLMIUM LASER/STENT PLACEMENT Left 01/05/2022   Procedure: CYSTOSCOPY LEFT URETERAL STENT REMOVAL  LEFT RETROGRADE PYELOGRAM URETEROSCOPY BASKET STONE EXTRACTION STENT PLACEMENT;  Surgeon: Lucas Mallow, MD;  Location[facility];  Service: Urology;  Laterality: Left;  1 HR FOR CASE   ENDOVEIN HARVEST OF GREATER SAPHENOUS VEIN Right 06/25/2018   Procedure: ENDOVEIN HARVEST OF GREATER SAPHENOUS VEIN;  Surgeon: Rexene Alberts, MD;  Location: [facility];  Service: Open Heart Surgery;  Laterality: Right;   EXTRACORPOREAL SHOCK WAVE LITHOTRIPSY Left 12/07/2021   Procedure: EXTRACORPOREAL SHOCK WAVE LITHOTRIPSY (ESWL);  Surgeon: Festus Aloe, MD;  Location: [facility];  Service: Urology;  Laterality: Left;   LEFT HEART CATH AND CORONARY ANGIOGRAPHY N/A  06/22/2018   Procedure: LEFT HEART CATH AND CORONARY ANGIOGRAPHY;  Surgeon: Nigel Mormon, MD;  Location: [facility];  Service: Cardiovascular;  Laterality: N/A;   surgical debridement  Right    Right foot ulcer   TEE WITHOUT CARDIOVERSION N/A 06/25/2018   Procedure: TRANSESOPHAGEAL ECHOCARDIOGRAM (TEE);  Surgeon: Rexene Alberts, MD;  Location: [facility];  Service: Open Heart Surgery;  Laterality: N/A;   Social History   Occupational History   Occupation: attorney  Tobacco Use   Smoking status: Never   Smokeless tobacco: Never  Vaping Use   Vaping Use: Never used  Substance and Sexual Activity   Alcohol use: Yes    Alcohol/week: 2.0 standard drinks of alcohol    Types: 2 Cans of beer per week    Comment: Rare   Drug use: Never   Sexual activity: Not on file

## 2022-02-27 ENCOUNTER — Other Ambulatory Visit (HOSPITAL_COMMUNITY): Payer: Self-pay

## 2022-02-27 NOTE — Anesthesia Preprocedure Evaluation (Signed)
Anesthesia Evaluation  Patient identified by MRN, date of birth, ID band Patient awake    Reviewed: Allergy & Precautions, NPO status , Patient's Chart, lab work & pertinent test results  Airway Mallampati: II  TM Distance: >3 FB Neck ROM: Full    Dental no notable dental hx. (+) Implants, Teeth Intact, Dental Advisory Given   Pulmonary    Pulmonary exam normal breath sounds clear to auscultation       Cardiovascular hypertension, + CAD, + Past MI (2019) and + CABG (2019)  Normal cardiovascular exam Rhythm:Regular Rate:Normal  2019 Echo EF 50 to 55%   Neuro/Psych    GI/Hepatic GERD  ,  Endo/Other  diabetes, Type 2  Renal/GU Renal InsufficiencyRenal diseaseLab Results      Component                Value               Date                      CREATININE               1.24                02/12/2022                BUN                      23                  02/12/2022                NA                       137                 02/12/2022                K                        4.6                 02/12/2022                CL                       101                 02/12/2022                CO2                      27                  02/12/2022                Musculoskeletal  (+) Arthritis , Osteoarthritis,    Abdominal   Peds  Hematology Lab Results      Component                Value               Date                      WBC  9.0                 02/12/2022                HGB                      12.0 (L)            02/12/2022                HCT                      36.4 (L)            02/12/2022                MCV                      91.8                02/12/2022                PLT                      325.0               02/12/2022              Anesthesia Other Findings ALL: Jaurdiance  S/P L TMA now for Skin Graft  Reproductive/Obstetrics                             Anesthesia Physical Anesthesia Plan  ASA: 3  Anesthesia Plan: General   Post-op Pain Management: Minimal or no pain anticipated   Induction: Intravenous  PONV Risk Score and Plan: 3 and Treatment may vary due to age or medical condition and Ondansetron  Airway Management Planned: LMA  Additional Equipment: None  Intra-op Plan:   Post-operative Plan:   Informed Consent: I have reviewed the patients History and Physical, chart, labs and discussed the procedure including the risks, benefits and alternatives for the proposed anesthesia with the patient or authorized representative who has indicated his/her understanding and acceptance.     Dental advisory given  Plan Discussed with: CRNA and Anesthesiologist  Anesthesia Plan Comments:       Anesthesia Quick Evaluation

## 2022-02-28 ENCOUNTER — Other Ambulatory Visit: Payer: Self-pay

## 2022-02-28 ENCOUNTER — Ambulatory Visit (HOSPITAL_COMMUNITY): Payer: 59 | Admitting: Anesthesiology

## 2022-02-28 ENCOUNTER — Encounter (HOSPITAL_COMMUNITY)
Admission: RE | Disposition: A | Discharge status: Home / Self Care | Payer: Self-pay | Source: Home / Self Care | Attending: Orthopedic Surgery

## 2022-02-28 ENCOUNTER — Ambulatory Visit (HOSPITAL_BASED_OUTPATIENT_CLINIC_OR_DEPARTMENT_OTHER): Payer: 59 | Admitting: Anesthesiology

## 2022-02-28 ENCOUNTER — Ambulatory Visit (HOSPITAL_COMMUNITY)
Admission: RE | Admit: 2022-02-28 | Discharge: 2022-02-28 | Disposition: A | Discharge status: Home / Self Care | Payer: 59 | Attending: Orthopedic Surgery | Admitting: Orthopedic Surgery

## 2022-02-28 DIAGNOSIS — T8781 Dehiscence of amputation stump: Secondary | ICD-10-CM | POA: Diagnosis not present

## 2022-02-28 DIAGNOSIS — I2510 Atherosclerotic heart disease of native coronary artery without angina pectoris: Secondary | ICD-10-CM | POA: Diagnosis not present

## 2022-02-28 DIAGNOSIS — L97521 Non-pressure chronic ulcer of other part of left foot limited to breakdown of skin: Secondary | ICD-10-CM | POA: Diagnosis not present

## 2022-02-28 DIAGNOSIS — E11621 Type 2 diabetes mellitus with foot ulcer: Secondary | ICD-10-CM | POA: Insufficient documentation

## 2022-02-28 DIAGNOSIS — I10 Essential (primary) hypertension: Secondary | ICD-10-CM

## 2022-02-28 DIAGNOSIS — E119 Type 2 diabetes mellitus without complications: Secondary | ICD-10-CM

## 2022-02-28 DIAGNOSIS — Z89432 Acquired absence of left foot: Secondary | ICD-10-CM | POA: Diagnosis not present

## 2022-02-28 DIAGNOSIS — L02612 Cutaneous abscess of left foot: Secondary | ICD-10-CM | POA: Diagnosis not present

## 2022-02-28 DIAGNOSIS — T8130XA Disruption of wound, unspecified, initial encounter: Secondary | ICD-10-CM | POA: Insufficient documentation

## 2022-02-28 DIAGNOSIS — Y838 Other surgical procedures as the cause of abnormal reaction of the patient, or of later complication, without mention of misadventure at the time of the procedure: Secondary | ICD-10-CM | POA: Diagnosis not present

## 2022-02-28 DIAGNOSIS — X58XXXA Exposure to other specified factors, initial encounter: Secondary | ICD-10-CM | POA: Diagnosis not present

## 2022-02-28 HISTORY — PX: SKIN SPLIT GRAFT: SHX444

## 2022-02-28 LAB — GLUCOSE, CAPILLARY
Glucose-Capillary: 144 mg/dL — ABNORMAL HIGH (ref 70–99)
Glucose-Capillary: 115 mg/dL — ABNORMAL HIGH (ref 70–99)

## 2022-02-28 SURGERY — APPLICATION, GRAFT, SKIN, SPLIT-THICKNESS
Anesthesia: General | Laterality: Left

## 2022-02-28 MED ORDER — MIDAZOLAM HCL 2 MG/2ML IJ SOLN
INTRAMUSCULAR | Status: AC
  Filled 2022-02-28: qty 2

## 2022-02-28 MED ORDER — ACETAMINOPHEN 10 MG/ML IV SOLN: 1000.0000 mg | Freq: Once | INTRAVENOUS | Status: DC | PRN

## 2022-02-28 MED ORDER — ONDANSETRON HCL 4 MG/2ML IJ SOLN
INTRAMUSCULAR | Status: AC
  Filled 2022-02-28: qty 2

## 2022-02-28 MED ORDER — LACTATED RINGERS IV SOLN: INTRAVENOUS | Status: DC

## 2022-02-28 MED ORDER — FENTANYL CITRATE (PF) 250 MCG/5ML IJ SOLN
INTRAMUSCULAR | Status: DC | PRN
Start: 2022-02-28 — End: 2022-02-28
  Administered 2022-02-28 (×2): 50 ug via INTRAVENOUS

## 2022-02-28 MED ORDER — CEFAZOLIN SODIUM-DEXTROSE 2-4 GM/100ML-% IV SOLN
2.0000 g | INTRAVENOUS | Status: AC
  Administered 2022-02-28: 2 g via INTRAVENOUS

## 2022-02-28 MED ORDER — PHENYLEPHRINE 80 MCG/ML (10ML) SYRINGE FOR IV PUSH (FOR BLOOD PRESSURE SUPPORT)
PREFILLED_SYRINGE | INTRAVENOUS | Status: AC
  Filled 2022-02-28: qty 10

## 2022-02-28 MED ORDER — LIDOCAINE 2% (20 MG/ML) 5 ML SYRINGE
INTRAMUSCULAR | Status: DC | PRN
  Administered 2022-02-28: 100 mg via INTRAVENOUS

## 2022-02-28 MED ORDER — CEFAZOLIN SODIUM-DEXTROSE 2-4 GM/100ML-% IV SOLN
INTRAVENOUS | Status: AC
  Filled 2022-02-28: qty 100

## 2022-02-28 MED ORDER — CHLORHEXIDINE GLUCONATE 0.12 % MT SOLN: 15.0000 mL | Freq: Once | OROMUCOSAL | Status: DC

## 2022-02-28 MED ORDER — CHLORHEXIDINE GLUCONATE 0.12 % MT SOLN
OROMUCOSAL | Status: AC
  Filled 2022-02-28: qty 15

## 2022-02-28 MED ORDER — OXYCODONE HCL 5 MG PO TABS: 5.0000 mg | ORAL_TABLET | Freq: Once | ORAL | Status: DC | PRN

## 2022-02-28 MED ORDER — PHENYLEPHRINE 80 MCG/ML (10ML) SYRINGE FOR IV PUSH (FOR BLOOD PRESSURE SUPPORT)
PREFILLED_SYRINGE | INTRAVENOUS | Status: DC | PRN
  Administered 2022-02-28: 80 ug via INTRAVENOUS

## 2022-02-28 MED ORDER — FENTANYL CITRATE (PF) 250 MCG/5ML IJ SOLN
INTRAMUSCULAR | Status: AC
  Filled 2022-02-28: qty 5

## 2022-02-28 MED ORDER — 0.9 % SODIUM CHLORIDE (POUR BTL) OPTIME
TOPICAL | Status: DC | PRN
  Administered 2022-02-28: 1000 mL

## 2022-02-28 MED ORDER — ONDANSETRON HCL 4 MG/2ML IJ SOLN: 4.0000 mg | Freq: Once | INTRAMUSCULAR | Status: DC | PRN

## 2022-02-28 MED ORDER — MIDAZOLAM HCL 2 MG/2ML IJ SOLN
INTRAMUSCULAR | Status: DC | PRN
  Administered 2022-02-28: 2 mg via INTRAVENOUS

## 2022-02-28 MED ORDER — ORAL CARE MOUTH RINSE: 15.0000 mL | Freq: Once | OROMUCOSAL | Status: DC

## 2022-02-28 MED ORDER — PROPOFOL 10 MG/ML IV BOLUS
INTRAVENOUS | Status: DC | PRN
  Administered 2022-02-28: 150 mg via INTRAVENOUS

## 2022-02-28 MED ORDER — ONDANSETRON HCL 4 MG/2ML IJ SOLN
INTRAMUSCULAR | Status: DC | PRN
  Administered 2022-02-28: 4 mg via INTRAVENOUS

## 2022-02-28 MED ORDER — AMISULPRIDE (ANTIEMETIC) 5 MG/2ML IV SOLN: 10.0000 mg | Freq: Once | INTRAVENOUS | Status: DC | PRN

## 2022-02-28 MED ORDER — HYDROMORPHONE HCL 1 MG/ML IJ SOLN: 0.2500 mg | INTRAMUSCULAR | Status: DC | PRN

## 2022-02-28 MED ORDER — OXYCODONE HCL 5 MG/5ML PO SOLN: 5.0000 mg | Freq: Once | ORAL | Status: DC | PRN

## 2022-02-28 MED ORDER — MUPIROCIN 2 % EX OINT
TOPICAL_OINTMENT | CUTANEOUS | Status: AC
  Filled 2022-02-28: qty 22

## 2022-02-28 MED ORDER — LIDOCAINE 2% (20 MG/ML) 5 ML SYRINGE
INTRAMUSCULAR | Status: AC
  Filled 2022-02-28: qty 5

## 2022-02-28 SURGICAL SUPPLY — 45 items
BAG COUNTER SPONGE SURGICOUNT (BAG) ×2 IMPLANT
BNDG COHESIVE 6X5 TAN STRL LF (GAUZE/BANDAGES/DRESSINGS) IMPLANT
BNDG ESMARK 4X9 LF (GAUZE/BANDAGES/DRESSINGS) ×2 IMPLANT
BNDG GAUZE ELAST 4 BULKY (GAUZE/BANDAGES/DRESSINGS) IMPLANT
COVER SURGICAL LIGHT HANDLE (MISCELLANEOUS) ×4 IMPLANT
CUFF TOURN SGL QUICK 18X4 (TOURNIQUET CUFF) IMPLANT
CUFF TOURN SGL QUICK 24 (TOURNIQUET CUFF)
CUFF TRNQT CYL 24X4X16.5-23 (TOURNIQUET CUFF) IMPLANT
DERMACARRIERS GRAFT 1 TO 1.5 (DISPOSABLE)
DRAPE DERMATAC (DRAPES) ×2 IMPLANT
DRAPE U-SHAPE 47X51 STRL (DRAPES) ×2 IMPLANT
DRSG ADAPTIC 3X8 NADH LF (GAUZE/BANDAGES/DRESSINGS) IMPLANT
DRSG CLEANSE VERAFLOW (GAUZE/BANDAGES/DRESSINGS) ×1 IMPLANT
DRSG MEPITEL 4X7.2 (GAUZE/BANDAGES/DRESSINGS) ×2 IMPLANT
DURAPREP 26ML APPLICATOR (WOUND CARE) ×2 IMPLANT
ELECT REM PT RETURN 9FT ADLT (ELECTROSURGICAL) ×2
ELECTRODE REM PT RTRN 9FT ADLT (ELECTROSURGICAL) ×1 IMPLANT
GAUZE SPONGE 4X4 12PLY STRL (GAUZE/BANDAGES/DRESSINGS) IMPLANT
GLOVE BIOGEL PI IND STRL 9 (GLOVE) ×1 IMPLANT
GLOVE BIOGEL PI INDICATOR 9 (GLOVE) ×1
GLOVE SURG ORTHO 9.0 STRL STRW (GLOVE) ×2 IMPLANT
GOWN STRL REUS W/ TWL XL LVL3 (GOWN DISPOSABLE) ×2 IMPLANT
GOWN STRL REUS W/TWL XL LVL3 (GOWN DISPOSABLE) ×4
GRAFT DERMACARRIERS 1 TO 1.5 (DISPOSABLE) IMPLANT
GRAFT SKIN WND MICRO 38 (Tissue) ×2 IMPLANT
GRAFT SKIN WND OMEGA3 SB 7X10 (Tissue) ×1 IMPLANT
KIT BASIN OR (CUSTOM PROCEDURE TRAY) ×2 IMPLANT
KIT DRSG PREVENA PLUS 7DAY 125 (MISCELLANEOUS) ×1 IMPLANT
KIT TURNOVER KIT B (KITS) ×2 IMPLANT
MANIFOLD NEPTUNE II (INSTRUMENTS) ×2 IMPLANT
NDL HYPO 25GX1X1/2 BEV (NEEDLE) IMPLANT
NEEDLE HYPO 25GX1X1/2 BEV (NEEDLE) IMPLANT
NS IRRIG 1000ML POUR BTL (IV SOLUTION) ×2 IMPLANT
PACK ORTHO EXTREMITY (CUSTOM PROCEDURE TRAY) ×2 IMPLANT
PAD ARMBOARD 7.5X6 YLW CONV (MISCELLANEOUS) ×4 IMPLANT
PAD CAST 4YDX4 CTTN HI CHSV (CAST SUPPLIES) IMPLANT
PADDING CAST COTTON 4X4 STRL (CAST SUPPLIES)
SUCTION FRAZIER HANDLE 10FR (MISCELLANEOUS)
SUCTION TUBE FRAZIER 10FR DISP (MISCELLANEOUS) IMPLANT
SUT ETHILON 4 0 PS 2 18 (SUTURE) IMPLANT
SYR CONTROL 10ML LL (SYRINGE) IMPLANT
TOWEL GREEN STERILE (TOWEL DISPOSABLE) ×2 IMPLANT
TOWEL GREEN STERILE FF (TOWEL DISPOSABLE) ×2 IMPLANT
TUBE CONNECTING 12X1/4 (SUCTIONS) IMPLANT
WATER STERILE IRR 1000ML POUR (IV SOLUTION) ×2 IMPLANT

## 2022-02-28 NOTE — Interval H&P Note (Signed)
History and Physical Interval Note:  02/28/2022 10:06 AM  Joshua Branch  has presented today for surgery, with the diagnosis of Open Wound Left Foot.  The various methods of treatment have been discussed with the patient and family. After consideration of risks, benefits and other options for treatment, the patient has consented to  Procedure(s): LEFT FOOT SKIN GRAFT (Left) as a surgical intervention.  [name] history has been reviewed, patient examined, no change in status, stable for surgery.  I have reviewed the patient's chart and labs.  Questions were answered to the patient's satisfaction.     Newt Minion

## 2022-02-28 NOTE — Transfer of Care (Signed)
Immediate Anesthesia Transfer of Care Note  Patient: KALVEN GANIM  Procedure(s) Performed: LEFT FOOT SKIN GRAFT (Left)  Patient Location: PACU  Anesthesia Type:General  Level of Consciousness: awake, alert  and oriented  Airway & Oxygen Therapy: Patient Spontanous Breathing and Patient connected to nasal cannula oxygen  Post-op Assessment: Report given to RN and Post -op Vital signs reviewed and stable  Post vital signs: Reviewed and stable  Last Vitals:  Vitals Value Taken Time  BP 113/76 02/28/22 1055  Temp    Pulse 70 02/28/22 1058  Resp 13 02/28/22 1058  SpO2 95 % 02/28/22 1058  Vitals shown include unvalidated device data.  Last Pain:  Vitals:   02/28/22 0809  TempSrc: Oral         Complications: No notable events documented.

## 2022-02-28 NOTE — Anesthesia Postprocedure Evaluation (Signed)
Anesthesia Post Note  [name]  Procedure(s) Performed: LEFT FOOT SKIN GRAFT (Left)     Patient location during evaluation: PACU Anesthesia Type: General Level of consciousness: awake and alert Pain management: pain level controlled Vital Signs Assessment: post-procedure vital signs reviewed and stable Respiratory status: spontaneous breathing, nonlabored ventilation, respiratory function stable and patient connected to nasal cannula oxygen Cardiovascular status: blood pressure returned to baseline and stable Postop Assessment: no apparent nausea or vomiting Anesthetic complications: no   No notable events documented.  Last Vitals:  Vitals:   02/28/22 1115 02/28/22 1130  BP: 122/76 122/77  Pulse: 68 67  Resp: 20 20  Temp:  36.5 C  SpO2: 95% 95%    Last Pain:  Vitals:   02/28/22 1130  TempSrc:   PainSc: 0-No pain                 Barnet Glasgow

## 2022-02-28 NOTE — H&P (Signed)
Joshua Branch is an 61 y.o. male.   Chief Complaint: Open wound status post left transmetatarsal amputation. HPI: Patient is a 61 year old gentleman is 2 months status post left transmetatarsal amputation.  Patient has had wound dehiscence and breakdown and is currently completing a course of clindamycin as well as using nitroglycerin patches.  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   LIMA to LAD, SVG to Diag, SVG to OM2, SVG to [name], EVH via right thigh and leg   Type II diabetes mellitus with complication, uncontrolled    Uncontrolled type 2 diabetes mellitus     Past Surgical History:  Procedure Laterality Date   AMPUTATION Right 01/06/2020   Procedure: RIGHT FOOT 2ND RAY AMPUTATION;  Surgeon: Newt Minion, MD;  Location: [facility];  Service: Orthopedics;  Laterality: Right;   AMPUTATION Left 01/11/2021   Procedure: LEFT FOOT 3RD RAY AMPUTATION;  Surgeon: Newt Minion, MD;  Location: [facility];  Service: Orthopedics;  Laterality: Left;   AMPUTATION Left 12/22/2021   Procedure: LEFT TRANSMETATARSAL AMPUTATION;  Surgeon: Newt Minion, MD;  Location: [facility];  Service: Orthopedics;  Laterality: Left;   APPLICATION OF WOUND VAC  12/22/2021   Procedure: APPLICATION OF WOUND VAC;  Surgeon: Newt Minion, MD;  Location: [facility];  Service: Orthopedics;;   COLONOSCOPY W/ POLYPECTOMY     CORONARY ARTERY BYPASS GRAFT N/A 06/25/2018   Procedure: CORONARY ARTERY  BYPASS GRAFTING (CABG) TIMES FOUR USING LEFT INTERNAL MAMMARY ARTERY AND RIGHT ENDOSCOPICALLY HARVESTED SAPHENOUS VEIN;  Surgeon: Rexene Alberts, MD;  Location: [facility];  Service: Open Heart Surgery;  Laterality: N/A;   CYSTOSCOPY WITH STENT PLACEMENT Left 12/04/2021   Procedure: CYSTOSCOPY/RETROGRADE/ STENT PLACEMENT;  Surgeon: Vira Agar, MD;  [facility];  Service: Urology;  Laterality: Left;   CYSTOSCOPY/URETEROSCOPY/HOLMIUM LASER/STENT PLACEMENT Left 01/05/2022   Procedure: CYSTOSCOPY LEFT URETERAL STENT REMOVAL  LEFT RETROGRADE PYELOGRAM URETEROSCOPY BASKET STONE EXTRACTION STENT PLACEMENT;  Surgeon: Lucas Mallow, MD;  [facility];  Service: Urology;  Laterality: Left;  1 HR FOR CASE   ENDOVEIN HARVEST OF GREATER SAPHENOUS VEIN Right 06/25/2018   Procedure: ENDOVEIN HARVEST OF GREATER SAPHENOUS VEIN;  Surgeon: Rexene Alberts, MD;  Location: [facility];  Service: Open Heart Surgery;  Laterality: Right;   EXTRACORPOREAL SHOCK WAVE LITHOTRIPSY Left 12/07/2021   Procedure: EXTRACORPOREAL SHOCK WAVE LITHOTRIPSY (ESWL);  Surgeon: Festus Aloe, MD;  Location: [facility];  Service: Urology;  Laterality: Left;   LEFT HEART CATH AND CORONARY ANGIOGRAPHY N/A 06/22/2018   Procedure: LEFT HEART CATH AND CORONARY ANGIOGRAPHY;  Surgeon: Nigel Mormon, MD;  Location: [facility];  Service: Cardiovascular;  Laterality: N/A;   surgical debridement  Right    Right foot ulcer   TEE WITHOUT CARDIOVERSION N/A 06/25/2018   Procedure:  TRANSESOPHAGEAL ECHOCARDIOGRAM (TEE);  Surgeon: Rexene Alberts, MD;  Location: [facility];  Service: Open Heart Surgery;  Laterality: N/A;    Family History  Problem Relation Age of Onset   Diabetes Mother    Stroke Father    Hypertension Father    Hearing loss Father    Colon polyps Father    Colon cancer Neg Hx    Esophageal cancer Neg Hx    Prostate cancer Neg Hx    Social History:  reports that he has never smoked. He has  never used smokeless tobacco. He reports that he does not currently use alcohol after a past usage of about 2.0 standard drinks of alcohol per week. He reports that he does not use drugs.  Allergies:  Allergies  Allergen Reactions   Apricot Kernel Oil [Prunus] Swelling    THROAT   Cherry Swelling    THROAT   Jardiance [Empagliflozin] Diarrhea and Nausea And Vomiting   Other Other (See Comments)    Fruits with a pit. Throat swells up. Can have them if cooked    Peach [Prunus Persica] Swelling    THROAT   Plum Pulp Swelling    THROAT    No medications prior to admission.    No results found for this or any previous visit (from the past 48 hour(s)). No results found.  Review of Systems  All other systems reviewed and are negative.   There were no vitals taken for this visit. Physical Exam  Patient is alert, oriented, no adenopathy, well-dressed, normal affect, normal respiratory effort. Examination there is no cellulitis or dermatitis the color has improved in the left foot.  The ulcer measures 7 x 10 cm.  The ulcer does not probe to bone or tendon there is no drainage no signs of any deep bone infection. Assessment/Plan 1. History of transmetatarsal amputation of left foot ([place])   2. Non-pressure chronic ulcer of other part of left foot limited to breakdown of skin ([place])       Plan: The wound bed is stable feel like it would be appropriate to proceed with tissue graft at this time.  Plan for Kerecis tissue graft on Wednesday as an outpatient with placement of a wound VAC.  Recommended Benadryl for the rash.  Recommended not using prednisone due to its potential for wound healing problems.  Newt Minion, MD 02/28/2022, 6:42 AM

## 2022-02-28 NOTE — Anesthesia Procedure Notes (Signed)
Procedure Name: LMA Insertion Date/Time: 02/28/2022 10:26 AM  Performed by: Mariea Clonts, CRNAPre-anesthesia Checklist: Patient identified, Emergency Drugs available, Suction available and Patient being monitored Patient Re-evaluated:Patient Re-evaluated prior to induction Oxygen Delivery Method: Circle System Utilized Preoxygenation: Pre-oxygenation with 100% oxygen Induction Type: IV induction Ventilation: Mask ventilation without difficulty LMA: LMA inserted LMA Size: 5.0 Number of attempts: 1 Airway Equipment and Method: Bite block Placement Confirmation: positive ETCO2 Tube secured with: Tape Dental Injury: Teeth and Oropharynx as per pre-operative assessment

## 2022-02-28 NOTE — Op Note (Signed)
02/28/2022  11:08 AM  PATIENT:  Joshua Branch    PRE-OPERATIVE DIAGNOSIS: Wound dehiscence status post transmetatarsal amputation  POST-OPERATIVE DIAGNOSIS:  Same  PROCEDURE:  LEFT FOOT BIOLOGIC SKIN GRAFT Wound bed preparation. Application of cleanse choice wound VAC sponge.  SURGEON:  Newt Minion, MD  PHYSICIAN ASSISTANT:None ANESTHESIA:   General  PREOPERATIVE INDICATIONS:  Joshua Branch is a  61 y.o. male with a diagnosis of Open Wound Left Foot who failed conservative measures and elected for surgical management.    The risks benefits and alternatives were discussed with the patient preoperatively including but not limited to the risks of infection, bleeding, nerve injury, cardiopulmonary complications, the need for revision surgery, among others, and the patient was willing to proceed.  OPERATIVE IMPLANTS: Kerecis tissue graft 7 x 10 cm sheet, 38 cm micro powder x2. Cleanse choice wound VAC sponge x1.  '@ENCIMAGES'$ @  OPERATIVE FINDINGS: Good petechial bleeding after debridement no exposed bone or to  OPERATIVE PROCEDURE: Dense  Patient was brought the operating room underwent a LMA anesthetic.  After adequate levels anesthesia were obtained patient's left lower extremity was prepped using DuraPrep draped into a sterile field a timeout was called.  A 21 blade knife was used to debride the skin and soft tissue back to bleeding viable healthy tissue.  After debridement the wound was 11 x 12 cm.  The wound bed was cleansed with normal saline no signs of infection.  The wound was then covered with Kerecis 38 cm of micro powder x2.  This was covered by a 7 x 10 Kerecis sheet secured with staples.  This was covered with the cleanse choice sponge secured with derma tack covered with Coban.  This had a good suction fit patient was extubated taken the PACU in stable condition.  Debridement type: Excisional Debridement  Side: left  Body Location: foot   Tools used for  debridement: scalpel  Pre-debridement Wound size (cm):   Length: 7        Width: 10     Depth: 1   Post-debridement Wound size (cm):   Length: 10        Width: 12     Depth: 2   Debridement depth beyond dead/damaged tissue down to healthy viable tissue: yes  Tissue layer involved: skin, subcutaneous tissue  Nature of tissue removed: Wellsite geologist Tissue  Irrigation volume: 1 liter     Irrigation fluid type: Normal Saline     DISCHARGE PLANNING:  Antibiotic duration: Kneeling scooter  Weightbearing: Nonweightbearing on the left  Pain medication: Patient requests no narcotics  Dressing care/ Wound VAC: Continue wound VAC for 1 week  Ambulatory devices: Kneeling scooter  Discharge to: Home.  Follow-up: In the office 1 week post operative.

## 2022-03-01 ENCOUNTER — Other Ambulatory Visit (HOSPITAL_COMMUNITY): Payer: Self-pay

## 2022-03-01 ENCOUNTER — Other Ambulatory Visit: Payer: Self-pay | Admitting: Medical

## 2022-03-01 ENCOUNTER — Encounter (HOSPITAL_COMMUNITY): Payer: Self-pay | Admitting: Orthopedic Surgery

## 2022-03-01 MED ORDER — HYDROCHLOROTHIAZIDE 12.5 MG PO CAPS
ORAL_CAPSULE | Freq: Every day | ORAL | 1 refills | Status: DC
  Filled 2022-03-01: qty 90, 90d supply, fill #0
  Filled 2022-05-18: qty 90, 90d supply, fill #1

## 2022-03-02 ENCOUNTER — Ambulatory Visit (INDEPENDENT_AMBULATORY_CARE_PROVIDER_SITE_OTHER): Payer: 59 | Admitting: Family

## 2022-03-02 ENCOUNTER — Other Ambulatory Visit (HOSPITAL_COMMUNITY): Payer: Self-pay

## 2022-03-02 DIAGNOSIS — Z89432 Acquired absence of left foot: Secondary | ICD-10-CM

## 2022-03-02 DIAGNOSIS — Z9889 Other specified postprocedural states: Secondary | ICD-10-CM

## 2022-03-02 MED ORDER — TESTOSTERONE 30 MG/ACT TD SOLN
TRANSDERMAL | 1 refills | Status: DC
  Filled 2022-03-02 – 2022-04-01 (×2): qty 90, 30d supply, fill #0
  Filled 2022-05-18: qty 90, 30d supply, fill #1

## 2022-03-05 ENCOUNTER — Telehealth: Payer: Self-pay | Admitting: Orthopedic Surgery

## 2022-03-05 ENCOUNTER — Encounter: Payer: 59 | Admitting: Orthopedic Surgery

## 2022-03-05 NOTE — Telephone Encounter (Signed)
Pt called requesting a letter stated he had a surgery on 8/2 and pt need to stay off his feet. Please send to pt's mychart. Pt phone number is [phone]

## 2022-03-05 NOTE — Telephone Encounter (Signed)
Letter written and sent through his mychart.

## 2022-03-06 ENCOUNTER — Encounter: Payer: Self-pay | Admitting: Family

## 2022-03-06 NOTE — Progress Notes (Signed)
Post-Op Visit Note   [name]           Date of Birth: [birth year]           MRN: [id number] Visit Date: 03/02/2022 PCP: Mackie Pai, PA-C  Chief Complaint: No chief complaint on file.   HPI:  HPI The patient is a 61 year old gentleman who presents status post irrigation and debridement with Kerecis graft placement to transmetatarsal amputation on left.  States his wound VAC has been alarming with an occlusion.  Ortho Exam On examination of the left foot the VAC dressing has ripped the hose is no longer connected to the dressing.  There is significant drainage from the [facility] dressing.  Dressing was removed the graft material is stapled in place.  There is no erythema or warmth of his foot no significant edema.  Visit Diagnoses: No diagnosis found.  Plan: Begin daily Dial soap cleansing dry dressings he will follow-up in the office in 1 week  Follow-Up Instructions: No follow-ups on file.   Imaging: No results found.  Orders:  No orders of the defined types were placed in this encounter.  No orders of the defined types were placed in this encounter.    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 ([place]) 01/18/2021   Osteomyelitis of third toe of left foot ([name]    Cutaneous abscess of left foot    Osteomyelitis of second toe of right foot ([place])    Poorly controlled type 2 diabetes mellitus with circulatory disorder ([place]) 07/15/2018   Numbness in both hands 07/15/2018   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   Acute coronary syndrome ([place]) 06/22/2018   Nonhealing skin ulcer ([place]) 06/22/2018   Coronary artery disease involving native coronary artery with unstable angina pectoris ([place]) 06/22/2018   Acute myocardial infarction ([place]) 06/22/2018   Hyperlipidemia    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   Vertigo 08/20/2016   Myringotomy tube status 08/20/2016   Ear  fullness, left 08/20/2016   Asymmetrical left sensorineural hearing loss 02/06/2016   Neuropathy of left foot 02/03/2015   Charcot's joint of left foot, non-diabetic 02/03/2015   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   Cramping of hands 06/02/2014   Eustachian tube dysfunction 06/02/2014   HTN (hypertension) 05/07/2014   Obstructive sleep apnea 05/07/2014   Past Medical History:  Diagnosis Date   Acute myocardial infarction ([place]) 06/22/2018   Allergy    Charcot's joint of left foot, non-diabetic    Coronary artery disease involving native coronary artery with unstable angina pectoris ([place]) 06/22/2018   Diabetes ([place])    type 2   GERD (gastroesophageal reflux disease)    History of kidney stones    passed   Hyperlipidemia    Hypertension    Neuropathy of left foot    Nodular basal cell carcinoma (BCC) 07/10/2019   Right V of Neck (curet and 5FU)   Pneumonia    As a child   Right foot ulcer ([place])    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   LIMA to LAD, SVG to Diag, SVG to OM2, SVG to [name], EVH via right thigh and leg   Type II diabetes mellitus with complication, uncontrolled    Uncontrolled type 2 diabetes mellitus     Family History  Problem Relation Age of Onset   Diabetes Mother    Stroke Father    Hypertension  Father    Hearing loss Father    Colon polyps Father    Colon cancer Neg Hx    Esophageal cancer Neg Hx    Prostate cancer Neg Hx     Past Surgical History:  Procedure Laterality Date   AMPUTATION Right 01/06/2020   Procedure: RIGHT FOOT 2ND RAY AMPUTATION;  Surgeon: Newt Minion, MD;  Location: [place];  Service: Orthopedics;  Laterality: Right;   AMPUTATION Left 01/11/2021   Procedure: LEFT FOOT 3RD RAY AMPUTATION;  Surgeon: Newt Minion, MD;  Location: [place];  Service: Orthopedics;  Laterality: Left;   AMPUTATION Left 12/22/2021   Procedure: LEFT TRANSMETATARSAL AMPUTATION;  Surgeon: Newt Minion, MD;  Location: [place];  Service: Orthopedics;  Laterality: Left;    APPLICATION OF WOUND VAC  12/22/2021   Procedure: APPLICATION OF WOUND VAC;  Surgeon: Newt Minion, MD;  Location: [place];  Service: Orthopedics;;   COLONOSCOPY W/ POLYPECTOMY     CORONARY ARTERY BYPASS GRAFT N/A 06/25/2018   Procedure: CORONARY ARTERY BYPASS GRAFTING (CABG) TIMES FOUR USING LEFT INTERNAL MAMMARY ARTERY AND RIGHT ENDOSCOPICALLY HARVESTED SAPHENOUS VEIN;  Surgeon: Rexene Alberts, MD;  Location: [place];  Service: Open Heart Surgery;  Laterality: N/A;   CYSTOSCOPY WITH STENT PLACEMENT Left 12/04/2021   Procedure: CYSTOSCOPY/RETROGRADE/ STENT PLACEMENT;  Surgeon: Vira Agar, MD;  [facility];  Service: Urology;  Laterality: Left;   CYSTOSCOPY/URETEROSCOPY/HOLMIUM LASER/STENT PLACEMENT Left 01/05/2022   Procedure: CYSTOSCOPY LEFT URETERAL STENT REMOVAL  LEFT RETROGRADE PYELOGRAM URETEROSCOPY BASKET STONE EXTRACTION STENT PLACEMENT;  Surgeon: Lucas Mallow, MD;  [facility];  Service: Urology;  Laterality: Left;  1 HR FOR CASE   ENDOVEIN HARVEST OF GREATER SAPHENOUS VEIN Right 06/25/2018   Procedure: ENDOVEIN HARVEST OF GREATER SAPHENOUS VEIN;  Surgeon: Rexene Alberts, MD;  Location: [place];  Service: Open Heart Surgery;  Laterality: Right;   EXTRACORPOREAL SHOCK WAVE LITHOTRIPSY Left 12/07/2021   Procedure: EXTRACORPOREAL SHOCK WAVE LITHOTRIPSY (ESWL);  Surgeon: Festus Aloe, MD;  Location: [facility];  Service: Urology;  Laterality: Left;   LEFT HEART CATH AND CORONARY ANGIOGRAPHY N/A 06/22/2018   Procedure: LEFT HEART CATH AND CORONARY ANGIOGRAPHY;  Surgeon: Nigel Mormon, MD;  Location: [place] [facility];  Service: Cardiovascular;  Laterality: N/A;   SKIN SPLIT GRAFT Left 02/28/2022   Procedure: LEFT FOOT SKIN GRAFT;  Surgeon: Newt Minion, MD;  Location: [place];  Service: Orthopedics;  Laterality: Left;   surgical debridement  Right    Right foot ulcer   TEE WITHOUT CARDIOVERSION N/A 06/25/2018   Procedure: TRANSESOPHAGEAL  ECHOCARDIOGRAM (TEE);  Surgeon: Rexene Alberts, MD;  Location: [place];  Service: Open Heart Surgery;  Laterality: N/A;   Social History   Occupational History   Occupation: attorney  Tobacco Use   Smoking status: Never   Smokeless tobacco: Never  Vaping Use   Vaping Use: Never used  Substance and Sexual Activity   Alcohol use: Not Currently    Alcohol/week: 2.0 standard drinks of alcohol    Types: 2 Cans of beer per week    Comment: Rare   Drug use: Never   Sexual activity: Not on file

## 2022-03-08 ENCOUNTER — Encounter: Payer: Self-pay | Admitting: Orthopedic Surgery

## 2022-03-08 ENCOUNTER — Ambulatory Visit (INDEPENDENT_AMBULATORY_CARE_PROVIDER_SITE_OTHER): Payer: 59 | Admitting: Orthopedic Surgery

## 2022-03-08 ENCOUNTER — Other Ambulatory Visit (HOSPITAL_BASED_OUTPATIENT_CLINIC_OR_DEPARTMENT_OTHER): Payer: Self-pay

## 2022-03-08 DIAGNOSIS — Z9889 Other specified postprocedural states: Secondary | ICD-10-CM

## 2022-03-08 DIAGNOSIS — Z89432 Acquired absence of left foot: Secondary | ICD-10-CM

## 2022-03-08 MED ORDER — SILVER SULFADIAZINE 1 % EX CREA
1.0000 | TOPICAL_CREAM | Freq: Every day | CUTANEOUS | 3 refills | Status: DC
  Filled 2022-03-08: qty 400, 30d supply, fill #0

## 2022-03-08 NOTE — Progress Notes (Signed)
Office Visit Note   [name]           Date of Birth: 01-24-1961           MRN: [id number] Visit Date: 03/08/2022              Requested by: Mackie Pai, PA-C [place],  [place] [address] PCP: Mackie Pai, PA-C  Chief Complaint  Patient presents with   Left Foot - Routine Post Op    12/22/2021 left transmet amputation 02/28/2022 left foot Kerecis graft      HPI: Patient is a 61 year old gentleman who is seen in follow-up for Kerecis tissue graft left foot transmetatarsal amputation.  Patient is currently nonweightbearing in a kneeling scooter using a nitro patch and tramadol.  Assessment & Plan: Visit Diagnoses:  1. History of transmetatarsal amputation of left foot ([place])   2. History of artificial skin graft     Plan: Start Dial soap cleansing Silvadene dressing changes nonweightbearing with elevation.  Follow-Up Instructions: Return in about 3 weeks (around 03/29/2022).   Ortho Exam  Patient is alert, oriented, no adenopathy, well-dressed, normal affect, normal respiratory effort. Examination there is granulation tissue with fibrinous exudative tissue superficially.  We will remove the staples today in the tissue graft is well incorporated.  Will start Dial soap cleansing Silvadene dressing changes.  Continue elevation.  Imaging: No results found.     Labs: Lab Results  Component Value Date   HGBA1C 6.2 (H) 01/04/2022   HGBA1C 7.3 (A) 06/14/2021   HGBA1C 7.4 (A) 01/27/2021   ESRSEDRATE 36 (H) 02/12/2022   REPTSTATUS 12/27/2021 FINAL 12/22/2021   GRAMSTAIN  12/22/2021    RARE WBC PRESENT,BOTH PMN AND MONONUCLEAR NO ORGANISMS SEEN    CULT  12/22/2021    RARE METHICILLIN RESISTANT STAPHYLOCOCCUS AUREUS NO ANAEROBES ISOLATED Performed at [facility], [place] [address], [place], [place] [address]    LABORGA METHICILLIN RESISTANT STAPHYLOCOCCUS AUREUS 12/22/2021     Lab Results  Component Value Date    ALBUMIN 3.8 02/12/2022   ALBUMIN 4.3 05/01/2021   ALBUMIN 3.9 05/08/2019    Lab Results  Component Value Date   MG 2.1 05/08/2019   MG 2.2 06/26/2018   MG 3.0 (H) 06/26/2018   No results found for: "VD25OH"  No results found for: "PREALBUMIN"    Latest Ref Rng & Units 02/12/2022    3:04 PM 12/22/2021    5:56 AM 12/01/2021    3:48 PM  CBC EXTENDED  WBC 4.0 - 10.5 K/uL 9.0  7.7  10.6   RBC 4.22 - 5.81 Mil/uL 3.97  3.76  4.27   Hemoglobin 13.0 - 17.0 g/dL 12.0  11.9  13.6   HCT 39.0 - 52.0 % 36.4  34.8  38.5   Platelets 150.0 - 400.0 K/uL 325.0  256  405   NEUT# 1.4 - 7.7 K/uL 5.9   7,780   Lymph# 0.7 - 4.0 K/uL 2.1   1,866      There is no height or weight on file to calculate BMI.  Orders:  No orders of the defined types were placed in this encounter.  Meds ordered this encounter  Medications   silver sulfADIAZINE (SILVADENE) 1 % cream    Sig: Apply 1 Application topically daily. Apply to affected area daily plus dry dressing    Dispense:  400 g    Refill:  3     Procedures: No procedures performed  Clinical Data: No additional findings.  ROS:  All other systems negative, except as noted in the HPI. Review of Systems  Objective: Vital Signs: There were no vitals taken for this visit.  Specialty Comments:  No specialty comments available.  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 ([place]) 01/18/2021   Osteomyelitis of third toe of left foot ([name]    Cutaneous abscess of left foot    Osteomyelitis of second toe of right foot ([place])    Poorly controlled type 2 diabetes mellitus with circulatory disorder ([place]) 07/15/2018   Numbness in both hands 07/15/2018   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   Acute coronary syndrome ([place]) 06/22/2018   Nonhealing skin ulcer ([place]) 06/22/2018   Coronary artery disease involving native coronary artery with unstable angina pectoris ([place]) 06/22/2018    Acute myocardial infarction ([place]) 06/22/2018   Hyperlipidemia    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   Vertigo 08/20/2016   Myringotomy tube status 08/20/2016   Ear fullness, left 08/20/2016   Asymmetrical left sensorineural hearing loss 02/06/2016   Neuropathy of left foot 02/03/2015   Charcot's joint of left foot, non-diabetic 02/03/2015   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   Cramping of hands 06/02/2014   Eustachian tube dysfunction 06/02/2014   HTN (hypertension) 05/07/2014   Obstructive sleep apnea 05/07/2014   Past Medical History:  Diagnosis Date   Acute myocardial infarction ([place]) 06/22/2018   Allergy    Charcot's joint of left foot, non-diabetic    Coronary artery disease involving native coronary artery with unstable angina pectoris ([place]) 06/22/2018   Diabetes ([place])    type 2   GERD (gastroesophageal reflux disease)    History of kidney stones    passed   Hyperlipidemia    Hypertension    Neuropathy of left foot    Nodular basal cell carcinoma (BCC) 07/10/2019   Right V of Neck (curet and 5FU)   Pneumonia    As a child   Right foot ulcer ([place])    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   LIMA to LAD, SVG to Diag, SVG to OM2, SVG to [name], EVH via right thigh and leg   Type II diabetes mellitus with complication, uncontrolled    Uncontrolled type 2 diabetes mellitus     Family History  Problem Relation Age of Onset   Diabetes Mother    Stroke Father    Hypertension Father    Hearing loss Father    Colon polyps Father    Colon cancer Neg Hx    Esophageal cancer Neg Hx    Prostate cancer Neg Hx     Past Surgical History:  Procedure Laterality Date   AMPUTATION Right 01/06/2020   Procedure: RIGHT FOOT 2ND RAY AMPUTATION;  Surgeon: Newt Minion, MD;  Location: [place];  Service: Orthopedics;  Laterality: Right;   AMPUTATION Left 01/11/2021   Procedure: LEFT FOOT 3RD RAY AMPUTATION;  Surgeon: Newt Minion, MD;  Location: [place];  Service: Orthopedics;   Laterality: Left;   AMPUTATION Left 12/22/2021   Procedure: LEFT TRANSMETATARSAL AMPUTATION;  Surgeon: Newt Minion, MD;  Location: [place];  Service: Orthopedics;  Laterality: Left;   APPLICATION OF WOUND VAC  12/22/2021   Procedure: APPLICATION OF WOUND VAC;  Surgeon: Newt Minion, MD;  Location: [place];  Service: Orthopedics;;   COLONOSCOPY W/ POLYPECTOMY     CORONARY  ARTERY BYPASS GRAFT N/A 06/25/2018   Procedure: CORONARY ARTERY BYPASS GRAFTING (CABG) TIMES FOUR USING LEFT INTERNAL MAMMARY ARTERY AND RIGHT ENDOSCOPICALLY HARVESTED SAPHENOUS VEIN;  Surgeon: Rexene Alberts, MD;  Location: [place];  Service: Open Heart Surgery;  Laterality: N/A;   CYSTOSCOPY WITH STENT PLACEMENT Left 12/04/2021   Procedure: CYSTOSCOPY/RETROGRADE/ STENT PLACEMENT;  Surgeon: Vira Agar, MD;  [facility];  Service: Urology;  Laterality: Left;   CYSTOSCOPY/URETEROSCOPY/HOLMIUM LASER/STENT PLACEMENT Left 01/05/2022   Procedure: CYSTOSCOPY LEFT URETERAL STENT REMOVAL  LEFT RETROGRADE PYELOGRAM URETEROSCOPY BASKET STONE EXTRACTION STENT PLACEMENT;  Surgeon: Lucas Mallow, MD;  [facility];  Service: Urology;  Laterality: Left;  1 HR FOR CASE   ENDOVEIN HARVEST OF GREATER SAPHENOUS VEIN Right 06/25/2018   Procedure: ENDOVEIN HARVEST OF GREATER SAPHENOUS VEIN;  Surgeon: Rexene Alberts, MD;  Location: [place];  Service: Open Heart Surgery;  Laterality: Right;   EXTRACORPOREAL SHOCK WAVE LITHOTRIPSY Left 12/07/2021   Procedure: EXTRACORPOREAL SHOCK WAVE LITHOTRIPSY (ESWL);  Surgeon: Festus Aloe, MD;  Location: [facility];  Service: Urology;  Laterality: Left;   LEFT HEART CATH AND CORONARY ANGIOGRAPHY N/A 06/22/2018   Procedure: LEFT HEART CATH AND CORONARY ANGIOGRAPHY;  Surgeon: Nigel Mormon, MD;  Location: [place] [facility];  Service: Cardiovascular;  Laterality: N/A;   SKIN SPLIT GRAFT Left 02/28/2022   Procedure: LEFT FOOT SKIN GRAFT;  Surgeon: Newt Minion, MD;  Location:  [place];  Service: Orthopedics;  Laterality: Left;   surgical debridement  Right    Right foot ulcer   TEE WITHOUT CARDIOVERSION N/A 06/25/2018   Procedure: TRANSESOPHAGEAL ECHOCARDIOGRAM (TEE);  Surgeon: Rexene Alberts, MD;  Location: [place];  Service: Open Heart Surgery;  Laterality: N/A;   Social History   Occupational History   Occupation: attorney  Tobacco Use   Smoking status: Never   Smokeless tobacco: Never  Vaping Use   Vaping Use: Never used  Substance and Sexual Activity   Alcohol use: Not Currently    Alcohol/week: 2.0 standard drinks of alcohol    Types: 2 Cans of beer per week    Comment: Rare   Drug use: Never   Sexual activity: Not on file

## 2022-03-09 ENCOUNTER — Other Ambulatory Visit (HOSPITAL_BASED_OUTPATIENT_CLINIC_OR_DEPARTMENT_OTHER): Payer: Self-pay

## 2022-03-09 ENCOUNTER — Other Ambulatory Visit (HOSPITAL_COMMUNITY): Payer: Self-pay

## 2022-03-09 ENCOUNTER — Other Ambulatory Visit: Payer: Self-pay | Admitting: Medical

## 2022-03-09 ENCOUNTER — Encounter: Payer: Self-pay | Admitting: Medical

## 2022-03-09 DIAGNOSIS — N528 Other male erectile dysfunction: Secondary | ICD-10-CM

## 2022-03-09 MED ORDER — SHINGRIX 50 MCG/0.5ML IM SUSR
INTRAMUSCULAR | 0 refills | Status: AC
  Filled 2022-03-09: qty 0.5, 1d supply, fill #0

## 2022-03-09 MED ORDER — SILDENAFIL CITRATE 50 MG PO TABS
50.0000 mg | ORAL_TABLET | Freq: Every day | ORAL | 0 refills | Status: DC | PRN
  Filled 2022-03-09: qty 10, 10d supply, fill #0

## 2022-03-19 ENCOUNTER — Other Ambulatory Visit (HOSPITAL_COMMUNITY): Payer: Self-pay

## 2022-03-28 ENCOUNTER — Other Ambulatory Visit (HOSPITAL_COMMUNITY): Payer: Self-pay

## 2022-03-28 ENCOUNTER — Other Ambulatory Visit: Payer: Self-pay | Admitting: Medical

## 2022-03-28 MED ORDER — SILDENAFIL CITRATE 50 MG PO TABS
50.0000 mg | ORAL_TABLET | Freq: Every day | ORAL | 0 refills | Status: DC | PRN
  Filled 2022-03-28: qty 6, 30d supply, fill #0
  Filled 2022-04-09: qty 4, 10d supply, fill #1
  Filled 2022-04-17: qty 4, 4d supply, fill #0

## 2022-03-29 ENCOUNTER — Encounter: Payer: Self-pay | Admitting: Orthopedic Surgery

## 2022-03-29 ENCOUNTER — Ambulatory Visit (INDEPENDENT_AMBULATORY_CARE_PROVIDER_SITE_OTHER): Payer: 59 | Admitting: Orthopedic Surgery

## 2022-03-29 DIAGNOSIS — Z9889 Other specified postprocedural states: Secondary | ICD-10-CM

## 2022-03-29 DIAGNOSIS — Z89432 Acquired absence of left foot: Secondary | ICD-10-CM | POA: Diagnosis not present

## 2022-03-29 NOTE — Progress Notes (Signed)
Office Visit Note   [name]           Date of Birth: 1961-02-11           MRN: [id number] Visit Date: 03/29/2022              Requested by: Mackie Pai, PA-C [place],  [place] [address] PCP: Mackie Pai, PA-C  Chief Complaint  Patient presents with   Left Foot - Routine Post Op    12/22/21 left transmet amputation with 3 x 7 kerecis 02/28/22 7 x 10 sheet and 38 micro       HPI: Patient is a 61 year old gentleman who is status post transmetatarsal amputation of the left and status post Kerecis tissue graft 4 weeks ago.  He had 38 cm of micro powder and a 7 x 10 mm sheet.  Patient has been doing Silvadene dressing changes daily using a kneeling scooter.  He has completed his antibiotics.  Assessment & Plan: Visit Diagnoses:  1. History of transmetatarsal amputation of left foot ([place])   2. History of artificial skin graft     Plan: Continue with current wound care.  We will evaluate for outpatient tissue graft.  Follow-Up Instructions: Return in about 2 weeks (around 04/12/2022).   Ortho Exam  Patient is alert, oriented, no adenopathy, well-dressed, normal affect, normal respiratory effort. Examination patient's wound has healthy granulation tissue it is flat it is 10 cm in diameter.  There is no cellulitis there is some venous and lymphatic swelling no signs of infection.  Imaging: No results found.      Labs: Lab Results  Component Value Date   HGBA1C 6.2 (H) 01/04/2022   HGBA1C 7.3 (A) 06/14/2021   HGBA1C 7.4 (A) 01/27/2021   ESRSEDRATE 36 (H) 02/12/2022   REPTSTATUS 12/27/2021 FINAL 12/22/2021   GRAMSTAIN  12/22/2021    RARE WBC PRESENT,BOTH PMN AND MONONUCLEAR NO ORGANISMS SEEN    CULT  12/22/2021    RARE METHICILLIN RESISTANT STAPHYLOCOCCUS AUREUS NO ANAEROBES ISOLATED Performed at [facility], [place] [address] [place], [place] [address]    LABORGA METHICILLIN RESISTANT STAPHYLOCOCCUS AUREUS  12/22/2021     Lab Results  Component Value Date   ALBUMIN 3.8 02/12/2022   ALBUMIN 4.3 05/01/2021   ALBUMIN 3.9 05/08/2019    Lab Results  Component Value Date   MG 2.1 05/08/2019   MG 2.2 06/26/2018   MG 3.0 (H) 06/26/2018   No results found for: "VD25OH"  No results found for: "PREALBUMIN"    Latest Ref Rng & Units 02/12/2022    3:04 PM 12/22/2021    5:56 AM 12/01/2021    3:48 PM  CBC EXTENDED  WBC 4.0 - 10.5 K/uL 9.0  7.7  10.6   RBC 4.22 - 5.81 Mil/uL 3.97  3.76  4.27   Hemoglobin 13.0 - 17.0 g/dL 12.0  11.9  13.6   HCT 39.0 - 52.0 % 36.4  34.8  38.5   Platelets 150.0 - 400.0 K/uL 325.0  256  405   NEUT# 1.4 - 7.7 K/uL 5.9   7,780   Lymph# 0.7 - 4.0 K/uL 2.1   1,866      There is no height or weight on file to calculate BMI.  Orders:  No orders of the defined types were placed in this encounter.  No orders of the defined types were placed in this encounter.    Procedures: No procedures performed  Clinical  Data: No additional findings.  ROS:  All other systems negative, except as noted in the HPI. Review of Systems  Objective: Vital Signs: There were no vitals taken for this visit.  Specialty Comments:  No specialty comments available.  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 ([place]) 01/18/2021   Osteomyelitis of third toe of left foot ([name]    Cutaneous abscess of left foot    Osteomyelitis of second toe of right foot ([place])    Poorly controlled type 2 diabetes mellitus with circulatory disorder ([place]) 07/15/2018   Numbness in both hands 07/15/2018   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   Acute coronary syndrome ([place]) 06/22/2018   Nonhealing skin ulcer ([place]) 06/22/2018   Coronary artery disease involving native coronary artery with unstable angina pectoris ([place]) 06/22/2018   Acute myocardial infarction ([place]) 06/22/2018   Hyperlipidemia    Active cochlear Meniere's disease  of left ear 09/19/2016   Vertigo 08/20/2016   Myringotomy tube status 08/20/2016   Ear fullness, left 08/20/2016   Asymmetrical left sensorineural hearing loss 02/06/2016   Neuropathy of left foot 02/03/2015   Charcot's joint of left foot, non-diabetic 02/03/2015   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   Cramping of hands 06/02/2014   Eustachian tube dysfunction 06/02/2014   HTN (hypertension) 05/07/2014   Obstructive sleep apnea 05/07/2014   Past Medical History:  Diagnosis Date   Acute myocardial infarction ([place]) 06/22/2018   Allergy    Charcot's joint of left foot, non-diabetic    Coronary artery disease involving native coronary artery with unstable angina pectoris ([place]) 06/22/2018   Diabetes ([place])    type 2   GERD (gastroesophageal reflux disease)    History of kidney stones    passed   Hyperlipidemia    Hypertension    Neuropathy of left foot    Nodular basal cell carcinoma (BCC) 07/10/2019   Right V of Neck (curet and 5FU)   Pneumonia    As a child   Right foot ulcer ([place])    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   LIMA to LAD, SVG to Diag, SVG to OM2, SVG to [name], EVH via right thigh and leg   Type II diabetes mellitus with complication, uncontrolled    Uncontrolled type 2 diabetes mellitus     Family History  Problem Relation Age of Onset   Diabetes Mother    Stroke Father    Hypertension Father    Hearing loss Father    Colon polyps Father    Colon cancer Neg Hx    Esophageal cancer Neg Hx    Prostate cancer Neg Hx     Past Surgical History:  Procedure Laterality Date   AMPUTATION Right 01/06/2020   Procedure: RIGHT FOOT 2ND RAY AMPUTATION;  Surgeon: Newt Minion, MD;  Location: [place];  Service: Orthopedics;  Laterality: Right;   AMPUTATION Left 01/11/2021   Procedure: LEFT FOOT 3RD RAY AMPUTATION;  Surgeon: Newt Minion, MD;  Location: [place];  Service: Orthopedics;  Laterality: Left;   AMPUTATION Left 12/22/2021   Procedure: LEFT TRANSMETATARSAL AMPUTATION;  Surgeon:  Newt Minion, MD;  Location: [place];  Service: Orthopedics;  Laterality: Left;   APPLICATION OF WOUND VAC  12/22/2021   Procedure: APPLICATION OF WOUND VAC;  Surgeon: Newt Minion, MD;  Location: [place];  Service: Orthopedics;;   COLONOSCOPY W/ POLYPECTOMY     CORONARY ARTERY  BYPASS GRAFT N/A 06/25/2018   Procedure: CORONARY ARTERY BYPASS GRAFTING (CABG) TIMES FOUR USING LEFT INTERNAL MAMMARY ARTERY AND RIGHT ENDOSCOPICALLY HARVESTED SAPHENOUS VEIN;  Surgeon: Rexene Alberts, MD;  Location: [place];  Service: Open Heart Surgery;  Laterality: N/A;   CYSTOSCOPY WITH STENT PLACEMENT Left 12/04/2021   Procedure: CYSTOSCOPY/RETROGRADE/ STENT PLACEMENT;  Surgeon: Vira Agar, MD;  [facility];  Service: Urology;  Laterality: Left;   CYSTOSCOPY/URETEROSCOPY/HOLMIUM LASER/STENT PLACEMENT Left 01/05/2022   Procedure: CYSTOSCOPY LEFT URETERAL STENT REMOVAL  LEFT RETROGRADE PYELOGRAM URETEROSCOPY BASKET STONE EXTRACTION STENT PLACEMENT;  Surgeon: Lucas Mallow, MD;  [facility];  Service: Urology;  Laterality: Left;  1 HR FOR CASE   ENDOVEIN HARVEST OF GREATER SAPHENOUS VEIN Right 06/25/2018   Procedure: ENDOVEIN HARVEST OF GREATER SAPHENOUS VEIN;  Surgeon: Rexene Alberts, MD;  Location: [place];  Service: Open Heart Surgery;  Laterality: Right;   EXTRACORPOREAL SHOCK WAVE LITHOTRIPSY Left 12/07/2021   Procedure: EXTRACORPOREAL SHOCK WAVE LITHOTRIPSY (ESWL);  Surgeon: Festus Aloe, MD;  Location[facility];  Service: Urology;  Laterality: Left;   LEFT HEART CATH AND CORONARY ANGIOGRAPHY N/A 06/22/2018   Procedure: LEFT HEART CATH AND CORONARY ANGIOGRAPHY;  Surgeon: Nigel Mormon, MD;  Location: [place] [facility];  Service: Cardiovascular;  Laterality: N/A;   SKIN SPLIT GRAFT Left 02/28/2022   Procedure: LEFT FOOT SKIN GRAFT;  Surgeon: Newt Minion, MD;  Location: [place];  Service: Orthopedics;  Laterality: Left;   surgical debridement  Right    Right foot ulcer    TEE WITHOUT CARDIOVERSION N/A 06/25/2018   Procedure: TRANSESOPHAGEAL ECHOCARDIOGRAM (TEE);  Surgeon: Rexene Alberts, MD;  Location: [place];  Service: Open Heart Surgery;  Laterality: N/A;   Social History   Occupational History   Occupation: attorney  Tobacco Use   Smoking status: Never   Smokeless tobacco: Never  Vaping Use   Vaping Use: Never used  Substance and Sexual Activity   Alcohol use: Not Currently    Alcohol/week: 2.0 standard drinks of alcohol    Types: 2 Cans of beer per week    Comment: Rare   Drug use: Never   Sexual activity: Not on file

## 2022-04-02 ENCOUNTER — Other Ambulatory Visit (HOSPITAL_COMMUNITY): Payer: Self-pay

## 2022-04-03 ENCOUNTER — Other Ambulatory Visit (HOSPITAL_COMMUNITY): Payer: Self-pay

## 2022-04-09 ENCOUNTER — Other Ambulatory Visit (HOSPITAL_COMMUNITY): Payer: Self-pay

## 2022-04-10 ENCOUNTER — Telehealth: Payer: Self-pay | Admitting: Orthopedic Surgery

## 2022-04-10 NOTE — Telephone Encounter (Signed)
Uploaded info to kerecis BI portal.

## 2022-04-10 NOTE — Telephone Encounter (Signed)
Lousie from [place] for pt's disability called requesting we send pt's medical insurance member ID front and back of card. Please fax to [phone].

## 2022-04-12 ENCOUNTER — Encounter: Payer: Self-pay | Admitting: Orthopedic Surgery

## 2022-04-12 ENCOUNTER — Ambulatory Visit (INDEPENDENT_AMBULATORY_CARE_PROVIDER_SITE_OTHER): Payer: 59 | Admitting: Orthopedic Surgery

## 2022-04-12 ENCOUNTER — Other Ambulatory Visit (HOSPITAL_BASED_OUTPATIENT_CLINIC_OR_DEPARTMENT_OTHER): Payer: Self-pay

## 2022-04-12 DIAGNOSIS — Z89432 Acquired absence of left foot: Secondary | ICD-10-CM

## 2022-04-12 DIAGNOSIS — Z9889 Other specified postprocedural states: Secondary | ICD-10-CM

## 2022-04-12 DIAGNOSIS — L97521 Non-pressure chronic ulcer of other part of left foot limited to breakdown of skin: Secondary | ICD-10-CM

## 2022-04-12 MED ORDER — COLLAGENASE 250 UNIT/GM EX OINT
1.0000 | TOPICAL_OINTMENT | Freq: Every day | CUTANEOUS | 3 refills | Status: DC
  Filled 2022-04-12: qty 30, 30d supply, fill #0
  Filled 2022-05-18: qty 30, 30d supply, fill #1

## 2022-04-12 NOTE — Progress Notes (Signed)
Office Visit Note   [name]           Date of Birth: [birth year]           MRN: [id number] Visit Date: 04/12/2022              Requested by: Mackie Pai, PA-C [place],  [place] [address] PCP: Mackie Pai, PA-C  Chief Complaint  Patient presents with   Left Foot - Follow-up      HPI: Patient is a 61 year old gentleman who is status post transmetatarsal amputation of the left and most recently skin graft approximately 5 weeks ago.  Assessment & Plan: Visit Diagnoses:  1. History of transmetatarsal amputation of left foot ([place])   2. History of artificial skin graft   3. Non-pressure chronic ulcer of other part of left foot limited to breakdown of skin ([place])     Plan: Discussed with the patient the healing is definitely related to microcirculation he has excellent circulation to the ankle.  Patient states he would like to continue with foot salvage intervention.  We will start him on Santyl dressing changes daily reevaluate in 2 weeks.  Follow-Up Instructions: Return in about 2 weeks (around 04/26/2022).   Ortho Exam  Patient is alert, oriented, no adenopathy, well-dressed, normal affect, normal respiratory effort. Examination patient has a palpable dorsalis pedis pulse the Doppler was used and he has a strong biphasic pulse.  Patient has a plantar ulcer that measures 11 x 9 cm there is no interval healing after the skin graft.  There is no cellulitis no odor no drainage.  The wound bed has healthy granulation tissue.  He has good hair growth to the ankle.  Imaging: No results found.     Labs: Lab Results  Component Value Date   HGBA1C 6.2 (H) 01/04/2022   HGBA1C 7.3 (A) 06/14/2021   HGBA1C 7.4 (A) 01/27/2021   ESRSEDRATE 36 (H) 02/12/2022   REPTSTATUS 12/27/2021 FINAL 12/22/2021   GRAMSTAIN  12/22/2021    RARE WBC PRESENT,BOTH PMN AND MONONUCLEAR NO ORGANISMS SEEN    CULT  12/22/2021    RARE METHICILLIN RESISTANT  STAPHYLOCOCCUS AUREUS NO ANAEROBES ISOLATED Performed at [facility], [place] [address] [place], [place] [address]    LABORGA METHICILLIN RESISTANT STAPHYLOCOCCUS AUREUS 12/22/2021     Lab Results  Component Value Date   ALBUMIN 3.8 02/12/2022   ALBUMIN 4.3 05/01/2021   ALBUMIN 3.9 05/08/2019    Lab Results  Component Value Date   MG 2.1 05/08/2019   MG 2.2 06/26/2018   MG 3.0 (H) 06/26/2018   No results found for: "VD25OH"  No results found for: "PREALBUMIN"    Latest Ref Rng & Units 02/12/2022    3:04 PM 12/22/2021    5:56 AM 12/01/2021    3:48 PM  CBC EXTENDED  WBC 4.0 - 10.5 K/uL 9.0  7.7  10.6   RBC 4.22 - 5.81 Mil/uL 3.97  3.76  4.27   Hemoglobin 13.0 - 17.0 g/dL 12.0  11.9  13.6   HCT 39.0 - 52.0 % 36.4  34.8  38.5   Platelets 150.0 - 400.0 K/uL 325.0  256  405   NEUT# 1.4 - 7.7 K/uL 5.9   7,780   Lymph# 0.7 - 4.0 K/uL 2.1   1,866      There is no height or weight on file to calculate BMI.  Orders:  No orders of the defined types were  placed in this encounter.  Meds ordered this encounter  Medications   collagenase (SANTYL) 250 UNIT/GM ointment    Sig: Apply to the affected area daily plus dry dressing as directed    Dispense:  90 g    Refill:  3     Procedures: No procedures performed  Clinical Data: No additional findings.  ROS:  All other systems negative, except as noted in the HPI. Review of Systems  Objective: Vital Signs: There were no vitals taken for this visit.  Specialty Comments:  No specialty comments available.  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 ([place]) 01/18/2021   Osteomyelitis of third toe of left foot ([name]    Cutaneous abscess of left foot    Osteomyelitis of second toe of right foot ([place])    Poorly controlled type 2 diabetes mellitus with circulatory disorder ([place]) 07/15/2018   Numbness in both hands 07/15/2018   S/P CABG  x 4 06/25/2018   Acute coronary syndrome ([place]) 06/22/2018   Nonhealing skin ulcer ([place]) 06/22/2018   Coronary artery disease involving native coronary artery with unstable angina pectoris ([place]) 06/22/2018   Acute myocardial infarction ([place]) 06/22/2018   Hyperlipidemia    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   Vertigo 08/20/2016   Myringotomy tube status 08/20/2016   Ear fullness, left 08/20/2016   Asymmetrical left sensorineural hearing loss 02/06/2016   Neuropathy of left foot 02/03/2015   Charcot's joint of left foot, non-diabetic 02/03/2015   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   Cramping of hands 06/02/2014   Eustachian tube dysfunction 06/02/2014   HTN (hypertension) 05/07/2014   Obstructive sleep apnea 05/07/2014   Past Medical History:  Diagnosis Date   Acute myocardial infarction ([place]) 06/22/2018   Allergy    Charcot's joint of left foot, non-diabetic    Coronary artery disease involving native coronary artery with unstable angina pectoris ([place]) 06/22/2018   Diabetes ([place])    type 2   GERD (gastroesophageal reflux disease)    History of kidney stones    passed   Hyperlipidemia    Hypertension    Neuropathy of left foot    Nodular basal cell carcinoma (BCC) 07/10/2019   Right V of Neck (curet and 5FU)   Pneumonia    As a child   Right foot ulcer ([place])    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   LIMA to LAD, SVG to Diag, SVG to OM2, SVG to [name], EVH via right thigh and leg   Type II diabetes mellitus with complication, uncontrolled    Uncontrolled type 2 diabetes mellitus     Family History  Problem Relation Age of Onset   Diabetes Mother    Stroke Father    Hypertension Father    Hearing loss Father    Colon polyps Father    Colon cancer Neg Hx    Esophageal cancer Neg Hx    Prostate cancer Neg Hx     Past Surgical History:  Procedure Laterality Date   AMPUTATION Right 01/06/2020   Procedure: RIGHT FOOT 2ND RAY AMPUTATION;  Surgeon: Newt Minion, MD;   Location: [facility];  Service: Orthopedics;  Laterality: Right;   AMPUTATION Left 01/11/2021   Procedure: LEFT FOOT 3RD RAY AMPUTATION;  Surgeon: Newt Minion, MD;  Location: [facility];  Service: Orthopedics;  Laterality: Left;   AMPUTATION Left 12/22/2021   Procedure: LEFT TRANSMETATARSAL  AMPUTATION;  Surgeon: Newt Minion, MD;  Location: [facility];  Service: Orthopedics;  Laterality: Left;   APPLICATION OF WOUND VAC  12/22/2021   Procedure: APPLICATION OF WOUND VAC;  Surgeon: Newt Minion, MD;  Location: [facility];  Service: Orthopedics;;   COLONOSCOPY W/ POLYPECTOMY     CORONARY ARTERY BYPASS GRAFT N/A 06/25/2018   Procedure: CORONARY ARTERY BYPASS GRAFTING (CABG) TIMES FOUR USING LEFT INTERNAL MAMMARY ARTERY AND RIGHT ENDOSCOPICALLY HARVESTED SAPHENOUS VEIN;  Surgeon: Rexene Alberts, MD;  Location: [facility];  Service: Open Heart Surgery;  Laterality: N/A;   CYSTOSCOPY WITH STENT PLACEMENT Left 12/04/2021   Procedure: CYSTOSCOPY/RETROGRADE/ STENT PLACEMENT;  Surgeon: Vira Agar, MD;  Location: [facility];  Service: Urology;  Laterality: Left;   CYSTOSCOPY/URETEROSCOPY/HOLMIUM LASER/STENT PLACEMENT Left 01/05/2022   Procedure: CYSTOSCOPY LEFT URETERAL STENT REMOVAL  LEFT RETROGRADE PYELOGRAM URETEROSCOPY BASKET STONE EXTRACTION STENT PLACEMENT;  Surgeon: Lucas Mallow, MD;  Location[facility];  Service: Urology;  Laterality: Left;  1 HR FOR CASE   ENDOVEIN HARVEST OF GREATER SAPHENOUS VEIN Right 06/25/2018   Procedure: ENDOVEIN HARVEST OF GREATER SAPHENOUS VEIN;  Surgeon: Rexene Alberts, MD;  Location: [place];  Service: Open Heart Surgery;  Laterality: Right;   EXTRACORPOREAL SHOCK WAVE LITHOTRIPSY Left 12/07/2021   Procedure: EXTRACORPOREAL SHOCK WAVE LITHOTRIPSY (ESWL);  Surgeon: Festus Aloe, MD;  Location: [facility];  Service: Urology;  Laterality: Left;   LEFT HEART CATH AND CORONARY ANGIOGRAPHY N/A 06/22/2018   Procedure: LEFT HEART CATH AND CORONARY ANGIOGRAPHY;  Surgeon:  Nigel Mormon, MD;  Location: [facility];  Service: Cardiovascular;  Laterality: N/A;   SKIN SPLIT GRAFT Left 02/28/2022   Procedure: LEFT FOOT SKIN GRAFT;  Surgeon: Newt Minion, MD;  Location: [facility];  Service: Orthopedics;  Laterality: Left;   surgical debridement  Right    Right foot ulcer   TEE WITHOUT CARDIOVERSION N/A 06/25/2018   Procedure: TRANSESOPHAGEAL ECHOCARDIOGRAM (TEE);  Surgeon: Rexene Alberts, MD;  Location: [facility];  Service: Open Heart Surgery;  Laterality: N/A;   Social History   Occupational History   Occupation: attorney  Tobacco Use   Smoking status: Never   Smokeless tobacco: Never  Vaping Use   Vaping Use: Never used  Substance and Sexual Activity   Alcohol use: Not Currently    Alcohol/week: 2.0 standard drinks of alcohol    Types: 2 Cans of beer per week    Comment: Rare   Drug use: Never   Sexual activity: Not on file

## 2022-04-13 ENCOUNTER — Other Ambulatory Visit (HOSPITAL_BASED_OUTPATIENT_CLINIC_OR_DEPARTMENT_OTHER): Payer: Self-pay

## 2022-04-13 ENCOUNTER — Other Ambulatory Visit: Payer: Self-pay | Admitting: Medical

## 2022-04-17 ENCOUNTER — Other Ambulatory Visit: Payer: Self-pay | Admitting: Cardiovascular Disease

## 2022-04-17 ENCOUNTER — Other Ambulatory Visit (HOSPITAL_BASED_OUTPATIENT_CLINIC_OR_DEPARTMENT_OTHER): Payer: Self-pay

## 2022-04-17 MED ORDER — ATORVASTATIN CALCIUM 80 MG PO TABS
80.0000 mg | ORAL_TABLET | Freq: Every day | ORAL | 2 refills | Status: DC
  Filled 2022-04-17: qty 90, 90d supply, fill #0
  Filled 2022-07-15: qty 90, 90d supply, fill #1
  Filled 2022-10-15: qty 90, 90d supply, fill #2

## 2022-04-18 ENCOUNTER — Telehealth: Payer: Self-pay

## 2022-04-18 ENCOUNTER — Other Ambulatory Visit (HOSPITAL_BASED_OUTPATIENT_CLINIC_OR_DEPARTMENT_OTHER): Payer: Self-pay

## 2022-04-18 ENCOUNTER — Other Ambulatory Visit (HOSPITAL_COMMUNITY): Payer: Self-pay

## 2022-04-18 NOTE — Telephone Encounter (Signed)
Prior auth for Ray entered through cover my meds. Will hold this message pending decision

## 2022-04-19 ENCOUNTER — Other Ambulatory Visit (HOSPITAL_BASED_OUTPATIENT_CLINIC_OR_DEPARTMENT_OTHER): Payer: Self-pay

## 2022-04-19 NOTE — Telephone Encounter (Signed)
Checked the cover my meds portal and this medication has been approved.

## 2022-04-20 ENCOUNTER — Other Ambulatory Visit (HOSPITAL_BASED_OUTPATIENT_CLINIC_OR_DEPARTMENT_OTHER): Payer: Self-pay

## 2022-04-30 ENCOUNTER — Ambulatory Visit (INDEPENDENT_AMBULATORY_CARE_PROVIDER_SITE_OTHER): Payer: 59 | Admitting: Orthopedic Surgery

## 2022-04-30 ENCOUNTER — Encounter: Payer: Self-pay | Admitting: Orthopedic Surgery

## 2022-04-30 DIAGNOSIS — Z89432 Acquired absence of left foot: Secondary | ICD-10-CM

## 2022-04-30 DIAGNOSIS — Z9889 Other specified postprocedural states: Secondary | ICD-10-CM

## 2022-04-30 DIAGNOSIS — L97521 Non-pressure chronic ulcer of other part of left foot limited to breakdown of skin: Secondary | ICD-10-CM

## 2022-04-30 NOTE — Progress Notes (Signed)
Office Visit Note   [name]           Date of Birth: Nov 24, 1960           MRN: [id number] Visit Date: 04/30/2022              Requested by: Mackie Pai, PA-C [place],  [place] [address] PCP: Mackie Pai, PA-C  Chief Complaint  Patient presents with   Left Foot - Routine Post Op    Left transmet amputation 02/28/22 left foot kerecis graft       HPI: Patient is a 61 year old gentleman who is 2 months status post application of Kerecis tissue graft.  Further in office application was denied by insurance.  Assessment & Plan: Visit Diagnoses:  1. History of transmetatarsal amputation of left foot ([place])   2. History of artificial skin graft   3. Non-pressure chronic ulcer of other part of left foot limited to breakdown of skin ([place])     Plan: Continue with his routine wound care nonweightbearing with crutches.  Patient states he would like to follow-up in 3 to 4 weeks.  Follow-Up Instructions: Return in about 4 weeks (around 05/28/2022).   Ortho Exam  Patient is alert, oriented, no adenopathy, well-dressed, normal affect, normal respiratory effort. Examination patient has flat clip granulation tissue 9 cm in diameter over the plantar and dorsal aspect of his transmetatarsal amputation the swelling and brawny edema is improving there is no tunneling no cellulitis no odor no drainage.  Discussed options with return to the operating room for [facility] tissue graft versus transtibial amputation.  Patient wants to hold on any type of surgery at this time.  Discussed that the healing is a microcirculatory problem he has a good palpable pulse at the ankle.  Imaging: No results found. No images are attached to the encounter.  Labs: Lab Results  Component Value Date   HGBA1C 6.2 (H) 01/04/2022   HGBA1C 7.3 (A) 06/14/2021   HGBA1C 7.4 (A) 01/27/2021   ESRSEDRATE 36 (H) 02/12/2022   REPTSTATUS 12/27/2021 FINAL 12/22/2021   GRAMSTAIN   12/22/2021    RARE WBC PRESENT,BOTH PMN AND MONONUCLEAR NO ORGANISMS SEEN    CULT  12/22/2021    RARE METHICILLIN RESISTANT STAPHYLOCOCCUS AUREUS NO ANAEROBES ISOLATED Performed at [facility], [place] [address] [place], [place] [address]    LABORGA METHICILLIN RESISTANT STAPHYLOCOCCUS AUREUS 12/22/2021     Lab Results  Component Value Date   ALBUMIN 3.8 02/12/2022   ALBUMIN 4.3 05/01/2021   ALBUMIN 3.9 05/08/2019    Lab Results  Component Value Date   MG 2.1 05/08/2019   MG 2.2 06/26/2018   MG 3.0 (H) 06/26/2018   No results found for: "VD25OH"  No results found for: "PREALBUMIN"    Latest Ref Rng & Units 02/12/2022    3:04 PM 12/22/2021    5:56 AM 12/01/2021    3:48 PM  CBC EXTENDED  WBC 4.0 - 10.5 K/uL 9.0  7.7  10.6   RBC 4.22 - 5.81 Mil/uL 3.97  3.76  4.27   Hemoglobin 13.0 - 17.0 g/dL 12.0  11.9  13.6   HCT 39.0 - 52.0 % 36.4  34.8  38.5   Platelets 150.0 - 400.0 K/uL 325.0  256  405   NEUT# 1.4 - 7.7 K/uL 5.9   7,780   Lymph# 0.7 - 4.0 K/uL 2.1   1,866      There is no height  or weight on file to calculate BMI.  Orders:  No orders of the defined types were placed in this encounter.  No orders of the defined types were placed in this encounter.    Procedures: No procedures performed  Clinical Data: No additional findings.  ROS:  All other systems negative, except as noted in the HPI. Review of Systems  Objective: Vital Signs: There were no vitals taken for this visit.  Specialty Comments:  No specialty comments available.  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 ([place]) 01/18/2021   Osteomyelitis of third toe of left foot ([name]    Cutaneous abscess of left foot    Osteomyelitis of second toe of right foot ([place])    Poorly controlled type 2 diabetes mellitus with circulatory disorder ([place]) 07/15/2018   Numbness in both hands 07/15/2018   S/P CABG x 4  06/25/2018   Acute coronary syndrome ([place]) 06/22/2018   Nonhealing skin ulcer ([place]) 06/22/2018   Coronary artery disease involving native coronary artery with unstable angina pectoris ([place]) 06/22/2018   Acute myocardial infarction ([place]) 06/22/2018   Hyperlipidemia    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   Vertigo 08/20/2016   Myringotomy tube status 08/20/2016   Ear fullness, left 08/20/2016   Asymmetrical left sensorineural hearing loss 02/06/2016   Neuropathy of left foot 02/03/2015   Charcot's joint of left foot, non-diabetic 02/03/2015   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   Cramping of hands 06/02/2014   Eustachian tube dysfunction 06/02/2014   HTN (hypertension) 05/07/2014   Obstructive sleep apnea 05/07/2014   Past Medical History:  Diagnosis Date   Acute myocardial infarction ([place]) 06/22/2018   Allergy    Charcot's joint of left foot, non-diabetic    Coronary artery disease involving native coronary artery with unstable angina pectoris ([place]) 06/22/2018   Diabetes ([place])    type 2   GERD (gastroesophageal reflux disease)    History of kidney stones    passed   Hyperlipidemia    Hypertension    Neuropathy of left foot    Nodular basal cell carcinoma (BCC) 07/10/2019   Right V of Neck (curet and 5FU)   Pneumonia    As a child   Right foot ulcer ([place])    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   LIMA to LAD, SVG to Diag, SVG to OM2, SVG to [name], EVH via right thigh and leg   Type II diabetes mellitus with complication, uncontrolled    Uncontrolled type 2 diabetes mellitus     Family History  Problem Relation Age of Onset   Diabetes Mother    Stroke Father    Hypertension Father    Hearing loss Father    Colon polyps Father    Colon cancer Neg Hx    Esophageal cancer Neg Hx    Prostate cancer Neg Hx     Past Surgical History:  Procedure Laterality Date   AMPUTATION Right 01/06/2020   Procedure: RIGHT FOOT 2ND RAY AMPUTATION;  Surgeon: Newt Minion, MD;   Location: [facility];  Service: Orthopedics;  Laterality: Right;   AMPUTATION Left 01/11/2021   Procedure: LEFT FOOT 3RD RAY AMPUTATION;  Surgeon: Newt Minion, MD;  Location: [facility];  Service: Orthopedics;  Laterality: Left;   AMPUTATION Left 12/22/2021   Procedure: LEFT TRANSMETATARSAL AMPUTATION;  Surgeon: Newt Minion, MD;  Location: [facility];  Service: Orthopedics;  Laterality: Left;   APPLICATION OF WOUND VAC  12/22/2021   Procedure: APPLICATION OF WOUND VAC;  Surgeon: Newt Minion, MD;  Location: [facility];  Service: Orthopedics;;   COLONOSCOPY W/ POLYPECTOMY     CORONARY ARTERY BYPASS GRAFT N/A 06/25/2018   Procedure: CORONARY ARTERY BYPASS GRAFTING (CABG) TIMES FOUR USING LEFT INTERNAL MAMMARY ARTERY AND RIGHT ENDOSCOPICALLY HARVESTED SAPHENOUS VEIN;  Surgeon: Rexene Alberts, MD;  Location: [facility];  Service: Open Heart Surgery;  Laterality: N/A;   CYSTOSCOPY WITH STENT PLACEMENT Left 12/04/2021   Procedure: CYSTOSCOPY/RETROGRADE/ STENT PLACEMENT;  Surgeon: Vira Agar, MD;  [facility];  Service: Urology;  Laterality: Left;   CYSTOSCOPY/URETEROSCOPY/HOLMIUM LASER/STENT PLACEMENT Left 01/05/2022   Procedure: CYSTOSCOPY LEFT URETERAL STENT REMOVAL  LEFT RETROGRADE PYELOGRAM URETEROSCOPY BASKET STONE EXTRACTION STENT PLACEMENT;  Surgeon: Lucas Mallow, MD;  [facility];  Service: Urology;  Laterality: Left;  1 HR FOR CASE   ENDOVEIN HARVEST OF GREATER SAPHENOUS VEIN Right 06/25/2018   Procedure: ENDOVEIN HARVEST OF GREATER SAPHENOUS VEIN;  Surgeon: Rexene Alberts, MD;  Location: [facility];  Service: Open Heart Surgery;  Laterality: Right;   EXTRACORPOREAL SHOCK WAVE LITHOTRIPSY Left 12/07/2021   Procedure: EXTRACORPOREAL SHOCK WAVE LITHOTRIPSY (ESWL);  Surgeon: Festus Aloe, MD;  Location: [facility];  Service: Urology;  Laterality: Left;   LEFT HEART CATH AND CORONARY ANGIOGRAPHY N/A 06/22/2018   Procedure: LEFT HEART CATH AND CORONARY ANGIOGRAPHY;  Surgeon:  Nigel Mormon, MD;  Location: [facility];  Service: Cardiovascular;  Laterality: N/A;   SKIN SPLIT GRAFT Left 02/28/2022   Procedure: LEFT FOOT SKIN GRAFT;  Surgeon: Newt Minion, MD;  Location: [facility];  Service: Orthopedics;  Laterality: Left;   surgical debridement  Right    Right foot ulcer   TEE WITHOUT CARDIOVERSION N/A 06/25/2018   Procedure: TRANSESOPHAGEAL ECHOCARDIOGRAM (TEE);  Surgeon: Rexene Alberts, MD;  Location: [facility];  Service: Open Heart Surgery;  Laterality: N/A;   Social History   Occupational History   Occupation: attorney  Tobacco Use   Smoking status: Never   Smokeless tobacco: Never  Vaping Use   Vaping Use: Never used  Substance and Sexual Activity   Alcohol use: Not Currently    Alcohol/week: 2.0 standard drinks of alcohol    Types: 2 Cans of beer per week    Comment: Rare   Drug use: Never   Sexual activity: Not on file

## 2022-05-18 ENCOUNTER — Other Ambulatory Visit (HOSPITAL_BASED_OUTPATIENT_CLINIC_OR_DEPARTMENT_OTHER): Payer: Self-pay

## 2022-05-18 ENCOUNTER — Other Ambulatory Visit: Payer: Self-pay | Admitting: Medical

## 2022-05-18 MED ORDER — SILDENAFIL CITRATE 50 MG PO TABS
50.0000 mg | ORAL_TABLET | Freq: Every day | ORAL | 0 refills | Status: DC | PRN
  Filled 2022-05-18: qty 6, 30d supply, fill #0
  Filled 2022-08-01 – 2023-01-08 (×2): qty 4, 20d supply, fill #1

## 2022-05-21 ENCOUNTER — Other Ambulatory Visit (HOSPITAL_BASED_OUTPATIENT_CLINIC_OR_DEPARTMENT_OTHER): Payer: Self-pay

## 2022-05-21 MED ORDER — CO Q-10 100 MG PO CAPS
300.0000 mg | ORAL_CAPSULE | Freq: Every morning | ORAL | 0 refills | Status: DC
  Filled 2022-05-21: qty 270, 90d supply, fill #0

## 2022-05-22 ENCOUNTER — Other Ambulatory Visit (HOSPITAL_BASED_OUTPATIENT_CLINIC_OR_DEPARTMENT_OTHER): Payer: Self-pay

## 2022-05-24 ENCOUNTER — Ambulatory Visit (INDEPENDENT_AMBULATORY_CARE_PROVIDER_SITE_OTHER): Payer: 59 | Admitting: Orthopedic Surgery

## 2022-05-24 ENCOUNTER — Other Ambulatory Visit (HOSPITAL_COMMUNITY): Payer: Self-pay

## 2022-05-24 ENCOUNTER — Other Ambulatory Visit: Payer: Self-pay

## 2022-05-24 ENCOUNTER — Encounter: Payer: Self-pay | Admitting: Orthopedic Surgery

## 2022-05-24 ENCOUNTER — Encounter (HOSPITAL_COMMUNITY): Payer: Self-pay | Admitting: Orthopedic Surgery

## 2022-05-24 DIAGNOSIS — L97521 Non-pressure chronic ulcer of other part of left foot limited to breakdown of skin: Secondary | ICD-10-CM

## 2022-05-24 DIAGNOSIS — Z9889 Other specified postprocedural states: Secondary | ICD-10-CM

## 2022-05-24 DIAGNOSIS — Z89432 Acquired absence of left foot: Secondary | ICD-10-CM

## 2022-05-24 MED ORDER — ALPRAZOLAM 0.5 MG PO TABS
0.5000 mg | ORAL_TABLET | Freq: Three times a day (TID) | ORAL | 1 refills | Status: DC | PRN
  Filled 2022-05-24: qty 30, 10d supply, fill #0
  Filled 2022-07-02: qty 30, 10d supply, fill #1

## 2022-05-24 NOTE — Progress Notes (Signed)
Office Visit Note   [name]           Date of Birth: 1961-01-01           MRN: [id number] Visit Date: 05/24/2022              Requested by: Mackie Pai, PA-C [place],  [place] [address] PCP: Mackie Pai, PA-C  Chief Complaint  Patient presents with   Left Foot - Routine Post Op    Left transmet amputation 02/28/22 left foot kerecis graft      HPI: Patient is a 61 year old gentleman status post transmetatarsal amputation in May and status post Kerecis tissue graft in August.  Patient still has a nonhealing wound involving the entire forefoot.  Patient's insurance denied in the office application of the Kerecis tissue graft.  Assessment & Plan: Visit Diagnoses:  1. History of transmetatarsal amputation of left foot ([place])   2. History of artificial skin graft   3. Non-pressure chronic ulcer of other part of left foot limited to breakdown of skin ([employer])     Plan: With the large nonhealing ulcer and no options available for office management we will plan to proceed with a show part amputation.  A prescription was provided for Xanax for his nerves.  Plan for outpatient surgery tomorrow.  Risks and benefits of surgery were discussed.  Follow-Up Instructions: Return in about 2 weeks (around 06/07/2022).   Ortho Exam  Patient is alert, oriented, no adenopathy, well-dressed, normal affect, normal respiratory effort. Examination patient has good hair growth to the ankle.  He has a palpable dorsalis pedis pulse he has a large nonhealing ulcer of the left foot there is no exposed bone no extending cellulitis.  Doppler shows a strong biphasic dorsalis pedis pulse.  Ulcer is 11 x 9 cm.  With the size of the ulcer and lack of progression of wound care I have recommended proceeding with further foot salvage surgery with a Chopart amputation versus transtibial amputation.  Patient would like to proceed with further foot salvage  intervention.  Imaging: No results found.    Labs: Lab Results  Component Value Date   HGBA1C 6.2 (H) 01/04/2022   HGBA1C 7.3 (A) 06/14/2021   HGBA1C 7.4 (A) 01/27/2021   ESRSEDRATE 36 (H) 02/12/2022   REPTSTATUS 12/27/2021 FINAL 12/22/2021   GRAMSTAIN  12/22/2021    RARE WBC PRESENT,BOTH PMN AND MONONUCLEAR NO ORGANISMS SEEN    CULT  12/22/2021    RARE METHICILLIN RESISTANT STAPHYLOCOCCUS AUREUS NO ANAEROBES ISOLATED Performed at [facility], [place] [address] [place], [place] [address]    LABORGA METHICILLIN RESISTANT STAPHYLOCOCCUS AUREUS 12/22/2021     Lab Results  Component Value Date   ALBUMIN 3.8 02/12/2022   ALBUMIN 4.3 05/01/2021   ALBUMIN 3.9 05/08/2019    Lab Results  Component Value Date   MG 2.1 05/08/2019   MG 2.2 06/26/2018   MG 3.0 (H) 06/26/2018   No results found for: "VD25OH"  No results found for: "PREALBUMIN"    Latest Ref Rng & Units 02/12/2022    3:04 PM 12/22/2021    5:56 AM 12/01/2021    3:48 PM  CBC EXTENDED  WBC 4.0 - 10.5 K/uL 9.0  7.7  10.6   RBC 4.22 - 5.81 Mil/uL 3.97  3.76  4.27   Hemoglobin 13.0 - 17.0 g/dL 12.0  11.9  13.6   HCT 39.0 - 52.0 % 36.4  34.8  38.5  Platelets 150.0 - 400.0 K/uL 325.0  256  405   NEUT# 1.4 - 7.7 K/uL 5.9   7,780   Lymph# 0.7 - 4.0 K/uL 2.1   1,866      There is no height or weight on file to calculate BMI.  Orders:  No orders of the defined types were placed in this encounter.  Meds ordered this encounter  Medications   ALPRAZolam (XANAX) 0.5 MG tablet    Sig: Take 1 tablet (0.5 mg total) by mouth 3 (three) times daily as needed for anxiety.    Dispense:  30 tablet    Refill:  1     Procedures: No procedures performed  Clinical Data: No additional findings.  ROS:  All other systems negative, except as noted in the HPI. Review of Systems  Objective: Vital Signs: There were no vitals taken for this visit.  Specialty Comments:  No specialty comments available.  PMFS  History: Patient Active Problem List   Diagnosis Date Noted   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 ([place]) 01/18/2021   Osteomyelitis of third toe of left foot ([name]    Cutaneous abscess of left foot    Osteomyelitis of second toe of right foot ([place])    Poorly controlled type 2 diabetes mellitus with circulatory disorder ([place]) 07/15/2018   Numbness in both hands 07/15/2018   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   Acute coronary syndrome ([place]) 06/22/2018   Nonhealing skin ulcer ([place]) 06/22/2018   Coronary artery disease involving native coronary artery with unstable angina pectoris ([place]) 06/22/2018   Acute myocardial infarction ([place]) 06/22/2018   Hyperlipidemia    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   Vertigo 08/20/2016   Myringotomy tube status 08/20/2016   Ear fullness, left 08/20/2016   Asymmetrical left sensorineural hearing loss 02/06/2016   Neuropathy of left foot 02/03/2015   Charcot's joint of left foot, non-diabetic 02/03/2015   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   Cramping of hands 06/02/2014   Eustachian tube dysfunction 06/02/2014   HTN (hypertension) 05/07/2014   Obstructive sleep apnea 05/07/2014   Past Medical History:  Diagnosis Date   Acute myocardial infarction ([place]) 06/22/2018   Allergy    Charcot's joint of left foot, non-diabetic    Coronary artery disease involving native coronary artery with unstable angina pectoris ([place]) 06/22/2018   Diabetes ([place])    type 2   GERD (gastroesophageal reflux disease)    History of kidney stones    passed   Hyperlipidemia    Hypertension    Neuropathy of left foot    Nodular basal cell carcinoma (B[facility]) 07/10/2019   Right V of Neck (curet and 5FU)   Pneumonia    As a child   Right foot ulcer ([place])    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   LIMA to LAD, SVG to Diag, SVG to OM2, SVG to [name], EVH via right thigh and leg   Type II diabetes mellitus with complication, uncontrolled    Uncontrolled type 2  diabetes mellitus     Family History  Problem Relation Age of Onset   Diabetes Mother    Stroke Father    Hypertension Father    Hearing loss Father    Colon polyps Father    Colon cancer Neg Hx    Esophageal cancer Neg Hx    Prostate cancer Neg Hx     Past Surgical History:  Procedure Laterality Date   AMPUTATION Right 01/06/2020  Procedure: RIGHT FOOT 2ND RAY AMPUTATION;  Surgeon: Newt Minion, MD;  Location: [place];  Service: Orthopedics;  Laterality: Right;   AMPUTATION Left 01/11/2021   Procedure: LEFT FOOT 3RD RAY AMPUTATION;  Surgeon: Newt Minion, MD;  Location: [place];  Service: Orthopedics;  Laterality: Left;   AMPUTATION Left 12/22/2021   Procedure: LEFT TRANSMETATARSAL AMPUTATION;  Surgeon: Newt Minion, MD;  Location: [place];  Service: Orthopedics;  Laterality: Left;   APPLICATION OF WOUND VAC  12/22/2021   Procedure: APPLICATION OF WOUND VAC;  Surgeon: Newt Minion, MD;  Location: [place];  Service: Orthopedics;;   COLONOSCOPY W/ POLYPECTOMY     CORONARY ARTERY BYPASS GRAFT N/A 06/25/2018   Procedure: CORONARY ARTERY BYPASS GRAFTING (CABG) TIMES FOUR USING LEFT INTERNAL MAMMARY ARTERY AND RIGHT ENDOSCOPICALLY HARVESTED SAPHENOUS VEIN;  Surgeon: Rexene Alberts, MD;  Location: [place];  Service: Open Heart Surgery;  Laterality: N/A;   CYSTOSCOPY WITH STENT PLACEMENT Left 12/04/2021   Procedure: CYSTOSCOPY/RETROGRADE/ STENT PLACEMENT;  Surgeon: Vira Agar, MD;  Location: [facility];  Service: Urology;  Laterality: Left;   CYSTOSCOPY/URETEROSCOPY/HOLMIUM LASER/STENT PLACEMENT Left 01/05/2022   Procedure: CYSTOSCOPY LEFT URETERAL STENT REMOVAL  LEFT RETROGRADE PYELOGRAM URETEROSCOPY BASKET STONE EXTRACTION STENT PLACEMENT;  Surgeon: Lucas Mallow, MD;  Location: [facility];  Service: Urology;  Laterality: Left;  1 HR FOR CASE   ENDOVEIN HARVEST OF GREATER SAPHENOUS VEIN Right 06/25/2018   Procedure: ENDOVEIN HARVEST OF GREATER SAPHENOUS VEIN;  Surgeon: Rexene Alberts, MD;   Location: [place];  Service: Open Heart Surgery;  Laterality: Right;   EXTRACORPOREAL SHOCK WAVE LITHOTRIPSY Left 12/07/2021   Procedure: EXTRACORPOREAL SHOCK WAVE LITHOTRIPSY (ESWL);  Surgeon: Festus Aloe, MD;  Location[facility];  Service: Urology;  Laterality: Left;   LEFT HEART CATH AND CORONARY ANGIOGRAPHY N/A 06/22/2018   Procedure: LEFT HEART CATH AND CORONARY ANGIOGRAPHY;  Surgeon: Nigel Mormon, MD;  Location: [facility];  Service: Cardiovascular;  Laterality: N/A;   SKIN SPLIT GRAFT Left 02/28/2022   Procedure: LEFT FOOT SKIN GRAFT;  Surgeon: Newt Minion, MD;  Location: [place];  Service: Orthopedics;  Laterality: Left;   surgical debridement  Right    Right foot ulcer   TEE WITHOUT CARDIOVERSION N/A 06/25/2018   Procedure: TRANSESOPHAGEAL ECHOCARDIOGRAM (TEE);  Surgeon: Rexene Alberts, MD;  Location: [place];  Service: Open Heart Surgery;  Laterality: N/A;   Social History   Occupational History   Occupation: attorney  Tobacco Use   Smoking status: Never   Smokeless tobacco: Never  Vaping Use   Vaping Use: Never used  Substance and Sexual Activity   Alcohol use: Not Currently    Alcohol/week: 2.0 standard drinks of alcohol    Types: 2 Cans of beer per week    Comment: Rare   Drug use: Never   Sexual activity: Not on file

## 2022-05-24 NOTE — Progress Notes (Signed)
Anesthesia Chart Review: Same-day work-up  Follows with cardiology for hx of CAD s/p CABG x4 2019 (LIMA-LAD, SVG-[name], SVG-OM2, SVG-DIAG), HTN, OSA.  Patient has had multiple recent surgeries related to nonhealing foot ulcer.  He was cleared by cardiology prior to undergoing procedure in June 2023.  Per note by Ambrose Pancoast, NP on 12/29/2021, "Chart reviewed as part of pre-operative protocol coverage. Given past medical history and time since last visit, based on ACC/AHA guidelines, LOMAX POEHLER would be at acceptable risk for the planned procedure without further cardiovascular testing."  Patient will need day of surgery labs and evaluation.  EKG 12/06/21: NSR. Rate 91. LAD. Possible lateral infarct, age undetermined.   TEE (inta-op CABG) 06/25/18:  Septum: No Patent Foramen Ovale present.   Left atrium: Patent foramen ovale not present.   Aortic valve: The valve is trileaflet. Mild valve calcification present. No stenosis. Trace regurgitation. No AV vegetation.   Mitral valve: No leaflet thickening and calcification present. Trace regurgitation.   Right ventricle: Normal cavity size, wall thickness and ejection fraction. No thrombus present. No mass present.  Left ventricle: Normal cavity size, wall thickness. LV systolic function is low normal with an EF 50-55%. Wall motion is abnormal. Inferolateral wall motion in hypokinetic. No thrombus present. No mass present.   Echo 06/22/18: Study Conclusions  - Left ventricle: The cavity size was normal. Wall thickness was    normal. Systolic function was normal. The estimated ejection    fraction was in the range of 50% to 55%. Mild hypokinesis of the    apical myocardium. No thrombus seen on contrast study. Left    ventricular diastolic function parameters were normal.  - Aortic valve: Mildly calcified annulus. Mildly thickened    leaflets. No significant stenosis or regurgitation.  - Mitral valve: Mildly calcified annulus. No significant  stenosis    or regurgitation.    Cardiac cath 06/22/18 (PRE-CABG: Ost 1st Diag to 1st Diag lesion is 70% stenosed. 1st Diag lesion is 80% stenosed. LM: Normal LAD: Prox ectatic areas with 40% stenoses          Mid to distal LAD is likely the culprit vessel. Mid 100% occlusion, with recanalization.Distal apical LAD is completely occluded and receives faint collaterals from septal perforators, and distal RPDA. TIMI 1 flow in distal LAD, TIMI 0 flow in distal apical LAD          Medium sized Diag branch with mid 70% and distal 80% stenoses.  LCx: Large OM 1 with proxiaml 50% and mid to distal 80% stenosis         Medium sized OM2 completely occluded with collaterals from OM1.  RCA: Dominant. Prox 40%, mid 80%, and ostial RPDA 95% stenoses. RPDA gives faint collateral to apical distal  LVEF 50-55% with apical hypokinesis.  Recommendation: CT surgery consult (S/p CABG 06/25/18)   Carotid US 06/23/18: Summary:  - Right Carotid: Velocities in the right ICA are consistent with a 1-39%  stenosis.  - Left Carotid: Velocities in the left ICA are consistent with a 1-39%  stenosis.  - Vertebrals: Bilateral vertebral arteries demonstrate antegrade flow.     Joshua Branch [facility] Short Stay Center/Anesthesiology Phone [phone] 05/24/2022 4:23 PM

## 2022-05-24 NOTE — Pre-Procedure Instructions (Signed)
PCP - Mackie Pai, PA-C Cardiologist - Dr. Quay Burow  PPM/ICD - denies   Chest x-ray - 11/21/21 EKG - 12/06/21 Stress Test - denies ECHO - 06/25/18 Cardiac Cath - 06/25/18  CPAP - denies  Fasting Blood Sugar - 80-90 Checks Blood Sugar once every few days  Blood Thinner Instructions: n/a Aspirin Instructions: hold now  ERAS Protcol - clears until 0515  COVID TEST- n/a  Anesthesia review: yes, cardiac hx  Patient verbally denies any shortness of breath, fever, cough and chest pain during phone call   -------------  SDW INSTRUCTIONS given:  Your procedure is scheduled on 10/27.  Report to [facility] Main Entrance "A" at 05:45 A.M., and check in at the Admitting office.  Call this number if you have problems the morning of surgery:  [phone]   Remember:  Do not eat after midnight the night before your surgery  You may drink clear liquids until 05:15 AM the morning of your surgery.   Clear liquids allowed are: Water, Non-Citrus Juices (without pulp), Carbonated Beverages, Clear Tea, Black Coffee Only, and Gatorade    Take these medicines the morning of surgery with A SIP OF WATER  Xanax PRN, Atorvastatin, Metoprolol, Nitro PRN  As of today, STOP taking any Aspirin (unless otherwise instructed by your surgeon) Aleve, Naproxen, Ibuprofen, Motrin, Advil, Goody's, BC's, all herbal medications, fish oil, and all vitamins.                      Do not wear jewelry, make up, or nail polish            Do not wear lotions, powders, perfumes/colognes, or deodorant.            Do not shave 48 hours prior to surgery.  Men may shave face and neck.            Do not bring valuables to the hospital.            [facility] is not responsible for any belongings or valuables.  Do NOT Smoke (Tobacco/Vaping) 24 hours prior to your procedure If you use a CPAP at night, you may bring all equipment for your overnight stay.   Contacts, glasses, dentures or bridgework may not be  worn into surgery.      For patients admitted to the hospital, discharge time will be determined by your treatment team.   Patients discharged the day of surgery will not be allowed to drive home, and someone needs to stay with them for 24 hours.    Special instructions:   [place]- Preparing For Surgery  Before surgery, you can play an important role. Because skin is not sterile, your skin needs to be as free of germs as possible. You can reduce the number of germs on your skin by washing with CHG (chlorahexidine gluconate) Soap before surgery.  CHG is an antiseptic cleaner which kills germs and bonds with the skin to continue killing germs even after washing.    Oral Hygiene is also important to reduce your risk of infection.  Remember - BRUSH YOUR TEETH THE MORNING OF SURGERY WITH YOUR REGULAR TOOTHPASTE  Please do not use if you have an allergy to CHG or antibacterial soaps. If your skin becomes reddened/irritated stop using the CHG.  Do not shave (including legs and underarms) for at least 48 hours prior to first CHG shower. It is OK to shave your face.  Please follow these instructions carefully.  Shower the NIGHT BEFORE SURGERY and the MORNING OF SURGERY with DIAL Soap.   Pat yourself dry with a CLEAN TOWEL.  Wear CLEAN PAJAMAS to bed the night before surgery  Place CLEAN SHEETS on your bed the night of your first shower and DO NOT SLEEP WITH PETS.   Day of Surgery: Please shower morning of surgery  Wear Clean/Comfortable clothing the morning of surgery Do not apply any deodorants/lotions.   Remember to brush your teeth WITH YOUR REGULAR TOOTHPASTE.   Questions were answered. Patient verbalized understanding of instructions.

## 2022-05-24 NOTE — Anesthesia Preprocedure Evaluation (Addendum)
Anesthesia Evaluation  Patient identified by MRN, date of birth, ID band Patient awake    Reviewed: Allergy & Precautions, NPO status , Patient's Chart, lab work & pertinent test results, reviewed documented beta blocker date and time   History of Anesthesia Complications Negative for: history of anesthetic complications  Airway Mallampati: II  TM Distance: >3 FB Neck ROM: Full    Dental  (+) Missing,    Pulmonary neg pulmonary ROS,    Pulmonary exam normal        Cardiovascular hypertension, Pt. on medications and Pt. on home beta blockers + CAD, + Past MI (2019) and + CABG (2019)  Normal cardiovascular exam  TTE 2019: EF 50-55%, mild hypokinesis ofapical myocardium, valves ok   Neuro/Psych negative neurological ROS  negative psych ROS   GI/Hepatic Neg liver ROS, GERD  Controlled,  Endo/Other  diabetes, Type 2, Oral Hypoglycemic Agents  Renal/GU negative Renal ROS  negative genitourinary   Musculoskeletal  (+) Arthritis ,   Abdominal   Peds  Hematology  (+) Blood dyscrasia, anemia ,   Anesthesia Other Findings Left Foot Infection  Reproductive/Obstetrics negative OB ROS                           Anesthesia Physical Anesthesia Plan  ASA: 3  Anesthesia Plan: Regional and MAC   Post-op Pain Management: Tylenol PO (pre-op)*   Induction:   PONV Risk Score and Plan: 1 and Ondansetron, Treatment may vary due to age or medical condition, Midazolam and Propofol infusion  Airway Management Planned: Natural Airway and Simple Face Mask  Additional Equipment: None  Intra-op Plan:   Post-operative Plan:   Informed Consent: I have reviewed the patients History and Physical, chart, labs and discussed the procedure including the risks, benefits and alternatives for the proposed anesthesia with the patient or authorized representative who has indicated his/her understanding and acceptance.        Plan Discussed with: CRNA  Anesthesia Plan Comments: (PAT note by Karoline Caldwell, PA-C:  Follows with cardiology for hx of CAD s/p CABG x4 2019 (LIMA-LAD, SVG-[name], SVG-OM2, SVG-DIAG), HTN, OSA.  Patient has had multiple recent surgeries related to nonhealing foot ulcer.  He was cleared by cardiology prior to undergoing procedure in June 2023.  Per note by Ambrose Pancoast, NP on 12/29/2021, "Chart reviewed as part of pre-operative protocol coverage. Given past medical history and time since last visit, based on ACC/AHA guidelines,Donnel B Mayberrywould be at acceptable risk for the planned procedure without further cardiovascular testing."  Patient will need day of surgery labs and evaluation.  EKG 12/06/21: NSR. Rate 91. LAD. Possible lateral infarct, age undetermined.   TEE (inta-op CABG) 06/25/18:  Septum: No Patent Foramen Ovale present.   Left atrium: Patent foramen ovale not present.   Aortic valve: The valve is trileaflet. Mild valve calcification present. No stenosis. Trace regurgitation. No AV vegetation.   Mitral valve: No leaflet thickening and calcification present. Trace regurgitation.   Right ventricle: Normal cavity size, wall thickness and ejection fraction. No thrombus present. No mass present. Leftventricle: Normal cavity size, wall thickness. LV systolic function is low normal with an EF 50-55%. Wall motion is abnormal. Inferolateral wall motion in hypokinetic.No thrombus present. No mass present.  Echo 06/22/18: Study Conclusions  - Left ventricle: The cavity size was normal. Wall thickness was  normal. Systolic function was normal. The estimated ejection  fraction was in the range of 50% to 55%. Mild hypokinesis  of the  apical myocardium. No thrombus seen on contrast study. Left  ventricular diastolic function parameters were normal.  - Aortic valve: Mildly calcified annulus. Mildly thickened  leaflets. No significant stenosis or regurgitation.  -  Mitral valve: Mildly calcified annulus. No significant stenosis  or regurgitation.   Cardiac cath 06/22/18 (PRE-CABG: . Ost 1st Diag to 1st Diag lesion is 70% stenosed. . 1st Diag lesion is 80% stenosed. LM: Normal LAD: Prox ectatic areas with 40% stenoses Mid to distal LAD is likely the culprit vessel. Mid 100% occlusion, with recanalization.Distal apical LAD is completely occluded and receives faint collaterals from septal perforators, and distal RPDA. TIMI 1 flow in distal LAD, TIMI 0 flow in distal apical LAD Medium sized Diag branch with mid 70% and distal 80% stenoses.  LCx: Large OM 1 with proxiaml 50% and mid to distal 80% stenosis Medium sized OM2 completely occluded with collaterals from OM1.  RCA: Dominant. Prox 40%, mid 80%, and ostial RPDA 95% stenoses. RPDA gives faint collateral to apical distal  LVEF 50-55% with apical hypokinesis. Recommendation: CT surgery consult (S/p CABG 06/25/18)  Carotid US 06/23/18: Summary: -Right Carotid: Velocities in the right ICA are consistent with a 1-39%  stenosis.  -Left Carotid: Velocities in the left ICA are consistent with a 1-39%  stenosis.  -Vertebrals: Bilateral vertebral arteries demonstrate antegrade flow.  )     Anesthesia Quick Evaluation

## 2022-05-25 ENCOUNTER — Other Ambulatory Visit: Payer: Self-pay

## 2022-05-25 ENCOUNTER — Ambulatory Visit (HOSPITAL_COMMUNITY)
Admission: RE | Admit: 2022-05-25 | Discharge: 2022-05-25 | Disposition: A | Discharge status: Home / Self Care | Payer: 59 | Attending: Orthopedic Surgery | Admitting: Orthopedic Surgery

## 2022-05-25 ENCOUNTER — Ambulatory Visit (HOSPITAL_BASED_OUTPATIENT_CLINIC_OR_DEPARTMENT_OTHER): Payer: 59 | Admitting: Physician Assistant

## 2022-05-25 ENCOUNTER — Encounter (HOSPITAL_COMMUNITY)
Admission: RE | Disposition: A | Discharge status: Home / Self Care | Payer: Self-pay | Source: Home / Self Care | Attending: Orthopedic Surgery

## 2022-05-25 ENCOUNTER — Ambulatory Visit (HOSPITAL_COMMUNITY): Payer: 59 | Admitting: Physician Assistant

## 2022-05-25 ENCOUNTER — Other Ambulatory Visit (HOSPITAL_BASED_OUTPATIENT_CLINIC_OR_DEPARTMENT_OTHER): Payer: Self-pay

## 2022-05-25 DIAGNOSIS — I10 Essential (primary) hypertension: Secondary | ICD-10-CM

## 2022-05-25 DIAGNOSIS — D759 Disease of blood and blood-forming organs, unspecified: Secondary | ICD-10-CM | POA: Diagnosis not present

## 2022-05-25 DIAGNOSIS — I252 Old myocardial infarction: Secondary | ICD-10-CM | POA: Diagnosis not present

## 2022-05-25 DIAGNOSIS — T8781 Dehiscence of amputation stump: Secondary | ICD-10-CM | POA: Diagnosis present

## 2022-05-25 DIAGNOSIS — I2510 Atherosclerotic heart disease of native coronary artery without angina pectoris: Secondary | ICD-10-CM | POA: Insufficient documentation

## 2022-05-25 DIAGNOSIS — L97521 Non-pressure chronic ulcer of other part of left foot limited to breakdown of skin: Secondary | ICD-10-CM | POA: Insufficient documentation

## 2022-05-25 DIAGNOSIS — Z951 Presence of aortocoronary bypass graft: Secondary | ICD-10-CM | POA: Insufficient documentation

## 2022-05-25 DIAGNOSIS — M86272 Subacute osteomyelitis, left ankle and foot: Secondary | ICD-10-CM

## 2022-05-25 DIAGNOSIS — E11621 Type 2 diabetes mellitus with foot ulcer: Secondary | ICD-10-CM | POA: Insufficient documentation

## 2022-05-25 DIAGNOSIS — D649 Anemia, unspecified: Secondary | ICD-10-CM | POA: Insufficient documentation

## 2022-05-25 DIAGNOSIS — K219 Gastro-esophageal reflux disease without esophagitis: Secondary | ICD-10-CM | POA: Insufficient documentation

## 2022-05-25 DIAGNOSIS — Y835 Amputation of limb(s) as the cause of abnormal reaction of the patient, or of later complication, without mention of misadventure at the time of the procedure: Secondary | ICD-10-CM | POA: Diagnosis not present

## 2022-05-25 DIAGNOSIS — Z7984 Long term (current) use of oral hypoglycemic drugs: Secondary | ICD-10-CM | POA: Insufficient documentation

## 2022-05-25 DIAGNOSIS — Z89432 Acquired absence of left foot: Secondary | ICD-10-CM

## 2022-05-25 HISTORY — PX: AMPUTATION: SHX166

## 2022-05-25 HISTORY — PX: APPLICATION OF WOUND VAC: SHX5189

## 2022-05-25 HISTORY — DX: Subacute osteomyelitis, left ankle and foot: M86.272

## 2022-05-25 HISTORY — DX: Anemia, unspecified: D64.9

## 2022-05-25 LAB — CBC
HCT: 33.4 % — ABNORMAL LOW (ref 39.0–52.0)
Hemoglobin: 10.7 g/dL — ABNORMAL LOW (ref 13.0–17.0)
MCH: 27.3 pg (ref 26.0–34.0)
MCHC: 32 g/dL (ref 30.0–36.0)
MCV: 85.2 fL (ref 80.0–100.0)
Platelets: 371 10*3/uL (ref 150–400)
RBC: 3.92 MIL/uL — ABNORMAL LOW (ref 4.22–5.81)
RDW: 15.1 % (ref 11.5–15.5)
WBC: 6.3 10*3/uL (ref 4.0–10.5)
nRBC: 0 % (ref 0.0–0.2)

## 2022-05-25 LAB — BASIC METABOLIC PANEL
Anion gap: 11 (ref 5–15)
BUN: 25 mg/dL — ABNORMAL HIGH (ref 6–20)
CO2: 27 mmol/L (ref 22–32)
Calcium: 8.6 mg/dL — ABNORMAL LOW (ref 8.9–10.3)
Chloride: 103 mmol/L (ref 98–111)
Creatinine, Ser: 1.03 mg/dL (ref 0.61–1.24)
GFR, Estimated: >60 mL/min (ref >60)
Glucose, Bld: 109 mg/dL — ABNORMAL HIGH (ref 70–99)
Potassium: 3.5 mmol/L (ref 3.5–5.1)
Sodium: 141 mmol/L (ref 135–145)

## 2022-05-25 LAB — GLUCOSE, CAPILLARY
Glucose-Capillary: 100 mg/dL — ABNORMAL HIGH (ref 70–99)
Glucose-Capillary: 105 mg/dL — ABNORMAL HIGH (ref 70–99)

## 2022-05-25 SURGERY — AMPUTATION, FOOT, RAY
Anesthesia: Monitor Anesthesia Care | Site: Foot | Laterality: Left

## 2022-05-25 MED ORDER — BUPIVACAINE HCL (PF) 0.5 % IJ SOLN
INTRAMUSCULAR | Status: DC | PRN
  Administered 2022-05-25: 30 mL via PERINEURAL

## 2022-05-25 MED ORDER — FENTANYL CITRATE (PF) 250 MCG/5ML IJ SOLN
INTRAMUSCULAR | Status: DC | PRN
  Administered 2022-05-25: 50 ug via INTRAVENOUS

## 2022-05-25 MED ORDER — FENTANYL CITRATE (PF) 100 MCG/2ML IJ SOLN: 25.0000 ug | INTRAMUSCULAR | Status: DC | PRN

## 2022-05-25 MED ORDER — ORAL CARE MOUTH RINSE: 15.0000 mL | Freq: Once | OROMUCOSAL | Status: AC

## 2022-05-25 MED ORDER — LACTATED RINGERS IV SOLN: INTRAVENOUS | Status: DC

## 2022-05-25 MED ORDER — 0.9 % SODIUM CHLORIDE (POUR BTL) OPTIME
TOPICAL | Status: DC | PRN
  Administered 2022-05-25: 1000 mL

## 2022-05-25 MED ORDER — CEFAZOLIN SODIUM-DEXTROSE 2-4 GM/100ML-% IV SOLN
2.0000 g | INTRAVENOUS | Status: AC
  Administered 2022-05-25: 2 g via INTRAVENOUS
  Filled 2022-05-25: qty 100

## 2022-05-25 MED ORDER — MIDAZOLAM HCL 2 MG/2ML IJ SOLN
2.0000 mg | Freq: Once | INTRAMUSCULAR | Status: AC
  Filled 2022-05-25: qty 2

## 2022-05-25 MED ORDER — EPHEDRINE 5 MG/ML INJ
INTRAVENOUS | Status: AC
  Filled 2022-05-25: qty 5

## 2022-05-25 MED ORDER — FENTANYL CITRATE PF 50 MCG/ML IJ SOSY: 50.0000 ug | PREFILLED_SYRINGE | Freq: Once | INTRAMUSCULAR | Status: DC

## 2022-05-25 MED ORDER — ROCURONIUM BROMIDE 10 MG/ML (PF) SYRINGE
PREFILLED_SYRINGE | INTRAVENOUS | Status: AC
  Filled 2022-05-25: qty 10

## 2022-05-25 MED ORDER — FENTANYL CITRATE (PF) 250 MCG/5ML IJ SOLN
INTRAMUSCULAR | Status: AC
  Filled 2022-05-25: qty 5

## 2022-05-25 MED ORDER — FENTANYL CITRATE (PF) 100 MCG/2ML IJ SOLN
100.0000 ug | Freq: Once | INTRAMUSCULAR | Status: AC
  Filled 2022-05-25: qty 2

## 2022-05-25 MED ORDER — CHLORHEXIDINE GLUCONATE 0.12 % MT SOLN
15.0000 mL | Freq: Once | OROMUCOSAL | Status: AC
  Administered 2022-05-25: 15 mL via OROMUCOSAL
  Filled 2022-05-25: qty 15

## 2022-05-25 MED ORDER — OXYCODONE HCL 5 MG PO TABS: 5.0000 mg | ORAL_TABLET | Freq: Once | ORAL | Status: DC | PRN

## 2022-05-25 MED ORDER — MIDAZOLAM HCL 2 MG/2ML IJ SOLN
INTRAMUSCULAR | Status: AC
  Administered 2022-05-25: 2 mg via INTRAVENOUS
  Filled 2022-05-25: qty 2

## 2022-05-25 MED ORDER — AMISULPRIDE (ANTIEMETIC) 5 MG/2ML IV SOLN: 10.0000 mg | Freq: Once | INTRAVENOUS | Status: DC | PRN

## 2022-05-25 MED ORDER — MIDAZOLAM HCL 2 MG/2ML IJ SOLN
INTRAMUSCULAR | Status: AC
  Filled 2022-05-25: qty 2

## 2022-05-25 MED ORDER — ACETAMINOPHEN 500 MG PO TABS
1000.0000 mg | ORAL_TABLET | Freq: Once | ORAL | Status: AC
  Administered 2022-05-25: 1000 mg via ORAL
  Filled 2022-05-25: qty 2

## 2022-05-25 MED ORDER — PROPOFOL 500 MG/50ML IV EMUL
INTRAVENOUS | Status: DC | PRN
  Administered 2022-05-25: 25 ug/kg/min via INTRAVENOUS

## 2022-05-25 MED ORDER — HYDROCODONE-ACETAMINOPHEN 5-325 MG PO TABS: 1.0000 | ORAL_TABLET | Freq: Four times a day (QID) | ORAL | Status: DC | PRN

## 2022-05-25 MED ORDER — MIDAZOLAM HCL 2 MG/2ML IJ SOLN
INTRAMUSCULAR | Status: DC | PRN
  Administered 2022-05-25: 2 mg via INTRAVENOUS

## 2022-05-25 MED ORDER — PROPOFOL 10 MG/ML IV BOLUS
INTRAVENOUS | Status: AC
  Filled 2022-05-25: qty 20

## 2022-05-25 MED ORDER — OXYCODONE HCL 5 MG/5ML PO SOLN: 5.0000 mg | Freq: Once | ORAL | Status: DC | PRN

## 2022-05-25 MED ORDER — FENTANYL CITRATE (PF) 100 MCG/2ML IJ SOLN
INTRAMUSCULAR | Status: AC
  Administered 2022-05-25: 50 ug via INTRAVENOUS
  Filled 2022-05-25: qty 2

## 2022-05-25 MED ORDER — EPHEDRINE SULFATE-NACL 50-0.9 MG/10ML-% IV SOSY
PREFILLED_SYRINGE | INTRAVENOUS | Status: DC | PRN
  Administered 2022-05-25 (×2): 10 mg via INTRAVENOUS

## 2022-05-25 MED ORDER — LIDOCAINE 2% (20 MG/ML) 5 ML SYRINGE
INTRAMUSCULAR | Status: AC
  Filled 2022-05-25: qty 5

## 2022-05-25 MED ORDER — INSULIN ASPART 100 UNIT/ML IJ SOLN: 0.0000 [IU] | INTRAMUSCULAR | Status: DC | PRN

## 2022-05-25 MED ORDER — HYDROCODONE-ACETAMINOPHEN 5-325 MG PO TABS
1.0000 | ORAL_TABLET | ORAL | 0 refills | Status: DC | PRN
  Filled 2022-05-25: qty 30, 5d supply, fill #0

## 2022-05-25 SURGICAL SUPPLY — 33 items
BAG COUNTER SPONGE SURGICOUNT (BAG) ×2 IMPLANT
BLADE SAW SGTL MED 73X18.5 STR (BLADE) IMPLANT
BLADE SURG 21 STRL SS (BLADE) ×2 IMPLANT
BNDG COHESIVE 4X5 TAN STRL (GAUZE/BANDAGES/DRESSINGS) ×2 IMPLANT
BNDG GAUZE DERMACEA FLUFF 4 (GAUZE/BANDAGES/DRESSINGS) ×2 IMPLANT
COVER SURGICAL LIGHT HANDLE (MISCELLANEOUS) ×4 IMPLANT
DRAPE DERMATAC (DRAPES) IMPLANT
DRAPE U-SHAPE 47X51 STRL (DRAPES) ×4 IMPLANT
DRESSING PEEL AND PLC PRVNA 13 (GAUZE/BANDAGES/DRESSINGS) IMPLANT
DRSG ADAPTIC 3X8 NADH LF (GAUZE/BANDAGES/DRESSINGS) ×2 IMPLANT
DRSG PEEL AND PLACE PREVENA 13 (GAUZE/BANDAGES/DRESSINGS) ×2
DURAPREP 26ML APPLICATOR (WOUND CARE) ×2 IMPLANT
ELECT REM PT RETURN 9FT ADLT (ELECTROSURGICAL) ×2
ELECTRODE REM PT RTRN 9FT ADLT (ELECTROSURGICAL) ×2 IMPLANT
GAUZE PAD ABD 8X10 STRL (GAUZE/BANDAGES/DRESSINGS) ×4 IMPLANT
GAUZE SPONGE 4X4 12PLY STRL (GAUZE/BANDAGES/DRESSINGS) ×2 IMPLANT
GLOVE BIOGEL PI IND STRL 7.5 (GLOVE) IMPLANT
GLOVE BIOGEL PI IND STRL 9 (GLOVE) ×2 IMPLANT
GLOVE SS BIOGEL STRL SZ 7.5 (GLOVE) IMPLANT
GLOVE SURG ORTHO 9.0 STRL STRW (GLOVE) ×2 IMPLANT
GOWN STRL REUS W/ TWL XL LVL3 (GOWN DISPOSABLE) ×4 IMPLANT
GOWN STRL REUS W/TWL XL LVL3 (GOWN DISPOSABLE) ×4
GRAFT SKIN WND MICRO 38 (Tissue) IMPLANT
KIT BASIN OR (CUSTOM PROCEDURE TRAY) ×2 IMPLANT
KIT DRSG PREVENA PLUS 7DAY 125 (MISCELLANEOUS) IMPLANT
KIT TURNOVER KIT B (KITS) ×2 IMPLANT
NS IRRIG 1000ML POUR BTL (IV SOLUTION) ×2 IMPLANT
PACK ORTHO EXTREMITY (CUSTOM PROCEDURE TRAY) ×2 IMPLANT
PAD ARMBOARD 7.5X6 YLW CONV (MISCELLANEOUS) ×4 IMPLANT
SUT ETHILON 2 0 PSLX (SUTURE) ×2 IMPLANT
TOWEL GREEN STERILE (TOWEL DISPOSABLE) ×2 IMPLANT
TUBE CONNECTING 12X1/4 (SUCTIONS) ×2 IMPLANT
YANKAUER SUCT BULB TIP NO VENT (SUCTIONS) ×2 IMPLANT

## 2022-05-25 NOTE — Op Note (Signed)
05/25/2022  10:37 AM  PATIENT:  Joshua Branch    PRE-OPERATIVE DIAGNOSIS:  Left Foot Infection with dehiscence transmetatarsal amputation  POST-OPERATIVE DIAGNOSIS:  Same  PROCEDURE:  LEFT FOOT CHOPART amputation,  APPLICATION OF WOUND VAC Application Kerecis micro powder 38 cm.  SURGEON:  Newt Minion, MD  PHYSICIAN ASSISTANT:None ANESTHESIA:   General  PREOPERATIVE INDICATIONS:  Joshua Branch is a  61 y.o. male with a diagnosis of Left Foot Infection who failed conservative measures and elected for surgical management.    The risks benefits and alternatives were discussed with the patient preoperatively including but not limited to the risks of infection, bleeding, nerve injury, cardiopulmonary complications, the need for revision surgery, among others, and the patient was willing to proceed.  OPERATIVE IMPLANTS: Kerecis micro powder 38 cm.  '@ENCIMAGES'$ @  OPERATIVE FINDINGS: Good petechial bleeding tissue margins were clear.  OPERATIVE PROCEDURE: Patient was brought the operating room and underwent general anesthetic.  After adequate levels anesthesia were obtained patient's left lower extremity was prepped using DuraPrep draped into a sterile field a timeout was called.  A fishmouth incision was made just proximal to the ulcerative tissue.  A oscillating saw was used to perform a show part amputation.  Tissue margins were clear electrocautery was used for hemostasis.  The wound was filled with 38 cm Kerecis micro powder the wound was closed using 2-0 nylon foot was covered with a 13 cm Prevena wound VAC pump this had a good suction fit patient was extubated taken the PACU in stable condition   DISCHARGE PLANNING:  Antibiotic duration: Preoperative antibiotics  Weightbearing: Nonweightbearing on the left  Pain medication: Prescription for Vicodin  Dressing care/ Wound VAC: Continue wound VAC for 1 week  Ambulatory devices: Walker crutches or kneeling  scooter  Discharge to: Home.  Follow-up: In the office 1 week post operative.

## 2022-05-25 NOTE — H&P (Signed)
Joshua Branch is an 61 y.o. male.   Chief Complaint: Ulceration left transmetatarsal amputation. HPI: Patient is a 61 year old gentleman status post transmetatarsal amputation in May and status post Kerecis tissue graft in August.  Patient still has a nonhealing wound involving the entire forefoot.  Patient's insurance denied in the office application of the Kerecis tissue graft.   Past Medical History:  Diagnosis Date   Acute myocardial infarction ([place]) 06/22/2018   Allergy    Anemia    iron deficiency   Charcot's joint of left foot, non-diabetic    Coronary artery disease involving native coronary artery with unstable angina pectoris ([place]) 06/22/2018   Diabetes ([place])    type 2   GERD (gastroesophageal reflux disease)    History of kidney stones    passed   Hyperlipidemia    Hypertension    Neuropathy of left foot    Nodular basal cell carcinoma (BCC) 07/10/2019   Right V of Neck (curet and 5FU)   Pneumonia    As a child   Right foot ulcer ([place])    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   LIMA to LAD, SVG to Diag, SVG to OM2, SVG to [name], EVH via right thigh and leg   Type II diabetes mellitus with complication, uncontrolled    Uncontrolled type 2 diabetes mellitus     Past Surgical History:  Procedure Laterality Date   AMPUTATION Right 01/06/2020   Procedure: RIGHT FOOT 2ND RAY AMPUTATION;  Surgeon: Newt Minion, MD;  Location: [place];  Service: Orthopedics;  Laterality: Right;   AMPUTATION Left 01/11/2021   Procedure: LEFT FOOT 3RD RAY AMPUTATION;  Surgeon: Newt Minion, MD;  Location: [facility];  Service: Orthopedics;  Laterality: Left;   AMPUTATION Left 12/22/2021   Procedure: LEFT TRANSMETATARSAL AMPUTATION;  Surgeon: Newt Minion, MD;  Location: [place];  Service: Orthopedics;  Laterality: Left;   APPLICATION OF WOUND VAC  12/22/2021   Procedure: APPLICATION OF WOUND VAC;  Surgeon: Newt Minion, MD;  Location: [place];  Service: Orthopedics;;   COLONOSCOPY W/ POLYPECTOMY     CORONARY  ARTERY BYPASS GRAFT N/A 06/25/2018   Procedure: CORONARY ARTERY BYPASS GRAFTING (CABG) TIMES FOUR USING LEFT INTERNAL MAMMARY ARTERY AND RIGHT ENDOSCOPICALLY HARVESTED SAPHENOUS VEIN;  Surgeon: Rexene Alberts, MD;  Location: [place];  Service: Open Heart Surgery;  Laterality: N/A;   CYSTOSCOPY WITH STENT PLACEMENT Left 12/04/2021   Procedure: CYSTOSCOPY/RETROGRADE/ STENT PLACEMENT;  Surgeon: Vira Agar, MD;  [facility];  Service: Urology;  Laterality: Left;   CYSTOSCOPY/URETEROSCOPY/HOLMIUM LASER/STENT PLACEMENT Left 01/05/2022   Procedure: CYSTOSCOPY LEFT URETERAL STENT REMOVAL  LEFT RETROGRADE PYELOGRAM URETEROSCOPY BASKET STONE EXTRACTION STENT PLACEMENT;  Surgeon: Lucas Mallow, MD;  [facility];  Service: Urology;  Laterality: Left;  1 HR FOR CASE   ENDOVEIN HARVEST OF GREATER SAPHENOUS VEIN Right 06/25/2018   Procedure: ENDOVEIN HARVEST OF GREATER SAPHENOUS VEIN;  Surgeon: Rexene Alberts, MD;  Location: [place];  Service: Open Heart Surgery;  Laterality: Right;   EXTRACORPOREAL SHOCK WAVE LITHOTRIPSY Left 12/07/2021   Procedure: EXTRACORPOREAL SHOCK WAVE LITHOTRIPSY (ESWL);  Surgeon: Festus Aloe, MD;  Location: [facility];  Service: Urology;  Laterality: Left;   LEFT HEART CATH AND CORONARY ANGIOGRAPHY N/A 06/22/2018   Procedure: LEFT HEART CATH AND CORONARY ANGIOGRAPHY;  Surgeon: Nigel Mormon, MD;  Location: [facility] [facility];  Service: Cardiovascular;  Laterality: N/A;   SKIN SPLIT GRAFT Left 02/28/2022  Procedure: LEFT FOOT SKIN GRAFT;  Surgeon: Newt Minion, MD;  Location: [place];  Service: Orthopedics;  Laterality: Left;   surgical debridement  Right    Right foot ulcer   TEE WITHOUT CARDIOVERSION N/A 06/25/2018   Procedure: TRANSESOPHAGEAL ECHOCARDIOGRAM (TEE);  Surgeon: Rexene Alberts, MD;  Location: [facility];  Service: Open Heart Surgery;  Laterality: N/A;    Family History  Problem Relation Age of Onset   Diabetes Mother    Stroke  Father    Hypertension Father    Hearing loss Father    Colon polyps Father    Colon cancer Neg Hx    Esophageal cancer Neg Hx    Prostate cancer Neg Hx    Social History:  reports that he has never smoked. He has never used smokeless tobacco. He reports current alcohol use. He reports that he does not use drugs.  Allergies:  Allergies  Allergen Reactions   Apricot Kernel Oil [Prunus] Swelling    THROAT   Cherry Swelling    THROAT   Jardiance [Empagliflozin] Diarrhea and Nausea And Vomiting   Other Other (See Comments)    Fruits with a pit. Throat swells up. Can have them if cooked    Peach [Prunus Persica] Swelling    THROAT   Plum Pulp Swelling    THROAT    Medications Prior to Admission  Medication Sig Dispense Refill   ALPRAZolam (XANAX) 0.5 MG tablet Take 1 tablet (0.5 mg total) by mouth 3 (three) times daily as needed for anxiety. 30 tablet 1   aspirin EC 81 MG tablet Take 1 tablet (81 mg total) by mouth daily. Swallow whole. 30 tablet 2   atorvastatin (LIPITOR) 80 MG tablet Take 1 tablet (80 mg total) by mouth daily. (Patient taking differently: Take 80 mg by mouth every evening.) 90 tablet 2   Coenzyme Q10 (CO Q-10) 100 MG CAPS Take 3 capsules (300 mg) by mouth in the morning. 270 capsule 0   collagenase (SANTYL) 250 UNIT/GM ointment Apply to the affected area daily plus dry dressing as directed 90 g 3   glipiZIDE (GLUCOTROL XL) 5 MG 24 hr tablet Take 1 tablet (5 mg total) by mouth daily before breakfast. 90 tablet 3   hydrochlorothiazide (MICROZIDE) 12.5 MG capsule TAKE 1 CAPSULE (12.5 MG TOTAL) BY MOUTH DAILY. 90 capsule 1   Iron, Ferrous Sulfate, 325 (65 Fe) MG TABS Take 1 tablet by mouth everyday 100 tablet 3   lisinopril (ZESTRIL) 5 MG tablet Take 1 tablet (5 mg total) by mouth daily. 30 tablet 3   metFORMIN (GLUCOPHAGE) 500 MG tablet Take 3 tablets by mouth daily as advised (Patient taking differently: Take 500 mg by mouth in the morning and at bedtime.) 270 tablet  3   metoprolol tartrate (LOPRESSOR) 25 MG tablet TAKE 1 TABLET (25 MG TOTAL) BY MOUTH 2 (TWO) TIMES DAILY. 180 tablet 3   Multiple Vitamin (MULTIVITAMIN WITH MINERALS) TABS tablet Take 1 tablet by mouth every evening.     pentoxifylline (TRENTAL) 400 MG CR tablet Take 1 tablet (400 mg total) by mouth 3 (three) times daily with meals. 90 tablet 3   sildenafil (VIAGRA) 50 MG tablet Take 1 tablet (50 mg total) by mouth daily as needed for erectile dysfunction. 10 tablet 0   silver sulfADIAZINE (SILVADENE) 1 % cream Apply 1 Application topically daily. Apply to affected area daily plus dry dressing 400 g 3   Testosterone 30 MG/ACT SOLN Apply 2 pumps topically as  directed daily 90 mL 1   glucose blood (FREESTYLE LITE) test strip USE AS INSTRUCTED TO CHECK 1-2X A DAY 100 strip 1   Lancets MISC Check sugars 3 times a day E11.9 100 each 0   nitroGLYCERIN (NITRODUR - DOSED IN MG/24 HR) 0.2 mg/hr patch Place 1 patch (0.2 mg total)  onto the skin to left foot daily as directed. (Patient not taking: Reported on 05/24/2022) 30 patch 2   Zoster Vaccine Adjuvanted [facility]) injection Inject into the muscle. 0.5 mL 0    Results for orders placed or performed during the hospital encounter of 05/25/22 (from the past 48 hour(s))  Glucose, capillary     Status: Abnormal   Collection Time: 05/25/22  6:20 AM  Result Value Ref Range   Glucose-Capillary 100 (H) 70 - 99 mg/dL    Comment: Glucose reference range applies only to samples taken after fasting for at least 8 hours.   No results found.  Review of Systems  All other systems reviewed and are negative.   Blood pressure 125/77, pulse 72, temperature 97.8 F (36.6 C), temperature source Oral, resp. rate 17, height 6' 2.5" (1.892 m), weight 102.1 kg, SpO2 95 %. Physical Exam  Patient is alert, oriented, no adenopathy, well-dressed, normal affect, normal respiratory effort. Examination patient has good hair growth to the ankle.  He has a palpable dorsalis  pedis pulse he has a large nonhealing ulcer of the left foot there is no exposed bone no extending cellulitis.  Doppler shows a strong biphasic dorsalis pedis pulse.  Ulcer is 11 x 9 cm.  With the size of the ulcer and lack of progression of wound care I have recommended proceeding with further foot salvage surgery with a Chopart amputation versus transtibial amputation.  Patient would like to proceed with further foot salvage intervention. Assessment/Plan 1. History of transmetatarsal amputation of left foot ([place])   2. History of artificial skin graft   3. Non-pressure chronic ulcer of other part of left foot limited to breakdown of skin ([place])       Plan: With the large nonhealing ulcer and no options available for office management we will plan to proceed with a show part amputation.  A prescription was provided for Xanax for his nerves.  Plan for outpatient surgery tomorrow.  Risks and benefits of surgery were discussed.  Newt Minion, MD 05/25/2022, 6:40 AM

## 2022-05-25 NOTE — Transfer of Care (Signed)
Immediate Anesthesia Transfer of Care Note  [name]  Procedure(s) Performed: LEFT FOOT CHOPART (Left) APPLICATION OF WOUND VAC (Left: Foot)  Patient Location: PACU  Anesthesia Type:MAC combined with regional for post-op pain  Level of Consciousness: drowsy and patient cooperative  Airway & Oxygen Therapy: Patient Spontanous Breathing  Post-op Assessment: Report given to RN and Post -op Vital signs reviewed and stable  Post vital signs: Reviewed and stable  Last Vitals:  Vitals Value Taken Time  BP 103/72 05/25/22 0855  Temp    Pulse 76 05/25/22 0856  Resp 16 05/25/22 0856  SpO2 94 % 05/25/22 0856  Vitals shown include unvalidated device data.  Last Pain:  Vitals:   05/25/22 0701  TempSrc:   PainSc: 0-No pain         Complications: No notable events documented.

## 2022-05-25 NOTE — Progress Notes (Signed)
Orthopedic Tech Progress Note Patient Details:  Joshua Branch 07/15/1961 [id number]  CAM [name] was applied to pt. Pt tolerated the application well.  Ortho Devices Type of Ortho Device: CAM walker Ortho Device/Splint Location: LLE Ortho Device/Splint Interventions: Ordered, Adjustment, Application   Post Interventions Patient Tolerated: Well Instructions Provided: Adjustment of device, Care of device, Poper ambulation with device  [name] 05/25/2022, 10:09 AM

## 2022-05-25 NOTE — Anesthesia Procedure Notes (Signed)
Anesthesia Regional Block: Ankle block   Pre-Anesthetic Checklist: , timeout performed,  Correct Patient, Correct Site, Correct Laterality,  Correct Procedure, Correct Position, site marked,  Risks and benefits discussed,  Pre-op evaluation,  At surgeon's request and post-op pain management  Laterality: Left  Prep: Maximum Sterile Barrier Precautions used, chloraprep       Needles:  Injection technique: Single-shot  Needle Type: Echogenic Needle     Needle Length: 4cm  Needle Gauge: 25     Additional Needles:   Narrative:  Start time: 05/25/2022 7:27 AM End time: 05/25/2022 7:30 AM  Performed by: Personally  Anesthesiologist: Brennan Bailey, MD  Additional Notes: Risks, benefits, and alternative discussed. Patient gave consent for procedure. Patient prepped and draped in sterile fashion. Sedation administered, patient remains easily responsive to voice. Local anesthetic given in 5cc increments with no signs or symptoms of intravascular injection. No pain or paraesthesias with injection. Patient monitored throughout procedure with signs of LAST or immediate complications. Tolerated well.   Tawny Asal, MD

## 2022-05-26 NOTE — Anesthesia Postprocedure Evaluation (Signed)
Anesthesia Post Note  [name]  Procedure(s) Performed: LEFT FOOT CHOPART (Left) APPLICATION OF WOUND VAC (Left: Foot)     Patient location during evaluation: PACU Anesthesia Type: Regional Level of consciousness: awake and alert Pain management: pain level controlled Vital Signs Assessment: post-procedure vital signs reviewed and stable Respiratory status: spontaneous breathing, nonlabored ventilation and respiratory function stable Cardiovascular status: blood pressure returned to baseline Postop Assessment: no apparent nausea or vomiting Anesthetic complications: no   No notable events documented.  Last Vitals:  Vitals:   05/25/22 0915 05/25/22 0925  BP: 112/76 114/76  Pulse: 68 68  Resp: 18 14  Temp:  36.6 C  SpO2: 96% 100%    Last Pain:  Vitals:   05/25/22 0925  TempSrc:   PainSc: 0-No pain   Pain Goal:                   Marthenia Rolling

## 2022-05-28 ENCOUNTER — Encounter (HOSPITAL_COMMUNITY): Payer: Self-pay | Admitting: Orthopedic Surgery

## 2022-05-28 ENCOUNTER — Telehealth: Payer: Self-pay

## 2022-05-28 NOTE — Telephone Encounter (Signed)
Surgery notes states that all margins were clear. Please advise.

## 2022-05-28 NOTE — Telephone Encounter (Signed)
Patient would like to know if he needs a Rx for Penicillin.  Patient had left foot surgery on 05/25/2022.  Cb# [phone].  Please advise.  Thank you.

## 2022-05-28 NOTE — Telephone Encounter (Signed)
Pt informed

## 2022-05-30 ENCOUNTER — Ambulatory Visit (INDEPENDENT_AMBULATORY_CARE_PROVIDER_SITE_OTHER): Payer: 59 | Admitting: Family

## 2022-05-30 DIAGNOSIS — Z89432 Acquired absence of left foot: Secondary | ICD-10-CM | POA: Diagnosis not present

## 2022-06-01 ENCOUNTER — Encounter: Payer: Self-pay | Admitting: Family

## 2022-06-01 ENCOUNTER — Ambulatory Visit (INDEPENDENT_AMBULATORY_CARE_PROVIDER_SITE_OTHER): Payer: 59 | Admitting: Family

## 2022-06-01 DIAGNOSIS — Z89439 Acquired absence of unspecified foot: Secondary | ICD-10-CM

## 2022-06-01 NOTE — Progress Notes (Signed)
Post-Op Visit Note   [name]           Date of Birth: 05-14-1961           MRN: [id number] Visit Date: 06/01/2022 PCP: Mackie Pai, PA-C  Chief Complaint:  Chief Complaint  Patient presents with   Left Foot - Routine Post Op    05/25/22 left foot chopart    HPI:  HPI The patient is a 61 year old gentleman who is seen status post Chopart amputation of the left foot he has been nonweightbearing in a cam walker with a kneeling scooter wound VAC removed today Ortho Exam Some hematoma present in his incision this is not gaped there is some mild surrounding maceration no erythema no warmth no purulence Visit Diagnoses: No diagnosis found.  Plan: Begin daily Dial soap cleansing dry dressings elevate for swelling discussed using a compression stocking as well he will follow-up in 2 weeks  Follow-Up Instructions: Return in about 2 weeks (around 06/15/2022).   Imaging: No results found.  Orders:  No orders of the defined types were placed in this encounter.  No orders of the defined types were placed in this encounter.    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   Acute coronary syndrome ([place]) 06/22/2018   Nonhealing skin ulcer ([place]) 06/22/2018   Coronary artery disease involving native coronary artery with unstable angina pectoris ([place]) 06/22/2018   Acute myocardial infarction ([place]) 06/22/2018   Hyperlipidemia    Active cochlear  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   Vertigo 08/20/2016   Myringotomy tube status 08/20/2016   Ear fullness, left 08/20/2016   Asymmetrical left sensorineural hearing loss 02/06/2016   Neuropathy of left foot 02/03/2015   Charcot's joint of left foot, non-diabetic 02/03/2015   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   Cramping of hands 06/02/2014   Eustachian tube dysfunction 06/02/2014   HTN (hypertension) 05/07/2014   Obstructive sleep apnea 05/07/2014   Past Medical History:  Diagnosis Date   Acute myocardial infarction ([place]) 06/22/2018   Allergy    Anemia    iron deficiency   Charcot's joint of left foot, non-diabetic    Coronary artery disease involving native coronary artery with unstable angina pectoris ([place]) 06/22/2018   Diabetes ([place])    type 2   GERD (gastroesophageal reflux disease)    History of kidney stones    passed   Hyperlipidemia    Hypertension    Neuropathy of left foot    Nodular basal cell carcinoma (BCC) 07/10/2019   Right V of Neck (curet and 5FU)   Pneumonia    As a child   Right foot ulcer ([place])    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   LIMA to LAD, SVG to Diag, SVG to OM2, SVG to [name], EVH via right thigh and leg   Type II diabetes mellitus with complication, uncontrolled    Uncontrolled type 2 diabetes mellitus  Family History  Problem Relation Age of Onset   Diabetes Mother    Stroke Father    Hypertension Father    Hearing loss Father    Colon polyps Father    Colon cancer Neg Hx    Esophageal cancer Neg Hx    Prostate cancer Neg Hx     Past Surgical History:  Procedure Laterality Date   AMPUTATION Right 01/06/2020   Procedure: RIGHT FOOT 2ND RAY AMPUTATION;  Surgeon: Newt Minion, MD;  Location: [place];  Service: Orthopedics;  Laterality: Right;   AMPUTATION Left 01/11/2021   Procedure: LEFT FOOT 3RD RAY AMPUTATION;  Surgeon: Newt Minion, MD;  Location: [place];  Service: Orthopedics;  Laterality: Left;   AMPUTATION Left 12/22/2021    Procedure: LEFT TRANSMETATARSAL AMPUTATION;  Surgeon: Newt Minion, MD;  Location: [place];  Service: Orthopedics;  Laterality: Left;   AMPUTATION Left 05/25/2022   Procedure: LEFT FOOT CHOPART;  Surgeon: Newt Minion, MD;  Location: [place];  Service: Orthopedics;  Laterality: Left;   APPLICATION OF WOUND VAC  12/22/2021   Procedure: APPLICATION OF WOUND VAC;  Surgeon: Newt Minion, MD;  Location: [place];  Service: Orthopedics;;   APPLICATION OF WOUND VAC Left 05/25/2022   Procedure: APPLICATION OF WOUND VAC;  Surgeon: Newt Minion, MD;  Location: [place];  Service: Orthopedics;  Laterality: Left;   COLONOSCOPY W/ POLYPECTOMY     CORONARY ARTERY BYPASS GRAFT N/A 06/25/2018   Procedure: CORONARY ARTERY BYPASS GRAFTING (CABG) TIMES FOUR USING LEFT INTERNAL MAMMARY ARTERY AND RIGHT ENDOSCOPICALLY HARVESTED SAPHENOUS VEIN;  Surgeon: Rexene Alberts, MD;  Location: [place];  Service: Open Heart Surgery;  Laterality: N/A;   CYSTOSCOPY WITH STENT PLACEMENT Left 12/04/2021   Procedure: CYSTOSCOPY/RETROGRADE/ STENT PLACEMENT;  Surgeon: Vira Agar, MD;  [facility];  Service: Urology;  Laterality: Left;   CYSTOSCOPY/URETEROSCOPY/HOLMIUM LASER/STENT PLACEMENT Left 01/05/2022   Procedure: CYSTOSCOPY LEFT URETERAL STENT REMOVAL  LEFT RETROGRADE PYELOGRAM URETEROSCOPY BASKET STONE EXTRACTION STENT PLACEMENT;  Surgeon: Lucas Mallow, MD;  [facility];  Service: Urology;  Laterality: Left;  1 HR FOR CASE   ENDOVEIN HARVEST OF GREATER SAPHENOUS VEIN Right 06/25/2018   Procedure: ENDOVEIN HARVEST OF GREATER SAPHENOUS VEIN;  Surgeon: Rexene Alberts, MD;  Location: [place];  Service: Open Heart Surgery;  Laterality: Right;   EXTRACORPOREAL SHOCK WAVE LITHOTRIPSY Left 12/07/2021   Procedure: EXTRACORPOREAL SHOCK WAVE LITHOTRIPSY (ESWL);  Surgeon: Festus Aloe, MD;  Location: [facility];  Service: Urology;  Laterality: Left;   LEFT HEART CATH AND CORONARY ANGIOGRAPHY N/A 06/22/2018    Procedure: LEFT HEART CATH AND CORONARY ANGIOGRAPHY;  Surgeon: Nigel Mormon, MD;  Location: [place] [facility];  Service: Cardiovascular;  Laterality: N/A;   SKIN SPLIT GRAFT Left 02/28/2022   Procedure: LEFT FOOT SKIN GRAFT;  Surgeon: Newt Minion, MD;  Location: [place];  Service: Orthopedics;  Laterality: Left;   surgical debridement  Right    Right foot ulcer   TEE WITHOUT CARDIOVERSION N/A 06/25/2018   Procedure: TRANSESOPHAGEAL ECHOCARDIOGRAM (TEE);  Surgeon: Rexene Alberts, MD;  Location: [place];  Service: Open Heart Surgery;  Laterality: N/A;   Social History   Occupational History   Occupation: attorney  Tobacco Use   Smoking status: Never   Smokeless tobacco: Never  Vaping Use   Vaping Use: Never used  Substance and Sexual Activity   Alcohol use: Yes    Comment: maybe 2  beers a month   Drug use: Never   Sexual activity: Not on file

## 2022-06-01 NOTE — Progress Notes (Signed)
The patient is status post Chopart amputation his VAC canister is again full he presents today requesting a new canister.  This was provided to him.  He understands how to apply this he will follow-up as scheduled

## 2022-06-04 ENCOUNTER — Ambulatory Visit (INDEPENDENT_AMBULATORY_CARE_PROVIDER_SITE_OTHER): Payer: 59 | Admitting: Orthopedic Surgery

## 2022-06-04 ENCOUNTER — Other Ambulatory Visit (HOSPITAL_COMMUNITY): Payer: Self-pay

## 2022-06-04 ENCOUNTER — Encounter: Payer: Self-pay | Admitting: Orthopedic Surgery

## 2022-06-04 DIAGNOSIS — Z89439 Acquired absence of unspecified foot: Secondary | ICD-10-CM

## 2022-06-04 MED ORDER — PENTOXIFYLLINE ER 400 MG PO TBCR
400.0000 mg | EXTENDED_RELEASE_TABLET | Freq: Three times a day (TID) | ORAL | 3 refills | Status: DC
  Filled 2022-06-04: qty 90, 30d supply, fill #0

## 2022-06-04 MED ORDER — SULFAMETHOXAZOLE-TRIMETHOPRIM 800-160 MG PO TABS
1.0000 | ORAL_TABLET | Freq: Two times a day (BID) | ORAL | 0 refills | Status: DC
  Filled 2022-06-04: qty 30, 15d supply, fill #0

## 2022-06-04 MED ORDER — NITROGLYCERIN 0.2 MG/HR TD PT24
0.2000 mg | MEDICATED_PATCH | Freq: Every day | TRANSDERMAL | 2 refills | Status: DC
  Filled 2022-06-04: qty 30, 30d supply, fill #0

## 2022-06-04 NOTE — Progress Notes (Signed)
Office Visit Note   [name]           Date of Birth: Jan 22, 1961           MRN: [id number] Visit Date: 06/04/2022              Requested by: Mackie Pai, PA-C [address] [place],  [place] [address] PCP: Mackie Pai, PA-C  Chief Complaint  Patient presents with   Left Foot - Routine Post Op    05/25/22 left foot chopart      HPI: Patient is a 61 year old gentleman who is status post left Chopart amputation approximately 1 week ago.  Assessment & Plan: Visit Diagnoses: No diagnosis found.  Plan: Patient has had slightly assistance of the wound.  He will proceed with Dial soap cleansing dry dressing changes nonweightbearing prescription called into the pharmacy at [facility] for Bactrim DS Trental and nitroglycerin patch.  Follow-Up Instructions: Return in about 1 week (around 06/11/2022).   Ortho Exam  Patient is alert, oriented, no adenopathy, well-dressed, normal affect, normal respiratory effort. Examination there is dehiscence of the wound.  Patient does have a palpable dorsalis pedis pulse.  The wound measures 1 x 4 cm.  There is no cellulitis or odor or drainage.  Imaging: No results found.   Labs: Lab Results  Component Value Date   HGBA1C 6.2 (H) 01/04/2022   HGBA1C 7.3 (A) 06/14/2021   HGBA1C 7.4 (A) 01/27/2021   ESRSEDRATE 36 (H) 02/12/2022   REPTSTATUS 12/27/2021 FINAL 12/22/2021   GRAMSTAIN  12/22/2021    RARE WBC PRESENT,BOTH PMN AND MONONUCLEAR NO ORGANISMS SEEN    CULT  12/22/2021    RARE METHICILLIN RESISTANT STAPHYLOCOCCUS AUREUS NO ANAEROBES ISOLATED Performed at [facility], [place] [address], [place], [place] [address]    LABORGA METHICILLIN RESISTANT STAPHYLOCOCCUS AUREUS 12/22/2021     Lab Results  Component Value Date   ALBUMIN 3.8 02/12/2022   ALBUMIN 4.3 05/01/2021   ALBUMIN 3.9 05/08/2019    Lab Results  Component Value Date   MG 2.1 05/08/2019   MG 2.2 06/26/2018   MG 3.0 (H)  06/26/2018   No results found for: "VD25OH"  No results found for: "PREALBUMIN"    Latest Ref Rng & Units 05/25/2022    6:38 AM 02/12/2022    3:04 PM 12/22/2021    5:56 AM  CBC EXTENDED  WBC 4.0 - 10.5 K/uL 6.3  9.0  7.7   RBC 4.22 - 5.81 MIL/uL 3.92  3.97  3.76   Hemoglobin 13.0 - 17.0 g/dL 10.7  12.0  11.9   HCT 39.0 - 52.0 % 33.4  36.4  34.8   Platelets 150 - 400 K/uL 371  325.0  256   NEUT# 1.4 - 7.7 K/uL  5.9    Lymph# 0.7 - 4.0 K/uL  2.1       There is no height or weight on file to calculate BMI.  Orders:  No orders of the defined types were placed in this encounter.  Meds ordered this encounter  Medications   sulfamethoxazole-trimethoprim (BACTRIM DS) 800-160 MG tablet    Sig: Take 1 tablet by mouth 2 (two) times daily.    Dispense:  30 tablet    Refill:  0   nitroGLYCERIN (NITRODUR - DOSED IN MG/24 HR) 0.2 mg/hr patch    Sig: Place 1 patch (0.2 mg total)  onto the skin to left foot daily as directed.    Dispense:  30 patch    Refill:  2   pentoxifylline (TRENTAL) 400 MG CR tablet    Sig: Take 1 tablet (400 mg total) by mouth 3 (three) times daily with meals.    Dispense:  90 tablet    Refill:  3     Procedures: No procedures performed  Clinical Data: No additional findings.  ROS:  All other systems negative, except as noted in the HPI. Review of Systems  Objective: Vital Signs: There were no vitals taken for this visit.  Specialty Comments:  No specialty comments available.  PMFS History: Patient Active Problem List   Diagnosis Date Noted   Dehiscence of amputation stump of left lower extremity ([place]) 05/25/2022   Poorly controlled type 2 diabetes mellitus with circulatory disorder ([place]) 07/15/2018   Numbness in both hands 07/15/2018   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   Acute coronary syndrome ([place]) 06/22/2018   Coronary artery disease involving native coronary artery with unstable angina pectoris ([place]) 06/22/2018   Acute myocardial infarction ([place])  06/22/2018   Hyperlipidemia    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   Vertigo 08/20/2016   Myringotomy tube status 08/20/2016   Ear fullness, left 08/20/2016   Asymmetrical left sensorineural hearing loss 02/06/2016   Neuropathy of left foot 02/03/2015   Charcot's joint of left foot, non-diabetic 02/03/2015   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   Cramping of hands 06/02/2014   Eustachian tube dysfunction 06/02/2014   HTN (hypertension) 05/07/2014   Obstructive sleep apnea 05/07/2014   Past Medical History:  Diagnosis Date   Acute myocardial infarction ([place]) 06/22/2018   Allergy    Anemia    iron deficiency   Charcot's joint of left foot, non-diabetic    Coronary artery disease involving native coronary artery with unstable angina pectoris ([place]) 06/22/2018   Diabetes ([place])    type 2   GERD (gastroesophageal reflux disease)    History of kidney stones    passed   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 ([place]) 01/18/2021   Hyperlipidemia    Hypertension    Neuropathy of left foot    Nodular basal cell carcinoma (BCC) 07/10/2019   Right V of Neck (curet and 5FU)   Osteomyelitis of third toe of left foot ([place])    Pneumonia    As a child   Right foot ulcer ([place])    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   LIMA to LAD, SVG to Diag, SVG to OM2, SVG to [name], EVH via right thigh and leg   Subacute osteomyelitis, left ankle and foot ([place]) 05/25/2022   Type II diabetes mellitus with complication, uncontrolled    Uncontrolled type 2 diabetes mellitus     Family History  Problem Relation Age of Onset   Diabetes Mother    Stroke Father    Hypertension Father    Hearing loss Father    Colon polyps Father    Colon cancer Neg Hx    Esophageal cancer Neg Hx    Prostate cancer Neg Hx     Past Surgical History:  Procedure Laterality Date   AMPUTATION Right 01/06/2020   Procedure: RIGHT FOOT 2ND RAY AMPUTATION;  Surgeon: Newt Minion, MD;  Location: [place];   Service: Orthopedics;  Laterality: Right;   AMPUTATION Left 01/11/2021   Procedure: LEFT FOOT 3RD RAY AMPUTATION;  Surgeon: Newt Minion, MD;  Location: [place];  Service: Orthopedics;  Laterality: Left;   AMPUTATION Left 12/22/2021  Procedure: LEFT TRANSMETATARSAL AMPUTATION;  Surgeon: Newt Minion, MD;  Location: [place];  Service: Orthopedics;  Laterality: Left;   AMPUTATION Left 05/25/2022   Procedure: LEFT FOOT CHOPART;  Surgeon: Newt Minion, MD;  Location: [place];  Service: Orthopedics;  Laterality: Left;   APPLICATION OF WOUND VAC  12/22/2021   Procedure: APPLICATION OF WOUND VAC;  Surgeon: Newt Minion, MD;  Location: [place];  Service: Orthopedics;;   APPLICATION OF WOUND VAC Left 05/25/2022   Procedure: APPLICATION OF WOUND VAC;  Surgeon: Newt Minion, MD;  Location: [place];  Service: Orthopedics;  Laterality: Left;   COLONOSCOPY W/ POLYPECTOMY     CORONARY ARTERY BYPASS GRAFT N/A 06/25/2018   Procedure: CORONARY ARTERY BYPASS GRAFTING (CABG) TIMES FOUR USING LEFT INTERNAL MAMMARY ARTERY AND RIGHT ENDOSCOPICALLY HARVESTED SAPHENOUS VEIN;  Surgeon: Rexene Alberts, MD;  Location: [place];  Service: Open Heart Surgery;  Laterality: N/A;   CYSTOSCOPY WITH STENT PLACEMENT Left 12/04/2021   Procedure: CYSTOSCOPY/RETROGRADE/ STENT PLACEMENT;  Surgeon: Vira Agar, MD;  Location: [facility];  Service: Urology;  Laterality: Left;   CYSTOSCOPY/URETEROSCOPY/HOLMIUM LASER/STENT PLACEMENT Left 01/05/2022   Procedure: CYSTOSCOPY LEFT URETERAL STENT REMOVAL  LEFT RETROGRADE PYELOGRAM URETEROSCOPY BASKET STONE EXTRACTION STENT PLACEMENT;  Surgeon: Lucas Mallow, MD;  Location: [facility];  Service: Urology;  Laterality: Left;  1 HR FOR CASE   ENDOVEIN HARVEST OF GREATER SAPHENOUS VEIN Right 06/25/2018   Procedure: ENDOVEIN HARVEST OF GREATER SAPHENOUS VEIN;  Surgeon: Rexene Alberts, MD;  Location: [place];  Service: Open Heart Surgery;  Laterality: Right;   EXTRACORPOREAL SHOCK WAVE LITHOTRIPSY Left  12/07/2021   Procedure: EXTRACORPOREAL SHOCK WAVE LITHOTRIPSY (ESWL);  Surgeon: Festus Aloe, MD;  Location[facility];  Service: Urology;  Laterality: Left;   LEFT HEART CATH AND CORONARY ANGIOGRAPHY N/A 06/22/2018   Procedure: LEFT HEART CATH AND CORONARY ANGIOGRAPHY;  Surgeon: Nigel Mormon, MD;  Location: [facility];  Service: Cardiovascular;  Laterality: N/A;   SKIN SPLIT GRAFT Left 02/28/2022   Procedure: LEFT FOOT SKIN GRAFT;  Surgeon: Newt Minion, MD;  Location: [place];  Service: Orthopedics;  Laterality: Left;   surgical debridement  Right    Right foot ulcer   TEE WITHOUT CARDIOVERSION N/A 06/25/2018   Procedure: TRANSESOPHAGEAL ECHOCARDIOGRAM (TEE);  Surgeon: Rexene Alberts, MD;  Location: [place];  Service: Open Heart Surgery;  Laterality: N/A;   Social History   Occupational History   Occupation: attorney  Tobacco Use   Smoking status: Never   Smokeless tobacco: Never  Vaping Use   Vaping Use: Never used  Substance and Sexual Activity   Alcohol use: Yes    Comment: maybe 2 beers a month   Drug use: Never   Sexual activity: Not on file

## 2022-06-06 ENCOUNTER — Encounter: Payer: 59 | Admitting: Family

## 2022-06-11 ENCOUNTER — Ambulatory Visit (INDEPENDENT_AMBULATORY_CARE_PROVIDER_SITE_OTHER): Payer: 59 | Admitting: Orthopedic Surgery

## 2022-06-11 ENCOUNTER — Other Ambulatory Visit (HOSPITAL_COMMUNITY): Payer: Self-pay

## 2022-06-11 DIAGNOSIS — T8781 Dehiscence of amputation stump: Secondary | ICD-10-CM

## 2022-06-11 DIAGNOSIS — Z89439 Acquired absence of unspecified foot: Secondary | ICD-10-CM

## 2022-06-11 MED ORDER — GABAPENTIN 300 MG PO CAPS
300.0000 mg | ORAL_CAPSULE | Freq: Every day | ORAL | 3 refills | Status: DC
  Filled 2022-06-11: qty 30, 10d supply, fill #0
  Filled 2022-07-15 – 2022-07-17 (×2): qty 30, 10d supply, fill #1
  Filled 2022-08-06 – 2022-08-15 (×2): qty 30, 10d supply, fill #2
  Filled 2022-08-17: qty 30, 10d supply, fill #0
  Filled 2022-09-05: qty 30, 10d supply, fill #1

## 2022-06-12 ENCOUNTER — Encounter: Payer: Self-pay | Admitting: Orthopedic Surgery

## 2022-06-12 NOTE — Progress Notes (Signed)
Office Visit Note   [name]           Date of Birth: 14-Jan-1961           MRN: [id number] Visit Date: 06/11/2022              Requested by: Mackie Pai, PA-C [place],  [place] [address] PCP: Mackie Pai, PA-C  Chief Complaint  Patient presents with   Left Foot - Routine Post Op    05/25/2022 left Chopart amputation with Kerecis graft       HPI: Patient is a 61 year old gentleman who presents 2 weeks status post left Chopart amputation with tissue reinforcement with Kerecis graft.  Patient presents with progressive wound dehiscence.  Assessment & Plan: Visit Diagnoses:  1. History of Chopart amputation ([place])   2. Dehiscence of amputation stump of left lower extremity ([name]     Plan: Despite patient having a good pulse at the ankle both dorsalis pedis and posterior tibial patient has had progressive wound dehiscence.  Recommended proceeding with a transtibial amputation.  Patient was also given a prescription for Neurontin 300 mg nightly for his neuropathy in both upper extremities.  Follow-Up Instructions: Return in about 1 week (around 06/18/2022).   Ortho Exam  Patient is alert, oriented, no adenopathy, well-dressed, normal affect, normal respiratory effort. Examination of the left lower extremity patient has excellent hair growth to the ankle.  Patient has a palpable dorsalis pedis and posterior tibial pulse.  The Doppler was used and he has a strong multiphasic dorsalis pedis and posterior tibial pulse.  No evidence of any acute vascular insufficiency.  The surgical wound shows progressive dehiscence there is no cellulitis odor or drainage however there is a large wound dehiscence.  Patient is currently on Bactrim DS and is nonweightbearing in a kneeling scooter.  Imaging: No results found.   Labs: Lab Results  Component Value Date   HGBA1C 6.2 (H) 01/04/2022   HGBA1C 7.3 (A) 06/14/2021   HGBA1C 7.4 (A) 01/27/2021    ESRSEDRATE 36 (H) 02/12/2022   REPTSTATUS 12/27/2021 FINAL 12/22/2021   GRAMSTAIN  12/22/2021    RARE WBC PRESENT,BOTH PMN AND MONONUCLEAR NO ORGANISMS SEEN    CULT  12/22/2021    RARE METHICILLIN RESISTANT STAPHYLOCOCCUS AUREUS NO ANAEROBES ISOLATED Performed at [facility], [place] [address], [place], [place] [address]    LABORGA METHICILLIN RESISTANT STAPHYLOCOCCUS AUREUS 12/22/2021     Lab Results  Component Value Date   ALBUMIN 3.8 02/12/2022   ALBUMIN 4.3 05/01/2021   ALBUMIN 3.9 05/08/2019    Lab Results  Component Value Date   MG 2.1 05/08/2019   MG 2.2 06/26/2018   MG 3.0 (H) 06/26/2018   No results found for: "VD25OH"  No results found for: "PREALBUMIN"    Latest Ref Rng & Units 05/25/2022    6:38 AM 02/12/2022    3:04 PM 12/22/2021    5:56 AM  CBC EXTENDED  WBC 4.0 - 10.5 K/uL 6.3  9.0  7.7   RBC 4.22 - 5.81 MIL/uL 3.92  3.97  3.76   Hemoglobin 13.0 - 17.0 g/dL 10.7  12.0  11.9   HCT 39.0 - 52.0 % 33.4  36.4  34.8   Platelets 150 - 400 K/uL 371  325.0  256   NEUT# 1.4 - 7.7 K/uL  5.9    Lymph# 0.7 - 4.0 K/uL  2.1       There is no  height or weight on file to calculate BMI.  Orders:  No orders of the defined types were placed in this encounter.  Meds ordered this encounter  Medications   gabapentin (NEURONTIN) 300 MG capsule    Sig: Take 1 capsule (300 mg total) by mouth at bedtime. up to 3 times a day when necessary neuropathy pain    Dispense:  30 capsule    Refill:  3     Procedures: No procedures performed  Clinical Data: No additional findings.  ROS:  All other systems negative, except as noted in the HPI. Review of Systems  Objective: Vital Signs: There were no vitals taken for this visit.  Specialty Comments:  No specialty comments available.  PMFS History: Patient Active Problem List   Diagnosis Date Noted   Dehiscence of amputation stump of left lower extremity ([place]) 05/25/2022   Poorly controlled type 2 diabetes  mellitus with circulatory disorder ([place]) 07/15/2018   Numbness in both hands 07/15/2018   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   Acute coronary syndrome ([place]) 06/22/2018   Coronary artery disease involving native coronary artery with unstable angina pectoris ([place]) 06/22/2018   Acute myocardial infarction ([place]) 06/22/2018   Hyperlipidemia    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   Vertigo 08/20/2016   Myringotomy tube status 08/20/2016   Ear fullness, left 08/20/2016   Asymmetrical left sensorineural hearing loss 02/06/2016   Neuropathy of left foot 02/03/2015   Charcot's joint of left foot, non-diabetic 02/03/2015   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   Cramping of hands 06/02/2014   Eustachian tube dysfunction 06/02/2014   HTN (hypertension) 05/07/2014   Obstructive sleep apnea 05/07/2014   Past Medical History:  Diagnosis Date   Acute myocardial infarction ([place]) 06/22/2018   Allergy    Anemia    iron deficiency   Charcot's joint of left foot, non-diabetic    Coronary artery disease involving native coronary artery with unstable angina pectoris ([place]) 06/22/2018   Diabetes ([place])    type 2   GERD (gastroesophageal reflux disease)    History of kidney stones    passed   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 ([place]) 01/18/2021   Hyperlipidemia    Hypertension    Neuropathy of left foot    Nodular basal cell carcinoma (BCC) 07/10/2019   Right V of Neck (curet and 5FU)   Osteomyelitis of third toe of left foot ([place])    Pneumonia    As a child   Right foot ulcer ([place])    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   LIMA to LAD, SVG to Diag, SVG to OM2, SVG to [name], EVH via right thigh and leg   Subacute osteomyelitis, left ankle and foot ([place]) 05/25/2022   Type II diabetes mellitus with complication, uncontrolled    Uncontrolled type 2 diabetes mellitus     Family History  Problem Relation Age of Onset   Diabetes Mother    Stroke Father    Hypertension Father     Hearing loss Father    Colon polyps Father    Colon cancer Neg Hx    Esophageal cancer Neg Hx    Prostate cancer Neg Hx     Past Surgical History:  Procedure Laterality Date   AMPUTATION Right 01/06/2020   Procedure: RIGHT FOOT 2ND RAY AMPUTATION;  Surgeon: Newt Minion, MD;  Location: [place];  Service: Orthopedics;  Laterality: Right;   AMPUTATION Left 01/11/2021   Procedure:  LEFT FOOT 3RD RAY AMPUTATION;  Surgeon: Newt Minion, MD;  Location: [place];  Service: Orthopedics;  Laterality: Left;   AMPUTATION Left 12/22/2021   Procedure: LEFT TRANSMETATARSAL AMPUTATION;  Surgeon: Newt Minion, MD;  Location: [facility];  Service: Orthopedics;  Laterality: Left;   AMPUTATION Left 05/25/2022   Procedure: LEFT FOOT CHOPART;  Surgeon: Newt Minion, MD;  Location: [facility];  Service: Orthopedics;  Laterality: Left;   APPLICATION OF WOUND VAC  12/22/2021   Procedure: APPLICATION OF WOUND VAC;  Surgeon: Newt Minion, MD;  Location: [place];  Service: Orthopedics;;   APPLICATION OF WOUND VAC Left 05/25/2022   Procedure: APPLICATION OF WOUND VAC;  Surgeon: Newt Minion, MD;  Location: [facility];  Service: Orthopedics;  Laterality: Left;   COLONOSCOPY W/ POLYPECTOMY     CORONARY ARTERY BYPASS GRAFT N/A 06/25/2018   Procedure: CORONARY ARTERY BYPASS GRAFTING (CABG) TIMES FOUR USING LEFT INTERNAL MAMMARY ARTERY AND RIGHT ENDOSCOPICALLY HARVESTED SAPHENOUS VEIN;  Surgeon: Rexene Alberts, MD;  Location: [facility];  Service: Open Heart Surgery;  Laterality: N/A;   CYSTOSCOPY WITH STENT PLACEMENT Left 12/04/2021   Procedure: CYSTOSCOPY/RETROGRADE/ STENT PLACEMENT;  Surgeon: Vira Agar, MD;  [facility];  Service: Urology;  Laterality: Left;   CYSTOSCOPY/URETEROSCOPY/HOLMIUM LASER/STENT PLACEMENT Left 01/05/2022   Procedure: CYSTOSCOPY LEFT URETERAL STENT REMOVAL  LEFT RETROGRADE PYELOGRAM URETEROSCOPY BASKET STONE EXTRACTION STENT PLACEMENT;  Surgeon: Lucas Mallow, MD;  [facility];  Service:  Urology;  Laterality: Left;  1 HR FOR CASE   ENDOVEIN HARVEST OF GREATER SAPHENOUS VEIN Right 06/25/2018   Procedure: ENDOVEIN HARVEST OF GREATER SAPHENOUS VEIN;  Surgeon: Rexene Alberts, MD;  Location: [place];  Service: Open Heart Surgery;  Laterality: Right;   EXTRACORPOREAL SHOCK WAVE LITHOTRIPSY Left 12/07/2021   Procedure: EXTRACORPOREAL SHOCK WAVE LITHOTRIPSY (ESWL);  Surgeon: Festus Aloe, MD;  Location[facility];  Service: Urology;  Laterality: Left;   LEFT HEART CATH AND CORONARY ANGIOGRAPHY N/A 06/22/2018   Procedure: LEFT HEART CATH AND CORONARY ANGIOGRAPHY;  Surgeon: Nigel Mormon, MD;  Location: [facility];  Service: Cardiovascular;  Laterality: N/A;   SKIN SPLIT GRAFT Left 02/28/2022   Procedure: LEFT FOOT SKIN GRAFT;  Surgeon: Newt Minion, MD;  Location: [facility];  Service: Orthopedics;  Laterality: Left;   surgical debridement  Right    Right foot ulcer   TEE WITHOUT CARDIOVERSION N/A 06/25/2018   Procedure: TRANSESOPHAGEAL ECHOCARDIOGRAM (TEE);  Surgeon: Rexene Alberts, MD;  Location: [place] [facility];  Service: Open Heart Surgery;  Laterality: N/A;   Social History   Occupational History   Occupation: attorney  Tobacco Use   Smoking status: Never   Smokeless tobacco: Never  Vaping Use   Vaping Use: Never used  Substance and Sexual Activity   Alcohol use: Yes    Comment: maybe 2 beers a month   Drug use: Never   Sexual activity: Not on file

## 2022-06-14 ENCOUNTER — Other Ambulatory Visit: Payer: Self-pay

## 2022-06-14 NOTE — Progress Notes (Signed)
Joshua Branch denies chest pain or shortness of breath.  Patient denies having any s/s of Covid in his household, also denies any known exposure to Covid.   Joshua Branch has type II diabetes, I instructed patient to not take Glipizide or Metformin in am. I instructed patient to check CBG after awaking and every 2 hours until arrival  to the hospital.  I Instructed patient if CBG is less than 70 to take 4 Glucose Tablets or 1 tube of Glucose Gel or 1/2 cup of a clear juice. Recheck CBG in 15 minutes if CBG is not over 70 call, pre- op desk at [phone] for further instructions. If sch

## 2022-06-15 ENCOUNTER — Inpatient Hospital Stay (HOSPITAL_COMMUNITY): Payer: 59 | Admitting: Anesthesiology

## 2022-06-15 ENCOUNTER — Encounter (HOSPITAL_COMMUNITY)
Admission: RE | Disposition: A | Discharge status: Home Health | Payer: Self-pay | Source: Ambulatory Visit | Attending: Orthopedic Surgery

## 2022-06-15 ENCOUNTER — Other Ambulatory Visit: Payer: Self-pay

## 2022-06-15 ENCOUNTER — Inpatient Hospital Stay (HOSPITAL_COMMUNITY)
Admission: RE | Admit: 2022-06-15 | Discharge: 2022-06-18 | DRG: 475 | Disposition: A | Discharge status: Home Health | Payer: 59 | Source: Ambulatory Visit | Attending: Orthopedic Surgery | Admitting: Orthopedic Surgery

## 2022-06-15 ENCOUNTER — Encounter: Payer: 59 | Admitting: Family

## 2022-06-15 DIAGNOSIS — E1142 Type 2 diabetes mellitus with diabetic polyneuropathy: Secondary | ICD-10-CM | POA: Diagnosis present

## 2022-06-15 DIAGNOSIS — I2510 Atherosclerotic heart disease of native coronary artery without angina pectoris: Secondary | ICD-10-CM

## 2022-06-15 DIAGNOSIS — Z85828 Personal history of other malignant neoplasm of skin: Secondary | ICD-10-CM | POA: Diagnosis not present

## 2022-06-15 DIAGNOSIS — K219 Gastro-esophageal reflux disease without esophagitis: Secondary | ICD-10-CM | POA: Diagnosis present

## 2022-06-15 DIAGNOSIS — I252 Old myocardial infarction: Secondary | ICD-10-CM

## 2022-06-15 DIAGNOSIS — Z823 Family history of stroke: Secondary | ICD-10-CM

## 2022-06-15 DIAGNOSIS — M869 Osteomyelitis, unspecified: Secondary | ICD-10-CM | POA: Diagnosis present

## 2022-06-15 DIAGNOSIS — Z833 Family history of diabetes mellitus: Secondary | ICD-10-CM | POA: Diagnosis not present

## 2022-06-15 DIAGNOSIS — E1169 Type 2 diabetes mellitus with other specified complication: Secondary | ICD-10-CM | POA: Diagnosis present

## 2022-06-15 DIAGNOSIS — Z7984 Long term (current) use of oral hypoglycemic drugs: Secondary | ICD-10-CM | POA: Diagnosis not present

## 2022-06-15 DIAGNOSIS — I10 Essential (primary) hypertension: Principal | ICD-10-CM

## 2022-06-15 DIAGNOSIS — E1165 Type 2 diabetes mellitus with hyperglycemia: Secondary | ICD-10-CM

## 2022-06-15 DIAGNOSIS — T8781 Dehiscence of amputation stump: Secondary | ICD-10-CM | POA: Diagnosis present

## 2022-06-15 DIAGNOSIS — Z8249 Family history of ischemic heart disease and other diseases of the circulatory system: Secondary | ICD-10-CM | POA: Diagnosis not present

## 2022-06-15 DIAGNOSIS — Z951 Presence of aortocoronary bypass graft: Secondary | ICD-10-CM | POA: Diagnosis not present

## 2022-06-15 DIAGNOSIS — E1159 Type 2 diabetes mellitus with other circulatory complications: Secondary | ICD-10-CM

## 2022-06-15 DIAGNOSIS — E1151 Type 2 diabetes mellitus with diabetic peripheral angiopathy without gangrene: Secondary | ICD-10-CM | POA: Diagnosis present

## 2022-06-15 DIAGNOSIS — Y835 Amputation of limb(s) as the cause of abnormal reaction of the patient, or of later complication, without mention of misadventure at the time of the procedure: Secondary | ICD-10-CM | POA: Diagnosis present

## 2022-06-15 DIAGNOSIS — Z89512 Acquired absence of left leg below knee: Secondary | ICD-10-CM

## 2022-06-15 HISTORY — PX: AMPUTATION: SHX166

## 2022-06-15 LAB — GLUCOSE, CAPILLARY
Glucose-Capillary: 167 mg/dL — ABNORMAL HIGH (ref 70–99)
Glucose-Capillary: 154 mg/dL — ABNORMAL HIGH (ref 70–99)
Glucose-Capillary: 240 mg/dL — ABNORMAL HIGH (ref 70–99)

## 2022-06-15 SURGERY — AMPUTATION BELOW KNEE
Anesthesia: Regional | Site: Knee | Laterality: Left

## 2022-06-15 MED ORDER — ATORVASTATIN CALCIUM 80 MG PO TABS
80.0000 mg | ORAL_TABLET | Freq: Every day | ORAL | Status: DC
  Administered 2022-06-15 – 2022-06-18 (×4): 80 mg via ORAL
  Filled 2022-06-15 (×4): qty 1

## 2022-06-15 MED ORDER — PHENOL 1.4 % MT LIQD: 1.0000 | OROMUCOSAL | Status: DC | PRN

## 2022-06-15 MED ORDER — SODIUM CHLORIDE 0.9 % IV SOLN: INTRAVENOUS | Status: DC

## 2022-06-15 MED ORDER — DEXAMETHASONE SODIUM PHOSPHATE 4 MG/ML IJ SOLN
INTRAMUSCULAR | Status: DC | PRN
  Administered 2022-06-15: 2 mg via PERINEURAL
  Administered 2022-06-15: 3 mg via PERINEURAL

## 2022-06-15 MED ORDER — ONDANSETRON HCL 4 MG/2ML IJ SOLN
INTRAMUSCULAR | Status: AC
  Filled 2022-06-15: qty 2

## 2022-06-15 MED ORDER — ORAL CARE MOUTH RINSE: 15.0000 mL | Freq: Once | OROMUCOSAL | Status: AC

## 2022-06-15 MED ORDER — HYDROMORPHONE HCL 1 MG/ML IJ SOLN
0.5000 mg | INTRAMUSCULAR | Status: DC | PRN
  Administered 2022-06-16 – 2022-06-17 (×3): 1 mg via INTRAVENOUS
  Filled 2022-06-15 (×5): qty 1

## 2022-06-15 MED ORDER — HYDROCHLOROTHIAZIDE 12.5 MG PO TABS
12.5000 mg | ORAL_TABLET | Freq: Every day | ORAL | Status: DC
  Administered 2022-06-15 – 2022-06-18 (×4): 12.5 mg via ORAL
  Filled 2022-06-15 (×4): qty 1

## 2022-06-15 MED ORDER — CLONIDINE HCL (ANALGESIA) 100 MCG/ML EP SOLN
EPIDURAL | Status: DC | PRN
  Administered 2022-06-15: 30 ug
  Administered 2022-06-15: 50 ug

## 2022-06-15 MED ORDER — MEPERIDINE HCL 25 MG/ML IJ SOLN: 6.2500 mg | INTRAMUSCULAR | Status: DC | PRN

## 2022-06-15 MED ORDER — ROPIVACAINE HCL 5 MG/ML IJ SOLN: INTRAMUSCULAR | Status: DC | PRN

## 2022-06-15 MED ORDER — LABETALOL HCL 5 MG/ML IV SOLN: 10.0000 mg | INTRAVENOUS | Status: DC | PRN

## 2022-06-15 MED ORDER — OXYCODONE HCL 5 MG PO TABS
10.0000 mg | ORAL_TABLET | ORAL | Status: DC | PRN
  Administered 2022-06-16 – 2022-06-17 (×6): 15 mg via ORAL
  Filled 2022-06-15 (×6): qty 3

## 2022-06-15 MED ORDER — LISINOPRIL 5 MG PO TABS
5.0000 mg | ORAL_TABLET | Freq: Every day | ORAL | Status: DC
  Administered 2022-06-15 – 2022-06-18 (×4): 5 mg via ORAL
  Filled 2022-06-15 (×4): qty 1

## 2022-06-15 MED ORDER — ALUM & MAG HYDROXIDE-SIMETH 200-200-20 MG/5ML PO SUSP: 15.0000 mL | ORAL | Status: DC | PRN

## 2022-06-15 MED ORDER — OXYCODONE HCL 5 MG PO TABS
5.0000 mg | ORAL_TABLET | ORAL | Status: DC | PRN
  Administered 2022-06-17: 10 mg via ORAL
  Filled 2022-06-15 (×2): qty 2

## 2022-06-15 MED ORDER — PROPOFOL 500 MG/50ML IV EMUL
INTRAVENOUS | Status: DC | PRN
  Administered 2022-06-15: 125 ug/kg/min via INTRAVENOUS

## 2022-06-15 MED ORDER — PHENYLEPHRINE HCL (PRESSORS) 10 MG/ML IV SOLN
INTRAVENOUS | Status: DC | PRN
  Administered 2022-06-15 (×2): 80 ug via INTRAVENOUS

## 2022-06-15 MED ORDER — ALPRAZOLAM 0.5 MG PO TABS
0.5000 mg | ORAL_TABLET | Freq: Three times a day (TID) | ORAL | Status: DC | PRN
  Administered 2022-06-16 – 2022-06-17 (×2): 0.5 mg via ORAL
  Filled 2022-06-15 (×2): qty 1

## 2022-06-15 MED ORDER — GABAPENTIN 300 MG PO CAPS
300.0000 mg | ORAL_CAPSULE | Freq: Every day | ORAL | Status: DC
  Administered 2022-06-15 – 2022-06-17 (×3): 300 mg via ORAL
  Filled 2022-06-15 (×3): qty 1

## 2022-06-15 MED ORDER — ASPIRIN 81 MG PO TBEC
81.0000 mg | DELAYED_RELEASE_TABLET | Freq: Every day | ORAL | Status: DC
  Administered 2022-06-15 – 2022-06-18 (×4): 81 mg via ORAL
  Filled 2022-06-15 (×4): qty 1

## 2022-06-15 MED ORDER — DOCUSATE SODIUM 100 MG PO CAPS
100.0000 mg | ORAL_CAPSULE | Freq: Every day | ORAL | Status: DC
  Administered 2022-06-16 – 2022-06-18 (×3): 100 mg via ORAL
  Filled 2022-06-15 (×3): qty 1

## 2022-06-15 MED ORDER — INSULIN ASPART 100 UNIT/ML IJ SOLN
0.0000 [IU] | INTRAMUSCULAR | Status: DC | PRN
  Administered 2022-06-15: 2 [IU] via SUBCUTANEOUS
  Filled 2022-06-15: qty 1

## 2022-06-15 MED ORDER — 0.9 % SODIUM CHLORIDE (POUR BTL) OPTIME
TOPICAL | Status: DC | PRN
  Administered 2022-06-15: 1000 mL

## 2022-06-15 MED ORDER — ONDANSETRON HCL 4 MG/2ML IJ SOLN
INTRAMUSCULAR | Status: DC | PRN
  Administered 2022-06-15: 4 mg via INTRAVENOUS

## 2022-06-15 MED ORDER — PROMETHAZINE HCL 25 MG/ML IJ SOLN: 6.2500 mg | INTRAMUSCULAR | Status: DC | PRN

## 2022-06-15 MED ORDER — POTASSIUM CHLORIDE CRYS ER 20 MEQ PO TBCR: 20.0000 meq | EXTENDED_RELEASE_TABLET | Freq: Every day | ORAL | Status: DC | PRN

## 2022-06-15 MED ORDER — CEFAZOLIN SODIUM-DEXTROSE 2-4 GM/100ML-% IV SOLN
2.0000 g | INTRAVENOUS | Status: AC
  Administered 2022-06-15: 2 g via INTRAVENOUS
  Filled 2022-06-15: qty 100

## 2022-06-15 MED ORDER — FENTANYL CITRATE (PF) 100 MCG/2ML IJ SOLN: 25.0000 ug | INTRAMUSCULAR | Status: DC | PRN

## 2022-06-15 MED ORDER — CHLORHEXIDINE GLUCONATE 0.12 % MT SOLN
15.0000 mL | Freq: Once | OROMUCOSAL | Status: AC
  Administered 2022-06-15: 15 mL via OROMUCOSAL
  Filled 2022-06-15: qty 15

## 2022-06-15 MED ORDER — MIDAZOLAM HCL 2 MG/2ML IJ SOLN
INTRAMUSCULAR | Status: AC
  Administered 2022-06-15: 1 mg
  Filled 2022-06-15: qty 2

## 2022-06-15 MED ORDER — ONDANSETRON HCL 4 MG/2ML IJ SOLN: 4.0000 mg | Freq: Four times a day (QID) | INTRAMUSCULAR | Status: DC | PRN

## 2022-06-15 MED ORDER — FENTANYL CITRATE (PF) 100 MCG/2ML IJ SOLN
INTRAMUSCULAR | Status: AC
  Administered 2022-06-15: 50 ug
  Filled 2022-06-15: qty 2

## 2022-06-15 MED ORDER — BISACODYL 5 MG PO TBEC: 5.0000 mg | DELAYED_RELEASE_TABLET | Freq: Every day | ORAL | Status: DC | PRN

## 2022-06-15 MED ORDER — METOPROLOL TARTRATE 5 MG/5ML IV SOLN: 2.0000 mg | INTRAVENOUS | Status: DC | PRN

## 2022-06-15 MED ORDER — ACETAMINOPHEN 500 MG PO TABS
1000.0000 mg | ORAL_TABLET | Freq: Once | ORAL | Status: AC
  Administered 2022-06-15: 1000 mg via ORAL
  Filled 2022-06-15: qty 2

## 2022-06-15 MED ORDER — ROPIVACAINE HCL 5 MG/ML IJ SOLN
INTRAMUSCULAR | Status: DC | PRN
  Administered 2022-06-15: 12 mL via PERINEURAL
  Administered 2022-06-15: 18 mL via PERINEURAL

## 2022-06-15 MED ORDER — GUAIFENESIN-DM 100-10 MG/5ML PO SYRP: 15.0000 mL | ORAL_SOLUTION | ORAL | Status: DC | PRN

## 2022-06-15 MED ORDER — MAGNESIUM CITRATE PO SOLN: 1.0000 | Freq: Once | ORAL | Status: DC | PRN

## 2022-06-15 MED ORDER — GLIPIZIDE ER 5 MG PO TB24
5.0000 mg | ORAL_TABLET | Freq: Every day | ORAL | Status: DC
  Administered 2022-06-16 – 2022-06-18 (×3): 5 mg via ORAL
  Filled 2022-06-15 (×3): qty 1

## 2022-06-15 MED ORDER — JUVEN PO PACK
1.0000 | PACK | Freq: Two times a day (BID) | ORAL | Status: DC
  Administered 2022-06-16 – 2022-06-17 (×4): 1 via ORAL
  Filled 2022-06-15 (×3): qty 1

## 2022-06-15 MED ORDER — METOPROLOL TARTRATE 25 MG PO TABS
25.0000 mg | ORAL_TABLET | Freq: Two times a day (BID) | ORAL | Status: DC
  Administered 2022-06-16 – 2022-06-18 (×5): 25 mg via ORAL
  Filled 2022-06-15 (×6): qty 1

## 2022-06-15 MED ORDER — FENTANYL CITRATE (PF) 250 MCG/5ML IJ SOLN
INTRAMUSCULAR | Status: AC
  Filled 2022-06-15: qty 5

## 2022-06-15 MED ORDER — CEFAZOLIN SODIUM-DEXTROSE 2-4 GM/100ML-% IV SOLN
2.0000 g | Freq: Three times a day (TID) | INTRAVENOUS | Status: AC
  Administered 2022-06-15 – 2022-06-16 (×2): 2 g via INTRAVENOUS
  Filled 2022-06-15 (×2): qty 100

## 2022-06-15 MED ORDER — PHENYLEPHRINE 80 MCG/ML (10ML) SYRINGE FOR IV PUSH (FOR BLOOD PRESSURE SUPPORT)
PREFILLED_SYRINGE | INTRAVENOUS | Status: AC
  Filled 2022-06-15: qty 10

## 2022-06-15 MED ORDER — POLYETHYLENE GLYCOL 3350 17 G PO PACK: 17.0000 g | PACK | Freq: Every day | ORAL | Status: DC | PRN

## 2022-06-15 MED ORDER — ROPIVACAINE HCL 7.5 MG/ML IJ SOLN
INTRAMUSCULAR | Status: DC | PRN
  Administered 2022-06-15: 12 mL via PERINEURAL
  Administered 2022-06-15: 8 mL via PERINEURAL

## 2022-06-15 MED ORDER — PANTOPRAZOLE SODIUM 40 MG PO TBEC
40.0000 mg | DELAYED_RELEASE_TABLET | Freq: Every day | ORAL | Status: DC
  Administered 2022-06-16 – 2022-06-18 (×3): 40 mg via ORAL
  Filled 2022-06-15 (×4): qty 1

## 2022-06-15 MED ORDER — MAGNESIUM SULFATE 2 GM/50ML IV SOLN: 2.0000 g | Freq: Every day | INTRAVENOUS | Status: DC | PRN

## 2022-06-15 MED ORDER — ZINC SULFATE 220 (50 ZN) MG PO CAPS
220.0000 mg | ORAL_CAPSULE | Freq: Every day | ORAL | Status: DC
  Administered 2022-06-15 – 2022-06-18 (×4): 220 mg via ORAL
  Filled 2022-06-15 (×4): qty 1

## 2022-06-15 MED ORDER — LACTATED RINGERS IV SOLN: INTRAVENOUS | Status: DC

## 2022-06-15 MED ORDER — DEXAMETHASONE SODIUM PHOSPHATE 10 MG/ML IJ SOLN
INTRAMUSCULAR | Status: AC
  Filled 2022-06-15: qty 1

## 2022-06-15 MED ORDER — VITAMIN C 500 MG PO TABS
1000.0000 mg | ORAL_TABLET | Freq: Every day | ORAL | Status: DC
  Administered 2022-06-15 – 2022-06-18 (×4): 1000 mg via ORAL
  Filled 2022-06-15 (×4): qty 2

## 2022-06-15 MED ORDER — ACETAMINOPHEN 325 MG PO TABS
325.0000 mg | ORAL_TABLET | Freq: Four times a day (QID) | ORAL | Status: DC | PRN
  Administered 2022-06-17: 650 mg via ORAL
  Filled 2022-06-15 (×2): qty 2

## 2022-06-15 MED ORDER — PROPOFOL 10 MG/ML IV BOLUS
INTRAVENOUS | Status: DC | PRN
  Administered 2022-06-15: 30 mg via INTRAVENOUS

## 2022-06-15 MED ORDER — HYDRALAZINE HCL 20 MG/ML IJ SOLN: 5.0000 mg | INTRAMUSCULAR | Status: DC | PRN

## 2022-06-15 MED ORDER — METFORMIN HCL 500 MG PO TABS
500.0000 mg | ORAL_TABLET | Freq: Two times a day (BID) | ORAL | Status: DC
  Administered 2022-06-16 – 2022-06-18 (×5): 500 mg via ORAL
  Filled 2022-06-15 (×5): qty 1

## 2022-06-15 SURGICAL SUPPLY — 38 items
BAG COUNTER SPONGE SURGICOUNT (BAG) IMPLANT
BLADE SAW RECIP 87.9 MT (BLADE) ×1 IMPLANT
BLADE SURG 21 STRL SS (BLADE) ×1 IMPLANT
BNDG COHESIVE 6X5 TAN STRL LF (GAUZE/BANDAGES/DRESSINGS) IMPLANT
CANISTER WOUND CARE 500ML ATS (WOUND CARE) ×1 IMPLANT
COVER SURGICAL LIGHT HANDLE (MISCELLANEOUS) ×1 IMPLANT
CUFF TOURN SGL QUICK 34 (TOURNIQUET CUFF) ×1
CUFF TRNQT CYL 34X4.125X (TOURNIQUET CUFF) ×1 IMPLANT
DRAPE INCISE IOBAN 66X45 STRL (DRAPES) ×1 IMPLANT
DRAPE U-SHAPE 47X51 STRL (DRAPES) ×1 IMPLANT
DRESSING PREVENA PLUS CUSTOM (GAUZE/BANDAGES/DRESSINGS) ×1 IMPLANT
DRSG PREVENA PLUS CUSTOM (GAUZE/BANDAGES/DRESSINGS) ×1
DURAPREP 26ML APPLICATOR (WOUND CARE) ×1 IMPLANT
ELECT REM PT RETURN 9FT ADLT (ELECTROSURGICAL) ×1
ELECTRODE REM PT RTRN 9FT ADLT (ELECTROSURGICAL) ×1 IMPLANT
GLOVE BIOGEL PI IND STRL 9 (GLOVE) ×1 IMPLANT
GLOVE SURG ORTHO 9.0 STRL STRW (GLOVE) ×1 IMPLANT
GOWN STRL REUS W/ TWL XL LVL3 (GOWN DISPOSABLE) ×2 IMPLANT
GOWN STRL REUS W/TWL XL LVL3 (GOWN DISPOSABLE) ×2
GRAFT SKIN WND MICRO 38 (Tissue) IMPLANT
GRAFT SKIN WND OMEGA3 SB 7X10 (Tissue) IMPLANT
KIT BASIN OR (CUSTOM PROCEDURE TRAY) ×1 IMPLANT
KIT TURNOVER KIT B (KITS) ×1 IMPLANT
MANIFOLD NEPTUNE II (INSTRUMENTS) ×1 IMPLANT
NS IRRIG 1000ML POUR BTL (IV SOLUTION) ×1 IMPLANT
PACK ORTHO EXTREMITY (CUSTOM PROCEDURE TRAY) ×1 IMPLANT
PAD ARMBOARD 7.5X6 YLW CONV (MISCELLANEOUS) ×1 IMPLANT
PREVENA RESTOR ARTHOFORM 46X30 (CANNISTER) ×1 IMPLANT
SPONGE T-LAP 18X18 ~~LOC~~+RFID (SPONGE) IMPLANT
STAPLER VISISTAT 35W (STAPLE) IMPLANT
STOCKINETTE IMPERVIOUS LG (DRAPES) ×1 IMPLANT
SUT ETHILON 2 0 PSLX (SUTURE) IMPLANT
SUT SILK 2 0 (SUTURE) ×1
SUT SILK 2-0 18XBRD TIE 12 (SUTURE) ×1 IMPLANT
SUT VIC AB 1 CTX 27 (SUTURE) ×2 IMPLANT
TOWEL GREEN STERILE (TOWEL DISPOSABLE) ×1 IMPLANT
TUBE CONNECTING 12X1/4 (SUCTIONS) ×1 IMPLANT
YANKAUER SUCT BULB TIP NO VENT (SUCTIONS) ×1 IMPLANT

## 2022-06-15 NOTE — Transfer of Care (Signed)
Immediate Anesthesia Transfer of Care Note  Patient: VALERIA KRISKO  Procedure(s) Performed: LEFT BELOW KNEE AMPUTATION (Left: Knee)  Patient Location: PACU  Anesthesia Type:MAC combined with regional for post-op pain  Level of Consciousness: drowsy  Airway & Oxygen Therapy: Patient Spontanous Breathing and Patient connected to face mask oxygen  Post-op Assessment: Report given to RN, Post -op Vital signs reviewed and stable, and Patient moving all extremities X 4  Post vital signs: Reviewed and stable  Last Vitals:  Vitals Value Taken Time  BP 93/63 06/15/22 1436  Temp    Pulse 76 06/15/22 1439  Resp 14 06/15/22 1439  SpO2 97 % 06/15/22 1439  Vitals shown include unvalidated device data.  Last Pain:  Vitals:   06/15/22 1311  TempSrc:   PainSc: 0-No pain         Complications: No notable events documented.

## 2022-06-15 NOTE — Anesthesia Preprocedure Evaluation (Addendum)
Anesthesia Evaluation  Patient identified by MRN, date of birth, ID band Patient awake    Reviewed: Allergy & Precautions, NPO status , Patient's Chart, lab work & pertinent test results  History of Anesthesia Complications Negative for: history of anesthetic complications  Airway Mallampati: II  TM Distance: >3 FB Neck ROM: Full    Dental  (+) Dental Advisory Given,    Pulmonary sleep apnea , pneumonia   Pulmonary exam normal breath sounds clear to auscultation       Cardiovascular hypertension, Pt. on medications and Pt. on home beta blockers + CAD, + Past MI (2019) and + CABG (2019)   Rhythm:Regular Rate:Normal  TTE 2019: EF 50-55%, mild hypokinesis of apical myocardium, valves ok   Neuro/Psych negative neurological ROS  negative psych ROS   GI/Hepatic Neg liver ROS,GERD  Controlled,,  Endo/Other  diabetes, Type 2, Oral Hypoglycemic Agents    Renal/GU negative Renal ROS     Musculoskeletal  (+) Arthritis ,    Abdominal   Peds  Hematology  (+) Blood dyscrasia, anemia   Anesthesia Other Findings Left Foot Infection  Reproductive/Obstetrics                              Anesthesia Physical Anesthesia Plan  ASA: 3  Anesthesia Plan: Regional   Post-op Pain Management: Tylenol PO (pre-op)* and Regional block*   Induction: Intravenous  PONV Risk Score and Plan: 2 and Ondansetron, Treatment may vary due to age or medical condition, Midazolam, Propofol infusion, Dexamethasone and TIVA  Airway Management Planned: Natural Airway  Additional Equipment: None  Intra-op Plan:   Post-operative Plan:   Informed Consent: I have reviewed the patients History and Physical, chart, labs and discussed the procedure including the risks, benefits and alternatives for the proposed anesthesia with the patient or authorized representative who has indicated his/her understanding and acceptance.      Dental advisory given  Plan Discussed with: CRNA  Anesthesia Plan Comments: (PAT note by Karoline Caldwell, PA-C:  Follows with cardiology for hx of CAD s/p CABG x4 2019 (LIMA-LAD, SVG-[name], SVG-OM2, SVG-DIAG), HTN, OSA.  Patient has had multiple recent surgeries related to nonhealing foot ulcer.  He was cleared by cardiology prior to undergoing procedure in June 2023.  Per note by Ambrose Pancoast, NP on 12/29/2021, "Chart reviewed as part of pre-operative protocol coverage. Given past medical history and time since last visit, based on ACC/AHA guidelines, [place] would be at acceptable risk for the planned procedure without further cardiovascular testing."  Patient will need day of surgery labs and evaluation.  EKG 12/06/21: NSR. Rate 91. LAD. Possible lateral infarct, age undetermined.   TEE (inta-op CABG) 06/25/18:  Septum: No Patent Foramen Ovale present.   Left atrium: Patent foramen ovale not present.   Aortic valve: The valve is trileaflet. Mild valve calcification present. No stenosis. Trace regurgitation. No AV vegetation.   Mitral valve: No leaflet thickening and calcification present. Trace regurgitation.   Right ventricle: Normal cavity size, wall thickness and ejection fraction. No thrombus present. No mass present.  Left ventricle: Normal cavity size, wall thickness. LV systolic function is low normal with an EF 50-55%. Wall motion is abnormal. Inferolateral wall motion in hypokinetic. No thrombus present. No mass present.   Echo 06/22/18: Study Conclusions  - Left ventricle: The cavity size was normal. Wall thickness was    normal. Systolic function was normal. The estimated ejection    fraction  was in the range of 50% to 55%. Mild hypokinesis of the    apical myocardium. No thrombus seen on contrast study. Left    ventricular diastolic function parameters were normal.  - Aortic valve: Mildly calcified annulus. Mildly thickened    leaflets. No significant stenosis  or regurgitation.  - Mitral valve: Mildly calcified annulus. No significant stenosis    or regurgitation.    Cardiac cath 06/22/18 (PRE-CABG:  Ost 1st Diag to 1st Diag lesion is 70% stenosed.  1st Diag lesion is 80% stenosed. LM: Normal LAD: Prox ectatic areas with 40% stenoses          Mid to distal LAD is likely the culprit vessel. Mid 100% occlusion, with recanalization.Distal apical LAD is completely occluded and receives faint collaterals from septal perforators, and distal RPDA. TIMI 1 flow in distal LAD, TIMI 0 flow in distal apical LAD          Medium sized Diag branch with mid 70% and distal 80% stenoses.  LCx: Large OM 1 with proxiaml 50% and mid to distal 80% stenosis         Medium sized OM2 completely occluded with collaterals from OM1.  RCA: Dominant. Prox 40%, mid 80%, and ostial RPDA 95% stenoses. RPDA gives faint collateral to apical distal  LVEF 50-55% with apical hypokinesis.  Recommendation: CT surgery consult (S/p CABG 06/25/18)   Carotid US 06/23/18: Summary:  - Right Carotid: Velocities in the right ICA are consistent with a 1-39%  stenosis.  - Left Carotid: Velocities in the left ICA are consistent with a 1-39%  stenosis.  - Vertebrals: Bilateral vertebral arteries demonstrate antegrade flow.   )         Anesthesia Quick Evaluation

## 2022-06-15 NOTE — Op Note (Signed)
06/15/2022  2:42 PM  PATIENT:  Joshua Branch    PRE-OPERATIVE DIAGNOSIS:  Left Foot Dehiscence  POST-OPERATIVE DIAGNOSIS:  Same  PROCEDURE:  LEFT BELOW KNEE AMPUTATION Application of Kerecis micro graft 38 cm and Kerecis sheet 7 x 10 cm.  With autograft bone graft Application of Prevena customizable and Prevena arthroform wound VAC dressings Application of Vive Wear stump shrinker and the [employer] limb protector  SURGEON:  Newt Minion, MD  ANESTHESIA:   General  PREOPERATIVE INDICATIONS:  DRAE MITZEL is a  61 y.o. male with a diagnosis of Left Foot Dehiscence who failed conservative measures and elected for surgical management.    The risks benefits and alternatives were discussed with the patient preoperatively including but not limited to the risks of infection, bleeding, nerve injury, cardiopulmonary complications, the need for revision surgery, among others, and the patient was willing to proceed.  OPERATIVE IMPLANTS: Kerecis micro graft 38 cm and Kerecis sheet 7 x 10 cm.   OPERATIVE FINDINGS: Ischemic muscle changes good petechial bleeding  OPERATIVE PROCEDURE: Patient was brought to the operating room after undergoing a regional anesthetic.  After adequate levels anesthesia were obtained a thigh tourniquet was placed and the lower extremity was prepped using DuraPrep draped into a sterile field. The foot was draped out of the sterile field with impervious stockinette.  A timeout was called and the tourniquet inflated.  A transverse skin incision was made 12 cm distal to the tibial tubercle, the incision curved proximally, and a large posterior flap was created.  The tibia was transected just proximal to the skin incision and beveled anteriorly.  The fibula was transected just proximal to the tibial incision.  The sciatic nerve was pulled cut and allowed to retract.  The vascular bundles were suture ligated with 2-0 silk.  The tourniquet was deflated and hemostasis  obtained.    Drill holes were placed through the tibia and fibula to secure the [facility] tissue graft and the gastrocnemius fascia.  The Kerecis tissue graft was filled with autograft obtained from the resected tibia.  The Kerecis micro powder 38 cm was applied to the open wound that has a 200 cm surface area.  The 7 x 10 cm Kerecis sheet was then folded and secured to the distal tibia and fibula with #1 Vicryl.  A separate drill hole was then used to secure the [facility] tissue graft and the gastrocnemius fascia to the dorsum of the tibia.    The deep and superficial fascial layers were closed using #1 Vicryl.  The skin was closed using staples.    The Prevena customizable dressing was applied this was overwrapped with the arthroform sponge.  Charlie Pitter was used to secure the sponges and the circumferential compression was secured to the skin with Dermatac.  This was connected to the wound VAC pump and had a good suction fit this was covered with a stump shrinker and a limb protector.  Patient was taken to the PACU in stable condition.   DISCHARGE PLANNING:  Antibiotic duration: 24-hour antibiotics  Weightbearing: Nonweightbearing on the operative extremity  Pain medication: Opioid pathway  Dressing care/ Wound VAC: Continue wound VAC with the Prevena plus pump at discharge for 1 week  Ambulatory devices: Walker or kneeling scooter  Discharge to: Discharge planning based on recommendations per physical therapy  Follow-up: In the office 1 week after discharge.

## 2022-06-15 NOTE — H&P (Signed)
Joshua Branch is an 61 y.o. male.   Chief Complaint: Dehiscence left foot show part amputation. HPI: Patient is a 61 year old gentleman with diabetic insensate neuropathy peripheral vascular disease status post foot salvage intervention with dehiscence of a transmetatarsal amputation and a dehiscence of a show part amputation.  Patient has had palpable pulses at the ankle with biphasic flow and despite foot salvage intervention has not had sufficient microcirculation for wound healing.  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 ([place]) 01/18/2021   Hyperlipidemia    Hypertension    Neuropathy of left foot    Nodular basal cell carcinoma ([facility]) 07/10/2019   Right V of Neck (curet and 5FU)   Osteomyelitis of third toe of left foot ([name]    Pneumonia    As a child   Right foot ulcer ([place])    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   LIMA to LAD, SVG to Diag, SVG to OM2, SVG to [name], EVH via right thigh and leg   Subacute osteomyelitis, left ankle and foot ([place]) 05/25/2022   Type II diabetes mellitus with complication, uncontrolled    Uncontrolled type 2 diabetes mellitus     Past Surgical History:  Procedure Laterality Date   AMPUTATION Right 01/06/2020   Procedure: RIGHT FOOT 2ND RAY AMPUTATION;  Surgeon: Newt Minion, MD;  Location: [place];  Service: Orthopedics;  Laterality: Right;   AMPUTATION Left 01/11/2021   Procedure: LEFT FOOT 3RD RAY AMPUTATION;  Surgeon: Newt Minion, MD;  Location: [place];  Service: Orthopedics;  Laterality: Left;   AMPUTATION Left 12/22/2021   Procedure: LEFT  TRANSMETATARSAL AMPUTATION;  Surgeon: Newt Minion, MD;  Location: [place];  Service: Orthopedics;  Laterality: Left;   AMPUTATION Left 05/25/2022   Procedure: LEFT FOOT CHOPART;  Surgeon: Newt Minion, MD;  Location: [place];  Service: Orthopedics;  Laterality: Left;   APPLICATION OF WOUND VAC  12/22/2021   Procedure: APPLICATION OF WOUND VAC;  Surgeon: Newt Minion, MD;  Location: [place];  Service: Orthopedics;;   APPLICATION OF WOUND VAC Left 05/25/2022   Procedure: APPLICATION OF WOUND VAC;  Surgeon: Newt Minion, MD;  Location: [place];  Service: Orthopedics;  Laterality: Left;   COLONOSCOPY W/ POLYPECTOMY     CORONARY ARTERY BYPASS GRAFT N/A 06/25/2018   Procedure: CORONARY ARTERY BYPASS GRAFTING (CABG) TIMES FOUR USING LEFT INTERNAL MAMMARY ARTERY AND RIGHT ENDOSCOPICALLY HARVESTED SAPHENOUS VEIN;  Surgeon: Rexene Alberts, MD;  Location: [place];  Service: Open Heart Surgery;  Laterality: N/A;   CYSTOSCOPY WITH STENT PLACEMENT Left 12/04/2021   Procedure: CYSTOSCOPY/RETROGRADE/ STENT PLACEMENT;  Surgeon: Vira Agar, MD;  Location: [facility];  Service: Urology;  Laterality: Left;   CYSTOSCOPY/URETEROSCOPY/HOLMIUM LASER/STENT PLACEMENT Left 01/05/2022   Procedure: CYSTOSCOPY LEFT URETERAL STENT REMOVAL  LEFT RETROGRADE PYELOGRAM URETEROSCOPY BASKET STONE EXTRACTION STENT PLACEMENT;  Surgeon: Lucas Mallow, MD;  Location: [facility];  Service: Urology;  Laterality: Left;  1 HR FOR CASE   ENDOVEIN HARVEST OF GREATER SAPHENOUS  VEIN Right 06/25/2018   Procedure: ENDOVEIN HARVEST OF GREATER SAPHENOUS VEIN;  Surgeon: Rexene Alberts, MD;  Location: [place];  Service: Open Heart Surgery;  Laterality: Right;   EXTRACORPOREAL SHOCK WAVE LITHOTRIPSY Left 12/07/2021   Procedure: EXTRACORPOREAL SHOCK WAVE LITHOTRIPSY (ESWL);  Surgeon: Festus Aloe, MD;  Location: [facility];  Service: Urology;  Laterality: Left;   LEFT HEART CATH AND CORONARY ANGIOGRAPHY N/A 06/22/2018   Procedure:  LEFT HEART CATH AND CORONARY ANGIOGRAPHY;  Surgeon: Nigel Mormon, MD;  Location: [place] [facility];  Service: Cardiovascular;  Laterality: N/A;   SKIN SPLIT GRAFT Left 02/28/2022   Procedure: LEFT FOOT SKIN GRAFT;  Surgeon: Newt Minion, MD;  Location: [place];  Service: Orthopedics;  Laterality: Left;   surgical debridement  Right    Right foot ulcer   TEE WITHOUT CARDIOVERSION N/A 06/25/2018   Procedure: TRANSESOPHAGEAL ECHOCARDIOGRAM (TEE);  Surgeon: Rexene Alberts, MD;  Location: [place];  Service: Open Heart Surgery;  Laterality: N/A;    Family History  Problem Relation Age of Onset   Diabetes Mother    Stroke Father    Hypertension Father    Hearing loss Father    Colon polyps Father    Colon cancer Neg Hx    Esophageal cancer Neg Hx    Prostate cancer Neg Hx    Social History:  reports that he has never smoked. He has never used smokeless tobacco. He reports current alcohol use. He reports that he does not use drugs.  Allergies:  Allergies  Allergen Reactions   Apricot Kernel Oil [Prunus] Swelling    THROAT   Cherry Swelling    THROAT   Jardiance [Empagliflozin] Diarrhea and Nausea And Vomiting   Other Other (See Comments)    Fruits with a pit. Throat swells up. Can have them if cooked    Peach [Prunus Persica] Swelling    THROAT   Plum Pulp Swelling    THROAT    No medications prior to admission.    No results found for this or any previous visit (from the past 48 hour(s)). No results found.  Review of Systems  All other systems reviewed and are negative.   There were no vitals taken for this visit. Physical Exam  Examination patient is alert oriented no adenopathy well-dressed normal affect normal respiratory effort.  Patient has a palpable dorsalis pedis pulse Doppler shows biphasic dorsalis pedis and posterior tibial pulse.  There is complete dehiscence of the Chopart amputation with exposed bone. Assessment/Plan Assessment: Dehiscence Chopart  amputation left foot.  Plan.  Due to failure of foot salvage intervention patient would like to proceed with transtibial amputation at this time.  Risk and benefits were discussed including risk of the wound not healing need for additional surgery.  Patient states she understands wished to proceed at this time.  Newt Minion, MD 06/15/2022, 7:03 AM

## 2022-06-15 NOTE — Anesthesia Procedure Notes (Signed)
Anesthesia Regional Block: Popliteal block   Pre-Anesthetic Checklist: , timeout performed,  Correct Patient, Correct Site, Correct Laterality,  Correct Procedure, Correct Position, site marked,  Risks and benefits discussed,  Surgical consent,  Pre-op evaluation,  At surgeon's request and post-op pain management  Laterality: Lower and Left  Prep: chloraprep       Needles:  Injection technique: Single-shot  Needle Type: Stimiplex     Needle Length: 10cm  Needle Gauge: 21     Additional Needles:   Procedures:,,,, ultrasound used (permanent image in chart),,   Motor weakness within 5 minutes.  Narrative:  Start time: 06/15/2022 1:16 PM End time: 06/15/2022 1:22 PM Injection made incrementally with aspirations every 5 mL.  Performed by: Personally  Anesthesiologist: Nolon Nations, MD  Additional Notes: Nerve located and needle positioned with direct ultrasound guidance. Good perineural spread. Patient tolerated well.

## 2022-06-15 NOTE — Anesthesia Procedure Notes (Signed)
Procedure Name: MAC Date/Time: 06/15/2022 1:50 PM  Performed by: Heide Scales, CRNAPre-anesthesia Checklist: Patient identified, Emergency Drugs available, Suction available and Patient being monitored Patient Re-evaluated:Patient Re-evaluated prior to induction Oxygen Delivery Method: Simple face mask Preoxygenation: Pre-oxygenation with 100% oxygen Induction Type: IV induction

## 2022-06-15 NOTE — Anesthesia Procedure Notes (Signed)
Anesthesia Regional Block: Adductor canal block   Pre-Anesthetic Checklist: , timeout performed,  Correct Patient, Correct Site, Correct Laterality,  Correct Procedure, Correct Position, site marked,  Risks and benefits discussed,  Surgical consent,  Pre-op evaluation,  At surgeon's request and post-op pain management  Laterality: Lower and Left  Prep: chloraprep       Needles:  Injection technique: Single-shot  Needle Type: Stimiplex     Needle Length: 9cm  Needle Gauge: 21     Additional Needles:   Procedures:,,,, ultrasound used (permanent image in chart),,    Narrative:  Start time: 06/15/2022 1:02 PM End time: 06/15/2022 1:16 PM Injection made incrementally with aspirations every 5 mL.  Performed by: Personally  Anesthesiologist: Nolon Nations, MD  Additional Notes: BP cuff, EKG monitors applied. Sedation begun. Artery and nerve location verified with ultrasound. Anesthetic injected incrementally (8m), slowly, and after negative aspirations under direct u/s guidance. Good fascial/perineural spread. Tolerated well.

## 2022-06-15 NOTE — Interval H&P Note (Signed)
History and Physical Interval Note:  06/15/2022 2:28 PM  Joshua Branch  has presented today for surgery, with the diagnosis of Left Foot Dehiscence.  The various methods of treatment have been discussed with the patient and family. After consideration of risks, benefits and other options for treatment, the patient has consented to  Procedure(s): LEFT BELOW KNEE AMPUTATION (Left) as a surgical intervention.  [name] history has been reviewed, patient examined, no change in status, stable for surgery.  I have reviewed the patient's chart and labs.  Questions were answered to the patient's satisfaction.     Newt Minion

## 2022-06-16 LAB — GLUCOSE, CAPILLARY
Glucose-Capillary: 146 mg/dL — ABNORMAL HIGH (ref 70–99)
Glucose-Capillary: 131 mg/dL — ABNORMAL HIGH (ref 70–99)
Glucose-Capillary: 161 mg/dL — ABNORMAL HIGH (ref 70–99)
Glucose-Capillary: 114 mg/dL — ABNORMAL HIGH (ref 70–99)

## 2022-06-16 MED ORDER — INSULIN ASPART 100 UNIT/ML IJ SOLN
0.0000 [IU] | Freq: Three times a day (TID) | INTRAMUSCULAR | Status: DC
  Administered 2022-06-16: 3 [IU] via SUBCUTANEOUS
  Administered 2022-06-16: 2 [IU] via SUBCUTANEOUS
  Administered 2022-06-17: 3 [IU] via SUBCUTANEOUS
  Administered 2022-06-17 – 2022-06-18 (×2): 2 [IU] via SUBCUTANEOUS

## 2022-06-16 NOTE — Evaluation (Signed)
Physical Therapy Evaluation Patient Details Name: Joshua Branch MRN: [id number] DOB: 1961-06-15 Today's Date: 06/16/2022  History of Present Illness  Pt is a 61 y/o male s/p L BKA. PMH includes DM, HTN, CAD s/p CABG.  Clinical Impression  Pt admitted secondary to problem above with deficits below. Pt requiring min guard to min A for mobility with use of RW this session. Tolerated mobility well. Verbally reviewed amputation exercises and not to put pillow under the knee. Recommending HHPT at d/c to address mobility deficits. Will continue to follow acutely.        Recommendations for follow up therapy are one component of a multi-disciplinary discharge planning process, led by the attending physician.  Recommendations may be updated based on patient status, additional functional criteria and insurance authorization.  Follow Up Recommendations Home health PT      Assistance Recommended at Discharge Intermittent Supervision/Assistance  Patient can return home with the following  Help with stairs or ramp for entrance;Assist for transportation;Assistance with cooking/housework    Equipment Recommendations None recommended by PT  Recommendations for Other Services       Functional Status Assessment Patient has had a recent decline in their functional status and demonstrates the ability to make significant improvements in function in a reasonable and predictable amount of time.     Precautions / Restrictions Precautions Precautions: Fall Precaution Comments: L BKA Restrictions Weight Bearing Restrictions: Yes LLE Weight Bearing: Non weight bearing      Mobility  Bed Mobility Overal bed mobility: Modified Independent                  Transfers Overall transfer level: Needs assistance Equipment used: Rolling walker (2 wheels) Transfers: Sit to/from Stand Sit to Stand: Min guard           General transfer comment: Min guard for safety. Cues for hand placement.     Ambulation/Gait Ambulation/Gait assistance: Min assist, Min guard Gait Distance (Feet): 30 Feet Assistive device: Rolling walker (2 wheels) Gait Pattern/deviations: Step-to pattern Gait velocity: Decreased     General Gait Details: Hop to pattern. Decreased safety awareness with use of RW. Cues for proximity.  Stairs            Wheelchair Mobility    Modified Rankin (Stroke Patients Only)       Balance Overall balance assessment: Needs assistance Sitting-balance support: No upper extremity supported, Feet supported Sitting balance-Leahy Scale: Normal     Standing balance support: Bilateral upper extremity supported Standing balance-Leahy Scale: Poor Standing balance comment: Reliant on UE support                             Pertinent Vitals/Pain Pain Assessment Pain Assessment: Faces Faces Pain Scale: Hurts even more Pain Location: L residual limb Pain Descriptors / Indicators: Grimacing, Guarding Pain Intervention(s): Limited activity within patient's tolerance, Monitored during session, Repositioned    Home Living Family/patient expects to be discharged to:: Private residence Living Arrangements: Alone Available Help at Discharge: Family;Friend(s) Type of Home: House Home Access: Stairs to enter Entrance Stairs-Rails: None Entrance Stairs-Number of Steps: 2   Home Layout: Two level;Able to live on main level with bedroom/bathroom Home Equipment: Crutches;Other (comment) (knee scooter)      Prior Function Prior Level of Function : Independent/Modified Independent                     Hand Dominance  Extremity/Trunk Assessment   Upper Extremity Assessment Upper Extremity Assessment: Defer to OT evaluation    Lower Extremity Assessment Lower Extremity Assessment: LLE deficits/detail LLE Deficits / Details: Deficits consistent with post op pain and weakness.    Cervical / Trunk Assessment Cervical / Trunk  Assessment: Normal  Communication   Communication: No difficulties  Cognition Arousal/Alertness: Awake/alert Behavior During Therapy: WFL for tasks assessed/performed Overall Cognitive Status: Within Functional Limits for tasks assessed                                          General Comments      Exercises     Assessment/Plan    PT Assessment Patient needs continued PT services  PT Problem List Decreased strength;Decreased activity tolerance;Decreased balance;Decreased mobility;Decreased knowledge of use of DME;Decreased safety awareness;Decreased knowledge of precautions       PT Treatment Interventions DME instruction;Gait training;Stair training;Functional mobility training;Therapeutic activities;Therapeutic exercise;Balance training;Patient/family education    PT Goals (Current goals can be found in the Care Plan section)  Acute Rehab PT Goals Patient Stated Goal: to go home PT Goal Formulation: With patient Time For Goal Achievement: 06/30/22 Potential to Achieve Goals: Good    Frequency Min 3X/week     Co-evaluation               AM-PAC PT "6 Clicks" Mobility  Outcome Measure Help needed turning from your back to your side while in a flat bed without using bedrails?: None Help needed moving from lying on your back to sitting on the side of a flat bed without using bedrails?: None Help needed moving to and from a bed to a chair (including a wheelchair)?: A Little Help needed standing up from a chair using your arms (e.g., wheelchair or bedside chair)?: A Little Help needed to walk in hospital room?: A Little Help needed climbing 3-5 steps with a railing? : A Little 6 Click Score: 20    End of Session Equipment Utilized During Treatment: Gait belt Activity Tolerance: Patient tolerated treatment well Patient left: in chair;with call bell/phone within reach;with family/visitor present Nurse Communication: Mobility status PT Visit  Diagnosis: Unsteadiness on feet (R26.81);Muscle weakness (generalized) (M62.81)    Time: 3500-9381 PT Time Calculation (min) (ACUTE ONLY): 17 min   Charges:   PT Evaluation $PT Eval Low Complexity: 1 Low          Joshua Branch, PT, DPT  Acute Rehabilitation Services  Office: [phone]   Joshua Branch 06/16/2022, 1:05 PM

## 2022-06-16 NOTE — TOC Initial Note (Signed)
Transition of Care [facility]) - Initial/Assessment Note    Patient Details  Name: Joshua Branch MRN: [id number] Date of Birth: [birth year]  Transition of [facility]) CM/SW Contact:    Bartholomew Crews, RN Phone Number: [phone] 06/16/2022, 4:54 PM  Clinical Narrative:                  Spoke with patient on hospital room phone to discuss post acute transition. PTA home alone. Has a RW at home. Agrees with recommendation for tub bench and will purchase this on his own since it is an out of pocket expense. Agreeable to home health PT. Referral accepted by [facility]. Patient will need HH PT Face to Face order. Patient stated that he will have transportation home at discharge. TOC to follow for transition needs.   Expected Discharge Plan: [place] Barriers to Discharge: Continued Medical Work up   Patient Goals and CMS Choice Patient states their goals for this hospitalization and ongoing recovery are:: return home with family/friend support CMS Medicare.gov Compare Post Acute Care list provided to:: Patient Choice offered to / list presented to : Patient  Expected Discharge Plan and Services Expected Discharge Plan: [place] In-house Referral: NA Discharge Planning Services: CM Consult Post Acute Care Choice: [place] arrangements for the past 2 months: Single Family Home                 DME Arranged: N/A DME Agency: NA       HH Arranged: PT HH Agency: [place] Date [place]: 06/16/22 Time HH Agency Contacted: 2 Representative spoke with at [place]: Tommi Rumps  Prior Living Arrangements/Services Living arrangements for the past 2 months: [place] Lives with:: Self Patient language and need for interpreter reviewed:: Yes Do you feel safe going back to the place where you live?: Yes      Need for Family Participation in Patient Care: Yes (Comment) Care giver support system in place?: Yes  (comment) Current home services: DME (RW) Criminal Activity/Legal Involvement Pertinent to Current Situation/Hospitalization: No - Comment as needed  Activities of Daily Living Home Assistive Devices/Equipment: Other (Comment) (scooter) ADL Screening (condition at time of admission) Patient's cognitive ability adequate to safely complete daily activities?: Yes Is the patient deaf or have difficulty hearing?: Yes Does the patient have difficulty seeing, even when wearing glasses/contacts?: No Does the patient have difficulty concentrating, remembering, or making decisions?: No Patient able to express need for assistance with ADLs?: Yes Does the patient have difficulty dressing or bathing?: No Independently performs ADLs?: Yes (appropriate for developmental age) Does the patient have difficulty walking or climbing stairs?: No Weakness of Legs: None Weakness of Arms/Hands: None  Permission Sought/Granted                  Emotional Assessment Appearance:: Appears stated age Attitude/Demeanor/Rapport: Engaged Affect (typically observed): Accepting Orientation: : Oriented to Self, Oriented to Place, Oriented to  Time, Oriented to Situation Alcohol / Substance Use: Not Applicable Psych Involvement: No (comment)  Admission diagnosis:  S/P BKA (below knee amputation), left [facility]) [I33.825] Patient Active Problem List   Diagnosis Date Noted   S/P BKA (below knee amputation), left ([place]) 06/15/2022   Dehiscence of amputation stump of left lower extremity ([place]) 05/25/2022   Poorly controlled type 2 diabetes mellitus with circulatory disorder ([place]) 07/15/2018   Numbness in both hands 07/15/2018   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  Acute coronary syndrome ([place]) 06/22/2018   Coronary artery disease involving native coronary artery with unstable angina pectoris ([place]) 06/22/2018   Acute myocardial infarction ([place]) 06/22/2018   Hyperlipidemia    Active cochlear Meniere's disease of left ear  09/19/2016   Vertigo 08/20/2016   Myringotomy tube status 08/20/2016   Ear fullness, left 08/20/2016   Asymmetrical left sensorineural hearing loss 02/06/2016   Neuropathy of left foot 02/03/2015   Charcot's joint of left foot, non-diabetic 02/03/2015   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   Cramping of hands 06/02/2014   Eustachian tube dysfunction 06/02/2014   HTN (hypertension) 05/07/2014   Obstructive sleep apnea 05/07/2014   PCP:  Mackie Pai, PA-C Pharmacy:   [facility] [address], [place] [address] Phone: [phone] Fax: [phone]     Social Determinants of Health (SDOH) Interventions    Readmission Risk Interventions     No data to display

## 2022-06-16 NOTE — Plan of Care (Signed)
  Problem: Education: Goal: Knowledge of the prescribed therapeutic regimen will improve Outcome: Progressing Goal: Ability to verbalize activity precautions or restrictions will improve Outcome: Progressing Goal: Understanding of discharge needs will improve Outcome: Progressing   Problem: Activity: Goal: Ability to perform//tolerate increased activity and mobilize with assistive devices will improve Outcome: Progressing   Problem: Clinical Measurements: Goal: Postoperative complications will be avoided or minimized Outcome: Progressing   Problem: Self-Care: Goal: Ability to meet self-care needs will improve Outcome: Progressing   Problem: Self-Concept: Goal: Ability to maintain and perform role responsibilities to the fullest extent possible will improve Outcome: Progressing   Problem: Pain Management: Goal: Pain level will decrease with appropriate interventions Outcome: Progressing   Problem: Skin Integrity: Goal: Demonstration of wound healing without infection will improve Outcome: Progressing   Problem: Education: Goal: Knowledge of General Education information will improve Description: Including pain rating scale, medication(s)/side effects and non-pharmacologic comfort measures Outcome: Progressing   Problem: Health Behavior/Discharge Planning: Goal: Ability to manage health-related needs will improve Outcome: Progressing   Problem: Clinical Measurements: Goal: Ability to maintain clinical measurements within normal limits will improve Outcome: Progressing Goal: Will remain free from infection Outcome: Progressing Goal: Diagnostic test results will improve Outcome: Progressing Goal: Respiratory complications will improve Outcome: Progressing Goal: Cardiovascular complication will be avoided Outcome: Progressing   Problem: Activity: Goal: Risk for activity intolerance will decrease Outcome: Progressing   Problem: Nutrition: Goal: Adequate nutrition  will be maintained Outcome: Progressing   Problem: Coping: Goal: Level of anxiety will decrease Outcome: Progressing   Problem: Elimination: Goal: Will not experience complications related to bowel motility Outcome: Progressing Goal: Will not experience complications related to urinary retention Outcome: Progressing   Problem: Pain Managment: Goal: General experience of comfort will improve Outcome: Progressing   Problem: Safety: Goal: Ability to remain free from injury will improve Outcome: Progressing   Problem: Skin Integrity: Goal: Risk for impaired skin integrity will decrease Outcome: Progressing   Problem: Education: Goal: Ability to describe self-care measures that may prevent or decrease complications (Diabetes Survival Skills Education) will improve Outcome: Progressing Goal: Individualized Educational Video(s) Outcome: Progressing   Problem: Coping: Goal: Ability to adjust to condition or change in health will improve Outcome: Progressing   Problem: Fluid Volume: Goal: Ability to maintain a balanced intake and output will improve Outcome: Progressing   Problem: Health Behavior/Discharge Planning: Goal: Ability to identify and utilize available resources and services will improve Outcome: Progressing Goal: Ability to manage health-related needs will improve Outcome: Progressing   Problem: Metabolic: Goal: Ability to maintain appropriate glucose levels will improve Outcome: Progressing   Problem: Nutritional: Goal: Maintenance of adequate nutrition will improve Outcome: Progressing Goal: Progress toward achieving an optimal weight will improve Outcome: Progressing   Problem: Skin Integrity: Goal: Risk for impaired skin integrity will decrease Outcome: Progressing   Problem: Tissue Perfusion: Goal: Adequacy of tissue perfusion will improve Outcome: Progressing

## 2022-06-16 NOTE — Evaluation (Signed)
Occupational Therapy Evaluation Patient Details Name: Joshua Branch MRN: [id number] DOB: [birth year] Today's Date: 06/16/2022   History of Present Illness Pt is a 61 y/o male s/p L BKA. PMH includes DM, HTN, CAD s/p CABG.   Clinical Impression   Pt currently min guard assist for simulated selfcare tasks sit to stand, transfers, and grooming tasks in standing at the sink.  Feel he will benefit from acute care OT to help increase ADL independence for home as pt lives alone but will have assist from family and friends.  Will purchase tub bench outside of hospital which is the only recommended OT DME      Recommendations for follow up therapy are one component of a multi-disciplinary discharge planning process, led by the attending physician.  Recommendations may be updated based on patient status, additional functional criteria and insurance authorization.   Follow Up Recommendations  Other (comment) (Pt reports that he has family members that are OTs and he will get them to help if needed.  He just wants PT home health at this time.)     Assistance Recommended at Discharge PRN  Patient can return home with the following Assist for transportation;Help with stairs or ramp for entrance;Assistance with cooking/housework;A little help with bathing/dressing/bathroom    Functional Status Assessment  Patient has had a recent decline in their functional status and demonstrates the ability to make significant improvements in function in a reasonable and predictable amount of time.  Equipment Recommendations  Tub/shower bench;Other (comment) (pt will purchase outside of the hospital)    Recommendations for Other Services       Precautions / Restrictions Precautions Precautions: Fall Precaution Comments: L BKA Required Braces or Orthoses: Other Brace Other Brace: limb guard Restrictions Weight Bearing Restrictions: Yes LLE Weight Bearing: Non weight bearing Other Position/Activity  Restrictions: currently with wound vac      Mobility Bed Mobility Overal bed mobility: Modified Independent                  Transfers Overall transfer level: Needs assistance Equipment used: Rolling walker (2 wheels) Transfers: Sit to/from Stand, Bed to chair/wheelchair/BSC Sit to Stand: Min guard     Step pivot transfers: Min guard     General transfer comment: Min instructional cueing for technique during sit to stand regarding hand placement as at times, he wants to pull up on the RW.      Balance Overall balance assessment: Needs assistance   Sitting balance-Leahy Scale: Normal     Standing balance support: Bilateral upper extremity supported Standing balance-Leahy Scale: Poor Standing balance comment: Pt is reliant on UE support for balance.                           ADL either performed or assessed with clinical judgement   ADL Overall ADL's : Needs assistance/impaired Eating/Feeding: Independent;Sitting   Grooming: Oral care;Standing;Supervision/safety   Upper Body Bathing: Set up;Sitting Upper Body Bathing Details (indicate cue type and reason): simulated Lower Body Bathing: Supervison/ safety;Sit to/from stand   Upper Body Dressing : Set up;Sitting Upper Body Dressing Details (indicate cue type and reason): simulated Lower Body Dressing: Supervision/safety;Sit to/from stand Lower Body Dressing Details (indicate cue type and reason): simulated Toilet Transfer: Min guard;Rolling walker (2 wheels);Ambulation Toilet Transfer Details (indicate cue type and reason): simulated Toileting- Clothing Manipulation and Hygiene: Min guard;Sit to/from stand       Functional mobility during ADLs: Min guard;Rolling walker (2 wheels) (  ambulation) General ADL Comments: Practiced shower transfers with use of the tub bench for simulated walk-in shower.  Discussed need for a walker bag or tray for meal prep in the kitchen to help with being able to  transport items around based on kitchen setup.  Pt reports that he will get a tub bench outside of the hospital and has 3 family members that are OTs as well.  He politely declines HHOT and will get them to help if needed.  He feels the elevated toilet is high enough that he will not need a 3:1.     Vision Baseline Vision/History: 1 Wears glasses Ability to See in Adequate Light: 0 Adequate Patient Visual Report: No change from baseline Vision Assessment?: No apparent visual deficits            Pertinent Vitals/Pain Pain Assessment Pain Assessment: No/denies pain     Hand Dominance Right   Extremity/Trunk Assessment Upper Extremity Assessment Upper Extremity Assessment: Overall WFL for tasks assessed   Lower Extremity Assessment Lower Extremity Assessment: Defer to PT evaluation LLE Deficits / Details: Deficits consistent with post op pain and weakness.   Cervical / Trunk Assessment Cervical / Trunk Assessment: Normal   Communication Communication Communication: No difficulties   Cognition Arousal/Alertness: Awake/alert Behavior During Therapy: WFL for tasks assessed/performed Overall Cognitive Status: Within Functional Limits for tasks assessed                                                  Home Living Family/patient expects to be discharged to:: Private residence Living Arrangements: Alone Available Help at Discharge: Family;Friend(s) Type of Home: House Home Access: Stairs to enter [employer] of Steps: 2 Entrance Stairs-Rails: None Home Layout: Two level;Able to live on main level with bedroom/bathroom     Bathroom Shower/Tub: Occupational psychologist: Handicapped height Bathroom Accessibility: Yes   Home Equipment: Crutches;Other (comment) (knee scooter)          Prior Functioning/Environment Prior Level of Function : Independent/Modified Independent                        OT Problem List: Impaired  balance (sitting and/or standing);Decreased knowledge of use of DME or AE      OT Treatment/Interventions: Self-care/ADL training;Patient/family education;Balance training;Therapeutic activities;DME and/or AE instruction    OT Goals(Current goals can be found in the care plan section) Acute Rehab OT Goals Patient Stated Goal: Pt wants to get home as soon as possible. OT Goal Formulation: With patient Time For Goal Achievement: 06/30/22 Potential to Achieve Goals: Good  OT Frequency: Min 2X/week       AM-PAC OT "6 Clicks" Daily Activity     Outcome Measure Help from another person eating meals?: None Help from another person taking care of personal grooming?: A Little Help from another person toileting, which includes using toliet, bedpan, or urinal?: A Little Help from another person bathing (including washing, rinsing, drying)?: A Little Help from another person to put on and taking off regular upper body clothing?: None Help from another person to put on and taking off regular lower body clothing?: A Little 6 Click Score: 20   End of Session Equipment Utilized During Treatment: Rolling walker (2 wheels);Other (comment) (wound vac) Nurse Communication: Mobility status  Activity Tolerance: Patient tolerated treatment well Patient  left: in chair;with call bell/phone within reach  OT Visit Diagnosis: Unsteadiness on feet (R26.81)                Time: 8329-1916 OT Time Calculation (min): 52 min Charges:  OT General Charges $OT Visit: 1 Visit OT Evaluation $OT Eval Moderate Complexity: 1 Mod OT Treatments $Self Care/Home Management : 23-37 mins Hyder Deman OTR/L 06/16/2022, 2:41 PM

## 2022-06-16 NOTE — Progress Notes (Signed)
Inpatient Rehab Admissions Coordinator:  Consult received. Therapy recommending [facility] therapy. Pt does not appear to warrant the intensity of CIR. TOC made aware. AC will sign off.   Gayland Curry, [place], [place] Admissions Coordinator [phone]

## 2022-06-16 NOTE — Plan of Care (Signed)
  Problem: Education: Goal: Knowledge of the prescribed therapeutic regimen will improve Outcome: Progressing Goal: Ability to verbalize activity precautions or restrictions will improve Outcome: Progressing Goal: Understanding of discharge needs will improve Outcome: Progressing

## 2022-06-16 NOTE — Progress Notes (Signed)
Patient ID: Joshua Branch, male   DOB: Feb 21, 1961, 61 y.o.   MRN: [id number] Postoperative day 1 left transtibial amputation.  There is no drainage in the wound VAC canister.  Plan for physical therapy anticipate discharge to home on Monday.

## 2022-06-17 ENCOUNTER — Encounter (HOSPITAL_COMMUNITY): Payer: Self-pay | Admitting: Orthopedic Surgery

## 2022-06-17 LAB — GLUCOSE, CAPILLARY
Glucose-Capillary: 97 mg/dL (ref 70–99)
Glucose-Capillary: 143 mg/dL — ABNORMAL HIGH (ref 70–99)
Glucose-Capillary: 190 mg/dL — ABNORMAL HIGH (ref 70–99)
Glucose-Capillary: 161 mg/dL — ABNORMAL HIGH (ref 70–99)

## 2022-06-17 NOTE — Plan of Care (Signed)
  Problem: Education: Goal: Understanding of discharge needs will improve Outcome: Progressing   Problem: Activity: Goal: Ability to perform//tolerate increased activity and mobilize with assistive devices will improve Outcome: Progressing   Problem: Clinical Measurements: Goal: Postoperative complications will be avoided or minimized Outcome: Progressing

## 2022-06-17 NOTE — Progress Notes (Signed)
PT Cancellation Note  Patient Details Name: Joshua Branch MRN: [id number] DOB: 13-Apr-1961   Cancelled Treatment:    Reason Eval/Treat Not Completed: Pain limiting ability to participate;Fatigue/lethargy limiting ability to participate. Pt politely declining PT. Stating he did not sleep well last night due to pain, and increased pain has continued today. RN preparing to administer pain meds now. Pt currently sitting up in recliner, declining need for return to bed.   Lorriane Shire 06/17/2022, 2:46 PM

## 2022-06-17 NOTE — Anesthesia Postprocedure Evaluation (Signed)
Anesthesia Post Note  [name]  Procedure(s) Performed: LEFT BELOW KNEE AMPUTATION (Left: Knee)     Patient location during evaluation: PACU Anesthesia Type: Regional Level of consciousness: awake and alert Pain management: pain level controlled Vital Signs Assessment: post-procedure vital signs reviewed and stable Respiratory status: spontaneous breathing Cardiovascular status: stable Anesthetic complications: no   No notable events documented.  Last Vitals:  Vitals:   06/16/22 2006 06/17/22 0458  BP: 128/81 106/67  Pulse: 98 86  Resp: 18 18  Temp: 37.6 C   SpO2: 96% 99%    Last Pain:  Vitals:   06/17/22 0717  TempSrc:   PainSc: [place]

## 2022-06-17 NOTE — Plan of Care (Signed)
  Problem: Education: Goal: Knowledge of the prescribed therapeutic regimen will improve Outcome: Not Progressing Goal: Ability to verbalize activity precautions or restrictions will improve Outcome: Not Progressing Goal: Understanding of discharge needs will improve Outcome: Not Progressing   Problem: Activity: Goal: Ability to perform//tolerate increased activity and mobilize with assistive devices will improve Outcome: Not Progressing   Problem: Clinical Measurements: Goal: Postoperative complications will be avoided or minimized Outcome: Not Progressing   Problem: Self-Care: Goal: Ability to meet self-care needs will improve Outcome: Not Progressing   Problem: Self-Concept: Goal: Ability to maintain and perform role responsibilities to the fullest extent possible will improve Outcome: Not Progressing   Problem: Pain Management: Goal: Pain level will decrease with appropriate interventions Outcome: Not Progressing   Problem: Skin Integrity: Goal: Demonstration of wound healing without infection will improve Outcome: Not Progressing   Problem: Education: Goal: Knowledge of General Education information will improve Description: Including pain rating scale, medication(s)/side effects and non-pharmacologic comfort measures Outcome: Not Progressing   Problem: Health Behavior/Discharge Planning: Goal: Ability to manage health-related needs will improve Outcome: Not Progressing   Problem: Clinical Measurements: Goal: Ability to maintain clinical measurements within normal limits will improve Outcome: Not Progressing Goal: Will remain free from infection Outcome: Not Progressing Goal: Diagnostic test results will improve Outcome: Not Progressing Goal: Respiratory complications will improve Outcome: Not Progressing Goal: Cardiovascular complication will be avoided Outcome: Not Progressing   Problem: Activity: Goal: Risk for activity intolerance will decrease Outcome:  Not Progressing   Problem: Nutrition: Goal: Adequate nutrition will be maintained Outcome: Not Progressing   Problem: Coping: Goal: Level of anxiety will decrease Outcome: Not Progressing   Problem: Elimination: Goal: Will not experience complications related to bowel motility Outcome: Not Progressing Goal: Will not experience complications related to urinary retention Outcome: Not Progressing   Problem: Pain Managment: Goal: General experience of comfort will improve Outcome: Not Progressing   Problem: Safety: Goal: Ability to remain free from injury will improve Outcome: Not Progressing   Problem: Skin Integrity: Goal: Risk for impaired skin integrity will decrease Outcome: Not Progressing   Problem: Education: Goal: Ability to describe self-care measures that may prevent or decrease complications (Diabetes Survival Skills Education) will improve Outcome: Not Progressing Goal: Individualized Educational Video(s) Outcome: Not Progressing   Problem: Coping: Goal: Ability to adjust to condition or change in health will improve Outcome: Not Progressing   Problem: Fluid Volume: Goal: Ability to maintain a balanced intake and output will improve Outcome: Not Progressing   Problem: Health Behavior/Discharge Planning: Goal: Ability to identify and utilize available resources and services will improve Outcome: Not Progressing Goal: Ability to manage health-related needs will improve Outcome: Not Progressing   Problem: Metabolic: Goal: Ability to maintain appropriate glucose levels will improve Outcome: Not Progressing   Problem: Nutritional: Goal: Maintenance of adequate nutrition will improve Outcome: Not Progressing Goal: Progress toward achieving an optimal weight will improve Outcome: Not Progressing   Problem: Skin Integrity: Goal: Risk for impaired skin integrity will decrease Outcome: Not Progressing   Problem: Tissue Perfusion: Goal: Adequacy of tissue  perfusion will improve Outcome: Not Progressing

## 2022-06-18 ENCOUNTER — Encounter (HOSPITAL_COMMUNITY): Payer: Self-pay | Admitting: Orthopedic Surgery

## 2022-06-18 ENCOUNTER — Other Ambulatory Visit (HOSPITAL_BASED_OUTPATIENT_CLINIC_OR_DEPARTMENT_OTHER): Payer: Self-pay

## 2022-06-18 LAB — GLUCOSE, CAPILLARY
Glucose-Capillary: 64 mg/dL — ABNORMAL LOW (ref 70–99)
Glucose-Capillary: 89 mg/dL (ref 70–99)
Glucose-Capillary: 149 mg/dL — ABNORMAL HIGH (ref 70–99)

## 2022-06-18 MED ORDER — HYDROCODONE-ACETAMINOPHEN 5-325 MG PO TABS
1.0000 | ORAL_TABLET | ORAL | 0 refills | Status: DC | PRN
  Filled 2022-06-18: qty 30, 5d supply, fill #0

## 2022-06-18 NOTE — Progress Notes (Signed)
Occupational Therapy Treatment Patient Details Name: Joshua Branch MRN: [id number] DOB: Apr 24, 1961 Today's Date: 06/18/2022   History of present illness Pt is a 61 y/o male s/p L BKA. PMH includes DM, HTN, CAD s/p CABG.   OT comments  Patient received in supine and able to get to EOB without assistance and performed dressing while seated EOB. Patient performed mobility and transfer training with RW and min guard with verbal cues for safety. Patient was setup for breakfast and discussed shower bench vs shower chair with patient stating he will purchase a shower chair when he get home. Acute OT to continue to follow.    Recommendations for follow up therapy are one component of a multi-disciplinary discharge planning process, led by the attending physician.  Recommendations may be updated based on patient status, additional functional criteria and insurance authorization.    Follow Up Recommendations  Other (comment) (Pt reports that he has family members that are OTs and he will get them to help if needed. He just wants PT home health at this time.)     Assistance Recommended at Discharge PRN  Patient can return home with the following  Assist for transportation;Help with stairs or ramp for entrance;Assistance with cooking/housework;A little help with bathing/dressing/bathroom   Equipment Recommendations  Tub/shower seat;Other (comment) (Pt will purchase outside of the hospital)    Recommendations for Other Services      Precautions / Restrictions Precautions Precautions: Fall Precaution Comments: L BKA Required Braces or Orthoses: Other Brace Other Brace: limb guard Restrictions Weight Bearing Restrictions: Yes LLE Weight Bearing: Non weight bearing Other Position/Activity Restrictions: currently with wound vac       Mobility Bed Mobility Overal bed mobility: Modified Independent                  Transfers Overall transfer level: Needs assistance Equipment used:  Rolling walker (2 wheels) Transfers: Sit to/from Stand, Bed to chair/wheelchair/BSC Sit to Stand: Min guard     Step pivot transfers: Min guard     General transfer comment: min guard with cues for safety     Balance Overall balance assessment: Needs assistance Sitting-balance support: No upper extremity supported, Feet supported Sitting balance-Leahy Scale: Normal     Standing balance support: Bilateral upper extremity supported Standing balance-Leahy Scale: Poor Standing balance comment: Pt is reliant on UE support for balance.                           ADL either performed or assessed with clinical judgement   ADL Overall ADL's : Needs assistance/impaired         Upper Body Bathing: Set up;Sitting   Lower Body Bathing: Supervison/ safety;Sit to/from stand           Toilet Transfer: Min guard;Rolling walker (2 wheels);Ambulation Toilet Transfer Details (indicate cue type and reason): simulated           General ADL Comments: Discussed shower chair vs shower bench with examples of each    Extremity/Trunk Assessment              Vision       Perception     Praxis      Cognition Arousal/Alertness: Awake/alert Behavior During Therapy: WFL for tasks assessed/performed Overall Cognitive Status: Within Functional Limits for tasks assessed  Exercises      Shoulder Instructions       General Comments      Pertinent Vitals/ Pain       Pain Assessment Pain Assessment: Faces Faces Pain Scale: Hurts little more Pain Location: L residual limb Pain Descriptors / Indicators: Grimacing, Guarding Pain Intervention(s): Limited activity within patient's tolerance, Monitored during session, Repositioned  Home Living                                          Prior Functioning/Environment              Frequency  Min 2X/week        Progress Toward  Goals  OT Goals(current goals can now be found in the care plan section)  Progress towards OT goals: Progressing toward goals  Acute Rehab OT Goals Patient Stated Goal: go home OT Goal Formulation: With patient Time For Goal Achievement: 06/30/22 Potential to Achieve Goals: Good ADL Goals Pt Will Perform Grooming: with modified independence;standing Pt Will Perform Lower Body Bathing: with supervision;sit to/from stand Pt Will Perform Lower Body Dressing: with modified independence;sit to/from stand Pt Will Transfer to Toilet: with modified independence;ambulating Pt Will Perform Toileting - Clothing Manipulation and hygiene: with modified independence;sit to/from stand Pt Will Perform Tub/Shower Transfer: with supervision;Tub transfer;ambulating;tub bench;rolling walker  Plan Discharge plan remains appropriate    Co-evaluation                 AM-PAC OT "6 Clicks" Daily Activity     Outcome Measure   Help from another person eating meals?: None Help from another person taking care of personal grooming?: A Little Help from another person toileting, which includes using toliet, bedpan, or urinal?: A Little Help from another person bathing (including washing, rinsing, drying)?: A Little Help from another person to put on and taking off regular upper body clothing?: None Help from another person to put on and taking off regular lower body clothing?: A Little 6 Click Score: 20    End of Session Equipment Utilized During Treatment: Rolling walker (2 wheels);Other (comment) (wound vac)  OT Visit Diagnosis: Unsteadiness on feet (R26.81)   Activity Tolerance Patient tolerated treatment well   Patient Left in chair;with call bell/phone within reach   Nurse Communication Mobility status        Time: 8003-4917 OT Time Calculation (min): 17 min  Charges: OT General Charges $OT Visit: 1 Visit OT Treatments $Self Care/Home Management : 8-22 mins  Lodema Hong, [place]  Office [place] 06/18/2022, 11:18 AM

## 2022-06-18 NOTE — Discharge Summary (Signed)
Discharge Diagnoses:  Principal Problem:   S/P BKA (below knee amputation), left ([place])   Surgeries: Procedure(s): LEFT BELOW KNEE AMPUTATION on 06/15/2022    Consultants:   Discharged Condition: Improved  Hospital Course: Joshua Branch is an 61 y.o. male who was admitted 06/15/2022 with a chief complaint of osteomyelitis left foot, with a final diagnosis of Left Foot Dehiscence.  Patient was brought to the operating room on 06/15/2022 and underwent Procedure(s): LEFT BELOW KNEE AMPUTATION.    Patient was given perioperative antibiotics:  Anti-infectives (From admission, onward)    Start     Dose/Rate Route Frequency Ordered Stop   06/15/22 2200  ceFAZolin (ANCEF) IVPB 2g/100 mL premix        2 g 200 mL/hr over 30 Minutes Intravenous Every 8 hours 06/15/22 1751 06/16/22 0634   06/15/22 1300  ceFAZolin (ANCEF) IVPB 2g/100 mL premix        2 g 200 mL/hr over 30 Minutes Intravenous On call to O.R. 06/15/22 1242 06/15/22 1400     .  Patient was given sequential compression devices, early ambulation, and aspirin for DVT prophylaxis.  Recent vital signs: Patient Vitals for the past 24 hrs:  BP Temp Temp src Pulse Resp SpO2  06/18/22 0833 102/68 97.6 F (36.4 C) Oral 86 -- 94 %  06/18/22 0420 110/77 97.6 F (36.4 C) Oral 87 16 98 %  06/17/22 1925 110/63 98.6 F (37 C) Oral 88 18 98 %  06/17/22 1338 119/80 97.8 F (36.6 C) Oral 88 18 98 %  .  Recent laboratory studies: No results found.  Discharge Medications:   Allergies as of 06/18/2022       Reactions   Apricot Kernel Oil [prunus] Swelling   THROAT   Cherry Swelling   THROAT   Jardiance [empagliflozin] Diarrhea, Nausea And Vomiting   Other Other (See Comments)   Fruits with a pit. Throat swells up. Can have them if cooked    Peach [prunus Persica] Swelling   THROAT   Plum Pulp Swelling   THROAT        Medication List     STOP taking these medications    nitroGLYCERIN 0.2 mg/hr patch Commonly known  as: NITRODUR - Dosed in mg/24 hr   pentoxifylline 400 MG CR tablet Commonly known as: TRENTAL   SSD 1 % cream Generic drug: silver sulfADIAZINE   sulfamethoxazole-trimethoprim 800-160 MG tablet Commonly known as: BACTRIM DS       TAKE these medications    ALPRAZolam 0.5 MG tablet Commonly known as: Xanax Take 1 tablet (0.5 mg total) by mouth 3 (three) times daily as needed for anxiety.   aspirin EC 81 MG tablet Take 1 tablet (81 mg total) by mouth daily. Swallow whole.   atorvastatin 80 MG tablet Commonly known as: LIPITOR Take 1 tablet (80 mg total) by mouth daily. What changed: when to take this   FeroSul 325 (65 FE) MG tablet Generic drug: ferrous sulfate Take 1 tablet by mouth everyday   FREESTYLE LITE test strip Generic drug: glucose blood USE AS INSTRUCTED TO CHECK 1-2X A DAY   gabapentin 300 MG capsule Commonly known as: NEURONTIN Take 1 capsule (300 mg total) by mouth at bedtime. up to 3 times a day when necessary neuropathy pain   glipiZIDE 5 MG 24 hr tablet Commonly known as: GLUCOTROL XL Take 1 tablet (5 mg total) by mouth daily before breakfast.   hydrochlorothiazide 12.5 MG capsule Commonly known as: MICROZIDE TAKE 1 CAPSULE (  12.5 MG TOTAL) BY MOUTH DAILY.   HYDROcodone-acetaminophen 5-325 MG tablet Commonly known as: NORCO/VICODIN Take 1 tablet by mouth every 4 (four) hours as needed.   Lancets Misc Check sugars 3 times a day E11.9   lisinopril 5 MG tablet Commonly known as: ZESTRIL Take 1 tablet (5 mg total) by mouth daily.   metFORMIN 500 MG tablet Commonly known as: GLUCOPHAGE Take 3 tablets by mouth daily as advised What changed:  how much to take how to take this when to take this additional instructions   metoprolol tartrate 25 MG tablet Commonly known as: LOPRESSOR TAKE 1 TABLET (25 MG TOTAL) BY MOUTH 2 (TWO) TIMES DAILY.   multivitamin with minerals Tabs tablet Take 1 tablet by mouth every evening.   Shingrix  injection Generic drug: Zoster Vaccine Adjuvanted Inject into the muscle.   sildenafil 50 MG tablet Commonly known as: Viagra Take 1 tablet (50 mg total) by mouth daily as needed for erectile dysfunction.   Testosterone 30 MG/ACT Soln Apply 2 pumps topically as directed daily               Discharge Care Instructions  (From admission, onward)           Start     Ordered   06/18/22 0000  Non weight bearing       Question Answer Comment  Laterality left   Extremity Lower      06/18/22 0819            Diagnostic Studies: No results found.  Patient benefited maximally from their hospital stay and there were no complications.     Disposition: Discharge disposition: 01-Home or Self Care      Discharge Instructions     Call MD / Call 911   Complete by: As directed    If you experience chest pain or shortness of breath, CALL 911 and be transported to the hospital emergency room.  If you develope a fever above 101 F, pus (white drainage) or increased drainage or redness at the wound, or calf pain, call your surgeon's office.   Call MD / Call 911   Complete by: As directed    If you experience chest pain or shortness of breath, CALL 911 and be transported to the hospital emergency room.  If you develope a fever above 101 F, pus (white drainage) or increased drainage or redness at the wound, or calf pain, call your surgeon's office.   Constipation Prevention   Complete by: As directed    Drink plenty of fluids.  Prune juice may be helpful.  You may use a stool softener, such as Colace (over the counter) 100 mg twice a day.  Use MiraLax (over the counter) for constipation as needed.   Constipation Prevention   Complete by: As directed    Drink plenty of fluids.  Prune juice may be helpful.  You may use a stool softener, such as Colace (over the counter) 100 mg twice a day.  Use MiraLax (over the counter) for constipation as needed.   Diet - low sodium heart  healthy   Complete by: As directed    Diet - low sodium heart healthy   Complete by: As directed    Increase activity slowly as tolerated   Complete by: As directed    Increase activity slowly as tolerated   Complete by: As directed    Negative Pressure Wound Therapy - Incisional   Complete by: As directed    Non  weight bearing   Complete by: As directed    Laterality: left   Extremity: Lower   Post-operative opioid taper instructions:   Complete by: As directed    POST-OPERATIVE OPIOID TAPER INSTRUCTIONS: It is important to wean off of your opioid medication as soon as possible. If you do not need pain medication after your surgery it is ok to stop day one. Opioids include: Codeine, Hydrocodone(Norco, Vicodin), Oxycodone(Percocet, oxycontin) and hydromorphone amongst others.  Long term and even short term use of opiods can cause: Increased pain response Dependence Constipation Depression Respiratory depression And more.  Withdrawal symptoms can include Flu like symptoms Nausea, vomiting And more Techniques to manage these symptoms Hydrate well Eat regular healthy meals Stay active Use relaxation techniques(deep breathing, meditating, yoga) Do Not substitute Alcohol to help with tapering If you have been on opioids for less than two weeks and do not have pain than it is ok to stop all together.  Plan to wean off of opioids This plan should start within one week post op of your joint replacement. Maintain the same interval or time between taking each dose and first decrease the dose.  Cut the total daily intake of opioids by one tablet each day Next start to increase the time between doses. The last dose that should be eliminated is the evening dose.      Post-operative opioid taper instructions:   Complete by: As directed    POST-OPERATIVE OPIOID TAPER INSTRUCTIONS: It is important to wean off of your opioid medication as soon as possible. If you do not need pain  medication after your surgery it is ok to stop day one. Opioids include: Codeine, Hydrocodone(Norco, Vicodin), Oxycodone(Percocet, oxycontin) and hydromorphone amongst others.  Long term and even short term use of opiods can cause: Increased pain response Dependence Constipation Depression Respiratory depression And more.  Withdrawal symptoms can include Flu like symptoms Nausea, vomiting And more Techniques to manage these symptoms Hydrate well Eat regular healthy meals Stay active Use relaxation techniques(deep breathing, meditating, yoga) Do Not substitute Alcohol to help with tapering If you have been on opioids for less than two weeks and do not have pain than it is ok to stop all together.  Plan to wean off of opioids This plan should start within one week post op of your joint replacement. Maintain the same interval or time between taking each dose and first decrease the dose.  Cut the total daily intake of opioids by one tablet each day Next start to increase the time between doses. The last dose that should be eliminated is the evening dose.          Follow-up Information     Newt Minion, MD Follow up in 1 week(s).   Specialty: Orthopedic Surgery Contact information: [place] [address] [phone]         Care, [facility] Follow up.   Specialty: [place] Why: some from the office at [facility] will call to schedule home health physical therapy visits Contact information: [place] [address] [phone]                  Signed: Newt Minion 06/18/2022, 8:35 AM

## 2022-06-18 NOTE — Progress Notes (Signed)
Patient ID: Joshua Branch, male   DOB: 03-Dec-1960, 61 y.o.   MRN: [id number] Patient without complaints this morning.  There is no drainage in the wound VAC canister.  Plan for discharge on the [name] plus portable wound VAC pump.  Patient has 2 steps to get into his house and will need therapy prior to discharge for stair training.

## 2022-06-18 NOTE — TOC Transition Note (Signed)
Transition of Care [facility]) - CM/SW Discharge Note   Patient Details  Name: Joshua Branch MRN: [id number] Date of Birth: 1961-04-02  Transition of Care [facility]) CM/SW Contact:  Bartholomew Crews, RN Phone Number: [phone] 06/18/2022, 10:19 AM   Clinical Narrative:     Patient to transition home today. [place] notified. Patient needs HH PT Face to Face order. No other TOC needs identified at this time.   Final next level of care: [place] Barriers to Discharge: No Barriers Identified   Patient Goals and CMS Choice Patient states their goals for this hospitalization and ongoing recovery are:: return home with family/friend support CMS Medicare.gov Compare Post Acute Care list provided to:: Patient Choice offered to / list presented to : Patient  Discharge Placement                       Discharge Plan and Services In-house Referral: NA Discharge Planning Services: CM Consult Post Acute Care Choice: Home Health          DME Arranged: N/A DME Agency: NA       HH Arranged: PT HH Agency: [place] Date [place]: 06/16/22 Time [place]: 1700 Representative spoke with at [place]: Tommi Rumps  Social Determinants of Health ([place]) Interventions     Readmission Risk Interventions     No data to display

## 2022-06-18 NOTE — Progress Notes (Addendum)
Physical Therapy Treatment Patient Details Name: Joshua Branch MRN: [id number] DOB: 02-Jul-1961 Today's Date: 06/18/2022   History of Present Illness Pt is a 61 y/o male s/p L BKA. PMH includes DM, HTN, CAD s/p CABG.    PT Comments    Pt received in recliner, agreeable to therapy session with emphasis on transfer, gait and stair training. Pt needing up to min guard for safety with transfers/gait and up to minA for stair ascent/descent via backward hop-to pattern and use of RW. Pt reports some anxiety regarding stair technique with RW but able to demo back safely with gait belt support, discussed options he has such as ordering railings to install in garage wall vs garage floor for ease of entry, recommend he consider this for safety, especially in case of emergency. Also discussed option for seated scooting up/down stairs on bottom if pt alone (in emergency situation) and not comfortable with single leg hopping in/out with RW, although standing from floor afterward would be challenging without external assist. Pt able to demo back supine LLE HEP and given visual/verbal demo and handout to reinforce sidelying and standing exercises if pt has someone present to guard for safety when standing initially. Pt continues to benefit from PT services to progress toward functional mobility goals.   Recommendations for follow up therapy are one component of a multi-disciplinary discharge planning process, led by the attending physician.  Recommendations may be updated based on patient status, additional functional criteria and insurance authorization.  Follow Up Recommendations  Home health PT     Assistance Recommended at Discharge Intermittent Supervision/Assistance  Patient can return home with the following Help with stairs or ramp for entrance;Assist for transportation;Assistance with cooking/housework   Equipment Recommendations  None recommended by PT (pt already has ordered DME, recommend he order  railings for his stairs at home in garage which have no rails currently.)    Recommendations for Other Services       Precautions / Restrictions Precautions Precautions: Fall Precaution Comments: L BKA Required Braces or Orthoses: Other Brace Other Brace: limb guard Restrictions Weight Bearing Restrictions: Yes LLE Weight Bearing: Non weight bearing (per MD pt cleared for use of knee scooter but also has RW at home) Other Position/Activity Restrictions: currently with wound vac     Mobility  Bed Mobility                    Transfers Overall transfer level: Needs assistance Equipment used: Rolling walker (2 wheels) Transfers: Sit to/from Stand, Bed to chair/wheelchair/BSC Sit to Stand: Min guard           General transfer comment: cues for safe UE placement as pt tending to plop to chair rather than reaching back when fatigued    Ambulation/Gait Ambulation/Gait assistance: Min guard Gait Distance (Feet): 12 Feet Assistive device: Rolling walker (2 wheels) Gait Pattern/deviations: Step-to pattern Gait velocity: Decreased     General Gait Details: min safety cues; short distance in PT gym to/from stairs, pt fatigued after and requesting to rest, defers longer distance in hallway after practicing stairs.   Stairs Stairs: Yes Stairs assistance: Min assist Stair Management: With walker, Backwards, Step to pattern ("hop-to") Number of Stairs: 2 General stair comments: visual/verbal demo prior to pt performing, min cues for safety as pt letting go of RW on one side when nervous (grabbing handrail although he has no railings at home), recommended he consider ordering railings for wall or to bolt to floor of garage on either  side of stairs for safety access into home as RW is less safe when he is performing on his own. Pt will have assistance for stair ascent with RW support initially getting into home and gait belt brought to room for him to take home and use for  car/stair/gait and transfers.   Wheelchair Mobility    Modified Rankin (Stroke Patients Only)       Balance Overall balance assessment: Needs assistance Sitting-balance support: No upper extremity supported, Feet supported Sitting balance-Leahy Scale: Normal     Standing balance support: Bilateral upper extremity supported, Reliant on assistive device for balance Standing balance-Leahy Scale: Poor Standing balance comment: Pt is reliant on UE support for balance.                            Cognition Arousal/Alertness: Awake/alert Behavior During Therapy: WFL for tasks assessed/performed Overall Cognitive Status: Within Functional Limits for tasks assessed                                          Exercises Other Exercises Other Exercises: HEP handout brought to room and visual/verbal demo for side-lying and standing LLE exercises as well as supine. Pt performed hip extension/quad sets and hip abduction with cues for technique with 5 sec hold x5-10 reps ea seated in recliner.    General Comments General comments (skin integrity, edema, etc.): no acute s/sx distress; wound vac in place      Pertinent Vitals/Pain Pain Assessment Pain Assessment: Faces Faces Pain Scale: Hurts a little bit Pain Location: L residual limb Pain Descriptors / Indicators: Grimacing, Guarding Pain Intervention(s): Monitored during session, Other (comment) (pt defers ice pack or pain meds before/after session)     PT Goals (current goals can now be found in the care plan section) Acute Rehab PT Goals Patient Stated Goal: to go home PT Goal Formulation: With patient Time For Goal Achievement: 06/30/22 Progress towards PT goals: Progressing toward goals    Frequency    Min 3X/week      PT Plan Current plan remains appropriate       AM-PAC PT "6 Clicks" Mobility   Outcome Measure  Help needed turning from your back to your side while in a flat bed without  using bedrails?: None Help needed moving from lying on your back to sitting on the side of a flat bed without using bedrails?: None Help needed moving to and from a bed to a chair (including a wheelchair)?: A Little Help needed standing up from a chair using your arms (e.g., wheelchair or bedside chair)?: A Little Help needed to walk in hospital room?: A Little Help needed climbing 3-5 steps with a railing? : A Little 6 Click Score: 20    End of Session Equipment Utilized During Treatment: Gait belt Activity Tolerance: Patient tolerated treatment well Patient left: in chair;with call bell/phone within reach Nurse Communication: Mobility status PT Visit Diagnosis: Unsteadiness on feet (R26.81);Muscle weakness (generalized) (M62.81)     Time: 0623-7628 PT Time Calculation (min) (ACUTE ONLY): 40 min  Charges:  $Gait Training: 8-22 mins $Therapeutic Exercise: 8-22 mins $Therapeutic Activity: 8-22 mins                     Muscab Brenneman P., PTA Acute Rehabilitation Services Secure Chat Preferred 9a-5:30pm Office: [place]  Myeesha Shane 06/18/2022, 12:24 PM

## 2022-06-19 LAB — SURGICAL PATHOLOGY

## 2022-06-22 ENCOUNTER — Other Ambulatory Visit: Payer: Self-pay | Admitting: Medical

## 2022-06-22 ENCOUNTER — Other Ambulatory Visit (HOSPITAL_BASED_OUTPATIENT_CLINIC_OR_DEPARTMENT_OTHER): Payer: Self-pay

## 2022-06-23 ENCOUNTER — Other Ambulatory Visit (HOSPITAL_BASED_OUTPATIENT_CLINIC_OR_DEPARTMENT_OTHER): Payer: Self-pay

## 2022-06-23 MED ORDER — LISINOPRIL 5 MG PO TABS
5.0000 mg | ORAL_TABLET | Freq: Every day | ORAL | 3 refills | Status: DC
  Filled 2022-06-23 – 2022-07-02 (×2): qty 30, 30d supply, fill #0
  Filled 2022-08-06 – 2022-10-15 (×2): qty 30, 30d supply, fill #1
  Filled 2022-11-12: qty 30, 30d supply, fill #2
  Filled 2023-01-08: qty 30, 30d supply, fill #3

## 2022-06-25 ENCOUNTER — Ambulatory Visit (INDEPENDENT_AMBULATORY_CARE_PROVIDER_SITE_OTHER): Payer: 59 | Admitting: Orthopedic Surgery

## 2022-06-25 ENCOUNTER — Other Ambulatory Visit (HOSPITAL_BASED_OUTPATIENT_CLINIC_OR_DEPARTMENT_OTHER): Payer: Self-pay

## 2022-06-25 ENCOUNTER — Encounter: Payer: Self-pay | Admitting: Orthopedic Surgery

## 2022-06-25 DIAGNOSIS — Z89512 Acquired absence of left leg below knee: Secondary | ICD-10-CM

## 2022-06-25 NOTE — Progress Notes (Signed)
Office Visit Note   [name]           Date of Birth: 10/08/1960           MRN: [id number] Visit Date: 06/25/2022              Requested by: Mackie Pai, PA-C [place],  [place] [address] PCP: Mackie Pai, PA-C  Chief Complaint  Patient presents with   Left Leg - Routine Post Op    06/15/2022 left BKS kerecis graft       HPI: Patient is a 61 year old gentleman who presents 10 days status post left transtibial amputation with application of Kerecis tissue graft reinforcement.  There is 100 cc in the wound VAC canister.  Patient states that it stopped working on Saturday.  Patient states he had 1 fall off the kneeling scooter with direct impact on the left residual limb.  Assessment & Plan: Visit Diagnoses:  1. Hx of BKA, left ([place])     Plan: Begin Dial soap cleansing in the shower daily dry gauze dressing Ace wrap and the stump shrinker on top.  Follow-Up Instructions: No follow-ups on file.   Ortho Exam  Patient is alert, oriented, no adenopathy, well-dressed, normal affect, normal respiratory effort. Examination there is slight dehiscence of the wound with the wound gaped open about 2 mm.  The wound has healthy granulation tissue there is no cellulitis there is a small amount of clear serosanguineous drainage.  4 x 4 Ace and a 4 XL shrinker was applied.  Imaging: No results found. No images are attached to the encounter.  Labs: Lab Results  Component Value Date   HGBA1C 6.2 (H) 01/04/2022   HGBA1C 7.3 (A) 06/14/2021   HGBA1C 7.4 (A) 01/27/2021   ESRSEDRATE 36 (H) 02/12/2022   REPTSTATUS 12/27/2021 FINAL 12/22/2021   GRAMSTAIN  12/22/2021    RARE WBC PRESENT,BOTH PMN AND MONONUCLEAR NO ORGANISMS SEEN    CULT  12/22/2021    RARE METHICILLIN RESISTANT STAPHYLOCOCCUS AUREUS NO ANAEROBES ISOLATED Performed at [facility], [place] [address], [place],  [address]    LABORGA METHICILLIN RESISTANT  STAPHYLOCOCCUS AUREUS 12/22/2021     Lab Results  Component Value Date   ALBUMIN 3.8 02/12/2022   ALBUMIN 4.3 05/01/2021   ALBUMIN 3.9 05/08/2019    Lab Results  Component Value Date   MG 2.1 05/08/2019   MG 2.2 06/26/2018   MG 3.0 (H) 06/26/2018   No results found for: "VD25OH"  No results found for: "PREALBUMIN"    Latest Ref Rng & Units 05/25/2022    6:38 AM 02/12/2022    3:04 PM 12/22/2021    5:56 AM  CBC EXTENDED  WBC 4.0 - 10.5 K/uL 6.3  9.0  7.7   RBC 4.22 - 5.81 MIL/uL 3.92  3.97  3.76   Hemoglobin 13.0 - 17.0 g/dL 10.7  12.0  11.9   HCT 39.0 - 52.0 % 33.4  36.4  34.8   Platelets 150 - 400 K/uL 371  325.0  256   NEUT# 1.4 - 7.7 K/uL  5.9    Lymph# 0.7 - 4.0 K/uL  2.1       There is no height or weight on file to calculate BMI.  Orders:  No orders of the defined types were placed in this encounter.  No orders of the defined types were placed in this encounter.    Procedures: No procedures performed  Clinical Data:  No additional findings.  ROS:  All other systems negative, except as noted in the HPI. Review of Systems  Objective: Vital Signs: There were no vitals taken for this visit.  Specialty Comments:  No specialty comments available.  PMFS History: Patient Active Problem List   Diagnosis Date Noted   S/P BKA (below knee amputation), left ([place]) 06/15/2022   Dehiscence of amputation stump of left lower extremity ([place]) 05/25/2022   Poorly controlled type 2 diabetes mellitus with circulatory disorder ([place]) 07/15/2018   Numbness in both hands 07/15/2018   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   Acute coronary syndrome ([place]) 06/22/2018   Coronary artery disease involving native coronary artery with unstable angina pectoris ([place]) 06/22/2018   Acute myocardial infarction ([place]) 06/22/2018   Hyperlipidemia    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   Vertigo 08/20/2016   Myringotomy tube status 08/20/2016   Ear fullness, left 08/20/2016    Asymmetrical left sensorineural hearing loss 02/06/2016   Neuropathy of left foot 02/03/2015   Charcot's joint of left foot, non-diabetic 02/03/2015   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   Cramping of hands 06/02/2014   Eustachian tube dysfunction 06/02/2014   HTN (hypertension) 05/07/2014   Obstructive sleep apnea 05/07/2014   Past Medical History:  Diagnosis Date   Acute myocardial infarction ([place]) 06/22/2018   Allergy    Anemia    iron deficiency   Charcot's joint of left foot, non-diabetic    Coronary artery disease involving native coronary artery with unstable angina pectoris ([place]) 06/22/2018   Diabetes ([place])    type 2   GERD (gastroesophageal reflux disease)    History of kidney stones    passed   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 ([place]) 01/18/2021   Hyperlipidemia    Hypertension    Neuropathy of left foot    Nodular basal cell carcinoma (BCC) 07/10/2019   Right V of Neck (curet and 5FU)   Osteomyelitis of third toe of left foot ([place])    Pneumonia    As a child   Right foot ulcer ([place])    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   LIMA to LAD, SVG to Diag, SVG to OM2, SVG to [name], EVH via right thigh and leg   Subacute osteomyelitis, left ankle and foot ([place]) 05/25/2022   Type II diabetes mellitus with complication, uncontrolled    Uncontrolled type 2 diabetes mellitus     Family History  Problem Relation Age of Onset   Diabetes Mother    Stroke Father    Hypertension Father    Hearing loss Father    Colon polyps Father    Colon cancer Neg Hx    Esophageal cancer Neg Hx    Prostate cancer Neg Hx     Past Surgical History:  Procedure Laterality Date   AMPUTATION Right 01/06/2020   Procedure: RIGHT FOOT 2ND RAY AMPUTATION;  Surgeon: Newt Minion, MD;  Location: [facility];  Service: Orthopedics;  Laterality: Right;   AMPUTATION Left 01/11/2021   Procedure: LEFT FOOT 3RD RAY AMPUTATION;  Surgeon: Newt Minion, MD;  Location: [facility];  Service: Orthopedics;   Laterality: Left;   AMPUTATION Left 12/22/2021   Procedure: LEFT TRANSMETATARSAL AMPUTATION;  Surgeon: Newt Minion, MD;  Location: [facility];  Service: Orthopedics;  Laterality: Left;   AMPUTATION Left 05/25/2022   Procedure: LEFT FOOT CHOPART;  Surgeon: Newt Minion, MD;  Location: [facility];  Service: Orthopedics;  Laterality:  Left;   AMPUTATION Left 06/15/2022   Procedure: LEFT BELOW KNEE AMPUTATION;  Surgeon: Newt Minion, MD;  Location: [facility];  Service: Orthopedics;  Laterality: Left;   APPLICATION OF WOUND VAC  12/22/2021   Procedure: APPLICATION OF WOUND VAC;  Surgeon: Newt Minion, MD;  Location: [facility];  Service: Orthopedics;;   APPLICATION OF WOUND VAC Left 05/25/2022   Procedure: APPLICATION OF WOUND VAC;  Surgeon: Newt Minion, MD;  Location: [facility];  Service: Orthopedics;  Laterality: Left;   COLONOSCOPY W/ POLYPECTOMY     CORONARY ARTERY BYPASS GRAFT N/A 06/25/2018   Procedure: CORONARY ARTERY BYPASS GRAFTING (CABG) TIMES FOUR USING LEFT INTERNAL MAMMARY ARTERY AND RIGHT ENDOSCOPICALLY HARVESTED SAPHENOUS VEIN;  Surgeon: Rexene Alberts, MD;  Location: [facility];  Service: Open Heart Surgery;  Laterality: N/A;   CYSTOSCOPY WITH STENT PLACEMENT Left 12/04/2021   Procedure: CYSTOSCOPY/RETROGRADE/ STENT PLACEMENT;  Surgeon: Vira Agar, MD;  Location: [facility];  Service: Urology;  Laterality: Left;   CYSTOSCOPY/URETEROSCOPY/HOLMIUM LASER/STENT PLACEMENT Left 01/05/2022   Procedure: CYSTOSCOPY LEFT URETERAL STENT REMOVAL  LEFT RETROGRADE PYELOGRAM URETEROSCOPY BASKET STONE EXTRACTION STENT PLACEMENT;  Surgeon: Lucas Mallow, MD;  [facility];  Service: Urology;  Laterality: Left;  1 HR FOR CASE   ENDOVEIN HARVEST OF GREATER SAPHENOUS VEIN Right 06/25/2018   Procedure: ENDOVEIN HARVEST OF GREATER SAPHENOUS VEIN;  Surgeon: Rexene Alberts, MD;  Location: [facility];  Service: Open Heart Surgery;  Laterality: Right;   EXTRACORPOREAL SHOCK WAVE LITHOTRIPSY Left 12/07/2021   Procedure:  EXTRACORPOREAL SHOCK WAVE LITHOTRIPSY (ESWL);  Surgeon: Festus Aloe, MD;  Location: [facility];  Service: Urology;  Laterality: Left;   LEFT HEART CATH AND CORONARY ANGIOGRAPHY N/A 06/22/2018   Procedure: LEFT HEART CATH AND CORONARY ANGIOGRAPHY;  Surgeon: Nigel Mormon, MD;  Location: [facility];  Service: Cardiovascular;  Laterality: N/A;   SKIN SPLIT GRAFT Left 02/28/2022   Procedure: LEFT FOOT SKIN GRAFT;  Surgeon: Newt Minion, MD;  Location: [facility];  Service: Orthopedics;  Laterality: Left;   surgical debridement  Right    Right foot ulcer   TEE WITHOUT CARDIOVERSION N/A 06/25/2018   Procedure: TRANSESOPHAGEAL ECHOCARDIOGRAM (TEE);  Surgeon: Rexene Alberts, MD;  Location: [facility];  Service: Open Heart Surgery;  Laterality: N/A;   Social History   Occupational History   Occupation: attorney  Tobacco Use   Smoking status: Never   Smokeless tobacco: Never  Vaping Use   Vaping Use: Never used  Substance and Sexual Activity   Alcohol use: Yes    Comment: maybe 2 beers a month   Drug use: Never   Sexual activity: Not on file

## 2022-06-26 ENCOUNTER — Encounter: Payer: 59 | Admitting: Family

## 2022-07-02 ENCOUNTER — Ambulatory Visit (INDEPENDENT_AMBULATORY_CARE_PROVIDER_SITE_OTHER): Payer: 59 | Admitting: Orthopedic Surgery

## 2022-07-02 ENCOUNTER — Other Ambulatory Visit (HOSPITAL_BASED_OUTPATIENT_CLINIC_OR_DEPARTMENT_OTHER): Payer: Self-pay

## 2022-07-02 ENCOUNTER — Encounter: Payer: Self-pay | Admitting: Orthopedic Surgery

## 2022-07-02 ENCOUNTER — Other Ambulatory Visit (HOSPITAL_COMMUNITY): Payer: Self-pay

## 2022-07-02 DIAGNOSIS — Z89512 Acquired absence of left leg below knee: Secondary | ICD-10-CM

## 2022-07-02 MED ORDER — SULFAMETHOXAZOLE-TRIMETHOPRIM 800-160 MG PO TABS
1.0000 | ORAL_TABLET | Freq: Two times a day (BID) | ORAL | 0 refills | Status: DC
  Filled 2022-07-02 (×2): qty 40, 20d supply, fill #0

## 2022-07-02 MED ORDER — SULFAMETHOXAZOLE-TRIMETHOPRIM 800-160 MG PO TABS: 1.0000 | ORAL_TABLET | Freq: Two times a day (BID) | ORAL | 0 refills | Status: DC

## 2022-07-02 NOTE — Addendum Note (Signed)
Addended by: Meridee Score on: 07/02/2022 01:23 PM   Modules accepted: Orders

## 2022-07-02 NOTE — Progress Notes (Addendum)
Office Visit Note   [name]           Date of Birth: 1960-08-07           MRN: [id number] Visit Date: 07/02/2022              Requested by: Mackie Pai, PA-C [place],  [place] [id number] PCP: Mackie Pai, PA-C  Chief Complaint  Patient presents with   Left Leg - Routine Post Op    06/15/2022 left BKA kerecis graft      HPI: Patient is seen in follow-up status post left transtibial amputation with tissue reinforcement with Kerecis.  Patient has been doing Dial soap cleansing he has been using a kneeling scooter and the stump shrinker.  Patient states he started having clear serosanguineous drainage yesterday.  Assessment & Plan: Visit Diagnoses:  1. 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  Follow-Up Instructions: Return in about 1 week (around 07/09/2022).   Ortho Exam  Patient is alert, oriented, no adenopathy, well-dressed, normal affect, normal respiratory effort. Examination there is no redness or cellulitis patient does have clear serosanguineous drainage.  Imaging: No results found.   Labs: Lab Results  Component Value Date   HGBA1C 6.2 (H) 01/04/2022   HGBA1C 7.3 (A) 06/14/2021   HGBA1C 7.4 (A) 01/27/2021   ESRSEDRATE 36 (H) 02/12/2022   REPTSTATUS 12/27/2021 FINAL 12/22/2021   GRAMSTAIN  12/22/2021    RARE WBC PRESENT,BOTH PMN AND MONONUCLEAR NO ORGANISMS SEEN    CULT  12/22/2021    RARE METHICILLIN RESISTANT STAPHYLOCOCCUS AUREUS NO ANAEROBES ISOLATED Performed at [facility], [place] [address], [place], [place] [address]    LABORGA METHICILLIN RESISTANT STAPHYLOCOCCUS AUREUS 12/22/2021     Lab Results  Component Value Date   ALBUMIN 3.8 02/12/2022   ALBUMIN 4.3 05/01/2021   ALBUMIN 3.9 05/08/2019    Lab Results  Component Value Date   MG 2.1  05/08/2019   MG 2.2 06/26/2018   MG 3.0 (H) 06/26/2018   No results found for: "VD25OH"  No results found for: "PREALBUMIN"    Latest Ref Rng & Units 05/25/2022    6:38 AM 02/12/2022    3:04 PM 12/22/2021    5:56 AM  CBC EXTENDED  WBC 4.0 - 10.5 K/uL 6.3  9.0  7.7   RBC 4.22 - 5.81 MIL/uL 3.92  3.97  3.76   Hemoglobin 13.0 - 17.0 g/dL 10.7  12.0  11.9   HCT 39.0 - 52.0 % 33.4  36.4  34.8   Platelets 150 - 400 K/uL 371  325.0  256   NEUT# 1.4 - 7.7 K/uL  5.9    Lymph# 0.7 - 4.0 K/uL  2.1       There is no height or weight on file to calculate BMI.  Orders:  No orders of the defined types were placed in this encounter.  Meds ordered this encounter  Medications   DISCONTD: sulfamethoxazole-trimethoprim (BACTRIM DS) 800-160 MG tablet    Sig: Take 1 tablet by mouth 2 (two) times daily.    Dispense:  40 tablet    Refill:  0   sulfamethoxazole-trimethoprim (BACTRIM DS) 800-160 MG tablet    Sig: Take 1 tablet by mouth 2 (two) times daily.    Dispense:  40 tablet  Refill:  0     Procedures: No procedures performed  Clinical Data: No additional findings.  ROS:  All other systems negative, except as noted in the HPI. Review of Systems  Objective: Vital Signs: There were no vitals taken for this visit.  Specialty Comments:  No specialty comments available.  PMFS History: Patient Active Problem List   Diagnosis Date Noted   S/P BKA (below knee amputation), left ([place]) 06/15/2022   Dehiscence of amputation stump of left lower extremity ([place]) 05/25/2022   Poorly controlled type 2 diabetes mellitus with circulatory disorder ([place]) 07/15/2018   Numbness in both hands 07/15/2018   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   Acute coronary syndrome ([place]) 06/22/2018   Coronary artery disease involving native coronary artery with unstable angina pectoris ([place]) 06/22/2018   Acute myocardial infarction ([place]) 06/22/2018   Hyperlipidemia    Active cochlear Meniere's disease of left ear  09/19/2016   Vertigo 08/20/2016   Myringotomy tube status 08/20/2016   Ear fullness, left 08/20/2016   Asymmetrical left sensorineural hearing loss 02/06/2016   Neuropathy of left foot 02/03/2015   Charcot's joint of left foot, non-diabetic 02/03/2015   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   Cramping of hands 06/02/2014   Eustachian tube dysfunction 06/02/2014   HTN (hypertension) 05/07/2014   Obstructive sleep apnea 05/07/2014   Past Medical History:  Diagnosis Date   Acute myocardial infarction ([place]) 06/22/2018   Allergy    Anemia    iron deficiency   Charcot's joint of left foot, non-diabetic    Coronary artery disease involving native coronary artery with unstable angina pectoris ([place]) 06/22/2018   Diabetes ([place])    type 2   GERD (gastroesophageal reflux disease)    History of kidney stones    passed   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 ([place]) 01/18/2021   Hyperlipidemia    Hypertension    Neuropathy of left foot    Nodular basal cell carcinoma (BCC) 07/10/2019   Right V of Neck (curet and 5FU)   Osteomyelitis of third toe of left foot ([place])    Pneumonia    As a child   Right foot ulcer ([place])    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   LIMA to LAD, SVG to Diag, SVG to OM2, SVG to [name], EVH via right thigh and leg   Subacute osteomyelitis, left ankle and foot ([place]) 05/25/2022   Type II diabetes mellitus with complication, uncontrolled    Uncontrolled type 2 diabetes mellitus     Family History  Problem Relation Age of Onset   Diabetes Mother    Stroke Father    Hypertension Father    Hearing loss Father    Colon polyps Father    Colon cancer Neg Hx    Esophageal cancer Neg Hx    Prostate cancer Neg Hx     Past Surgical History:  Procedure Laterality Date   AMPUTATION Right 01/06/2020   Procedure: RIGHT FOOT 2ND RAY AMPUTATION;  Surgeon: Newt Minion, MD;  Location: [facility];  Service: Orthopedics;  Laterality: Right;   AMPUTATION Left 01/11/2021    Procedure: LEFT FOOT 3RD RAY AMPUTATION;  Surgeon: Newt Minion, MD;  Location: [facility];  Service: Orthopedics;  Laterality: Left;   AMPUTATION Left 12/22/2021   Procedure: LEFT TRANSMETATARSAL AMPUTATION;  Surgeon: Newt Minion, MD;  Location: [facility];  Service: Orthopedics;  Laterality: Left;   AMPUTATION Left 05/25/2022   Procedure: LEFT FOOT CHOPART;  Surgeon: Newt Minion, MD;  Location: [facility];  Service: Orthopedics;  Laterality: Left;   AMPUTATION Left 06/15/2022   Procedure: LEFT BELOW KNEE AMPUTATION;  Surgeon: Newt Minion, MD;  Location: [facility];  Service: Orthopedics;  Laterality: Left;   APPLICATION OF WOUND VAC  12/22/2021   Procedure: APPLICATION OF WOUND VAC;  Surgeon: Newt Minion, MD;  Location: [facility];  Service: Orthopedics;;   APPLICATION OF WOUND VAC Left 05/25/2022   Procedure: APPLICATION OF WOUND VAC;  Surgeon: Newt Minion, MD;  Location: [facility];  Service: Orthopedics;  Laterality: Left;   COLONOSCOPY W/ POLYPECTOMY     CORONARY ARTERY BYPASS GRAFT N/A 06/25/2018   Procedure: CORONARY ARTERY BYPASS GRAFTING (CABG) TIMES FOUR USING LEFT INTERNAL MAMMARY ARTERY AND RIGHT ENDOSCOPICALLY HARVESTED SAPHENOUS VEIN;  Surgeon: Rexene Alberts, MD;  Location: [facility];  Service: Open Heart Surgery;  Laterality: N/A;   CYSTOSCOPY WITH STENT PLACEMENT Left 12/04/2021   Procedure: CYSTOSCOPY/RETROGRADE/ STENT PLACEMENT;  Surgeon: Vira Agar, MD;  [facility];  Service: Urology;  Laterality: Left;   CYSTOSCOPY/URETEROSCOPY/HOLMIUM LASER/STENT PLACEMENT Left 01/05/2022   Procedure: CYSTOSCOPY LEFT URETERAL STENT REMOVAL  LEFT RETROGRADE PYELOGRAM URETEROSCOPY BASKET STONE EXTRACTION STENT PLACEMENT;  Surgeon: Lucas Mallow, MD;  [facility];  Service: Urology;  Laterality: Left;  1 HR FOR CASE   ENDOVEIN HARVEST OF GREATER SAPHENOUS VEIN Right 06/25/2018   Procedure: ENDOVEIN HARVEST OF GREATER SAPHENOUS VEIN;  Surgeon: Rexene Alberts, MD;  Location: [facility];  Service:  Open Heart Surgery;  Laterality: Right;   EXTRACORPOREAL SHOCK WAVE LITHOTRIPSY Left 12/07/2021   Procedure: EXTRACORPOREAL SHOCK WAVE LITHOTRIPSY (ESWL);  Surgeon: Festus Aloe, MD;  Location: [facility];  Service: Urology;  Laterality: Left;   LEFT HEART CATH AND CORONARY ANGIOGRAPHY N/A 06/22/2018   Procedure: LEFT HEART CATH AND CORONARY ANGIOGRAPHY;  Surgeon: Nigel Mormon, MD;  Location: [facility];  Service: Cardiovascular;  Laterality: N/A;   SKIN SPLIT GRAFT Left 02/28/2022   Procedure: LEFT FOOT SKIN GRAFT;  Surgeon: Newt Minion, MD;  Location: [facility];  Service: Orthopedics;  Laterality: Left;   surgical debridement  Right    Right foot ulcer   TEE WITHOUT CARDIOVERSION N/A 06/25/2018   Procedure: TRANSESOPHAGEAL ECHOCARDIOGRAM (TEE);  Surgeon: Rexene Alberts, MD;  Location: [facility];  Service: Open Heart Surgery;  Laterality: N/A;   Social History   Occupational History   Occupation: attorney  Tobacco Use   Smoking status: Never   Smokeless tobacco: Never  Vaping Use   Vaping Use: Never used  Substance and Sexual Activity   Alcohol use: Yes    Comment: maybe 2 beers a month   Drug use: Never   Sexual activity: Not on file

## 2022-07-03 ENCOUNTER — Other Ambulatory Visit (HOSPITAL_BASED_OUTPATIENT_CLINIC_OR_DEPARTMENT_OTHER): Payer: Self-pay

## 2022-07-04 ENCOUNTER — Encounter: Payer: Self-pay | Admitting: Family

## 2022-07-04 ENCOUNTER — Telehealth: Payer: Self-pay | Admitting: Orthopedic Surgery

## 2022-07-04 ENCOUNTER — Ambulatory Visit (INDEPENDENT_AMBULATORY_CARE_PROVIDER_SITE_OTHER): Payer: 59 | Admitting: Family

## 2022-07-04 DIAGNOSIS — Z89512 Acquired absence of left leg below knee: Secondary | ICD-10-CM

## 2022-07-04 NOTE — Telephone Encounter (Signed)
Joshua Branch (PT) from [facility] called with an urgent message to Dr Sharol Given. Pt's wound looks infected and recommends pt be seen today. Pt called and left message with triage nurse about matter. Joshua Branch is also asking for home health skilled nursing for wound care. Please call Joshua Branch right away at [phone].

## 2022-07-04 NOTE — Progress Notes (Signed)
Post-Op Visit Note   [name]           Date of Birth: September 20, 1960           MRN: [id number] Visit Date: 07/04/2022 PCP: Mackie Pai, PA-C  Chief Complaint:  Chief Complaint  Patient presents with   Left Leg - Routine Post Op    06/15/2022 left BKA kerecis graft    HPI:  HPI The patient is a 61 year old gentleman seen status post left below-knee amputation on November 17.  Comes in today concern for purulent drainage she was placed on a course of Bactrim this Monday but has continued to have copious drainage.  Denies fevers or chills were endorses feeling poorly on Monday but has felt better since being on the Bactrim. Ortho Exam On examination of the left residual limb staples are in place there is purulent drainage centrally.  There is mild surrounding erythema no ascending cellulitis  Visit Diagnoses:  1. Hx of BKA, left ([place])     Plan: Will plan for revision below-knee amputation Friday of this week Dr. Sharol Given is in agreement the plan.  Discussed return precautions.  Follow-Up Instructions: No follow-ups on file.   Imaging: No results found.  Orders:  No orders of the defined types were placed in this encounter.  No orders of the defined types were placed in this encounter.    PMFS History: Patient Active Problem List   Diagnosis Date Noted   S/P BKA (below knee amputation), left ([place]) 06/15/2022   Dehiscence of amputation stump of left lower extremity ([place]) 05/25/2022   Poorly controlled type 2 diabetes mellitus with circulatory disorder ([place]) 07/15/2018   Numbness in both hands 07/15/2018   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   Acute coronary syndrome ([place]) 06/22/2018   Coronary artery disease involving native coronary artery with unstable angina pectoris ([place]) 06/22/2018   Acute myocardial infarction ([place]) 06/22/2018   Hyperlipidemia    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   Vertigo 08/20/2016   Myringotomy tube status  08/20/2016   Ear fullness, left 08/20/2016   Asymmetrical left sensorineural hearing loss 02/06/2016   Neuropathy of left foot 02/03/2015   Charcot's joint of left foot, non-diabetic 02/03/2015   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   Cramping of hands 06/02/2014   Eustachian tube dysfunction 06/02/2014   HTN (hypertension) 05/07/2014   Obstructive sleep apnea 05/07/2014   Past Medical History:  Diagnosis Date   Acute myocardial infarction ([place]) 06/22/2018   Allergy    Anemia    iron deficiency   Charcot's joint of left foot, non-diabetic    Coronary artery disease involving native coronary artery with unstable angina pectoris ([place]) 06/22/2018   Diabetes ([place])    type 2   GERD (gastroesophageal reflux disease)    History of kidney stones    passed   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 ([place]) 01/18/2021   Hyperlipidemia    Hypertension    Neuropathy of left foot    Nodular basal cell carcinoma ([facility]) 07/10/2019   Right V of Neck (curet and 5FU)   Osteomyelitis of third toe of left foot ([place])    Pneumonia    As a child   Right foot ulcer ([place])    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   LIMA to LAD, SVG to Diag, SVG to OM2, SVG to [name], EVH via right thigh and leg   Subacute osteomyelitis, left ankle and foot ([place])  05/25/2022   Type II diabetes mellitus with complication, uncontrolled    Uncontrolled type 2 diabetes mellitus     Family History  Problem Relation Age of Onset   Diabetes Mother    Stroke Father    Hypertension Father    Hearing loss Father    Colon polyps Father    Colon cancer Neg Hx    Esophageal cancer Neg Hx    Prostate cancer Neg Hx     Past Surgical History:  Procedure Laterality Date   AMPUTATION Right 01/06/2020   Procedure: RIGHT FOOT 2ND RAY AMPUTATION;  Surgeon: Newt Minion, MD;  Location: [place];  Service: Orthopedics;  Laterality: Right;   AMPUTATION Left 01/11/2021   Procedure: LEFT FOOT 3RD RAY AMPUTATION;  Surgeon: Newt Minion, MD;  Location: [place];  Service: Orthopedics;  Laterality: Left;   AMPUTATION Left 12/22/2021   Procedure: LEFT TRANSMETATARSAL AMPUTATION;  Surgeon: Newt Minion, MD;  Location: [place];  Service: Orthopedics;  Laterality: Left;   AMPUTATION Left 05/25/2022   Procedure: LEFT FOOT CHOPART;  Surgeon: Newt Minion, MD;  Location: [place];  Service: Orthopedics;  Laterality: Left;   AMPUTATION Left 06/15/2022   Procedure: LEFT BELOW KNEE AMPUTATION;  Surgeon: Newt Minion, MD;  Location: [place];  Service: Orthopedics;  Laterality: Left;   APPLICATION OF WOUND VAC  12/22/2021   Procedure: APPLICATION OF WOUND VAC;  Surgeon: Newt Minion, MD;  Location: [place];  Service: Orthopedics;;   APPLICATION OF WOUND VAC Left 05/25/2022   Procedure: APPLICATION OF WOUND VAC;  Surgeon: Newt Minion, MD;  Location: [place];  Service: Orthopedics;  Laterality: Left;   COLONOSCOPY W/ POLYPECTOMY     CORONARY ARTERY BYPASS GRAFT N/A 06/25/2018   Procedure: CORONARY ARTERY BYPASS GRAFTING (CABG) TIMES FOUR USING LEFT INTERNAL MAMMARY ARTERY AND RIGHT ENDOSCOPICALLY HARVESTED SAPHENOUS VEIN;  Surgeon: Rexene Alberts, MD;  Location: [place];  Service: Open Heart Surgery;  Laterality: N/A;   CYSTOSCOPY WITH STENT PLACEMENT Left 12/04/2021   Procedure: CYSTOSCOPY/RETROGRADE/ STENT PLACEMENT;  Surgeon: Vira Agar, MD;  Location: [facility];  Service: Urology;  Laterality: Left;   CYSTOSCOPY/URETEROSCOPY/HOLMIUM LASER/STENT PLACEMENT Left 01/05/2022   Procedure: CYSTOSCOPY LEFT URETERAL STENT REMOVAL  LEFT RETROGRADE PYELOGRAM URETEROSCOPY BASKET STONE EXTRACTION STENT PLACEMENT;  Surgeon: Lucas Mallow, MD;  Location: [facility];  Service: Urology;  Laterality: Left;  1 HR FOR CASE   ENDOVEIN HARVEST OF GREATER SAPHENOUS VEIN Right 06/25/2018   Procedure: ENDOVEIN HARVEST OF GREATER SAPHENOUS VEIN;  Surgeon: Rexene Alberts, MD;  Location: [place];  Service: Open Heart Surgery;  Laterality: Right;   EXTRACORPOREAL SHOCK  WAVE LITHOTRIPSY Left 12/07/2021   Procedure: EXTRACORPOREAL SHOCK WAVE LITHOTRIPSY (ESWL);  Surgeon: Festus Aloe, MD;  Location: [facility];  Service: Urology;  Laterality: Left;   LEFT HEART CATH AND CORONARY ANGIOGRAPHY N/A 06/22/2018   Procedure: LEFT HEART CATH AND CORONARY ANGIOGRAPHY;  Surgeon: Nigel Mormon, MD;  Location: [name];  Service: Cardiovascular;  Laterality: N/A;   SKIN SPLIT GRAFT Left 02/28/2022   Procedure: LEFT FOOT SKIN GRAFT;  Surgeon: Newt Minion, MD;  Location: [place];  Service: Orthopedics;  Laterality: Left;   surgical debridement  Right    Right foot ulcer   TEE WITHOUT CARDIOVERSION N/A 06/25/2018   Procedure: TRANSESOPHAGEAL ECHOCARDIOGRAM (TEE);  Surgeon: Rexene Alberts, MD;  Location: [place];  Service: Open Heart Surgery;  Laterality: N/A;  Social History   Occupational History   Occupation: attorney  Tobacco Use   Smoking status: Never   Smokeless tobacco: Never  Vaping Use   Vaping Use: Never used  Substance and Sexual Activity   Alcohol use: Yes    Comment: maybe 2 beers a month   Drug use: Never   Sexual activity: Not on file

## 2022-07-04 NOTE — Telephone Encounter (Signed)
Pt is sch for revision bka on 07/06/2022

## 2022-07-04 NOTE — Telephone Encounter (Signed)
Pt has an appt today at 2 pm will call with updates after appt.

## 2022-07-05 ENCOUNTER — Other Ambulatory Visit: Payer: Self-pay

## 2022-07-05 NOTE — Progress Notes (Signed)
Mr. Joshua Branch denies chest pain or shortness of breath.  Patient denies having any s/s of Covid in his household, also denies any known exposure to Covid.   Mr. Joshua Branch has type II diabetes.   I instructed Mr. Joshua Branch to hold all medications for diabetes.  I instructed patient to check CBG after awaking and every 2 hours until arrival  to the hospital.  I Instructed patient if CBG is less than 70 to take 4 Glucose Tablets or 1 tube of Glucose Gel or 1/2 cup of a clear juice. Recheck CBG in 15 minutes if CBG is not over 70 call, pre- op desk at [phone] for further instructions.

## 2022-07-06 ENCOUNTER — Encounter (HOSPITAL_COMMUNITY)
Admission: RE | Disposition: A | Discharge status: Home / Self Care | Payer: Self-pay | Source: Home / Self Care | Attending: Orthopedic Surgery

## 2022-07-06 ENCOUNTER — Encounter (HOSPITAL_COMMUNITY): Payer: Self-pay | Admitting: Orthopedic Surgery

## 2022-07-06 ENCOUNTER — Other Ambulatory Visit: Payer: Self-pay

## 2022-07-06 ENCOUNTER — Ambulatory Visit (HOSPITAL_COMMUNITY): Payer: 59 | Admitting: Anesthesiology

## 2022-07-06 ENCOUNTER — Ambulatory Visit (HOSPITAL_BASED_OUTPATIENT_CLINIC_OR_DEPARTMENT_OTHER): Payer: 59 | Admitting: Anesthesiology

## 2022-07-06 ENCOUNTER — Ambulatory Visit (HOSPITAL_COMMUNITY)
Admission: RE | Admit: 2022-07-06 | Discharge: 2022-07-06 | Disposition: A | Discharge status: Home / Self Care | Payer: 59 | Attending: Orthopedic Surgery | Admitting: Orthopedic Surgery

## 2022-07-06 ENCOUNTER — Other Ambulatory Visit (HOSPITAL_COMMUNITY): Payer: Self-pay

## 2022-07-06 DIAGNOSIS — K219 Gastro-esophageal reflux disease without esophagitis: Secondary | ICD-10-CM | POA: Insufficient documentation

## 2022-07-06 DIAGNOSIS — E119 Type 2 diabetes mellitus without complications: Secondary | ICD-10-CM | POA: Insufficient documentation

## 2022-07-06 DIAGNOSIS — M1990 Unspecified osteoarthritis, unspecified site: Secondary | ICD-10-CM | POA: Diagnosis not present

## 2022-07-06 DIAGNOSIS — T8744 Infection of amputation stump, left lower extremity: Secondary | ICD-10-CM | POA: Insufficient documentation

## 2022-07-06 DIAGNOSIS — G4730 Sleep apnea, unspecified: Secondary | ICD-10-CM | POA: Diagnosis not present

## 2022-07-06 DIAGNOSIS — Z7984 Long term (current) use of oral hypoglycemic drugs: Secondary | ICD-10-CM | POA: Diagnosis not present

## 2022-07-06 DIAGNOSIS — Z89512 Acquired absence of left leg below knee: Secondary | ICD-10-CM | POA: Diagnosis not present

## 2022-07-06 DIAGNOSIS — T8781 Dehiscence of amputation stump: Secondary | ICD-10-CM

## 2022-07-06 DIAGNOSIS — I2510 Atherosclerotic heart disease of native coronary artery without angina pectoris: Secondary | ICD-10-CM | POA: Diagnosis not present

## 2022-07-06 DIAGNOSIS — E785 Hyperlipidemia, unspecified: Secondary | ICD-10-CM | POA: Diagnosis not present

## 2022-07-06 DIAGNOSIS — I252 Old myocardial infarction: Secondary | ICD-10-CM | POA: Diagnosis not present

## 2022-07-06 DIAGNOSIS — T8789 Other complications of amputation stump: Secondary | ICD-10-CM

## 2022-07-06 DIAGNOSIS — Y835 Amputation of limb(s) as the cause of abnormal reaction of the patient, or of later complication, without mention of misadventure at the time of the procedure: Secondary | ICD-10-CM | POA: Diagnosis not present

## 2022-07-06 DIAGNOSIS — Z951 Presence of aortocoronary bypass graft: Secondary | ICD-10-CM | POA: Insufficient documentation

## 2022-07-06 DIAGNOSIS — Z85828 Personal history of other malignant neoplasm of skin: Secondary | ICD-10-CM | POA: Diagnosis not present

## 2022-07-06 DIAGNOSIS — I10 Essential (primary) hypertension: Secondary | ICD-10-CM | POA: Diagnosis not present

## 2022-07-06 DIAGNOSIS — L02416 Cutaneous abscess of left lower limb: Secondary | ICD-10-CM | POA: Insufficient documentation

## 2022-07-06 HISTORY — PX: STUMP REVISION: SHX6102

## 2022-07-06 LAB — CBC WITH DIFFERENTIAL/PLATELET
Abs Immature Granulocytes: 0.01 10*3/uL (ref 0.00–0.07)
Basophils Absolute: 0.1 10*3/uL (ref 0.0–0.1)
Basophils Relative: 2 %
Eosinophils Absolute: 0.7 10*3/uL — ABNORMAL HIGH (ref 0.0–0.5)
Eosinophils Relative: 17 %
HCT: 36.7 % — ABNORMAL LOW (ref 39.0–52.0)
Hemoglobin: 11 g/dL — ABNORMAL LOW (ref 13.0–17.0)
Immature Granulocytes: 0 %
Lymphocytes Relative: 26 %
Lymphs Abs: 1.1 10*3/uL (ref 0.7–4.0)
MCH: 26.3 pg (ref 26.0–34.0)
MCHC: 30 g/dL (ref 30.0–36.0)
MCV: 87.8 fL (ref 80.0–100.0)
Monocytes Absolute: 0.6 10*3/uL (ref 0.1–1.0)
Monocytes Relative: 13 %
Neutro Abs: 1.9 10*3/uL (ref 1.7–7.7)
Neutrophils Relative %: 42 %
Platelets: 345 10*3/uL (ref 150–400)
RBC: 4.18 MIL/uL — ABNORMAL LOW (ref 4.22–5.81)
RDW: 18.6 % — ABNORMAL HIGH (ref 11.5–15.5)
WBC: 4.4 10*3/uL (ref 4.0–10.5)
nRBC: 0 % (ref 0.0–0.2)

## 2022-07-06 LAB — COMPREHENSIVE METABOLIC PANEL
ALT: 15 U/L (ref 0–44)
AST: 18 U/L (ref 15–41)
Albumin: 3.3 g/dL — ABNORMAL LOW (ref 3.5–5.0)
Alkaline Phosphatase: 64 U/L (ref 38–126)
Anion gap: 11 (ref 5–15)
BUN: 20 mg/dL (ref 6–20)
CO2: 21 mmol/L — ABNORMAL LOW (ref 22–32)
Calcium: 8.9 mg/dL (ref 8.9–10.3)
Chloride: 107 mmol/L (ref 98–111)
Creatinine, Ser: 1.08 mg/dL (ref 0.61–1.24)
GFR, Estimated: >60 mL/min (ref >60)
Glucose, Bld: 137 mg/dL — ABNORMAL HIGH (ref 70–99)
Potassium: 4.3 mmol/L (ref 3.5–5.1)
Sodium: 139 mmol/L (ref 135–145)
Total Bilirubin: 0.5 mg/dL (ref 0.3–1.2)
Total Protein: 7.2 g/dL (ref 6.5–8.1)

## 2022-07-06 LAB — GLUCOSE, CAPILLARY
Glucose-Capillary: 144 mg/dL — ABNORMAL HIGH (ref 70–99)
Glucose-Capillary: 93 mg/dL (ref 70–99)
Glucose-Capillary: 122 mg/dL — ABNORMAL HIGH (ref 70–99)

## 2022-07-06 LAB — PREALBUMIN: Prealbumin: 17 mg/dL — ABNORMAL LOW (ref 18–38)

## 2022-07-06 LAB — SURGICAL PCR SCREEN
MRSA, PCR: NEGATIVE
Staphylococcus aureus: POSITIVE — AB

## 2022-07-06 LAB — VITAMIN D 25 HYDROXY (VIT D DEFICIENCY, FRACTURES): Vit D, 25-Hydroxy: 30.56 ng/mL (ref 30–100)

## 2022-07-06 SURGERY — REVISION, AMPUTATION SITE
Anesthesia: Monitor Anesthesia Care | Site: Knee | Laterality: Left

## 2022-07-06 MED ORDER — ORAL CARE MOUTH RINSE: 15.0000 mL | Freq: Once | OROMUCOSAL | Status: AC

## 2022-07-06 MED ORDER — CEFAZOLIN SODIUM-DEXTROSE 2-4 GM/100ML-% IV SOLN
2.0000 g | INTRAVENOUS | Status: AC
  Administered 2022-07-06: 2 g via INTRAVENOUS
  Filled 2022-07-06: qty 100

## 2022-07-06 MED ORDER — MIDAZOLAM HCL 2 MG/2ML IJ SOLN
INTRAMUSCULAR | Status: AC
  Administered 2022-07-06: 2 mg via INTRAVENOUS
  Filled 2022-07-06: qty 2

## 2022-07-06 MED ORDER — ACETAMINOPHEN 500 MG PO TABS
1000.0000 mg | ORAL_TABLET | Freq: Once | ORAL | Status: AC
  Administered 2022-07-06: 1000 mg via ORAL
  Filled 2022-07-06: qty 2

## 2022-07-06 MED ORDER — MIDAZOLAM HCL 2 MG/2ML IJ SOLN: 2.0000 mg | Freq: Once | INTRAMUSCULAR | Status: AC

## 2022-07-06 MED ORDER — FENTANYL CITRATE (PF) 100 MCG/2ML IJ SOLN: 100.0000 ug | Freq: Once | INTRAMUSCULAR | Status: AC

## 2022-07-06 MED ORDER — FENTANYL CITRATE (PF) 100 MCG/2ML IJ SOLN
INTRAMUSCULAR | Status: AC
  Administered 2022-07-06: 100 ug via INTRAVENOUS
  Filled 2022-07-06: qty 2

## 2022-07-06 MED ORDER — OXYCODONE-ACETAMINOPHEN 5-325 MG PO TABS
1.0000 | ORAL_TABLET | ORAL | 0 refills | Status: DC | PRN
  Filled 2022-07-06: qty 20, 4d supply, fill #0

## 2022-07-06 MED ORDER — CHLORHEXIDINE GLUCONATE 0.12 % MT SOLN
15.0000 mL | Freq: Once | OROMUCOSAL | Status: AC
  Administered 2022-07-06: 15 mL via OROMUCOSAL
  Filled 2022-07-06: qty 15

## 2022-07-06 MED ORDER — 0.9 % SODIUM CHLORIDE (POUR BTL) OPTIME
TOPICAL | Status: DC | PRN
  Administered 2022-07-06: 1000 mL

## 2022-07-06 MED ORDER — BUPIVACAINE-EPINEPHRINE (PF) 0.5% -1:200000 IJ SOLN
INTRAMUSCULAR | Status: DC | PRN
  Administered 2022-07-06: 30 mL via PERINEURAL
  Administered 2022-07-06: 15 mL via PERINEURAL

## 2022-07-06 MED ORDER — PROPOFOL 500 MG/50ML IV EMUL
INTRAVENOUS | Status: DC | PRN
  Administered 2022-07-06: 100 ug/kg/min via INTRAVENOUS

## 2022-07-06 MED ORDER — HYDROMORPHONE HCL 2 MG PO TABS
2.0000 mg | ORAL_TABLET | Freq: Four times a day (QID) | ORAL | 0 refills | Status: DC | PRN
  Filled 2022-07-06: qty 10, 3d supply, fill #0

## 2022-07-06 MED ORDER — LACTATED RINGERS IV SOLN: INTRAVENOUS | Status: DC

## 2022-07-06 MED ORDER — INSULIN ASPART 100 UNIT/ML IJ SOLN
INTRAMUSCULAR | Status: AC
  Filled 2022-07-06: qty 1

## 2022-07-06 MED ORDER — HYDROMORPHONE HCL 1 MG/ML IJ SOLN: 0.2500 mg | INTRAMUSCULAR | Status: DC | PRN

## 2022-07-06 MED ORDER — INSULIN ASPART 100 UNIT/ML IJ SOLN
0.0000 [IU] | INTRAMUSCULAR | Status: DC | PRN
  Administered 2022-07-06: 2 [IU] via SUBCUTANEOUS

## 2022-07-06 MED ORDER — ONDANSETRON HCL 4 MG/2ML IJ SOLN
INTRAMUSCULAR | Status: DC | PRN
  Administered 2022-07-06: 4 mg via INTRAVENOUS

## 2022-07-06 SURGICAL SUPPLY — 34 items
BAG COUNTER SPONGE SURGICOUNT (BAG) ×1 IMPLANT
BLADE SAW RECIP 87.9 MT (BLADE) IMPLANT
BLADE SURG 21 STRL SS (BLADE) ×1 IMPLANT
CANISTER PREVENA PLUS 150 (CANNISTER) IMPLANT
CANISTER WOUND CARE 500ML ATS (WOUND CARE) ×1 IMPLANT
COVER SURGICAL LIGHT HANDLE (MISCELLANEOUS) ×1 IMPLANT
DRAPE DERMATAC (DRAPES) IMPLANT
DRAPE EXTREMITY T 121X128X90 (DISPOSABLE) ×1 IMPLANT
DRAPE HALF SHEET 40X57 (DRAPES) ×1 IMPLANT
DRAPE INCISE IOBAN 66X45 STRL (DRAPES) ×1 IMPLANT
DRAPE U-SHAPE 47X51 STRL (DRAPES) ×2 IMPLANT
DRESSING PREVENA PLUS CUSTOM (GAUZE/BANDAGES/DRESSINGS) ×1 IMPLANT
DRSG PREVENA PLUS CUSTOM (GAUZE/BANDAGES/DRESSINGS) ×1
DURAPREP 26ML APPLICATOR (WOUND CARE) ×1 IMPLANT
ELECT REM PT RETURN 9FT ADLT (ELECTROSURGICAL) ×1
ELECTRODE REM PT RTRN 9FT ADLT (ELECTROSURGICAL) ×1 IMPLANT
GLOVE BIOGEL PI IND STRL 9 (GLOVE) ×1 IMPLANT
GLOVE SURG ORTHO 9.0 STRL STRW (GLOVE) ×1 IMPLANT
GOWN STRL REUS W/ TWL XL LVL3 (GOWN DISPOSABLE) ×2 IMPLANT
GOWN STRL REUS W/TWL XL LVL3 (GOWN DISPOSABLE) ×2
GRAFT SKIN WND SURGICLOSE M95 (Tissue) IMPLANT
KIT BASIN OR (CUSTOM PROCEDURE TRAY) ×1 IMPLANT
KIT TURNOVER KIT B (KITS) ×1 IMPLANT
MANIFOLD NEPTUNE II (INSTRUMENTS) ×1 IMPLANT
NS IRRIG 1000ML POUR BTL (IV SOLUTION) ×1 IMPLANT
PACK GENERAL/GYN (CUSTOM PROCEDURE TRAY) ×1 IMPLANT
PAD ARMBOARD 7.5X6 YLW CONV (MISCELLANEOUS) ×1 IMPLANT
PREVENA RESTOR ARTHOFORM 33X30 (CANNISTER) IMPLANT
PREVENA RESTOR ARTHOFORM 46X30 (CANNISTER) ×1 IMPLANT
STAPLER VISISTAT 35W (STAPLE) IMPLANT
SUT ETHILON 2 0 PSLX (SUTURE) ×2 IMPLANT
SUT SILK 2 0 (SUTURE)
SUT SILK 2-0 18XBRD TIE 12 (SUTURE) IMPLANT
TOWEL GREEN STERILE (TOWEL DISPOSABLE) ×1 IMPLANT

## 2022-07-06 NOTE — Anesthesia Postprocedure Evaluation (Signed)
Anesthesia Post Note  [name]  Procedure(s) Performed: REVISION LEFT BELOW KNEE AMPUTATION  WITH VAC PLACEMENT (Left: Knee)     Patient location during evaluation: PACU Anesthesia Type: Regional and MAC Level of consciousness: awake and alert Pain management: pain level controlled Vital Signs Assessment: post-procedure vital signs reviewed and stable Respiratory status: spontaneous breathing, nonlabored ventilation and respiratory function stable Cardiovascular status: stable and blood pressure returned to baseline Postop Assessment: no apparent nausea or vomiting Anesthetic complications: no  No notable events documented.  Last Vitals:  Vitals:   07/06/22 1410 07/06/22 1415  BP: 117/75 127/85  Pulse: 72 77  Resp: 18 18  SpO2: 100% 100%    Last Pain:  Vitals:   07/06/22 1212  PainSc: 0-No pain                 Lonisha Bobby,W. EDMOND

## 2022-07-06 NOTE — H&P (Signed)
Joshua Branch is an 61 y.o. male.   Chief Complaint: Purulent drainage right below-knee amputation. HPI: The patient is a 61 year old gentleman seen status post left below-knee amputation on November 17.  Comes in today concern for purulent drainage she was placed on a course of Bactrim this Monday but has continued to have copious drainage.   Denies fevers or chills were endorses feeling poorly on Monday but has felt better since being on the Bactrim.  Past Medical History:  Diagnosis Date   Acute myocardial infarction ([place]) 06/22/2018   Allergy    Anemia    iron deficiency   Charcot's joint of left foot, non-diabetic    Coronary artery disease involving native coronary artery with unstable angina pectoris ([place]) 06/22/2018   Diabetes ([place])    type 2   GERD (gastroesophageal reflux disease)    History of kidney stones    passed   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 ([place]) 01/18/2021   Hyperlipidemia    Hypertension    Neuropathy of left foot    Nodular basal cell carcinoma ([facility]) 07/10/2019   Right V of Neck (curet and 5FU)   Osteomyelitis of third toe of left foot ([name]    Pneumonia    As a child   Right foot ulcer ([place])    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   LIMA to LAD, SVG to Diag, SVG to OM2, SVG to [name], EVH via right thigh and leg   Subacute osteomyelitis, left ankle and foot ([place]) 05/25/2022   Type II diabetes mellitus with complication, uncontrolled    Uncontrolled type 2 diabetes mellitus     Past Surgical History:  Procedure Laterality Date   AMPUTATION Right 01/06/2020   Procedure: RIGHT FOOT 2ND RAY AMPUTATION;  Surgeon: Newt Minion, MD;  Location: [place];  Service: Orthopedics;  Laterality: Right;   AMPUTATION Left 01/11/2021   Procedure: LEFT FOOT 3RD RAY AMPUTATION;  Surgeon: Newt Minion, MD;  Location: [place];  Service: Orthopedics;  Laterality: Left;   AMPUTATION Left 12/22/2021   Procedure: LEFT TRANSMETATARSAL AMPUTATION;  Surgeon: Newt Minion, MD;  Location: [place];  Service: Orthopedics;  Laterality: Left;   AMPUTATION Left 05/25/2022   Procedure: LEFT FOOT CHOPART;  Surgeon: Newt Minion, MD;  Location: [place];  Service: Orthopedics;  Laterality: Left;   AMPUTATION Left 06/15/2022   Procedure: LEFT BELOW KNEE AMPUTATION;  Surgeon: Newt Minion, MD;  Location: [place];  Service: Orthopedics;  Laterality: Left;   APPLICATION OF WOUND VAC  12/22/2021   Procedure: APPLICATION OF WOUND VAC;  Surgeon: Newt Minion, MD;  Location: [place];  Service: Orthopedics;;   APPLICATION OF WOUND VAC Left 05/25/2022   Procedure: APPLICATION OF WOUND VAC;  Surgeon: Newt Minion, MD;  Location: [place];  Service: Orthopedics;  Laterality: Left;   COLONOSCOPY W/ POLYPECTOMY     CORONARY ARTERY BYPASS GRAFT N/A 06/25/2018   Procedure: CORONARY ARTERY BYPASS GRAFTING (CABG) TIMES FOUR USING LEFT INTERNAL MAMMARY ARTERY AND RIGHT ENDOSCOPICALLY HARVESTED SAPHENOUS VEIN;  Surgeon: Rexene Alberts, MD;  Location: [place];  Service: Open Heart Surgery;  Laterality: N/A;   CYSTOSCOPY WITH STENT PLACEMENT Left 12/04/2021   Procedure: CYSTOSCOPY/RETROGRADE/ STENT PLACEMENT;  Surgeon: Vira Agar, MD;  [facility];  Service: Urology;  Laterality: Left;   CYSTOSCOPY/URETEROSCOPY/HOLMIUM LASER/STENT PLACEMENT Left 01/05/2022   Procedure: CYSTOSCOPY LEFT URETERAL STENT REMOVAL  LEFT RETROGRADE PYELOGRAM URETEROSCOPY BASKET STONE  EXTRACTION STENT PLACEMENT;  Surgeon: Lucas Mallow, MD;  [facility];  Service: Urology;  Laterality: Left;  1 HR FOR CASE   ENDOVEIN HARVEST OF GREATER SAPHENOUS VEIN Right 06/25/2018   Procedure: ENDOVEIN HARVEST OF GREATER SAPHENOUS VEIN;  Surgeon: Rexene Alberts, MD;  Location: [place];  Service: Open Heart Surgery;  Laterality: Right;   EXTRACORPOREAL SHOCK WAVE LITHOTRIPSY Left 12/07/2021   Procedure: EXTRACORPOREAL SHOCK WAVE LITHOTRIPSY (ESWL);  Surgeon: Festus Aloe, MD;  Location: [facility];  Service: Urology;  Laterality: Left;   LEFT HEART CATH AND CORONARY ANGIOGRAPHY N/A 06/22/2018   Procedure: LEFT HEART CATH AND CORONARY ANGIOGRAPHY;  Surgeon: Nigel Mormon, MD;  Location: [facility];  Service: Cardiovascular;  Laterality: N/A;   SKIN SPLIT GRAFT Left 02/28/2022   Procedure: LEFT FOOT SKIN GRAFT;  Surgeon: Newt Minion, MD;  Location: [place];  Service: Orthopedics;  Laterality: Left;   surgical debridement  Right    Right foot ulcer   TEE WITHOUT CARDIOVERSION N/A 06/25/2018   Procedure: TRANSESOPHAGEAL ECHOCARDIOGRAM (TEE);  Surgeon: Rexene Alberts, MD;  Location: [place];  Service: Open Heart Surgery;  Laterality: N/A;    Family History  Problem Relation Age of Onset   Diabetes Mother    Stroke Father    Hypertension Father    Hearing loss Father    Colon polyps Father    Colon cancer Neg Hx    Esophageal cancer Neg Hx    Prostate cancer Neg Hx    Social History:  reports that he has never smoked. He has never used smokeless tobacco. He reports current alcohol use. He reports that he does not use drugs.  Allergies:  Allergies  Allergen Reactions   Apricot Kernel Oil [Prunus] Swelling    THROAT   Cherry Swelling    THROAT   Jardiance [Empagliflozin] Diarrhea and Nausea And Vomiting   Other Other (See Comments)    Fruits with a pit. Throat swells up. Can have them if cooked    Peach [Prunus Persica] Swelling    THROAT   Plum Pulp Swelling    THROAT    No medications prior to admission.    No results found for this or any previous visit (from the past 48 hour(s)). No results found.  Review of Systems  All other systems reviewed and are negative.   There were no vitals taken for this visit. Physical Exam  On examination of the left residual limb the staples are in place there is purulent drainage centrally.  There is mild surrounding erythema no ascending cellulitis. Assessment/Plan Assessment: Purulent abscess left  below-knee amputation.  Plan: We will plan for revision of the amputation.  Risk and benefits discussed including persistent infection need for additional surgery.  Patient states he understands wished to proceed at this time.  Newt Minion, MD 07/06/2022, 6:59 AM

## 2022-07-06 NOTE — Anesthesia Procedure Notes (Signed)
Anesthesia Regional Block: Popliteal block   Pre-Anesthetic Checklist: , timeout performed,  Correct Patient, Correct Site, Correct Laterality,  Correct Procedure, Correct Position, site marked,  Risks and benefits discussed,  Pre-op evaluation,  At surgeon's request and post-op pain management  Laterality: Left  Prep: Maximum Sterile Barrier Precautions used, chloraprep       Needles:  Injection technique: Single-shot  Needle Type: Echogenic Stimulator Needle     Needle Length: 9cm  Needle Gauge: 21     Additional Needles:   Procedures:,,,, ultrasound used (permanent image in chart),,    Narrative:  Start time: 07/06/2022 1:59 PM End time: 07/06/2022 2:09 PM Injection made incrementally with aspirations every 5 mL.  Performed by: Personally  Anesthesiologist: Roderic Palau, MD  Additional Notes: Adductor Canal block with 15cc of 0.5% Bupivicaine w/ 1:200k epi.

## 2022-07-06 NOTE — Transfer of Care (Signed)
Immediate Anesthesia Transfer of Care Note  [name]  Procedure(s) Performed: REVISION LEFT BELOW KNEE AMPUTATION  WITH VAC PLACEMENT (Left: Knee)  Patient Location: PACU  Anesthesia Type:MAC and Regional  Level of Consciousness: alert  and patient cooperative  Airway & Oxygen Therapy: Patient Spontanous Breathing and Patient connected to face mask oxygen  Post-op Assessment: Report given to RN, Post -op Vital signs reviewed and stable, and Patient moving all extremities  Post vital signs: Reviewed and stable  Last Vitals:  Vitals Value Taken Time  BP 99/69 07/06/22 1535  Temp    Pulse 82 07/06/22 1536  Resp 16 07/06/22 1536  SpO2 95 % 07/06/22 1536  Vitals shown include unvalidated device data.  Last Pain:  Vitals:   07/06/22 1212  PainSc: 0-No pain         Complications: No notable events documented.

## 2022-07-06 NOTE — Op Note (Signed)
07/06/2022  5:30 PM  PATIENT:  Joshua Branch    PRE-OPERATIVE DIAGNOSIS:  Abscess Left Below Knee Amputation  POST-OPERATIVE DIAGNOSIS:  Same  PROCEDURE:  REVISION LEFT BELOW KNEE AMPUTATION  WITH VAC PLACEMENT  SURGEON:  Newt Minion, MD  PHYSICIAN ASSISTANT:None ANESTHESIA:   General  PREOPERATIVE INDICATIONS:  [name] is a  61 y.o. male with a diagnosis of Abscess Left Below Knee Amputation who failed conservative measures and elected for surgical management.    The risks benefits and alternatives were discussed with the patient preoperatively including but not limited to the risks of infection, bleeding, nerve injury, cardiopulmonary complications, the need for revision surgery, among others, and the patient was willing to proceed.  OPERATIVE IMPLANTS: Kerecis micro powder 95 cm.  '@ENCIMAGES'$ @  OPERATIVE FINDINGS: Tissue margins were clear.  Involved tissue was sent for cultures.  OPERATIVE PROCEDURE: Patient was brought to the operating room and underwent a general anesthetic.  After adequate levels anesthesia obtained patient's left lower extremity was prepped using DuraPrep draped into a sterile field a timeout was called.  Elliptical incision was made around the wound dehiscence this was carried sharply down to bone.  The distal centimeter of the tibia and fibula were resected.  The wound margins were clear involved tissue was sent for cultures.  The wound was irrigated with normal saline.  The incision was closed using 2-0 nylon a Prevena wound VAC was applied this had a good suction fit patient was extubated taken the PACU in stable condition.   DISCHARGE PLANNING:  Antibiotic duration: Preoperative antibiotics  Weightbearing: Nonweightbearing on the left  Pain medication: Prescription for Dilaudid and Percocet  Dressing care/ Wound VAC: Wound VAC with 3 extra canisters  Ambulatory devices: Walker or crutches  Discharge to: Home.  Follow-up: In the  office 1 week post operative.

## 2022-07-06 NOTE — Anesthesia Preprocedure Evaluation (Addendum)
Anesthesia Evaluation  Patient identified by MRN, date of birth, ID band Patient awake    Reviewed: Allergy & Precautions, H&P , NPO status , Patient's Chart, lab work & pertinent test results  Airway Mallampati: II  TM Distance: >3 FB Neck ROM: Full    Dental no notable dental hx. (+) Teeth Intact, Dental Advisory Given   Pulmonary sleep apnea    Pulmonary exam normal breath sounds clear to auscultation       Cardiovascular hypertension, + CAD, + Past MI and + CABG   Rhythm:Regular Rate:Normal     Neuro/Psych negative neurological ROS  negative psych ROS   GI/Hepatic Neg liver ROS,GERD  Medicated,,  Endo/Other  diabetes, Type 2, Oral Hypoglycemic Agents    Renal/GU negative Renal ROS  negative genitourinary   Musculoskeletal  (+) Arthritis ,    Abdominal   Peds  Hematology  (+) Blood dyscrasia, anemia   Anesthesia Other Findings   Reproductive/Obstetrics negative OB ROS                             Anesthesia Physical Anesthesia Plan  ASA: 3  Anesthesia Plan: Regional and MAC   Post-op Pain Management: Tylenol PO (pre-op)*   Induction: Intravenous  PONV Risk Score and Plan: 2 and Propofol infusion, Midazolam and Ondansetron  Airway Management Planned: Natural Airway and Simple Face Mask  Additional Equipment:   Intra-op Plan:   Post-operative Plan:   Informed Consent: I have reviewed the patients History and Physical, chart, labs and discussed the procedure including the risks, benefits and alternatives for the proposed anesthesia with the patient or authorized representative who has indicated his/her understanding and acceptance.     Dental advisory given  Plan Discussed with: CRNA  Anesthesia Plan Comments:        Anesthesia Quick Evaluation

## 2022-07-09 ENCOUNTER — Telehealth: Payer: Self-pay

## 2022-07-09 ENCOUNTER — Encounter: Payer: 59 | Admitting: Orthopedic Surgery

## 2022-07-09 ENCOUNTER — Encounter (HOSPITAL_COMMUNITY): Payer: Self-pay | Admitting: Orthopedic Surgery

## 2022-07-09 NOTE — Telephone Encounter (Signed)
I called pt and advised of lab results and confirmed that the pt is taking his Bactrim DS BID. Will keep appt for Thursday and call with any questions or concerns.

## 2022-07-09 NOTE — Telephone Encounter (Signed)
-----   Message from Newt Minion, MD sent at 07/08/2022  7:50 PM EST ----- Call patient, cultures show staph sensitive to the bactrim ds, continue antibitics ----- Message ----- From: Interface, Lab In [place] Sent: 07/06/2022   7:37 PM EST To: Newt Minion, MD

## 2022-07-10 ENCOUNTER — Ambulatory Visit (INDEPENDENT_AMBULATORY_CARE_PROVIDER_SITE_OTHER): Payer: 59 | Admitting: Orthopedic Surgery

## 2022-07-10 ENCOUNTER — Encounter: Payer: Self-pay | Admitting: Orthopedic Surgery

## 2022-07-10 DIAGNOSIS — T8781 Dehiscence of amputation stump: Secondary | ICD-10-CM

## 2022-07-10 DIAGNOSIS — Z89512 Acquired absence of left leg below knee: Secondary | ICD-10-CM

## 2022-07-11 ENCOUNTER — Encounter: Payer: 59 | Admitting: Family

## 2022-07-11 ENCOUNTER — Encounter: Payer: Self-pay | Admitting: Orthopedic Surgery

## 2022-07-11 LAB — AEROBIC/ANAEROBIC CULTURE W GRAM STAIN (SURGICAL/DEEP WOUND)

## 2022-07-11 NOTE — Progress Notes (Signed)
Office Visit Note   [name]           Date of Birth: 1960/12/01           MRN: [id number] Visit Date: 07/10/2022              Requested by: Mackie Pai, PA-C [place],  [place] [address] PCP: Mackie Pai, PA-C  Chief Complaint  Patient presents with   Left Leg - Routine Post Op    07/06/2022 revision left BKA with kerecis 95 micro       HPI: Patient is a 61 year old gentleman who is seen status post right transtibial amputation with reinforcement with Kerecis micro graft.  He has filled 3 canisters the wound VAC is removed.  He is currently ambulating in a kneeling scooter.  Patient is on Bactrim DS twice a day.  Assessment & Plan: Visit Diagnoses:  1. Hx of BKA, left ([place])   2. Dehiscence of amputation stump of left lower extremity ([name]     Plan: Patient will continue with the Bactrim DS.  Will start dry dressing changes daily with compression.  Recommended decreasing the time that he is on the kneeling scooter to unload the residual limb.  Recommended elevation.  Follow-Up Instructions: Return in about 1 week (around 07/17/2022).   Ortho Exam  Patient is alert, oriented, no adenopathy, well-dressed, normal affect, normal respiratory effort. Examination the wound edges are well-approximated there is no cellulitis no purulent drainage patient does have clear serosanguineous drainage.  Imaging: No results found.   Labs: Lab Results  Component Value Date   HGBA1C 6.2 (H) 01/04/2022   HGBA1C 7.3 (A) 06/14/2021   HGBA1C 7.4 (A) 01/27/2021   ESRSEDRATE 36 (H) 02/12/2022   REPTSTATUS 07/11/2022 FINAL 07/06/2022   GRAMSTAIN  07/06/2022    RARE GRAM POSITIVE COCCI MODERATE WBC PRESENT, PREDOMINANTLY PMN    CULT  07/06/2022    FEW STAPHYLOCOCCUS AUREUS NO ANAEROBES ISOLATED Performed at [facility], [place] [address], [place], [place] [address]    LABORGA STAPHYLOCOCCUS AUREUS 07/06/2022     Lab Results   Component Value Date   ALBUMIN 3.3 (L) 07/06/2022   ALBUMIN 3.8 02/12/2022   ALBUMIN 4.3 05/01/2021   PREALBUMIN 17 (L) 07/06/2022    Lab Results  Component Value Date   MG 2.1 05/08/2019   MG 2.2 06/26/2018   MG 3.0 (H) 06/26/2018   Lab Results  Component Value Date   VD25OH 30.56 07/06/2022    Lab Results  Component Value Date   PREALBUMIN 17 (L) 07/06/2022      Latest Ref Rng & Units 07/06/2022   12:28 PM 05/25/2022    6:38 AM 02/12/2022    3:04 PM  CBC EXTENDED  WBC 4.0 - 10.5 K/uL 4.4  6.3  9.0   RBC 4.22 - 5.81 MIL/uL 4.18  3.92  3.97   Hemoglobin 13.0 - 17.0 g/dL 11.0  10.7  12.0   HCT 39.0 - 52.0 % 36.7  33.4  36.4   Platelets 150 - 400 K/uL 345  371  325.0   NEUT# 1.7 - 7.7 K/uL 1.9   5.9   Lymph# 0.7 - 4.0 K/uL 1.1   2.1      There is no height or weight on file to calculate BMI.  Orders:  No orders of the defined types were placed in this encounter.  No orders of the defined types were placed in this encounter.  Procedures: No procedures performed  Clinical Data: No additional findings.  ROS:  All other systems negative, except as noted in the HPI. Review of Systems  Objective: Vital Signs: There were no vitals taken for this visit.  Specialty Comments:  No specialty comments available.  PMFS History: Patient Active Problem List   Diagnosis Date Noted   S/P BKA (below knee amputation), left ([place]) 06/15/2022   Dehiscence of amputation stump of left lower extremity ([place]) 05/25/2022   Poorly controlled type 2 diabetes mellitus with circulatory disorder ([place]) 07/15/2018   Numbness in both hands 07/15/2018   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   Acute coronary syndrome ([place]) 06/22/2018   Coronary artery disease involving native coronary artery with unstable angina pectoris ([place]) 06/22/2018   Acute myocardial infarction ([place]) 06/22/2018   Hyperlipidemia    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   Vertigo 08/20/2016    Myringotomy tube status 08/20/2016   Ear fullness, left 08/20/2016   Asymmetrical left sensorineural hearing loss 02/06/2016   Neuropathy of left foot 02/03/2015   Charcot's joint of left foot, non-diabetic 02/03/2015   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   Cramping of hands 06/02/2014   Eustachian tube dysfunction 06/02/2014   HTN (hypertension) 05/07/2014   Obstructive sleep apnea 05/07/2014   Past Medical History:  Diagnosis Date   Acute myocardial infarction ([place]) 06/22/2018   Allergy    Anemia    iron deficiency   Charcot's joint of left foot, non-diabetic    Coronary artery disease involving native coronary artery with unstable angina pectoris ([place]) 06/22/2018   Diabetes ([place])    type 2   GERD (gastroesophageal reflux disease)    History of kidney stones    passed   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 ([place]) 01/18/2021   Hyperlipidemia    Hypertension    Neuropathy of left foot    Nodular basal cell carcinoma (BCC) 07/10/2019   Right V of Neck (curet and 5FU)   Osteomyelitis of third toe of left foot ([place])    Pneumonia    As a child   Right foot ulcer ([place])    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   LIMA to LAD, SVG to Diag, SVG to OM2, SVG to [name], EVH via right thigh and leg   Subacute osteomyelitis, left ankle and foot ([place]) 05/25/2022   Type II diabetes mellitus with complication, uncontrolled    Uncontrolled type 2 diabetes mellitus     Family History  Problem Relation Age of Onset   Diabetes Mother    Stroke Father    Hypertension Father    Hearing loss Father    Colon polyps Father    Colon cancer Neg Hx    Esophageal cancer Neg Hx    Prostate cancer Neg Hx     Past Surgical History:  Procedure Laterality Date   AMPUTATION Right 01/06/2020   Procedure: RIGHT FOOT 2ND RAY AMPUTATION;  Surgeon: Newt Minion, MD;  Location: [facility];  Service: Orthopedics;  Laterality: Right;   AMPUTATION Left 01/11/2021   Procedure: LEFT FOOT 3RD RAY AMPUTATION;   Surgeon: Newt Minion, MD;  Location: [facility];  Service: Orthopedics;  Laterality: Left;   AMPUTATION Left 12/22/2021   Procedure: LEFT TRANSMETATARSAL AMPUTATION;  Surgeon: Newt Minion, MD;  Location: [facility];  Service: Orthopedics;  Laterality: Left;   AMPUTATION Left 05/25/2022   Procedure: LEFT FOOT CHOPART;  Surgeon: Newt Minion, MD;  Location:  [facility];  Service: Orthopedics;  Laterality: Left;   AMPUTATION Left 06/15/2022   Procedure: LEFT BELOW KNEE AMPUTATION;  Surgeon: Newt Minion, MD;  Location: [facility];  Service: Orthopedics;  Laterality: Left;   APPLICATION OF WOUND VAC  12/22/2021   Procedure: APPLICATION OF WOUND VAC;  Surgeon: Newt Minion, MD;  Location: [facility];  Service: Orthopedics;;   APPLICATION OF WOUND VAC Left 05/25/2022   Procedure: APPLICATION OF WOUND VAC;  Surgeon: Newt Minion, MD;  Location: [facility];  Service: Orthopedics;  Laterality: Left;   COLONOSCOPY W/ POLYPECTOMY     CORONARY ARTERY BYPASS GRAFT N/A 06/25/2018   Procedure: CORONARY ARTERY BYPASS GRAFTING (CABG) TIMES FOUR USING LEFT INTERNAL MAMMARY ARTERY AND RIGHT ENDOSCOPICALLY HARVESTED SAPHENOUS VEIN;  Surgeon: Rexene Alberts, MD;  Location: [facility];  Service: Open Heart Surgery;  Laterality: N/A;   CYSTOSCOPY WITH STENT PLACEMENT Left 12/04/2021   Procedure: CYSTOSCOPY/RETROGRADE/ STENT PLACEMENT;  Surgeon: Vira Agar, MD;  [facility];  Service: Urology;  Laterality: Left;   CYSTOSCOPY/URETEROSCOPY/HOLMIUM LASER/STENT PLACEMENT Left 01/05/2022   Procedure: CYSTOSCOPY LEFT URETERAL STENT REMOVAL  LEFT RETROGRADE PYELOGRAM URETEROSCOPY BASKET STONE EXTRACTION STENT PLACEMENT;  Surgeon: Lucas Mallow, MD;  [facility];  Service: Urology;  Laterality: Left;  1 HR FOR CASE   ENDOVEIN HARVEST OF GREATER SAPHENOUS VEIN Right 06/25/2018   Procedure: ENDOVEIN HARVEST OF GREATER SAPHENOUS VEIN;  Surgeon: Rexene Alberts, MD;  Location: [facility];  Service: Open Heart Surgery;  Laterality: Right;    EXTRACORPOREAL SHOCK WAVE LITHOTRIPSY Left 12/07/2021   Procedure: EXTRACORPOREAL SHOCK WAVE LITHOTRIPSY (ESWL);  Surgeon: Festus Aloe, MD;  [facility];  Service: Urology;  Laterality: Left;   LEFT HEART CATH AND CORONARY ANGIOGRAPHY N/A 06/22/2018   Procedure: LEFT HEART CATH AND CORONARY ANGIOGRAPHY;  Surgeon: Nigel Mormon, MD;  Location: [facility];  Service: Cardiovascular;  Laterality: N/A;   SKIN SPLIT GRAFT Left 02/28/2022   Procedure: LEFT FOOT SKIN GRAFT;  Surgeon: Newt Minion, MD;  Location: [facility];  Service: Orthopedics;  Laterality: Left;   STUMP REVISION Left 07/06/2022   Procedure: REVISION LEFT BELOW KNEE AMPUTATION  WITH VAC PLACEMENT;  Surgeon: Newt Minion, MD;  Location: [facility];  Service: Orthopedics;  Laterality: Left;   surgical debridement  Right    Right foot ulcer   TEE WITHOUT CARDIOVERSION N/A 06/25/2018   Procedure: TRANSESOPHAGEAL ECHOCARDIOGRAM (TEE);  Surgeon: Rexene Alberts, MD;  Location: [facility];  Service: Open Heart Surgery;  Laterality: N/A;   Social History   Occupational History   Occupation: attorney  Tobacco Use   Smoking status: Never   Smokeless tobacco: Never  Vaping Use   Vaping Use: Never used  Substance and Sexual Activity   Alcohol use: Yes    Comment: maybe 2 beers a month   Drug use: Never   Sexual activity: Not on file

## 2022-07-12 ENCOUNTER — Telehealth: Payer: Self-pay

## 2022-07-12 ENCOUNTER — Encounter: Payer: 59 | Admitting: Orthopedic Surgery

## 2022-07-12 NOTE — Telephone Encounter (Signed)
Appt made

## 2022-07-12 NOTE — Telephone Encounter (Signed)
Can you please call this pt? He was in the office 07/10/2022 and I advised that he could come for follow up on 07/17/2022 but Dr. Sharol Given will not be in the office. I have sent a mychart message to the pt to ask if he would like to come that Monday 07/16/2022 or Thursday 07/19/2022 but have not heard back. Can you please call and let me know what he would like to do and I can open up a time. Thanks!

## 2022-07-17 ENCOUNTER — Other Ambulatory Visit (HOSPITAL_BASED_OUTPATIENT_CLINIC_OR_DEPARTMENT_OTHER): Payer: Self-pay

## 2022-07-19 ENCOUNTER — Other Ambulatory Visit (HOSPITAL_BASED_OUTPATIENT_CLINIC_OR_DEPARTMENT_OTHER): Payer: Self-pay

## 2022-07-19 ENCOUNTER — Ambulatory Visit (INDEPENDENT_AMBULATORY_CARE_PROVIDER_SITE_OTHER): Payer: 59 | Admitting: Orthopedic Surgery

## 2022-07-19 ENCOUNTER — Encounter: Payer: Self-pay | Admitting: Orthopedic Surgery

## 2022-07-19 ENCOUNTER — Telehealth: Payer: Self-pay | Admitting: Orthopedic Surgery

## 2022-07-19 DIAGNOSIS — Z89512 Acquired absence of left leg below knee: Secondary | ICD-10-CM

## 2022-07-19 MED ORDER — SULFAMETHOXAZOLE-TRIMETHOPRIM 800-160 MG PO TABS
1.0000 | ORAL_TABLET | Freq: Two times a day (BID) | ORAL | 0 refills | Status: DC
  Filled 2022-07-19: qty 40, 20d supply, fill #0

## 2022-07-19 MED ORDER — ALPRAZOLAM 0.5 MG PO TABS
0.5000 mg | ORAL_TABLET | Freq: Three times a day (TID) | ORAL | 1 refills | Status: DC | PRN
  Filled 2022-07-19: qty 30, 10d supply, fill #0
  Filled 2022-09-18: qty 30, 10d supply, fill #1
  Filled 2022-09-19: qty 30, 10d supply, fill #0

## 2022-07-19 NOTE — Telephone Encounter (Signed)
Joshua Branch (PT) from [facility] called to inform Dr Sharol Given pt hasn't a a physical therapy session in 2 wks. Ist wk pt denied appt and last few attempts to call pt for next appt he is not answering calls. Joshua Branch states he will discontinue sessions and asking permission from Dr Sharol Given to discharge pt from home health pt. Joshua Branch phone number is [phone].

## 2022-07-19 NOTE — Progress Notes (Signed)
Office Visit Note   [name]           Date of Birth: June 10, 1961           MRN: [id number] Visit Date: 07/19/2022              Requested by: Mackie Pai, PA-C [place],  [place] [address] PCP: Mackie Pai, PA-C  Chief Complaint  Patient presents with   Left Leg - Routine Post Op    07/06/2022 revision left BKA with kerecis 95 micro      HPI: Patient is a 61 year old gentleman who presents 2 weeks status post revision left transtibial amputation with Kerecis tissue graft.  Patient states he hit his residual limb yesterday and has pain and drainage.  Assessment & Plan: Visit Diagnoses:  1. Hx of BKA, left ([place])     Plan: Will have him continue with the Bactrim DS a prescription was also called in for Xanax.  Patient states that his symptoms that he is treated for Xanax are improving.  Follow-Up Instructions: Return in about 2 weeks (around 08/02/2022).   Ortho Exam  Patient is alert, oriented, no adenopathy, well-dressed, normal affect, normal respiratory effort. Examination there is slight dehiscence of the lateral excision there is clear serosanguineous drainage.  There is no cellulitis.  Imaging: No results found.    Labs: Lab Results  Component Value Date   HGBA1C 6.2 (H) 01/04/2022   HGBA1C 7.3 (A) 06/14/2021   HGBA1C 7.4 (A) 01/27/2021   ESRSEDRATE 36 (H) 02/12/2022   REPTSTATUS 07/11/2022 FINAL 07/06/2022   GRAMSTAIN  07/06/2022    RARE GRAM POSITIVE COCCI MODERATE WBC PRESENT, PREDOMINANTLY PMN    CULT  07/06/2022    FEW STAPHYLOCOCCUS AUREUS NO ANAEROBES ISOLATED Performed at [facility], [place] [address] [place], [place] [address]    LABORGA STAPHYLOCOCCUS AUREUS 07/06/2022     Lab Results  Component Value Date   ALBUMIN 3.3 (L) 07/06/2022   ALBUMIN 3.8 02/12/2022   ALBUMIN 4.3 05/01/2021   PREALBUMIN 17 (L) 07/06/2022    Lab Results  Component Value Date   MG 2.1 05/08/2019   MG 2.2  06/26/2018   MG 3.0 (H) 06/26/2018   Lab Results  Component Value Date   VD25OH 30.56 07/06/2022    Lab Results  Component Value Date   PREALBUMIN 17 (L) 07/06/2022      Latest Ref Rng & Units 07/06/2022   12:28 PM 05/25/2022    6:38 AM 02/12/2022    3:04 PM  CBC EXTENDED  WBC 4.0 - 10.5 K/uL 4.4  6.3  9.0   RBC 4.22 - 5.81 MIL/uL 4.18  3.92  3.97   Hemoglobin 13.0 - 17.0 g/dL 11.0  10.7  12.0   HCT 39.0 - 52.0 % 36.7  33.4  36.4   Platelets 150 - 400 K/uL 345  371  325.0   NEUT# 1.7 - 7.7 K/uL 1.9   5.9   Lymph# 0.7 - 4.0 K/uL 1.1   2.1      There is no height or weight on file to calculate BMI.  Orders:  No orders of the defined types were placed in this encounter.  Meds ordered this encounter  Medications   sulfamethoxazole-trimethoprim (BACTRIM DS) 800-160 MG tablet    Sig: Take 1 tablet by mouth 2 (two) times daily.    Dispense:  40 tablet    Refill:  0   ALPRAZolam Duanne Moron)  0.5 MG tablet    Sig: Take 1 tablet (0.5 mg total) by mouth 3 (three) times daily as needed for anxiety.    Dispense:  30 tablet    Refill:  1     Procedures: No procedures performed  Clinical Data: No additional findings.  ROS:  All other systems negative, except as noted in the HPI. Review of Systems  Objective: Vital Signs: There were no vitals taken for this visit.  Specialty Comments:  No specialty comments available.  PMFS History: Patient Active Problem List   Diagnosis Date Noted   S/P BKA (below knee amputation), left ([place]) 06/15/2022   Dehiscence of amputation stump of left lower extremity ([place]) 05/25/2022   Poorly controlled type 2 diabetes mellitus with circulatory disorder ([place]) 07/15/2018   Numbness in both hands 07/15/2018   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   Acute coronary syndrome ([place]) 06/22/2018   Coronary artery disease involving native coronary artery with unstable angina pectoris ([place]) 06/22/2018   Acute myocardial infarction ([place]) 06/22/2018   Hyperlipidemia     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   Vertigo 08/20/2016   Myringotomy tube status 08/20/2016   Ear fullness, left 08/20/2016   Asymmetrical left sensorineural hearing loss 02/06/2016   Neuropathy of left foot 02/03/2015   Charcot's joint of left foot, non-diabetic 02/03/2015   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   Cramping of hands 06/02/2014   Eustachian tube dysfunction 06/02/2014   HTN (hypertension) 05/07/2014   Obstructive sleep apnea 05/07/2014   Past Medical History:  Diagnosis Date   Acute myocardial infarction ([place]) 06/22/2018   Allergy    Anemia    iron deficiency   Charcot's joint of left foot, non-diabetic    Coronary artery disease involving native coronary artery with unstable angina pectoris ([place]) 06/22/2018   Diabetes ([place])    type 2   GERD (gastroesophageal reflux disease)    History of kidney stones    passed   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 ([place]) 01/18/2021   Hyperlipidemia    Hypertension    Neuropathy of left foot    Nodular basal cell carcinoma (BCC) 07/10/2019   Right V of Neck (curet and 5FU)   Osteomyelitis of third toe of left foot ([place])    Pneumonia    As a child   Right foot ulcer ([place])    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   LIMA to LAD, SVG to Diag, SVG to OM2, SVG to [name], EVH via right thigh and leg   Subacute osteomyelitis, left ankle and foot ([place]) 05/25/2022   Type II diabetes mellitus with complication, uncontrolled    Uncontrolled type 2 diabetes mellitus     Family History  Problem Relation Age of Onset   Diabetes Mother    Stroke Father    Hypertension Father    Hearing loss Father    Colon polyps Father    Colon cancer Neg Hx    Esophageal cancer Neg Hx    Prostate cancer Neg Hx     Past Surgical History:  Procedure Laterality Date   AMPUTATION Right 01/06/2020   Procedure: RIGHT FOOT 2ND RAY AMPUTATION;  Surgeon: Newt Minion, MD;  Location: [facility];  Service: Orthopedics;   Laterality: Right;   AMPUTATION Left 01/11/2021   Procedure: LEFT FOOT 3RD RAY AMPUTATION;  Surgeon: Newt Minion, MD;  Location: [facility];  Service: Orthopedics;  Laterality: Left;   AMPUTATION Left 12/22/2021  Procedure: LEFT TRANSMETATARSAL AMPUTATION;  Surgeon: Newt Minion, MD;  Location: [facility];  Service: Orthopedics;  Laterality: Left;   AMPUTATION Left 05/25/2022   Procedure: LEFT FOOT CHOPART;  Surgeon: Newt Minion, MD;  Location: [facility];  Service: Orthopedics;  Laterality: Left;   AMPUTATION Left 06/15/2022   Procedure: LEFT BELOW KNEE AMPUTATION;  Surgeon: Newt Minion, MD;  Location: [facility];  Service: Orthopedics;  Laterality: Left;   APPLICATION OF WOUND VAC  12/22/2021   Procedure: APPLICATION OF WOUND VAC;  Surgeon: Newt Minion, MD;  Location: [facility];  Service: Orthopedics;;   APPLICATION OF WOUND VAC Left 05/25/2022   Procedure: APPLICATION OF WOUND VAC;  Surgeon: Newt Minion, MD;  Location: [facility];  Service: Orthopedics;  Laterality: Left;   COLONOSCOPY W/ POLYPECTOMY     CORONARY ARTERY BYPASS GRAFT N/A 06/25/2018   Procedure: CORONARY ARTERY BYPASS GRAFTING (CABG) TIMES FOUR USING LEFT INTERNAL MAMMARY ARTERY AND RIGHT ENDOSCOPICALLY HARVESTED SAPHENOUS VEIN;  Surgeon: Rexene Alberts, MD;  Location: [facility];  Service: Open Heart Surgery;  Laterality: N/A;   CYSTOSCOPY WITH STENT PLACEMENT Left 12/04/2021   Procedure: CYSTOSCOPY/RETROGRADE/ STENT PLACEMENT;  Surgeon: Vira Agar, MD;  [facility];  Service: Urology;  Laterality: Left;   CYSTOSCOPY/URETEROSCOPY/HOLMIUM LASER/STENT PLACEMENT Left 01/05/2022   Procedure: CYSTOSCOPY LEFT URETERAL STENT REMOVAL  LEFT RETROGRADE PYELOGRAM URETEROSCOPY BASKET STONE EXTRACTION STENT PLACEMENT;  Surgeon: Lucas Mallow, MD;  [facility];  Service: Urology;  Laterality: Left;  1 HR FOR CASE   ENDOVEIN HARVEST OF GREATER SAPHENOUS VEIN Right 06/25/2018   Procedure: ENDOVEIN HARVEST OF GREATER SAPHENOUS VEIN;  Surgeon:  Rexene Alberts, MD;  Location: [facility];  Service: Open Heart Surgery;  Laterality: Right;   EXTRACORPOREAL SHOCK WAVE LITHOTRIPSY Left 12/07/2021   Procedure: EXTRACORPOREAL SHOCK WAVE LITHOTRIPSY (ESWL);  Surgeon: Festus Aloe, MD;  Location: [facility];  Service: Urology;  Laterality: Left;   LEFT HEART CATH AND CORONARY ANGIOGRAPHY N/A 06/22/2018   Procedure: LEFT HEART CATH AND CORONARY ANGIOGRAPHY;  Surgeon: Nigel Mormon, MD;  Location: [facility];  Service: Cardiovascular;  Laterality: N/A;   SKIN SPLIT GRAFT Left 02/28/2022   Procedure: LEFT FOOT SKIN GRAFT;  Surgeon: Newt Minion, MD;  Location: [facility];  Service: Orthopedics;  Laterality: Left;   STUMP REVISION Left 07/06/2022   Procedure: REVISION LEFT BELOW KNEE AMPUTATION  WITH VAC PLACEMENT;  Surgeon: Newt Minion, MD;  Location: [facility];  Service: Orthopedics;  Laterality: Left;   surgical debridement  Right    Right foot ulcer   TEE WITHOUT CARDIOVERSION N/A 06/25/2018   Procedure: TRANSESOPHAGEAL ECHOCARDIOGRAM (TEE);  Surgeon: Rexene Alberts, MD;  Location: [facility];  Service: Open Heart Surgery;  Laterality: N/A;   Social History   Occupational History   Occupation: attorney  Tobacco Use   Smoking status: Never   Smokeless tobacco: Never  Vaping Use   Vaping Use: Never used  Substance and Sexual Activity   Alcohol use: Yes    Comment: maybe 2 beers a month   Drug use: Never   Sexual activity: Not on file

## 2022-07-19 NOTE — Telephone Encounter (Signed)
I called and sw PT advised ok per MD to d/c from service if pt is unwilling to make and keep appts.

## 2022-07-20 ENCOUNTER — Other Ambulatory Visit (HOSPITAL_COMMUNITY): Payer: Self-pay

## 2022-08-01 ENCOUNTER — Encounter: Payer: Self-pay | Admitting: Orthopedic Surgery

## 2022-08-02 ENCOUNTER — Encounter (HOSPITAL_COMMUNITY): Payer: Self-pay | Admitting: Orthopedic Surgery

## 2022-08-02 ENCOUNTER — Other Ambulatory Visit (HOSPITAL_BASED_OUTPATIENT_CLINIC_OR_DEPARTMENT_OTHER): Payer: Self-pay

## 2022-08-02 ENCOUNTER — Ambulatory Visit (INDEPENDENT_AMBULATORY_CARE_PROVIDER_SITE_OTHER): Payer: 59 | Admitting: Orthopedic Surgery

## 2022-08-02 ENCOUNTER — Other Ambulatory Visit (HOSPITAL_COMMUNITY): Payer: Self-pay

## 2022-08-02 DIAGNOSIS — T8781 Dehiscence of amputation stump: Secondary | ICD-10-CM

## 2022-08-02 DIAGNOSIS — Z89512 Acquired absence of left leg below knee: Secondary | ICD-10-CM

## 2022-08-02 MED ORDER — HYDROMORPHONE HCL 2 MG PO TABS
2.0000 mg | ORAL_TABLET | Freq: Four times a day (QID) | ORAL | 0 refills | Status: DC | PRN
  Filled 2022-08-02: qty 15, 4d supply, fill #0

## 2022-08-02 NOTE — H&P (Signed)
Joshua Branch is an 62 y.o. male.   Chief Complaint: Dehiscence left below-knee amputation. HPI: Patient is a 62 year old gentleman who is 2 weeks status post revision left transtibial amputation with Kerecis tissue graft reinforcement.  Patient states he has had some minor trauma but nothing major and has had progressive wound dehiscence.  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 ([place]) 01/18/2021   Hyperlipidemia    Hypertension    Neuropathy of left foot    Nodular basal cell carcinoma (BCC) 07/10/2019   Right V of Neck (curet and 5FU)   Osteomyelitis of third toe of left foot ([name]    Pneumonia    As a child   Right foot ulcer ([place])    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   LIMA to LAD, SVG to Diag, SVG to OM2, SVG to [name], EVH via right thigh and leg   Subacute osteomyelitis, left ankle and foot ([place]) 05/25/2022   Type II diabetes mellitus with complication, uncontrolled    Uncontrolled type 2 diabetes mellitus     Past Surgical History:  Procedure Laterality Date   AMPUTATION Right 01/06/2020   Procedure: RIGHT FOOT 2ND RAY AMPUTATION;  Surgeon: Newt Minion, MD;  Location: [place];  Service: Orthopedics;  Laterality: Right;   AMPUTATION Left 01/11/2021   Procedure: LEFT FOOT 3RD RAY AMPUTATION;  Surgeon: Newt Minion, MD;  Location: [place];  Service: Orthopedics;  Laterality: Left;   AMPUTATION Left 12/22/2021   Procedure: LEFT TRANSMETATARSAL AMPUTATION;  Surgeon: Newt Minion, MD;  Location: [place];  Service: Orthopedics;  Laterality: Left;   AMPUTATION Left 05/25/2022    Procedure: LEFT FOOT CHOPART;  Surgeon: Newt Minion, MD;  Location: [place];  Service: Orthopedics;  Laterality: Left;   AMPUTATION Left 06/15/2022   Procedure: LEFT BELOW KNEE AMPUTATION;  Surgeon: Newt Minion, MD;  Location: [place];  Service: Orthopedics;  Laterality: Left;   APPLICATION OF WOUND VAC  12/22/2021   Procedure: APPLICATION OF WOUND VAC;  Surgeon: Newt Minion, MD;  Location: [place];  Service: Orthopedics;;   APPLICATION OF WOUND VAC Left 05/25/2022   Procedure: APPLICATION OF WOUND VAC;  Surgeon: Newt Minion, MD;  Location: [place];  Service: Orthopedics;  Laterality: Left;   COLONOSCOPY W/ POLYPECTOMY     CORONARY ARTERY BYPASS GRAFT N/A 06/25/2018   Procedure: CORONARY ARTERY BYPASS GRAFTING (CABG) TIMES FOUR USING LEFT INTERNAL MAMMARY ARTERY AND RIGHT ENDOSCOPICALLY HARVESTED SAPHENOUS VEIN;  Surgeon: Rexene Alberts, MD;  Location: [place];  Service: Open Heart Surgery;  Laterality: N/A;   CYSTOSCOPY WITH STENT PLACEMENT Left 12/04/2021   Procedure: CYSTOSCOPY/RETROGRADE/ STENT PLACEMENT;  Surgeon: Vira Agar, MD;  [facility];  Service: Urology;  Laterality: Left;   CYSTOSCOPY/URETEROSCOPY/HOLMIUM LASER/STENT PLACEMENT Left 01/05/2022   Procedure: CYSTOSCOPY LEFT URETERAL STENT REMOVAL  LEFT RETROGRADE PYELOGRAM URETEROSCOPY BASKET STONE EXTRACTION STENT PLACEMENT;  Surgeon: Lucas Mallow, MD;  [facility];  Service: Urology;  Laterality: Left;  1 HR FOR  CASE   ENDOVEIN HARVEST OF GREATER SAPHENOUS VEIN Right 06/25/2018   Procedure: ENDOVEIN HARVEST OF GREATER SAPHENOUS VEIN;  Surgeon: Rexene Alberts, MD;  Location: [place];  Service: Open Heart Surgery;  Laterality: Right;   EXTRACORPOREAL SHOCK WAVE LITHOTRIPSY Left 12/07/2021   Procedure: EXTRACORPOREAL SHOCK WAVE LITHOTRIPSY (ESWL);  Surgeon: Festus Aloe, MD;  Location[facility];  Service: Urology;  Laterality: Left;   LEFT HEART CATH AND CORONARY ANGIOGRAPHY N/A 06/22/2018    Procedure: LEFT HEART CATH AND CORONARY ANGIOGRAPHY;  Surgeon: Nigel Mormon, MD;  Location: [place] [facility];  Service: Cardiovascular;  Laterality: N/A;   SKIN SPLIT GRAFT Left 02/28/2022   Procedure: LEFT FOOT SKIN GRAFT;  Surgeon: Newt Minion, MD;  Location: [place];  Service: Orthopedics;  Laterality: Left;   STUMP REVISION Left 07/06/2022   Procedure: REVISION LEFT BELOW KNEE AMPUTATION  WITH VAC PLACEMENT;  Surgeon: Newt Minion, MD;  Location: [place];  Service: Orthopedics;  Laterality: Left;   surgical debridement  Right    Right foot ulcer   TEE WITHOUT CARDIOVERSION N/A 06/25/2018   Procedure: TRANSESOPHAGEAL ECHOCARDIOGRAM (TEE);  Surgeon: Rexene Alberts, MD;  Location: [place];  Service: Open Heart Surgery;  Laterality: N/A;    Family History  Problem Relation Age of Onset   Diabetes Mother    Stroke Father    Hypertension Father    Hearing loss Father    Colon polyps Father    Colon cancer Neg Hx    Esophageal cancer Neg Hx    Prostate cancer Neg Hx    Social History:  reports that he has never smoked. He has never used smokeless tobacco. He reports current alcohol use. He reports that he does not use drugs.  Allergies:  Allergies  Allergen Reactions   Apricot Kernel Oil [Prunus] Swelling    THROAT   Cherry Swelling    THROAT   Jardiance [Empagliflozin] Diarrhea and Nausea And Vomiting   Other Other (See Comments)    Fruits with a pit. Throat swells up. Can have them if cooked    Peach [Prunus Persica] Swelling    THROAT   Plum Pulp Swelling    THROAT    No medications prior to admission.    No results found for this or any previous visit (from the past 48 hour(s)). No results found.  Review of Systems  All other systems reviewed and are negative.   There were no vitals taken for this visit. Physical Exam  Examination patient is alert oriented no adenopathy well-dressed normal affect normal respiratory effort.  Patient has large dehiscence of  the surgical wound with exposed bone.  The medial aspect of the wound is still well-approximated without cellulitis.  There is good hair growth.  There is no purulent drainage. Assessment/Plan Assessment: Dehiscence left transtibial amputation.  Plan: Will plan for revision of the amputation.  Risk and benefits were discussed patient states he understands wished to proceed at this time as an outpatient.  Newt Minion, MD 08/02/2022, 6:10 PM

## 2022-08-02 NOTE — Progress Notes (Signed)
PCP - Mackie Pai, PA-C Cardiologist - Dr Gwenlyn Found  Chest x-ray - 11/21/21 (2V) EKG - 12/06/21 Stress Test - n/a ECHO - 06/22/18 Cardiac Cath - 06/22/18  ICD Pacemaker/Loop - n/a  Sleep Study -  Yes - tested yrs ago CPAP - no CPAP  Diabetes Type 2 Do not take Metformin or Glipizide on the morning of surgery.  If your blood sugar is less than 70 mg/dL, you will need to treat for low blood sugar: Treat a low blood sugar (less than 70 mg/dL) with  cup of clear juice (cranberry or apple), 4 glucose tablets, OR glucose gel. Recheck blood sugar in 15 minutes after treatment (to make sure it is greater than 70 mg/dL). If your blood sugar is not greater than 70 mg/dL on recheck, call [phone] for further instructions.  ERAS: Clear liquids til 0830 DOS  Anesthesia review: Yes. Dr Smith Robert- Anesthesia to review on DOS  STOP now taking any Aspirin (unless otherwise instructed by your surgeon), Aleve, Naproxen, Ibuprofen, Motrin, Advil, Goody's, BC's, all herbal medications, fish oil, and all vitamins.   Coronavirus Screening Do you have any of the following symptoms:  Cough yes/no: No Fever (>100.79F)  yes/no: No Runny nose yes/no: No Sore throat yes/no: No Difficulty breathing/shortness of breath  yes/no: No  Have you traveled in the last 14 days and where? yes/no: No  Patient verbalized understanding of instructions that were given via phone.

## 2022-08-02 NOTE — Progress Notes (Signed)
PCP - Mackie Pai, PA-C Cardiologist - Dr Gwenlyn Found  Chest x-ray - 11/21/21 (2V) EKG - 12/06/21 Stress Test - n/a ECHO - 06/22/18 Cardiac Cath - 06/22/18  ICD Pacemaker/Loop - n/a  Sleep Study -  Yes - tested yrs ago CPAP - no CPAP  Diabetes Type 2 Do not take Metformin or Glipizide on the morning of surgery.  If your blood sugar is less than 70 mg/dL, you will need to treat for low blood sugar: Treat a low blood sugar (less than 70 mg/dL) with  cup of clear juice (cranberry or apple), 4 glucose tablets, OR glucose gel. Recheck blood sugar in 15 minutes after treatment (to make sure it is greater than 70 mg/dL). If your blood sugar is not greater than 70 mg/dL on recheck, call [phone] for further instructions.  ERAS: Clear liquids til 0830 DOS  Anesthesia review: Yes. Dr Smith Robert- Anesthesia to review on DOS  STOP now taking any Aspirin (unless otherwise instructed by your surgeon), Aleve, Naproxen, Ibuprofen, Motrin, Advil, Goody's, BC's, all herbal medications, fish oil, and all vitamins.   Coronavirus Screening Do you have any of the following symptoms:  Cough yes/no: No Fever (>100.28F)  yes/no: No Runny nose yes/no: No Sore throat yes/no: No Difficulty breathing/shortness of breath  yes/no: No  Have you traveled in the last 14 days and where? yes/no: No  Patient verbalized understanding of instructions that were given via phone.

## 2022-08-03 ENCOUNTER — Ambulatory Visit (HOSPITAL_BASED_OUTPATIENT_CLINIC_OR_DEPARTMENT_OTHER): Payer: 59 | Admitting: Certified Registered Nurse Anesthetist

## 2022-08-03 ENCOUNTER — Ambulatory Visit (HOSPITAL_COMMUNITY)
Admission: RE | Admit: 2022-08-03 | Discharge: 2022-08-03 | Disposition: A | Discharge status: Home / Self Care | Payer: 59 | Attending: Orthopedic Surgery | Admitting: Orthopedic Surgery

## 2022-08-03 ENCOUNTER — Other Ambulatory Visit: Payer: Self-pay

## 2022-08-03 ENCOUNTER — Encounter (HOSPITAL_COMMUNITY): Payer: Self-pay | Admitting: Orthopedic Surgery

## 2022-08-03 ENCOUNTER — Encounter: Payer: Self-pay | Admitting: Orthopedic Surgery

## 2022-08-03 ENCOUNTER — Encounter (HOSPITAL_COMMUNITY)
Admission: RE | Disposition: A | Discharge status: Home / Self Care | Payer: Self-pay | Source: Home / Self Care | Attending: Orthopedic Surgery

## 2022-08-03 ENCOUNTER — Ambulatory Visit (HOSPITAL_COMMUNITY): Payer: 59 | Admitting: Certified Registered Nurse Anesthetist

## 2022-08-03 DIAGNOSIS — M1990 Unspecified osteoarthritis, unspecified site: Secondary | ICD-10-CM | POA: Diagnosis not present

## 2022-08-03 DIAGNOSIS — G8918 Other acute postprocedural pain: Secondary | ICD-10-CM | POA: Diagnosis not present

## 2022-08-03 DIAGNOSIS — I252 Old myocardial infarction: Secondary | ICD-10-CM | POA: Insufficient documentation

## 2022-08-03 DIAGNOSIS — K219 Gastro-esophageal reflux disease without esophagitis: Secondary | ICD-10-CM | POA: Insufficient documentation

## 2022-08-03 DIAGNOSIS — D649 Anemia, unspecified: Secondary | ICD-10-CM | POA: Insufficient documentation

## 2022-08-03 DIAGNOSIS — Z951 Presence of aortocoronary bypass graft: Secondary | ICD-10-CM | POA: Insufficient documentation

## 2022-08-03 DIAGNOSIS — I10 Essential (primary) hypertension: Secondary | ICD-10-CM

## 2022-08-03 DIAGNOSIS — I2510 Atherosclerotic heart disease of native coronary artery without angina pectoris: Secondary | ICD-10-CM | POA: Insufficient documentation

## 2022-08-03 DIAGNOSIS — T8781 Dehiscence of amputation stump: Secondary | ICD-10-CM | POA: Diagnosis not present

## 2022-08-03 DIAGNOSIS — Z89512 Acquired absence of left leg below knee: Secondary | ICD-10-CM

## 2022-08-03 DIAGNOSIS — E119 Type 2 diabetes mellitus without complications: Secondary | ICD-10-CM | POA: Insufficient documentation

## 2022-08-03 DIAGNOSIS — X58XXXA Exposure to other specified factors, initial encounter: Secondary | ICD-10-CM | POA: Diagnosis not present

## 2022-08-03 DIAGNOSIS — G4730 Sleep apnea, unspecified: Secondary | ICD-10-CM | POA: Insufficient documentation

## 2022-08-03 HISTORY — PX: STUMP REVISION: SHX6102

## 2022-08-03 HISTORY — DX: Sleep apnea, unspecified: G47.30

## 2022-08-03 LAB — GLUCOSE, CAPILLARY
Glucose-Capillary: 111 mg/dL — ABNORMAL HIGH (ref 70–99)
Glucose-Capillary: 107 mg/dL — ABNORMAL HIGH (ref 70–99)
Glucose-Capillary: 122 mg/dL — ABNORMAL HIGH (ref 70–99)

## 2022-08-03 SURGERY — REVISION, AMPUTATION SITE
Anesthesia: General | Site: Knee | Laterality: Left

## 2022-08-03 MED ORDER — 0.9 % SODIUM CHLORIDE (POUR BTL) OPTIME
TOPICAL | Status: DC | PRN
  Administered 2022-08-03: 1000 mL

## 2022-08-03 MED ORDER — ONDANSETRON HCL 4 MG/2ML IJ SOLN
INTRAMUSCULAR | Status: AC
  Filled 2022-08-03: qty 12

## 2022-08-03 MED ORDER — CEFAZOLIN SODIUM-DEXTROSE 2-4 GM/100ML-% IV SOLN
2.0000 g | INTRAVENOUS | Status: AC
  Administered 2022-08-03: 2 g via INTRAVENOUS
  Filled 2022-08-03: qty 100

## 2022-08-03 MED ORDER — ORAL CARE MOUTH RINSE: 15.0000 mL | Freq: Once | OROMUCOSAL | Status: AC

## 2022-08-03 MED ORDER — OXYCODONE HCL 5 MG/5ML PO SOLN: 5.0000 mg | Freq: Once | ORAL | Status: AC | PRN

## 2022-08-03 MED ORDER — MIDAZOLAM HCL 2 MG/2ML IJ SOLN
INTRAMUSCULAR | Status: DC | PRN
  Administered 2022-08-03: 2 mg via INTRAVENOUS

## 2022-08-03 MED ORDER — DEXAMETHASONE SODIUM PHOSPHATE 10 MG/ML IJ SOLN
INTRAMUSCULAR | Status: DC | PRN
  Administered 2022-08-03: 10 mg via INTRAVENOUS

## 2022-08-03 MED ORDER — PHENYLEPHRINE 80 MCG/ML (10ML) SYRINGE FOR IV PUSH (FOR BLOOD PRESSURE SUPPORT)
PREFILLED_SYRINGE | INTRAVENOUS | Status: DC | PRN
  Administered 2022-08-03 (×2): 240 ug via INTRAVENOUS
  Administered 2022-08-03 (×2): 160 ug via INTRAVENOUS

## 2022-08-03 MED ORDER — LIDOCAINE 2% (20 MG/ML) 5 ML SYRINGE
INTRAMUSCULAR | Status: DC | PRN
  Administered 2022-08-03: 60 mg via INTRAVENOUS

## 2022-08-03 MED ORDER — INSULIN ASPART 100 UNIT/ML IJ SOLN: 0.0000 [IU] | INTRAMUSCULAR | Status: DC | PRN

## 2022-08-03 MED ORDER — OXYCODONE HCL 5 MG PO TABS
ORAL_TABLET | ORAL | Status: AC
  Filled 2022-08-03: qty 1

## 2022-08-03 MED ORDER — ACETAMINOPHEN 10 MG/ML IV SOLN
INTRAVENOUS | Status: AC
  Filled 2022-08-03: qty 100

## 2022-08-03 MED ORDER — PROPOFOL 10 MG/ML IV BOLUS
INTRAVENOUS | Status: DC | PRN
  Administered 2022-08-03: 160 mg via INTRAVENOUS

## 2022-08-03 MED ORDER — ACETAMINOPHEN 10 MG/ML IV SOLN
1000.0000 mg | Freq: Once | INTRAVENOUS | Status: DC | PRN
  Administered 2022-08-03: 1000 mg via INTRAVENOUS

## 2022-08-03 MED ORDER — ACETAMINOPHEN 500 MG PO TABS: 1000.0000 mg | ORAL_TABLET | Freq: Once | ORAL | Status: DC | PRN

## 2022-08-03 MED ORDER — KETOROLAC TROMETHAMINE 30 MG/ML IJ SOLN
INTRAMUSCULAR | Status: AC
  Filled 2022-08-03: qty 1

## 2022-08-03 MED ORDER — ONDANSETRON HCL 4 MG/2ML IJ SOLN
INTRAMUSCULAR | Status: DC | PRN
  Administered 2022-08-03: 4 mg via INTRAVENOUS

## 2022-08-03 MED ORDER — FENTANYL CITRATE (PF) 250 MCG/5ML IJ SOLN
INTRAMUSCULAR | Status: DC | PRN
  Administered 2022-08-03 (×2): 50 ug via INTRAVENOUS

## 2022-08-03 MED ORDER — CHLORHEXIDINE GLUCONATE 0.12 % MT SOLN
15.0000 mL | Freq: Once | OROMUCOSAL | Status: AC
  Administered 2022-08-03: 15 mL via OROMUCOSAL
  Filled 2022-08-03: qty 15

## 2022-08-03 MED ORDER — BUPIVACAINE-EPINEPHRINE (PF) 0.5% -1:200000 IJ SOLN
INTRAMUSCULAR | Status: DC | PRN
  Administered 2022-08-03: 25 mL via PERINEURAL

## 2022-08-03 MED ORDER — ACETAMINOPHEN 160 MG/5ML PO SOLN: 1000.0000 mg | Freq: Once | ORAL | Status: DC | PRN

## 2022-08-03 MED ORDER — FENTANYL CITRATE (PF) 100 MCG/2ML IJ SOLN
INTRAMUSCULAR | Status: AC
  Filled 2022-08-03: qty 2

## 2022-08-03 MED ORDER — TRANEXAMIC ACID-NACL 1000-0.7 MG/100ML-% IV SOLN
1000.0000 mg | INTRAVENOUS | Status: AC
  Administered 2022-08-03: 1000 mg via INTRAVENOUS
  Filled 2022-08-03: qty 100

## 2022-08-03 MED ORDER — DEXMEDETOMIDINE HCL IN NACL 80 MCG/20ML IV SOLN
INTRAVENOUS | Status: AC
  Filled 2022-08-03: qty 20

## 2022-08-03 MED ORDER — EPHEDRINE SULFATE-NACL 50-0.9 MG/10ML-% IV SOSY
PREFILLED_SYRINGE | INTRAVENOUS | Status: DC | PRN
  Administered 2022-08-03: 5 mg via INTRAVENOUS

## 2022-08-03 MED ORDER — OXYCODONE HCL 5 MG PO TABS
5.0000 mg | ORAL_TABLET | Freq: Once | ORAL | Status: AC | PRN
  Administered 2022-08-03: 5 mg via ORAL

## 2022-08-03 MED ORDER — DEXAMETHASONE SODIUM PHOSPHATE 10 MG/ML IJ SOLN
INTRAMUSCULAR | Status: AC
  Filled 2022-08-03: qty 6

## 2022-08-03 MED ORDER — PROPOFOL 10 MG/ML IV BOLUS
INTRAVENOUS | Status: AC
  Filled 2022-08-03: qty 20

## 2022-08-03 MED ORDER — MIDAZOLAM HCL 2 MG/2ML IJ SOLN
INTRAMUSCULAR | Status: AC
  Filled 2022-08-03: qty 2

## 2022-08-03 MED ORDER — LACTATED RINGERS IV SOLN: INTRAVENOUS | Status: DC

## 2022-08-03 MED ORDER — PHENYLEPHRINE 80 MCG/ML (10ML) SYRINGE FOR IV PUSH (FOR BLOOD PRESSURE SUPPORT)
PREFILLED_SYRINGE | INTRAVENOUS | Status: AC
  Filled 2022-08-03: qty 30

## 2022-08-03 MED ORDER — FENTANYL CITRATE (PF) 100 MCG/2ML IJ SOLN
25.0000 ug | INTRAMUSCULAR | Status: DC | PRN
  Administered 2022-08-03 (×2): 50 ug via INTRAVENOUS

## 2022-08-03 MED ORDER — LIDOCAINE 2% (20 MG/ML) 5 ML SYRINGE
INTRAMUSCULAR | Status: AC
  Filled 2022-08-03: qty 20

## 2022-08-03 MED ORDER — FENTANYL CITRATE (PF) 250 MCG/5ML IJ SOLN
INTRAMUSCULAR | Status: AC
  Filled 2022-08-03: qty 5

## 2022-08-03 MED ORDER — LIDOCAINE-EPINEPHRINE (PF) 1.5 %-1:200000 IJ SOLN
INTRAMUSCULAR | Status: DC | PRN
  Administered 2022-08-03: 5 mL via PERINEURAL

## 2022-08-03 SURGICAL SUPPLY — 34 items
BAG COUNTER SPONGE SURGICOUNT (BAG) ×1 IMPLANT
BLADE SAW RECIP 87.9 MT (BLADE) IMPLANT
BLADE SURG 21 STRL SS (BLADE) ×1 IMPLANT
BNDG COHESIVE 4X5 TAN STRL LF (GAUZE/BANDAGES/DRESSINGS) IMPLANT
CANISTER WOUND CARE 500ML ATS (WOUND CARE) ×1 IMPLANT
COVER SURGICAL LIGHT HANDLE (MISCELLANEOUS) ×1 IMPLANT
DRAPE DERMATAC (DRAPES) IMPLANT
DRAPE EXTREMITY T 121X128X90 (DISPOSABLE) ×1 IMPLANT
DRAPE HALF SHEET 40X57 (DRAPES) ×1 IMPLANT
DRAPE INCISE IOBAN 66X45 STRL (DRAPES) ×1 IMPLANT
DRAPE U-SHAPE 47X51 STRL (DRAPES) ×2 IMPLANT
DRESSING PREVENA PLUS CUSTOM (GAUZE/BANDAGES/DRESSINGS) ×1 IMPLANT
DRSG PREVENA PLUS CUSTOM (GAUZE/BANDAGES/DRESSINGS) ×1
DURAPREP 26ML APPLICATOR (WOUND CARE) ×1 IMPLANT
ELECT REM PT RETURN 9FT ADLT (ELECTROSURGICAL) ×1
ELECTRODE REM PT RTRN 9FT ADLT (ELECTROSURGICAL) ×1 IMPLANT
GLOVE BIOGEL PI IND STRL 9 (GLOVE) ×1 IMPLANT
GLOVE SURG ORTHO 9.0 STRL STRW (GLOVE) ×1 IMPLANT
GOWN STRL REUS W/ TWL XL LVL3 (GOWN DISPOSABLE) ×2 IMPLANT
GOWN STRL REUS W/TWL XL LVL3 (GOWN DISPOSABLE) ×2
GRAFT SKIN WND MICRO 38 (Tissue) IMPLANT
KIT BASIN OR (CUSTOM PROCEDURE TRAY) ×1 IMPLANT
KIT TURNOVER KIT B (KITS) ×1 IMPLANT
MANIFOLD NEPTUNE II (INSTRUMENTS) ×1 IMPLANT
NS IRRIG 1000ML POUR BTL (IV SOLUTION) ×1 IMPLANT
PACK GENERAL/GYN (CUSTOM PROCEDURE TRAY) ×1 IMPLANT
PAD ARMBOARD 7.5X6 YLW CONV (MISCELLANEOUS) ×1 IMPLANT
PREVENA RESTOR ARTHOFORM 46X30 (CANNISTER) ×1 IMPLANT
PREVENA RESTOR AXIOFORM 29X28 (GAUZE/BANDAGES/DRESSINGS) IMPLANT
STAPLER VISISTAT 35W (STAPLE) IMPLANT
SUT ETHILON 2 0 PSLX (SUTURE) ×2 IMPLANT
SUT SILK 2 0 (SUTURE)
SUT SILK 2-0 18XBRD TIE 12 (SUTURE) IMPLANT
TOWEL GREEN STERILE (TOWEL DISPOSABLE) ×1 IMPLANT

## 2022-08-03 NOTE — Progress Notes (Signed)
Orthopedic Tech Progress Note Patient Details:  [name] September 03, 1960 [id number]  Patient ID: Joshua Branch, male   DOB: June 01, 1961, 62 y.o.   MRN: [id number] Order placed with [facility] for shrinker. Vernona Rieger 08/03/2022, 1:07 PM

## 2022-08-03 NOTE — Progress Notes (Signed)
Office Visit Note   [name]           Date of Birth: [birth year]           MRN: [id number] Visit Date: 08/02/2022              Requested by: Mackie Pai, PA-C [place],  [place] [address] PCP: Mackie Pai, PA-C  Chief Complaint  Patient presents with   Left Leg - Routine Post Op    07/06/2022 revision left BKA       HPI: Patient is a 62 year old gentleman who presents in follow-up he is 1 month status post revision left transtibial amputation.  Patient states that he has had 1 fall he uses a kneeling scooter and he states the wound is splitting open.  Assessment & Plan: Visit Diagnoses:  1. Dehiscence of amputation stump of left lower extremity ([name]   2. Hx of BKA, left ([place])     Plan: Will plan for surgical revision of the transtibial amputation.  A prescription is sent to [facility] for Dilaudid.  Risks and benefits of surgery were discussed patient states he understands and wishes to proceed with outpatient surgery.  Follow-Up Instructions: Return in about 2 weeks (around 08/16/2022).   Ortho Exam  Patient is alert, oriented, no adenopathy, well-dressed, normal affect, normal respiratory effort. Examination there is no ascending cellulitis.  Medially the wound has healed well distally and laterally there is significant dehiscence of the wound.  No purulent drainage.  Imaging: No results found.   Labs: Lab Results  Component Value Date   HGBA1C 6.2 (H) 01/04/2022   HGBA1C 7.3 (A) 06/14/2021   HGBA1C 7.4 (A) 01/27/2021   ESRSEDRATE 36 (H) 02/12/2022   REPTSTATUS 07/11/2022 FINAL 07/06/2022   GRAMSTAIN  07/06/2022    RARE GRAM POSITIVE COCCI MODERATE WBC PRESENT, PREDOMINANTLY PMN    CULT  07/06/2022    FEW STAPHYLOCOCCUS AUREUS NO ANAEROBES ISOLATED Performed at [facility], [place] [address], [place], [place] [address]    LABORGA STAPHYLOCOCCUS AUREUS 07/06/2022     Lab Results  Component Value Date    ALBUMIN 3.3 (L) 07/06/2022   ALBUMIN 3.8 02/12/2022   ALBUMIN 4.3 05/01/2021   PREALBUMIN 17 (L) 07/06/2022    Lab Results  Component Value Date   MG 2.1 05/08/2019   MG 2.2 06/26/2018   MG 3.0 (H) 06/26/2018   Lab Results  Component Value Date   VD25OH 30.56 07/06/2022    Lab Results  Component Value Date   PREALBUMIN 17 (L) 07/06/2022      Latest Ref Rng & Units 07/06/2022   12:28 PM 05/25/2022    6:38 AM 02/12/2022    3:04 PM  CBC EXTENDED  WBC 4.0 - 10.5 K/uL 4.4  6.3  9.0   RBC 4.22 - 5.81 MIL/uL 4.18  3.92  3.97   Hemoglobin 13.0 - 17.0 g/dL 11.0  10.7  12.0   HCT 39.0 - 52.0 % 36.7  33.4  36.4   Platelets 150 - 400 K/uL 345  371  325.0   NEUT# 1.7 - 7.7 K/uL 1.9   5.9   Lymph# 0.7 - 4.0 K/uL 1.1   2.1      There is no height or weight on file to calculate BMI.  Orders:  No orders of the defined types were placed in this encounter.  Meds ordered this encounter  Medications   HYDROmorphone (DILAUDID) 2 MG tablet  Sig: Take 1 tablet (2 mg total) by mouth every 6 (six) hours as needed for severe pain.    Dispense:  15 tablet    Refill:  0     Procedures: No procedures performed  Clinical Data: No additional findings.  ROS:  All other systems negative, except as noted in the HPI. Review of Systems  Objective: Vital Signs: There were no vitals taken for this visit.  Specialty Comments:  No specialty comments available.  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   Vertigo 08/20/2016   Myringotomy tube status 08/20/2016   Ear fullness, left 08/20/2016   Asymmetrical left sensorineural hearing loss 02/06/2016   Neuropathy of left foot 02/03/2015   Charcot's joint of left foot, non-diabetic 02/03/2015   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   Cramping of hands 06/02/2014   Eustachian tube dysfunction 06/02/2014   HTN (hypertension) 05/07/2014   Obstructive sleep apnea 05/07/2014   Past Medical History:  Diagnosis Date   Acute myocardial infarction ([place]) 06/22/2018   Allergy    Anemia    iron deficiency   Charcot's joint of left foot, non-diabetic    Coronary artery disease involving native coronary artery with unstable angina pectoris ([place]) 06/22/2018   Diabetes ([place])    type 2   GERD (gastroesophageal reflux disease)    History of kidney stones    passed   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 ([place]) 01/18/2021   Hyperlipidemia    Hypertension    Neuropathy of left foot    Nodular basal cell carcinoma (BCC) 07/10/2019   Right V of Neck (curet and 5FU)   Osteomyelitis of third toe of left foot ([place])    Pneumonia    As a child   Right foot ulcer ([place])    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   LIMA to LAD, SVG to Diag, SVG to OM2, SVG to [name], EVH via right thigh and leg   Sleep apnea    Subacute osteomyelitis, left ankle and foot ([place]) 05/25/2022   Type II diabetes mellitus with complication, uncontrolled    Uncontrolled type 2 diabetes mellitus     Family History  Problem Relation Age of Onset   Diabetes Mother    Stroke Father    Hypertension Father    Hearing loss Father    Colon polyps Father    Colon cancer Neg Hx    Esophageal cancer Neg Hx    Prostate cancer Neg Hx     Past Surgical History:  Procedure Laterality Date   AMPUTATION Right 01/06/2020   Procedure: RIGHT FOOT 2ND RAY AMPUTATION;  Surgeon: Newt Minion,  MD;  Location: [place];  Service: Orthopedics;  Laterality: Right;   AMPUTATION Left 01/11/2021   Procedure: LEFT FOOT 3RD RAY AMPUTATION;  Surgeon: Newt Minion, MD;  Location: [place];  Service: Orthopedics;  Laterality: Left;   AMPUTATION Left 12/22/2021  Procedure: LEFT TRANSMETATARSAL AMPUTATION;  Surgeon: Newt Minion, MD;  Location: [place];  Service: Orthopedics;  Laterality: Left;   AMPUTATION Left 05/25/2022   Procedure: LEFT FOOT CHOPART;  Surgeon: Newt Minion, MD;  Location: [place];  Service: Orthopedics;  Laterality: Left;   AMPUTATION Left 06/15/2022   Procedure: LEFT BELOW KNEE AMPUTATION;  Surgeon: Newt Minion, MD;  Location: [place];  Service: Orthopedics;  Laterality: Left;   APPLICATION OF WOUND VAC  12/22/2021   Procedure: APPLICATION OF WOUND VAC;  Surgeon: Newt Minion, MD;  Location: [place];  Service: Orthopedics;;   APPLICATION OF WOUND VAC Left 05/25/2022   Procedure: APPLICATION OF WOUND VAC;  Surgeon: Newt Minion, MD;  Location: [place];  Service: Orthopedics;  Laterality: Left;   COLONOSCOPY W/ POLYPECTOMY     CORONARY ARTERY BYPASS GRAFT N/A 06/25/2018   Procedure: CORONARY ARTERY BYPASS GRAFTING (CABG) TIMES FOUR USING LEFT INTERNAL MAMMARY ARTERY AND RIGHT ENDOSCOPICALLY HARVESTED SAPHENOUS VEIN;  Surgeon: Rexene Alberts, MD;  Location: [place];  Service: Open Heart Surgery;  Laterality: N/A;   CYSTOSCOPY WITH STENT PLACEMENT Left 12/04/2021   Procedure: CYSTOSCOPY/RETROGRADE/ STENT PLACEMENT;  Surgeon: Vira Agar, MD;  [facility];  Service: Urology;  Laterality: Left;   CYSTOSCOPY/URETEROSCOPY/HOLMIUM LASER/STENT PLACEMENT Left 01/05/2022   Procedure: CYSTOSCOPY LEFT URETERAL STENT REMOVAL  LEFT RETROGRADE PYELOGRAM URETEROSCOPY BASKET STONE EXTRACTION STENT PLACEMENT;  Surgeon: Lucas Mallow, MD;  [facility];  Service: Urology;  Laterality: Left;  1 HR FOR CASE   ENDOVEIN HARVEST OF GREATER SAPHENOUS VEIN Right 06/25/2018   Procedure:  ENDOVEIN HARVEST OF GREATER SAPHENOUS VEIN;  Surgeon: Rexene Alberts, MD;  Location: [place];  Service: Open Heart Surgery;  Laterality: Right;   EXTRACORPOREAL SHOCK WAVE LITHOTRIPSY Left 12/07/2021   Procedure: EXTRACORPOREAL SHOCK WAVE LITHOTRIPSY (ESWL);  Surgeon: Festus Aloe, MD;  Location: [facility];  Service: Urology;  Laterality: Left;   LEFT HEART CATH AND CORONARY ANGIOGRAPHY N/A 06/22/2018   Procedure: LEFT HEART CATH AND CORONARY ANGIOGRAPHY;  Surgeon: Nigel Mormon, MD;  Location: [place] [facility];  Service: Cardiovascular;  Laterality: N/A;   SKIN SPLIT GRAFT Left 02/28/2022   Procedure: LEFT FOOT SKIN GRAFT;  Surgeon: Newt Minion, MD;  Location: [place];  Service: Orthopedics;  Laterality: Left;   STUMP REVISION Left 07/06/2022   Procedure: REVISION LEFT BELOW KNEE AMPUTATION  WITH VAC PLACEMENT;  Surgeon: Newt Minion, MD;  Location: [place];  Service: Orthopedics;  Laterality: Left;   surgical debridement  Right    Right foot ulcer   TEE WITHOUT CARDIOVERSION N/A 06/25/2018   Procedure: TRANSESOPHAGEAL ECHOCARDIOGRAM (TEE);  Surgeon: Rexene Alberts, MD;  Location: [place];  Service: Open Heart Surgery;  Laterality: N/A;   Social History   Occupational History   Occupation: attorney  Tobacco Use   Smoking status: Never   Smokeless tobacco: Never  Vaping Use   Vaping Use: Never used  Substance and Sexual Activity   Alcohol use: Yes    Comment: maybe 2 beers a month   Drug use: Never   Sexual activity: Not on file

## 2022-08-03 NOTE — Anesthesia Procedure Notes (Signed)
Procedure Name: LMA Insertion Date/Time: 08/03/2022 12:11 PM  Performed by: Gaylene Brooks, CRNAPre-anesthesia Checklist: Patient identified, Emergency Drugs available, Suction available and Patient being monitored Patient Re-evaluated:Patient Re-evaluated prior to induction Oxygen Delivery Method: Circle system utilized Preoxygenation: Pre-oxygenation with 100% oxygen Induction Type: IV induction Ventilation: Mask ventilation without difficulty LMA: LMA inserted LMA Size: 5.0 Number of attempts: 1 Placement Confirmation: positive ETCO2, breath sounds checked- equal and bilateral and CO2 detector Tube secured with: Tape Dental Injury: Teeth and Oropharynx as per pre-operative assessment

## 2022-08-03 NOTE — Anesthesia Preprocedure Evaluation (Signed)
Anesthesia Evaluation  Patient identified by MRN, date of birth, ID band Patient awake    Reviewed: Allergy & Precautions, NPO status , Patient's Chart, lab work & pertinent test results  History of Anesthesia Complications Negative for: history of anesthetic complications  Airway Mallampati: III  TM Distance: >3 FB Neck ROM: Full    Dental  (+) Dental Advisory Given, Teeth Intact   Pulmonary neg shortness of breath, sleep apnea , neg COPD   breath sounds clear to auscultation       Cardiovascular hypertension, Pt. on medications and Pt. on home beta blockers (-) angina + CAD and + Past MI   Rhythm:Regular  Left Ventricle Normal cavity size and wall thickness. LV systolic function is low normal with an EF of 50-55%.  Wall motion is abnormal. Inferolateral wall motion is hypokinetic. No thrombus present. No mass present. Septum Normal atrial and ventricular septum with no septal defect and normal septal motion. No Patent Foramen Ovale present. Left Atrium Left atrium normal in structure and function. No thrombus present. No left atrial appendage.  No mass present. No ASD or PFO closure device in interatrial septum. Aortic Valve The valve is trileaflet. Mild valve calcification present. Normal leaflet separation. No stenosis. Trace regurgitation. No AV vegetation. Aorta No aneurysm present. No plaque present.  No graft present. No significant coarctation present.  No aortic dissection present Mitral Valve Normal valve structure. No leaflet thickening and calcification present. Normal transmitral gradient. No stenosis. Normal leaflet mobility. Trace regurgitation. The jet direction is centrally-directed. No prolapse present. No vegetation present. No abscess present. No flail present. Right Ventricle Normal cavity size, wall thickness and ejection fraction. No thrombus present. No mass present. Right Atrium Normal right atrial  size. Tricuspid Valve Normal tricuspid valve structure. No prosthetic valve present.  No stenosis. No regurgitation. Pulmonic Valve Normal pulmonic valve structure. No prosthetic valve present. No stenosis. No regurgitation. No vegetation.     Neuro/Psych  Neuromuscular disease  negative psych ROS   GI/Hepatic Neg liver ROS,GERD  ,,  Endo/Other  diabetes    Renal/GU negative Renal ROSLab Results      Component                Value               Date                      CREATININE               1.08                07/06/2022                Musculoskeletal  (+) Arthritis ,    Abdominal   Peds  Hematology  (+) Blood dyscrasia, anemia Lab Results      Component                Value               Date                      WBC                      4.4                 07/06/2022  HGB                      11.0 (L)            07/06/2022                HCT                      36.7 (L)            07/06/2022                MCV                      87.8                07/06/2022                PLT                      345                 07/06/2022              Anesthesia Other Findings   Reproductive/Obstetrics                             Anesthesia Physical Anesthesia Plan  ASA: 3  Anesthesia Plan: General   Post-op Pain Management: Ofirmev IV (intra-op)*   Induction: Intravenous  PONV Risk Score and Plan: 2 and Ondansetron and Dexamethasone  Airway Management Planned: LMA  Additional Equipment: None  Intra-op Plan:   Post-operative Plan: Extubation in OR  Informed Consent: I have reviewed the patients History and Physical, chart, labs and discussed the procedure including the risks, benefits and alternatives for the proposed anesthesia with the patient or authorized representative who has indicated his/her understanding and acceptance.     Dental advisory given  Plan Discussed with: CRNA  Anesthesia Plan Comments:         Anesthesia Quick Evaluation

## 2022-08-03 NOTE — Transfer of Care (Signed)
Immediate Anesthesia Transfer of Care Note  [name]  Procedure(s) Performed: REVISION LEFT BELOW KNEE AMPUTATION (Left: Knee)  Patient Location: PACU  Anesthesia Type:General  Level of Consciousness: drowsy and patient cooperative  Airway & Oxygen Therapy: Patient Spontanous Breathing and Patient connected to nasal cannula oxygen  Post-op Assessment: Report given to RN, Post -op Vital signs reviewed and stable, and Patient moving all extremities X 4  Post vital signs: Reviewed and stable  Last Vitals:  Vitals Value Taken Time  BP 117/73 08/03/22 1300  Temp 36.4 C 08/03/22 1300  Pulse 79 08/03/22 1305  Resp 13 08/03/22 1305  SpO2 99 % 08/03/22 1305  Vitals shown include unvalidated device data.  Last Pain:  Vitals:   08/03/22 1300  TempSrc:   PainSc: 2          Complications: No notable events documented.

## 2022-08-03 NOTE — Interval H&P Note (Signed)
History and Physical Interval Note:  08/03/2022 10:18 AM  Joshua Branch  has presented today for surgery, with the diagnosis of Dehiscence Left Below Knee Amputation.  The various methods of treatment have been discussed with the patient and family. After consideration of risks, benefits and other options for treatment, the patient has consented to  Procedure(s): REVISION LEFT BELOW KNEE AMPUTATION (Left) as a surgical intervention.  [name] history has been reviewed, patient examined, no change in status, stable for surgery.  I have reviewed the patient's chart and labs.  Questions were answered to the patient's satisfaction.     Newt Minion

## 2022-08-03 NOTE — Anesthesia Procedure Notes (Signed)
Anesthesia Regional Block: Popliteal block   Pre-Anesthetic Checklist: , timeout performed,  Correct Patient, Correct Site, Correct Laterality,  Correct Procedure, Correct Position, site marked,  Risks and benefits discussed,  Surgical consent,  Pre-op evaluation,  At surgeon's request and post-op pain management  Laterality: Left and Lower  Prep: chloraprep       Needles:  Injection technique: Single-shot      Needle Length: 9cm  Needle Gauge: 22     Additional Needles: Arrow StimuQuik ECHO Echogenic Stimulating PNB Needle  Procedures:,,,, ultrasound used (permanent image in chart),,    Narrative:  Start time: 08/03/2022 1:46 PM End time: 08/03/2022 1:51 PM Injection made incrementally with aspirations every 5 mL.  Performed by: Personally  Anesthesiologist: Oleta Mouse, MD

## 2022-08-03 NOTE — Op Note (Signed)
08/03/2022  1:05 PM  PATIENT:  Joshua Branch    PRE-OPERATIVE DIAGNOSIS:  Dehiscence Left Below Knee Amputation  POST-OPERATIVE DIAGNOSIS:  Same  PROCEDURE:  REVISION LEFT BELOW KNEE AMPUTATION Application of Kerecis micro graft 38 cm. Application of customizable and Arnell Sieving form wound VAC.  SURGEON:  Newt Minion, MD  PHYSICIAN ASSISTANT:None ANESTHESIA:   General  PREOPERATIVE INDICATIONS:  [name] is a  62 y.o. male with a diagnosis of Dehiscence Left Below Knee Amputation who failed conservative measures and elected for surgical management.    The risks benefits and alternatives were discussed with the patient preoperatively including but not limited to the risks of infection, bleeding, nerve injury, cardiopulmonary complications, the need for revision surgery, among others, and the patient was willing to proceed.  OPERATIVE IMPLANTS: Kerecis micro graft 38 cm.  '@ENCIMAGES'$ @  OPERATIVE FINDINGS: Good petechial bleeding tissue after revision.  Tissue sent for cultures.  OPERATIVE PROCEDURE: Patient was brought the operating room and underwent a general anesthetic.  After adequate levels anesthesia were obtained patient's left lower extremity was prepped using DuraPrep draped into a sterile field a timeout was called.  Elliptical incision was made around the dehisced wound this was carried sharply down to bone.  The distal centimeter of tibia and fibula were resected beveled anteriorly.  The vascular bundles and suture-ligated with 2-0 silk.  Electrocautery was used for further hemostasis.  The wound was irrigated with normal saline.  Tissue was sent for cultures.  The wound was irrigated with normal saline.  38 cm of Kerecis micro graft was applied to cover the wound surface area 200 cm.  The incision was closed using 2-0 nylon incorporating deep and superficial fascial layers and skin.  The wound approximated well.  The [name] customizable and Arnell Sieving form wound VAC was  applied this had a good suction fit.  Patient was extubated taken to PACU in stable condition.  The vive socket and hanger limb protector were applied.   DISCHARGE PLANNING:  Antibiotic duration: Preoperative antibiotics will start postoperative antibiotics pending culture sensitivities.  Weightbearing: Nonweightbearing on the left  Pain medication: Prescription sent for Dilaudid yesterday  Dressing care/ Wound VAC: Wound VAC for 1 week.  Ambulatory devices: Kneeling scooter  Discharge to: Home.  Follow-up: In the office 1 week post operative.

## 2022-08-04 ENCOUNTER — Other Ambulatory Visit: Payer: Self-pay | Admitting: Orthopedic Surgery

## 2022-08-05 ENCOUNTER — Encounter (HOSPITAL_COMMUNITY): Payer: Self-pay | Admitting: Orthopedic Surgery

## 2022-08-06 ENCOUNTER — Other Ambulatory Visit (HOSPITAL_COMMUNITY): Payer: Self-pay

## 2022-08-06 ENCOUNTER — Telehealth: Payer: Self-pay | Admitting: Orthopedic Surgery

## 2022-08-06 ENCOUNTER — Other Ambulatory Visit: Payer: Self-pay | Admitting: Orthopedic Surgery

## 2022-08-06 ENCOUNTER — Encounter: Payer: Self-pay | Admitting: Orthopedic Surgery

## 2022-08-06 ENCOUNTER — Other Ambulatory Visit: Payer: Self-pay | Admitting: Medical

## 2022-08-06 ENCOUNTER — Other Ambulatory Visit (HOSPITAL_BASED_OUTPATIENT_CLINIC_OR_DEPARTMENT_OTHER): Payer: Self-pay

## 2022-08-06 ENCOUNTER — Telehealth: Payer: Self-pay

## 2022-08-06 ENCOUNTER — Ambulatory Visit (INDEPENDENT_AMBULATORY_CARE_PROVIDER_SITE_OTHER): Payer: 59 | Admitting: Orthopedic Surgery

## 2022-08-06 DIAGNOSIS — T8781 Dehiscence of amputation stump: Secondary | ICD-10-CM

## 2022-08-06 DIAGNOSIS — Z89512 Acquired absence of left leg below knee: Secondary | ICD-10-CM

## 2022-08-06 MED ORDER — ONDANSETRON HCL 4 MG PO TABS
4.0000 mg | ORAL_TABLET | Freq: Three times a day (TID) | ORAL | 0 refills | Status: AC | PRN
  Filled 2022-08-06: qty 20, 7d supply, fill #0

## 2022-08-06 MED ORDER — DOXYCYCLINE HYCLATE 100 MG PO TABS
100.0000 mg | ORAL_TABLET | Freq: Two times a day (BID) | ORAL | 0 refills | Status: DC
  Filled 2022-08-06: qty 60, 30d supply, fill #0

## 2022-08-06 NOTE — Telephone Encounter (Signed)
Per Dr. Sharol Given called pt to find out if he is still taking his Bactrim DS and how many days he has left. I tried to call pt and mailbox is full. Will hold and try to reach pt again.

## 2022-08-06 NOTE — Telephone Encounter (Signed)
Newt Minion, MD  Forrest, Autumn L, RMA Patient's cultures are positive for MRSA.  This is very sensitive to the doxycycline.  A prescription sent to [facility].

## 2022-08-06 NOTE — Telephone Encounter (Signed)
Per Cultures and Dr. Sharol Given, they grew MRSA. Sensitive to doxycycline. He sent in to [facility]. Pt is here today for an appt.

## 2022-08-06 NOTE — Telephone Encounter (Signed)
Sorry, didn't mean to tag onto this message. Disregard!

## 2022-08-06 NOTE — Telephone Encounter (Signed)
Pt coming in today for appt at 3:30. Confirmed that he is on bactrim DS and will discuss cultures at appt today. Dr. Sharol Given is aware.

## 2022-08-06 NOTE — Anesthesia Postprocedure Evaluation (Signed)
Anesthesia Post Note  [name]  Procedure(s) Performed: REVISION LEFT BELOW KNEE AMPUTATION (Left: Knee)     Patient location during evaluation: PACU Anesthesia Type: General and Regional Level of consciousness: awake and alert Pain management: pain level controlled Vital Signs Assessment: post-procedure vital signs reviewed and stable Respiratory status: spontaneous breathing, nonlabored ventilation and respiratory function stable Cardiovascular status: blood pressure returned to baseline and stable Postop Assessment: no apparent nausea or vomiting Anesthetic complications: no  No notable events documented.  Last Vitals:  Vitals:   08/03/22 1345 08/03/22 1356  BP:  104/62  Pulse: 73 76  Resp: 12 14  Temp:  36.4 C  SpO2: 98% 98%    Last Pain:  Vitals:   08/03/22 1356  TempSrc:   PainSc: 0-No pain                 Nneka Blanda

## 2022-08-07 ENCOUNTER — Encounter: Payer: Self-pay | Admitting: Orthopedic Surgery

## 2022-08-07 ENCOUNTER — Other Ambulatory Visit (HOSPITAL_BASED_OUTPATIENT_CLINIC_OR_DEPARTMENT_OTHER): Payer: Self-pay

## 2022-08-07 ENCOUNTER — Other Ambulatory Visit (HOSPITAL_COMMUNITY): Payer: Self-pay

## 2022-08-07 ENCOUNTER — Encounter (HOSPITAL_COMMUNITY): Payer: Self-pay

## 2022-08-07 NOTE — Progress Notes (Signed)
Office Visit Note   [name]           Date of Birth: 1961-07-07           MRN: [id number] Visit Date: 08/06/2022              Requested by: Mackie Pai, PA-C [place] [id number] PCP: Mackie Pai, PA-C  Chief Complaint  Patient presents with   Left Leg - Routine Post Op    08/03/22 revision left BKA      HPI: Patient is a 62 year old gentleman who presents 3 days status post revision left below-knee amputation.  Patient presents with questions regarding the wound VAC pump.  Assessment & Plan: Visit Diagnoses:  1. Dehiscence of amputation stump of left lower extremity ([name]   2. Hx of BKA, left ([place])     Plan: The wound VAC is functioning well with minimal drainage.  The dressing was reinforced with Ioban.  Patient has a prescription for doxycycline and Zofran for nausea.  He is on probiotics.  Follow-Up Instructions: Return in about 1 week (around 08/13/2022).   Ortho Exam  Patient is alert, oriented, no adenopathy, well-dressed, normal affect, normal respiratory effort. Examination there is minimal drainage in the wound VAC canister there is a good suction fit.  The negative pressure wound dressing was reinforced with Ioban.  Patient's cultures have returned showing MRSA that is sensitive to doxycycline and resistant to his sulfamethoxazole trimethoprim.  Imaging: No results found. No images are attached to the encounter.  Labs: Lab Results  Component Value Date   HGBA1C 6.2 (H) 01/04/2022   HGBA1C 7.3 (A) 06/14/2021   HGBA1C 7.4 (A) 01/27/2021   ESRSEDRATE 36 (H) 02/12/2022   REPTSTATUS PENDING 08/03/2022   GRAMSTAIN  08/03/2022    RARE WBC PRESENT, PREDOMINANTLY PMN RARE GRAM POSITIVE COCCI INTRACELLULAR Performed at [facility], [place] [address] [place], [place] [address]    CULT  08/03/2022    FEW METHICILLIN RESISTANT STAPHYLOCOCCUS AUREUS FEW CORYNEBACTERIUM STRIATUM Standardized  susceptibility testing for this organism is not available. NO ANAEROBES ISOLATED; CULTURE IN PROGRESS FOR 5 DAYS    LABORGA METHICILLIN RESISTANT STAPHYLOCOCCUS AUREUS 08/03/2022     Lab Results  Component Value Date   ALBUMIN 3.3 (L) 07/06/2022   ALBUMIN 3.8 02/12/2022   ALBUMIN 4.3 05/01/2021   PREALBUMIN 17 (L) 07/06/2022    Lab Results  Component Value Date   MG 2.1 05/08/2019   MG 2.2 06/26/2018   MG 3.0 (H) 06/26/2018   Lab Results  Component Value Date   VD25OH 30.56 07/06/2022    Lab Results  Component Value Date   PREALBUMIN 17 (L) 07/06/2022      Latest Ref Rng & Units 07/06/2022   12:28 PM 05/25/2022    6:38 AM 02/12/2022    3:04 PM  CBC EXTENDED  WBC 4.0 - 10.5 K/uL 4.4  6.3  9.0   RBC 4.22 - 5.81 MIL/uL 4.18  3.92  3.97   Hemoglobin 13.0 - 17.0 g/dL 11.0  10.7  12.0   HCT 39.0 - 52.0 % 36.7  33.4  36.4   Platelets 150 - 400 K/uL 345  371  325.0   NEUT# 1.7 - 7.7 K/uL 1.9   5.9   Lymph# 0.7 - 4.0 K/uL 1.1   2.1      There is no height or weight on file to calculate BMI.  Orders:  No orders  of the defined types were placed in this encounter.  Meds ordered this encounter  Medications   doxycycline (VIBRA-TABS) 100 MG tablet    Sig: Take 1 tablet (100 mg total) by mouth 2 (two) times daily.    Dispense:  60 tablet    Refill:  0   ondansetron (ZOFRAN) 4 MG tablet    Sig: Take 1 tablet (4 mg total) by mouth every 8 (eight) hours as needed for nausea or vomiting.    Dispense:  20 tablet    Refill:  0     Procedures: No procedures performed  Clinical Data: No additional findings.  ROS:  All other systems negative, except as noted in the HPI. Review of Systems  Objective: Vital Signs: There were no vitals taken for this visit.  Specialty Comments:  No specialty comments available.  PMFS History: Patient Active Problem List   Diagnosis Date Noted   S/P BKA (below knee amputation), left ([place]) 06/15/2022   Dehiscence of amputation  stump of left lower extremity ([place]) 05/25/2022   Poorly controlled type 2 diabetes mellitus with circulatory disorder ([place]) 07/15/2018   Numbness in both hands 07/15/2018   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   Acute coronary syndrome ([place]) 06/22/2018   Coronary artery disease involving native coronary artery with unstable angina pectoris ([place]) 06/22/2018   Acute myocardial infarction ([place]) 06/22/2018   Hyperlipidemia    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   Vertigo 08/20/2016   Myringotomy tube status 08/20/2016   Ear fullness, left 08/20/2016   Asymmetrical left sensorineural hearing loss 02/06/2016   Neuropathy of left foot 02/03/2015   Charcot's joint of left foot, non-diabetic 02/03/2015   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   Cramping of hands 06/02/2014   Eustachian tube dysfunction 06/02/2014   HTN (hypertension) 05/07/2014   Obstructive sleep apnea 05/07/2014   Past Medical History:  Diagnosis Date   Acute myocardial infarction ([place]) 06/22/2018   Allergy    Anemia    iron deficiency   Charcot's joint of left foot, non-diabetic    Coronary artery disease involving native coronary artery with unstable angina pectoris ([place]) 06/22/2018   Diabetes ([place])    type 2   GERD (gastroesophageal reflux disease)    History of kidney stones    passed   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 ([place]) 01/18/2021   Hyperlipidemia    Hypertension    Neuropathy of left foot    Nodular basal cell carcinoma (BCC) 07/10/2019   Right V of Neck (curet and 5FU)   Osteomyelitis of third toe of left foot ([place])    Pneumonia    As a child   Right foot ulcer ([place])    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   LIMA to LAD, SVG to Diag, SVG to OM2, SVG to [name], EVH via right thigh and leg   Sleep apnea    Subacute osteomyelitis, left ankle and foot ([place]) 05/25/2022   Type II diabetes mellitus with complication, uncontrolled    Uncontrolled type 2 diabetes mellitus     Family History   Problem Relation Age of Onset   Diabetes Mother    Stroke Father    Hypertension Father    Hearing loss Father    Colon polyps Father    Colon cancer Neg Hx    Esophageal cancer Neg Hx    Prostate cancer Neg Hx     Past Surgical History:  Procedure Laterality Date  AMPUTATION Right 01/06/2020   Procedure: RIGHT FOOT 2ND RAY AMPUTATION;  Surgeon: Newt Minion, MD;  Location: [facility];  Service: Orthopedics;  Laterality: Right;   AMPUTATION Left 01/11/2021   Procedure: LEFT FOOT 3RD RAY AMPUTATION;  Surgeon: Newt Minion, MD;  Location: [facility];  Service: Orthopedics;  Laterality: Left;   AMPUTATION Left 12/22/2021   Procedure: LEFT TRANSMETATARSAL AMPUTATION;  Surgeon: Newt Minion, MD;  Location: [facility];  Service: Orthopedics;  Laterality: Left;   AMPUTATION Left 05/25/2022   Procedure: LEFT FOOT CHOPART;  Surgeon: Newt Minion, MD;  Location: [facility];  Service: Orthopedics;  Laterality: Left;   AMPUTATION Left 06/15/2022   Procedure: LEFT BELOW KNEE AMPUTATION;  Surgeon: Newt Minion, MD;  Location: [facility];  Service: Orthopedics;  Laterality: Left;   APPLICATION OF WOUND VAC  12/22/2021   Procedure: APPLICATION OF WOUND VAC;  Surgeon: Newt Minion, MD;  Location: [facility];  Service: Orthopedics;;   APPLICATION OF WOUND VAC Left 05/25/2022   Procedure: APPLICATION OF WOUND VAC;  Surgeon: Newt Minion, MD;  Location: [facility];  Service: Orthopedics;  Laterality: Left;   COLONOSCOPY W/ POLYPECTOMY     CORONARY ARTERY BYPASS GRAFT N/A 06/25/2018   Procedure: CORONARY ARTERY BYPASS GRAFTING (CABG) TIMES FOUR USING LEFT INTERNAL MAMMARY ARTERY AND RIGHT ENDOSCOPICALLY HARVESTED SAPHENOUS VEIN;  Surgeon: Rexene Alberts, MD;  Location: [facility];  Service: Open Heart Surgery;  Laterality: N/A;   CYSTOSCOPY WITH STENT PLACEMENT Left 12/04/2021   Procedure: CYSTOSCOPY/RETROGRADE/ STENT PLACEMENT;  Surgeon: Vira Agar, MD;  Location: [facility];  Service: Urology;  Laterality: Left;    CYSTOSCOPY/URETEROSCOPY/HOLMIUM LASER/STENT PLACEMENT Left 01/05/2022   Procedure: CYSTOSCOPY LEFT URETERAL STENT REMOVAL  LEFT RETROGRADE PYELOGRAM URETEROSCOPY BASKET STONE EXTRACTION STENT PLACEMENT;  Surgeon: Lucas Mallow, MD;  Location: [facility];  Service: Urology;  Laterality: Left;  1 HR FOR CASE   ENDOVEIN HARVEST OF GREATER SAPHENOUS VEIN Right 06/25/2018   Procedure: ENDOVEIN HARVEST OF GREATER SAPHENOUS VEIN;  Surgeon: Rexene Alberts, MD;  Location: [place];  Service: Open Heart Surgery;  Laterality: Right;   EXTRACORPOREAL SHOCK WAVE LITHOTRIPSY Left 12/07/2021   Procedure: EXTRACORPOREAL SHOCK WAVE LITHOTRIPSY (ESWL);  Surgeon: Festus Aloe, MD;  Location[facility];  Service: Urology;  Laterality: Left;   LEFT HEART CATH AND CORONARY ANGIOGRAPHY N/A 06/22/2018   Procedure: LEFT HEART CATH AND CORONARY ANGIOGRAPHY;  Surgeon: Nigel Mormon, MD;  Location: [facility];  Service: Cardiovascular;  Laterality: N/A;   SKIN SPLIT GRAFT Left 02/28/2022   Procedure: LEFT FOOT SKIN GRAFT;  Surgeon: Newt Minion, MD;  Location: [facility];  Service: Orthopedics;  Laterality: Left;   STUMP REVISION Left 07/06/2022   Procedure: REVISION LEFT BELOW KNEE AMPUTATION  WITH VAC PLACEMENT;  Surgeon: Newt Minion, MD;  Location: [place];  Service: Orthopedics;  Laterality: Left;   STUMP REVISION Left 08/03/2022   Procedure: REVISION LEFT BELOW KNEE AMPUTATION;  Surgeon: Newt Minion, MD;  Location: [place];  Service: Orthopedics;  Laterality: Left;   surgical debridement  Right    Right foot ulcer   TEE WITHOUT CARDIOVERSION N/A 06/25/2018   Procedure: TRANSESOPHAGEAL ECHOCARDIOGRAM (TEE);  Surgeon: Rexene Alberts, MD;  Location: [place];  Service: Open Heart Surgery;  Laterality: N/A;   Social History   Occupational History   Occupation: attorney  Tobacco Use   Smoking status: Never   Smokeless tobacco: Never  Vaping  Use   Vaping Use: Never used  Substance and  Sexual Activity   Alcohol use: Yes    Comment: maybe 2 beers a month   Drug use: Never   Sexual activity: Not on file

## 2022-08-08 LAB — AEROBIC/ANAEROBIC CULTURE W GRAM STAIN (SURGICAL/DEEP WOUND)

## 2022-08-09 ENCOUNTER — Other Ambulatory Visit (HOSPITAL_BASED_OUTPATIENT_CLINIC_OR_DEPARTMENT_OTHER): Payer: Self-pay

## 2022-08-09 ENCOUNTER — Encounter: Payer: Commercial Managed Care - PPO | Admitting: Orthopedic Surgery

## 2022-08-09 ENCOUNTER — Ambulatory Visit: Payer: 59

## 2022-08-09 ENCOUNTER — Encounter: Payer: 59 | Admitting: Orthopedic Surgery

## 2022-08-10 ENCOUNTER — Other Ambulatory Visit: Payer: Self-pay

## 2022-08-14 ENCOUNTER — Ambulatory Visit (INDEPENDENT_AMBULATORY_CARE_PROVIDER_SITE_OTHER): Payer: 59 | Admitting: Family

## 2022-08-14 DIAGNOSIS — Z89512 Acquired absence of left leg below knee: Secondary | ICD-10-CM

## 2022-08-14 NOTE — Progress Notes (Signed)
Post-Op Visit Note   [name]           Date of Birth: Mar 12, 1961           MRN: [id number] Visit Date: 08/14/2022 PCP: Mackie Pai, PA-C  Chief Complaint:  Chief Complaint  Patient presents with   Left Leg - Routine Post Op    HPI:  HPI The patient is a 62 year old gentleman seen status post revision left below-knee amputation January 5 this was his second revision.  Wound VAC removed today. Ortho Exam On examination of the left residual limb this is well-approximated sutures there is no gaping or drainage no erythema or warmth Visit Diagnoses: No diagnosis found.  Plan: Begin daily Dial soap cleansing.  Dry dressings shrinker.  Follow-up in the office in 2 weeks consider suture removal at that time Follow-Up Instructions: No follow-ups on file.   Imaging: No results found.  Orders:  No orders of the defined types were placed in this encounter.  No orders of the defined types were placed in this encounter.    PMFS History: Patient Active Problem List   Diagnosis Date Noted   S/P BKA (below knee amputation), left ([place]) 06/15/2022   Dehiscence of amputation stump of left lower extremity ([place]) 05/25/2022   Poorly controlled type 2 diabetes mellitus with circulatory disorder ([place]) 07/15/2018   Numbness in both hands 07/15/2018   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   Acute coronary syndrome ([place]) 06/22/2018   Coronary artery disease involving native coronary artery with unstable angina pectoris ([place]) 06/22/2018   Acute myocardial infarction ([place]) 06/22/2018   Hyperlipidemia    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   Vertigo 08/20/2016   Myringotomy tube status 08/20/2016   Ear fullness, left 08/20/2016   Asymmetrical left sensorineural hearing loss 02/06/2016   Neuropathy of left foot 02/03/2015   Charcot's joint of left foot, non-diabetic 02/03/2015   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   Cramping of hands 06/02/2014   Eustachian tube  dysfunction 06/02/2014   HTN (hypertension) 05/07/2014   Obstructive sleep apnea 05/07/2014   Past Medical History:  Diagnosis Date   Acute myocardial infarction ([place]) 06/22/2018   Allergy    Anemia    iron deficiency   Charcot's joint of left foot, non-diabetic    Coronary artery disease involving native coronary artery with unstable angina pectoris ([place]) 06/22/2018   Diabetes ([place])    type 2   GERD (gastroesophageal reflux disease)    History of kidney stones    passed   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 ([place]) 01/18/2021   Hyperlipidemia    Hypertension    Neuropathy of left foot    Nodular basal cell carcinoma (BCC) 07/10/2019   Right V of Neck (curet and 5FU)   Osteomyelitis of third toe of left foot ([place])    Pneumonia    As a child   Right foot ulcer ([place])    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   LIMA to LAD, SVG to Diag, SVG to OM2, SVG to [name], EVH via right thigh and leg   Sleep apnea    Subacute osteomyelitis, left ankle and foot ([place]) 05/25/2022   Type II diabetes mellitus with complication, uncontrolled    Uncontrolled type 2 diabetes mellitus     Family History  Problem Relation Age of Onset   Diabetes Mother    Stroke Father    Hypertension Father    Hearing loss  Father    Colon polyps Father    Colon cancer Neg Hx    Esophageal cancer Neg Hx    Prostate cancer Neg Hx     Past Surgical History:  Procedure Laterality Date   AMPUTATION Right 01/06/2020   Procedure: RIGHT FOOT 2ND RAY AMPUTATION;  Surgeon: Newt Minion, MD;  Location: [facility];  Service: Orthopedics;  Laterality: Right;   AMPUTATION Left 01/11/2021   Procedure: LEFT FOOT 3RD RAY AMPUTATION;  Surgeon: Newt Minion, MD;  Location: [facility];  Service: Orthopedics;  Laterality: Left;   AMPUTATION Left 12/22/2021   Procedure: LEFT TRANSMETATARSAL AMPUTATION;  Surgeon: Newt Minion, MD;  Location: [facility];  Service: Orthopedics;  Laterality: Left;   AMPUTATION Left 05/25/2022    Procedure: LEFT FOOT CHOPART;  Surgeon: Newt Minion, MD;  Location: [facility];  Service: Orthopedics;  Laterality: Left;   AMPUTATION Left 06/15/2022   Procedure: LEFT BELOW KNEE AMPUTATION;  Surgeon: Newt Minion, MD;  Location: [facility];  Service: Orthopedics;  Laterality: Left;   APPLICATION OF WOUND VAC  12/22/2021   Procedure: APPLICATION OF WOUND VAC;  Surgeon: Newt Minion, MD;  Location: [facility];  Service: Orthopedics;;   APPLICATION OF WOUND VAC Left 05/25/2022   Procedure: APPLICATION OF WOUND VAC;  Surgeon: Newt Minion, MD;  Location: [facility];  Service: Orthopedics;  Laterality: Left;   COLONOSCOPY W/ POLYPECTOMY     CORONARY ARTERY BYPASS GRAFT N/A 06/25/2018   Procedure: CORONARY ARTERY BYPASS GRAFTING (CABG) TIMES FOUR USING LEFT INTERNAL MAMMARY ARTERY AND RIGHT ENDOSCOPICALLY HARVESTED SAPHENOUS VEIN;  Surgeon: Rexene Alberts, MD;  Location: [facility];  Service: Open Heart Surgery;  Laterality: N/A;   CYSTOSCOPY WITH STENT PLACEMENT Left 12/04/2021   Procedure: CYSTOSCOPY/RETROGRADE/ STENT PLACEMENT;  Surgeon: Vira Agar, MD;  Location[facility];  Service: Urology;  Laterality: Left;   CYSTOSCOPY/URETEROSCOPY/HOLMIUM LASER/STENT PLACEMENT Left 01/05/2022   Procedure: CYSTOSCOPY LEFT URETERAL STENT REMOVAL  LEFT RETROGRADE PYELOGRAM URETEROSCOPY BASKET STONE EXTRACTION STENT PLACEMENT;  Surgeon: [place] Mallow, MD;  Location[facility];  Service: Urology;  Laterality: Left;  1 HR FOR CASE   ENDOVEIN HARVEST OF GREATER SAPHENOUS VEIN Right 06/25/2018   Procedure: ENDOVEIN HARVEST OF GREATER SAPHENOUS VEIN;  Surgeon: Rexene Alberts, MD;  Location: [facility];  Service: Open Heart Surgery;  Laterality: Right;   EXTRACORPOREAL SHOCK WAVE LITHOTRIPSY Left 12/07/2021   Procedure: EXTRACORPOREAL SHOCK WAVE LITHOTRIPSY (ESWL);  Surgeon: Festus Aloe, MD;  Location: [facility];  Service: Urology;  Laterality: Left;   LEFT HEART CATH AND CORONARY ANGIOGRAPHY N/A 06/22/2018    Procedure: LEFT HEART CATH AND CORONARY ANGIOGRAPHY;  Surgeon: Nigel Mormon, MD;  Location: [facility];  Service: Cardiovascular;  Laterality: N/A;   SKIN SPLIT GRAFT Left 02/28/2022   Procedure: LEFT FOOT SKIN GRAFT;  Surgeon: Newt Minion, MD;  Location: [facility] [place];  Service: Orthopedics;  Laterality: Left;   STUMP REVISION Left 07/06/2022   Procedure: REVISION LEFT BELOW KNEE AMPUTATION  WITH VAC PLACEMENT;  Surgeon: Newt Minion, MD;  Location: [place];  Service: Orthopedics;  Laterality: Left;   STUMP REVISION Left 08/03/2022   Procedure: REVISION LEFT BELOW KNEE AMPUTATION;  Surgeon: Newt Minion, MD;  Location: [facility];  Service: Orthopedics;  Laterality: Left;   surgical debridement  Right    Right foot ulcer   TEE WITHOUT CARDIOVERSION N/A 06/25/2018   Procedure: TRANSESOPHAGEAL ECHOCARDIOGRAM (TEE);  Surgeon: Roxy Manns,  Valentina Gu, MD;  Location: [facility];  Service: Open Heart Surgery;  Laterality: N/A;   Social History   Occupational History   Occupation: attorney  Tobacco Use   Smoking status: Never   Smokeless tobacco: Never  Vaping Use   Vaping Use: Never used  Substance and Sexual Activity   Alcohol use: Yes    Comment: maybe 2 beers a month   Drug use: Never   Sexual activity: Not on file

## 2022-08-17 ENCOUNTER — Other Ambulatory Visit (HOSPITAL_COMMUNITY): Payer: Self-pay

## 2022-08-17 ENCOUNTER — Other Ambulatory Visit (HOSPITAL_BASED_OUTPATIENT_CLINIC_OR_DEPARTMENT_OTHER): Payer: Self-pay

## 2022-08-17 ENCOUNTER — Other Ambulatory Visit: Payer: Self-pay | Admitting: Medical

## 2022-08-17 DIAGNOSIS — N528 Other male erectile dysfunction: Secondary | ICD-10-CM

## 2022-08-17 MED ORDER — SILDENAFIL CITRATE 20 MG PO TABS
ORAL_TABLET | ORAL | 1 refills | Status: DC
  Filled 2022-08-17: qty 50, 10d supply, fill #0
  Filled 2022-09-05: qty 50, 10d supply, fill #1

## 2022-08-20 ENCOUNTER — Other Ambulatory Visit (HOSPITAL_COMMUNITY): Payer: Self-pay

## 2022-08-20 MED ORDER — TESTOSTERONE 30 MG/ACT TD SOLN
2.0000 | Freq: Every day | TRANSDERMAL | 1 refills | Status: DC
  Filled 2022-08-20 – 2022-09-05 (×2): qty 90, 30d supply, fill #0
  Filled 2022-10-15: qty 90, 30d supply, fill #1

## 2022-08-28 ENCOUNTER — Ambulatory Visit (INDEPENDENT_AMBULATORY_CARE_PROVIDER_SITE_OTHER): Payer: 59 | Admitting: Family

## 2022-08-28 ENCOUNTER — Encounter: Payer: Self-pay | Admitting: Family

## 2022-08-28 DIAGNOSIS — Z89512 Acquired absence of left leg below knee: Secondary | ICD-10-CM

## 2022-08-28 NOTE — Progress Notes (Addendum)
Post-Op Visit Note   [name]           Date of Birth: [birth year]           MRN: [id number] Visit Date: 08/28/2022 PCP: Esperanza Richters, PA-C  Chief Complaint:  Chief Complaint  Patient presents with   Left Leg - Routine Post Op    08/03/2022 left BKA revision kerecis graft     HPI:  HPI The patient is a 62 year old gentleman seen status post left below-knee amputation revision with Kerecis graft placement he is currently using a scooter for mobility  Patient is a new left transtibial  amputee.  Patient's current comorbidities are not expected to impact the ability to function with the prescribed prosthesis. Patient verbally communicates a strong desire to use a prosthesis. Patient currently requires mobility aids to ambulate without a prosthesis.  Expects not to use mobility aids with a new prosthesis.  Patient is a K3 level ambulator that spends a lot of time walking around on uneven terrain over obstacles, up and down stairs, and ambulates with a variable cadence.     Ortho Exam On examination of the left residual limb this is healing well there are 2 areas that have gaped a little of these are filled in with granulation there is mild to moderate serosanguineous drainage centrally no erythema warmth or sign of infection  Visit Diagnoses: No diagnosis found.  Plan: Sutures harvested today.  Given silver cell to use over the area with drainage continue shrinker discussed proper donning of the shrinker given an order for his new prosthesis set up continue the doxycycline until completed Follow-Up Instructions: No follow-ups on file.   Imaging: No results found.  Orders:  No orders of the defined types were placed in this encounter.  No orders of the defined types were placed in this encounter.    PMFS History: Patient Active Problem List   Diagnosis Date Noted   S/P BKA (below knee amputation), left ([name] 06/15/2022   Poorly controlled type 2  diabetes mellitus with circulatory disorder ([name] 07/15/2018   Numbness in both hands 07/15/2018   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   Acute coronary syndrome ([name] 06/22/2018   Coronary artery disease involving native coronary artery with unstable angina pectoris ([name] 06/22/2018   Acute myocardial infarction ([name] 06/22/2018   Hyperlipidemia    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   Vertigo 08/20/2016   Myringotomy tube status 08/20/2016   Ear fullness, left 08/20/2016   Asymmetrical left sensorineural hearing loss 02/06/2016   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   Cramping of hands 06/02/2014   Eustachian tube dysfunction 06/02/2014   HTN (hypertension) 05/07/2014   Obstructive sleep apnea 05/07/2014   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 ([name] 01/18/2021   Hyperlipidemia    Hypertension    Neuropathy of left foot    Nodular basal cell carcinoma (BCC) 07/10/2019   Right V of Neck (curet and 5FU)   Osteomyelitis of third toe of left foot (  [name]    Pneumonia    As a child   Right foot ulcer ([name]    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   LIMA to LAD, SVG to Diag, SVG to OM2, SVG to [name], EVH via right thigh and leg   Sleep apnea    Subacute osteomyelitis, left ankle and foot ([name] 05/25/2022   Type II diabetes mellitus with complication, uncontrolled    Uncontrolled type 2 diabetes mellitus     Family History  Problem Relation Age of Onset   Diabetes Mother    Stroke Father    Hypertension Father    Hearing loss Father    Colon polyps Father    Colon cancer Neg Hx     Esophageal cancer Neg Hx    Prostate cancer Neg Hx     Past Surgical History:  Procedure Laterality Date   AMPUTATION Right 01/06/2020   Procedure: RIGHT FOOT 2ND RAY AMPUTATION;  Surgeon: Nadara Mustarduda, Marcus V, MD;  [facility];  Service: Orthopedics;  Laterality: Right;   AMPUTATION Left 01/11/2021   Procedure: LEFT FOOT 3RD RAY AMPUTATION;  Surgeon: Nadara Mustarduda, Marcus V, MD;  [facility];  Service: Orthopedics;  Laterality: Left;   AMPUTATION Left 12/22/2021   Procedure: LEFT TRANSMETATARSAL AMPUTATION;  Surgeon: Nadara Mustarduda, Marcus V, MD;  [facility];  Service: Orthopedics;  Laterality: Left;   AMPUTATION Left 05/25/2022   Procedure: LEFT FOOT CHOPART;  Surgeon: Nadara Mustarduda, Marcus V, MD;  [facility];  Service: Orthopedics;  Laterality: Left;   AMPUTATION Left 06/15/2022   Procedure: LEFT BELOW KNEE AMPUTATION;  Surgeon: Nadara Mustarduda, Marcus V, MD;  [facility];  Service: Orthopedics;  Laterality: Left;   APPLICATION OF WOUND VAC  12/22/2021   Procedure: APPLICATION OF WOUND VAC;  Surgeon: Nadara Mustarduda, Marcus V, MD;  [facility];  Service: Orthopedics;;   APPLICATION OF WOUND VAC Left 05/25/2022   Procedure: APPLICATION OF WOUND VAC;  Surgeon: Nadara Mustarduda, Marcus V, MD;  [facility];  Service: Orthopedics;  Laterality: Left;   COLONOSCOPY W/ POLYPECTOMY     CORONARY ARTERY BYPASS GRAFT N/A 06/25/2018   Procedure: CORONARY ARTERY BYPASS GRAFTING (CABG) TIMES FOUR USING LEFT INTERNAL MAMMARY ARTERY AND RIGHT ENDOSCOPICALLY HARVESTED SAPHENOUS VEIN;  Surgeon: Purcell Nailswen, Clarence H, MD;  [facility];  Service: Open Heart Surgery;  Laterality: N/A;   CYSTOSCOPY WITH STENT PLACEMENT Left 12/04/2021   Procedure: CYSTOSCOPY/RETROGRADE/ STENT PLACEMENT;  Surgeon: Despina AriasMachen, Graham L, MD;  [facility];  Service: Urology;  Laterality: Left;   CYSTOSCOPY/URETEROSCOPY/HOLMIUM LASER/STENT PLACEMENT Left 01/05/2022   Procedure: CYSTOSCOPY LEFT URETERAL STENT REMOVAL  LEFT RETROGRADE PYELOGRAM URETEROSCOPY BASKET STONE  EXTRACTION STENT PLACEMENT;  Surgeon: Crista ElliotBell, Eugene D III, MD;  [facility];  Service: Urology;  Laterality: Left;  1 HR FOR CASE   ENDOVEIN HARVEST OF GREATER SAPHENOUS VEIN Right 06/25/2018   Procedure: ENDOVEIN HARVEST OF GREATER SAPHENOUS VEIN;  Surgeon: Purcell Nailswen, Clarence H, MD;  [facility];  Service: Open Heart Surgery;  Laterality: Right;   EXTRACORPOREAL SHOCK WAVE LITHOTRIPSY Left 12/07/2021   Procedure: EXTRACORPOREAL SHOCK WAVE LITHOTRIPSY (ESWL);  Surgeon: Jerilee FieldEskridge, Matthew, MD;  [facility];  Service: Urology;  Laterality: Left;   LEFT HEART CATH AND CORONARY ANGIOGRAPHY N/A 06/22/2018   Procedure: LEFT HEART CATH AND CORONARY ANGIOGRAPHY;  Surgeon: Elder NegusPatwardhan, Manish J, MD;  [facility];  Service: Cardiovascular;  Laterality: N/A;   SKIN SPLIT GRAFT Left 02/28/2022   Procedure: LEFT FOOT SKIN GRAFT;  Surgeon: Nadara Mustard, MD;  [facility];  Service: Orthopedics;  Laterality: Left;   STUMP REVISION Left 07/06/2022   Procedure: REVISION LEFT BELOW KNEE AMPUTATION  WITH VAC PLACEMENT;  Surgeon: Nadara Mustard, MD;  [facility];  Service: Orthopedics;  Laterality: Left;   STUMP REVISION Left 08/03/2022   Procedure: REVISION LEFT BELOW KNEE AMPUTATION;  Surgeon: Nadara Mustard, MD;  [facility];  Service: Orthopedics;  Laterality: Left;   surgical debridement  Right    Right foot ulcer   TEE WITHOUT CARDIOVERSION N/A 06/25/2018   Procedure: TRANSESOPHAGEAL ECHOCARDIOGRAM (TEE);  Surgeon: Purcell Nails, MD;  [facility] OR;  Service: Open Heart Surgery;  Laterality: N/A;   Social History   Occupational History   Occupation: attorney  Tobacco Use   Smoking status: Never   Smokeless tobacco: Never  Vaping Use   Vaping Use: Never used  Substance and Sexual Activity   Alcohol use: Yes    Comment: maybe 2 beers a month   Drug use: Never   Sexual activity: Not on file

## 2022-08-30 ENCOUNTER — Other Ambulatory Visit (HOSPITAL_COMMUNITY): Payer: Self-pay

## 2022-09-05 ENCOUNTER — Other Ambulatory Visit: Payer: Self-pay | Admitting: Internal Medicine

## 2022-09-05 ENCOUNTER — Other Ambulatory Visit (HOSPITAL_COMMUNITY): Payer: Self-pay

## 2022-09-06 ENCOUNTER — Other Ambulatory Visit: Payer: Self-pay

## 2022-09-11 ENCOUNTER — Ambulatory Visit (INDEPENDENT_AMBULATORY_CARE_PROVIDER_SITE_OTHER): Payer: 59 | Admitting: Family

## 2022-09-11 ENCOUNTER — Encounter: Payer: Self-pay | Admitting: Family

## 2022-09-11 DIAGNOSIS — Z89512 Acquired absence of left leg below knee: Secondary | ICD-10-CM

## 2022-09-11 NOTE — Progress Notes (Signed)
Post-Op Visit Note   [name]           Date of Birth: [birth year]           MRN: [id number] Visit Date: 09/11/2022 PCP: Mackie Pai, PA-C  Chief Complaint:  Chief Complaint  Patient presents with   Left Leg - Routine Post Op    08/03/2022 left BKA revision kerecis graft    HPI:  HPI The patient is a 62 year old gentleman seen status post left below-knee amputation revision Ortho Exam On examination left residual limb there are 3 remaining open areas with scant serosanguineous drainage.  There is no surrounding erythema or warmth the limb is consolidating well  Visit Diagnoses: No diagnosis found.  Plan: Continue daily Dial soap cleansing silver cell.  Continue shrinker's proceed with prosthesis set up  Follow-Up Instructions: No follow-ups on file.   Imaging: No results found.  Orders:  No orders of the defined types were placed in this encounter.  No orders of the defined types were placed in this encounter.    PMFS History: Patient Active Problem List   Diagnosis Date Noted   S/P BKA (below knee amputation), left ([place]) 06/15/2022   Poorly controlled type 2 diabetes mellitus with circulatory disorder ([place]) 07/15/2018   Numbness in both hands 07/15/2018   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   Acute coronary syndrome ([place]) 06/22/2018   Coronary artery disease involving native coronary artery with unstable angina pectoris ([place]) 06/22/2018   Acute myocardial infarction ([place]) 06/22/2018   Hyperlipidemia    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   Vertigo 08/20/2016   Myringotomy tube status 08/20/2016   Ear fullness, left 08/20/2016   Asymmetrical left sensorineural hearing loss 02/06/2016   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   Cramping of hands 06/02/2014   Eustachian tube dysfunction 06/02/2014   HTN (hypertension) 05/07/2014   Obstructive sleep apnea 05/07/2014   Past Medical History:  Diagnosis Date   Acute myocardial infarction  ([place]) 06/22/2018   Allergy    Anemia    iron deficiency   Charcot's joint of left foot, non-diabetic    Coronary artery disease involving native coronary artery with unstable angina pectoris ([place]) 06/22/2018   Diabetes ([place])    type 2   GERD (gastroesophageal reflux disease)    History of kidney stones    passed   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 ([place]) 01/18/2021   Hyperlipidemia    Hypertension    Neuropathy of left foot    Nodular basal cell carcinoma (BCC) 07/10/2019   Right V of Neck (curet and 5FU)   Osteomyelitis of third toe of left foot ([place])    Pneumonia    As a child   Right foot ulcer ([place])    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   LIMA to LAD, SVG to Diag, SVG to OM2, SVG to [name], EVH via right thigh and leg   Sleep apnea    Subacute osteomyelitis, left ankle and foot ([place]) 05/25/2022   Type II diabetes mellitus with complication, uncontrolled    Uncontrolled type 2 diabetes mellitus     Family History  Problem Relation Age of Onset   Diabetes Mother    Stroke Father    Hypertension Father    Hearing loss Father    Colon polyps Father    Colon cancer Neg Hx    Esophageal cancer Neg Hx    Prostate cancer Neg Hx  Past Surgical History:  Procedure Laterality Date   AMPUTATION Right 01/06/2020   Procedure: RIGHT FOOT 2ND RAY AMPUTATION;  Surgeon: Newt Minion, MD;  Location: [place];  Service: Orthopedics;  Laterality: Right;   AMPUTATION Left 01/11/2021   Procedure: LEFT FOOT 3RD RAY AMPUTATION;  Surgeon: Newt Minion, MD;  Location: [place];  Service: Orthopedics;  Laterality: Left;   AMPUTATION Left 12/22/2021   Procedure: LEFT TRANSMETATARSAL AMPUTATION;  Surgeon: Newt Minion, MD;  Location: [place];  Service: Orthopedics;  Laterality: Left;   AMPUTATION Left 05/25/2022   Procedure: LEFT FOOT CHOPART;  Surgeon: Newt Minion, MD;  Location: [place];  Service: Orthopedics;  Laterality: Left;   AMPUTATION Left 06/15/2022   Procedure: LEFT  BELOW KNEE AMPUTATION;  Surgeon: Newt Minion, MD;  Location: [place];  Service: Orthopedics;  Laterality: Left;   APPLICATION OF WOUND VAC  12/22/2021   Procedure: APPLICATION OF WOUND VAC;  Surgeon: Newt Minion, MD;  Location: [place];  Service: Orthopedics;;   APPLICATION OF WOUND VAC Left 05/25/2022   Procedure: APPLICATION OF WOUND VAC;  Surgeon: Newt Minion, MD;  Location: [place];  Service: Orthopedics;  Laterality: Left;   COLONOSCOPY W/ POLYPECTOMY     CORONARY ARTERY BYPASS GRAFT N/A 06/25/2018   Procedure: CORONARY ARTERY BYPASS GRAFTING (CABG) TIMES FOUR USING LEFT INTERNAL MAMMARY ARTERY AND RIGHT ENDOSCOPICALLY HARVESTED SAPHENOUS VEIN;  Surgeon: Rexene Alberts, MD;  Location: [place];  Service: Open Heart Surgery;  Laterality: N/A;   CYSTOSCOPY WITH STENT PLACEMENT Left 12/04/2021   Procedure: CYSTOSCOPY/RETROGRADE/ STENT PLACEMENT;  Surgeon: Vira Agar, MD;  [facility];  Service: Urology;  Laterality: Left;   CYSTOSCOPY/URETEROSCOPY/HOLMIUM LASER/STENT PLACEMENT Left 01/05/2022   Procedure: CYSTOSCOPY LEFT URETERAL STENT REMOVAL  LEFT RETROGRADE PYELOGRAM URETEROSCOPY BASKET STONE EXTRACTION STENT PLACEMENT;  Surgeon: Lucas Mallow, MD;  [facility];  Service: Urology;  Laterality: Left;  1 HR FOR CASE   ENDOVEIN HARVEST OF GREATER SAPHENOUS VEIN Right 06/25/2018   Procedure: ENDOVEIN HARVEST OF GREATER SAPHENOUS VEIN;  Surgeon: Rexene Alberts, MD;  Location: [place];  Service: Open Heart Surgery;  Laterality: Right;   EXTRACORPOREAL SHOCK WAVE LITHOTRIPSY Left 12/07/2021   Procedure: EXTRACORPOREAL SHOCK WAVE LITHOTRIPSY (ESWL);  Surgeon: Festus Aloe, MD;  [facility];  Service: Urology;  Laterality: Left;   LEFT HEART CATH AND CORONARY ANGIOGRAPHY N/A 06/22/2018   Procedure: LEFT HEART CATH AND CORONARY ANGIOGRAPHY;  Surgeon: Nigel Mormon, MD;  Location: [place] CV LAB;  Service: Cardiovascular;  Laterality: N/A;   SKIN  SPLIT GRAFT Left 02/28/2022   Procedure: LEFT FOOT SKIN GRAFT;  Surgeon: Newt Minion, MD;  Location: [place];  Service: Orthopedics;  Laterality: Left;   STUMP REVISION Left 07/06/2022   Procedure: REVISION LEFT BELOW KNEE AMPUTATION  WITH VAC PLACEMENT;  Surgeon: Newt Minion, MD;  Location: [place];  Service: Orthopedics;  Laterality: Left;   STUMP REVISION Left 08/03/2022   Procedure: REVISION LEFT BELOW KNEE AMPUTATION;  Surgeon: Newt Minion, MD;  Location: [place];  Service: Orthopedics;  Laterality: Left;   surgical debridement  Right    Right foot ulcer   TEE WITHOUT CARDIOVERSION N/A 06/25/2018   Procedure: TRANSESOPHAGEAL ECHOCARDIOGRAM (TEE);  Surgeon: Rexene Alberts, MD;  Location: [place];  Service: Open Heart Surgery;  Laterality: N/A;   Social History   Occupational History   Occupation: attorney  Tobacco Use  Smoking status: Never   Smokeless tobacco: Never  Vaping Use   Vaping Use: Never used  Substance and Sexual Activity   Alcohol use: Yes    Comment: maybe 2 beers a month   Drug use: Never   Sexual activity: Not on file

## 2022-09-18 ENCOUNTER — Other Ambulatory Visit (HOSPITAL_COMMUNITY): Payer: Self-pay

## 2022-09-19 ENCOUNTER — Other Ambulatory Visit (HOSPITAL_COMMUNITY): Payer: Self-pay

## 2022-09-19 ENCOUNTER — Other Ambulatory Visit (HOSPITAL_BASED_OUTPATIENT_CLINIC_OR_DEPARTMENT_OTHER): Payer: Self-pay

## 2022-09-19 ENCOUNTER — Other Ambulatory Visit: Payer: Self-pay

## 2022-09-20 ENCOUNTER — Other Ambulatory Visit (HOSPITAL_BASED_OUTPATIENT_CLINIC_OR_DEPARTMENT_OTHER): Payer: Self-pay

## 2022-09-20 ENCOUNTER — Other Ambulatory Visit: Payer: Self-pay | Admitting: Orthopedic Surgery

## 2022-09-20 MED ORDER — GABAPENTIN 300 MG PO CAPS
300.0000 mg | ORAL_CAPSULE | Freq: Every day | ORAL | 3 refills | Status: DC
  Filled 2022-09-20 – 2022-10-15 (×2): qty 30, 30d supply, fill #0
  Filled 2022-11-12: qty 30, 30d supply, fill #1
  Filled 2022-12-10: qty 30, 30d supply, fill #2
  Filled 2023-01-08: qty 30, 30d supply, fill #3

## 2022-09-28 ENCOUNTER — Other Ambulatory Visit (HOSPITAL_BASED_OUTPATIENT_CLINIC_OR_DEPARTMENT_OTHER): Payer: Self-pay

## 2022-10-10 ENCOUNTER — Encounter: Payer: Self-pay | Admitting: Family

## 2022-10-10 ENCOUNTER — Ambulatory Visit (INDEPENDENT_AMBULATORY_CARE_PROVIDER_SITE_OTHER): Payer: 59 | Admitting: Family

## 2022-10-10 DIAGNOSIS — Z89512 Acquired absence of left leg below knee: Secondary | ICD-10-CM

## 2022-10-10 NOTE — Progress Notes (Signed)
Post-Op Visit Note   [name]           Date of Birth: 1961/07/01           MRN: [id number] Visit Date: 10/10/2022 PCP: Mackie Pai, PA-C  Chief Complaint:  Chief Complaint  Patient presents with   Left Leg - Routine Post Op    08/03/2022 revision left BKA     HPI:  HPI The patient is a 62 year old gentleman seen status post revision left below-knee amputation he has been casted for his prosthesis Ortho Exam On examination of the left residual limb this is well consolidated well-healed there is no gaping no drainage no open area  Visit Diagnoses: No diagnosis found.  Plan: Will proceed with prosthesis set up follow-up in the office in 6 months or as needed Follow-Up Instructions: No follow-ups on file.   Imaging: No results found.  Orders:  No orders of the defined types were placed in this encounter.  No orders of the defined types were placed in this encounter.    PMFS History: Patient Active Problem List   Diagnosis Date Noted   S/P BKA (below knee amputation), left ([place]) 06/15/2022   Poorly controlled type 2 diabetes mellitus with circulatory disorder ([place]) 07/15/2018   Numbness in both hands 07/15/2018   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   Acute coronary syndrome ([place]) 06/22/2018   Coronary artery disease involving native coronary artery with unstable angina pectoris ([place]) 06/22/2018   Acute myocardial infarction ([place]) 06/22/2018   Hyperlipidemia    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   Vertigo 08/20/2016   Myringotomy tube status 08/20/2016   Ear fullness, left 08/20/2016   Asymmetrical left sensorineural hearing loss 02/06/2016   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   Cramping of hands 06/02/2014   Eustachian tube dysfunction 06/02/2014   HTN (hypertension) 05/07/2014   Obstructive sleep apnea 05/07/2014   Past Medical History:  Diagnosis Date   Acute myocardial infarction ([place]) 06/22/2018   Allergy    Anemia    iron  deficiency   Charcot's joint of left foot, non-diabetic    Coronary artery disease involving native coronary artery with unstable angina pectoris ([place]) 06/22/2018   Diabetes ([place])    type 2   GERD (gastroesophageal reflux disease)    History of kidney stones    passed   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 ([place]) 01/18/2021   Hyperlipidemia    Hypertension    Neuropathy of left foot    Nodular basal cell carcinoma (BCC) 07/10/2019   Right V of Neck (curet and 5FU)   Osteomyelitis of third toe of left foot ([place])    Pneumonia    As a child   Right foot ulcer ([place])    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   LIMA to LAD, SVG to Diag, SVG to OM2, SVG to [name], EVH via right thigh and leg   Sleep apnea    Subacute osteomyelitis, left ankle and foot ([place]) 05/25/2022   Type II diabetes mellitus with complication, uncontrolled    Uncontrolled type 2 diabetes mellitus     Family History  Problem Relation Age of Onset   Diabetes Mother    Stroke Father    Hypertension Father    Hearing loss Father    Colon polyps Father    Colon cancer Neg Hx    Esophageal cancer Neg Hx    Prostate cancer Neg Hx  Past Surgical History:  Procedure Laterality Date   AMPUTATION Right 01/06/2020   Procedure: RIGHT FOOT 2ND RAY AMPUTATION;  Surgeon: Newt Minion, MD;  Location: [place];  Service: Orthopedics;  Laterality: Right;   AMPUTATION Left 01/11/2021   Procedure: LEFT FOOT 3RD RAY AMPUTATION;  Surgeon: Newt Minion, MD;  Location: [place];  Service: Orthopedics;  Laterality: Left;   AMPUTATION Left 12/22/2021   Procedure: LEFT TRANSMETATARSAL AMPUTATION;  Surgeon: Newt Minion, MD;  Location: [place];  Service: Orthopedics;  Laterality: Left;   AMPUTATION Left 05/25/2022   Procedure: LEFT FOOT CHOPART;  Surgeon: Newt Minion, MD;  Location: [place];  Service: Orthopedics;  Laterality: Left;   AMPUTATION Left 06/15/2022   Procedure: LEFT BELOW KNEE AMPUTATION;  Surgeon: Newt Minion,  MD;  Location: [place];  Service: Orthopedics;  Laterality: Left;   APPLICATION OF WOUND VAC  12/22/2021   Procedure: APPLICATION OF WOUND VAC;  Surgeon: Newt Minion, MD;  Location: [place];  Service: Orthopedics;;   APPLICATION OF WOUND VAC Left 05/25/2022   Procedure: APPLICATION OF WOUND VAC;  Surgeon: Newt Minion, MD;  Location: [place];  Service: Orthopedics;  Laterality: Left;   COLONOSCOPY W/ POLYPECTOMY     CORONARY ARTERY BYPASS GRAFT N/A 06/25/2018   Procedure: CORONARY ARTERY BYPASS GRAFTING (CABG) TIMES FOUR USING LEFT INTERNAL MAMMARY ARTERY AND RIGHT ENDOSCOPICALLY HARVESTED SAPHENOUS VEIN;  Surgeon: Rexene Alberts, MD;  Location: [place];  Service: Open Heart Surgery;  Laterality: N/A;   CYSTOSCOPY WITH STENT PLACEMENT Left 12/04/2021   Procedure: CYSTOSCOPY/RETROGRADE/ STENT PLACEMENT;  Surgeon: Vira Agar, MD;  [facility];  Service: Urology;  Laterality: Left;   CYSTOSCOPY/URETEROSCOPY/HOLMIUM LASER/STENT PLACEMENT Left 01/05/2022   Procedure: CYSTOSCOPY LEFT URETERAL STENT REMOVAL  LEFT RETROGRADE PYELOGRAM URETEROSCOPY BASKET STONE EXTRACTION STENT PLACEMENT;  Surgeon: Lucas Mallow, MD;  [facility];  Service: Urology;  Laterality: Left;  1 HR FOR CASE   ENDOVEIN HARVEST OF GREATER SAPHENOUS VEIN Right 06/25/2018   Procedure: ENDOVEIN HARVEST OF GREATER SAPHENOUS VEIN;  Surgeon: Rexene Alberts, MD;  Location: [place];  Service: Open Heart Surgery;  Laterality: Right;   EXTRACORPOREAL SHOCK WAVE LITHOTRIPSY Left 12/07/2021   Procedure: EXTRACORPOREAL SHOCK WAVE LITHOTRIPSY (ESWL);  Surgeon: Festus Aloe, MD;  Location[facility];  Service: Urology;  Laterality: Left;   LEFT HEART CATH AND CORONARY ANGIOGRAPHY N/A 06/22/2018   Procedure: LEFT HEART CATH AND CORONARY ANGIOGRAPHY;  Surgeon: Nigel Mormon, MD;  Location: [place] CV LAB;  Service: Cardiovascular;  Laterality: N/A;   SKIN SPLIT GRAFT Left 02/28/2022   Procedure: LEFT FOOT  SKIN GRAFT;  Surgeon: Newt Minion, MD;  Location: [place];  Service: Orthopedics;  Laterality: Left;   STUMP REVISION Left 07/06/2022   Procedure: REVISION LEFT BELOW KNEE AMPUTATION  WITH VAC PLACEMENT;  Surgeon: Newt Minion, MD;  Location: [place];  Service: Orthopedics;  Laterality: Left;   STUMP REVISION Left 08/03/2022   Procedure: REVISION LEFT BELOW KNEE AMPUTATION;  Surgeon: Newt Minion, MD;  Location: [place];  Service: Orthopedics;  Laterality: Left;   surgical debridement  Right    Right foot ulcer   TEE WITHOUT CARDIOVERSION N/A 06/25/2018   Procedure: TRANSESOPHAGEAL ECHOCARDIOGRAM (TEE);  Surgeon: Rexene Alberts, MD;  Location: [place];  Service: Open Heart Surgery;  Laterality: N/A;   Social History   Occupational History   Occupation: attorney  Tobacco Use  Smoking status: Never   Smokeless tobacco: Never  Vaping Use   Vaping Use: Never used  Substance and Sexual Activity   Alcohol use: Yes    Comment: maybe 2 beers a month   Drug use: Never   Sexual activity: Not on file

## 2022-10-11 ENCOUNTER — Other Ambulatory Visit: Payer: Self-pay

## 2022-10-11 DIAGNOSIS — Z89512 Acquired absence of left leg below knee: Secondary | ICD-10-CM

## 2022-10-15 ENCOUNTER — Other Ambulatory Visit (HOSPITAL_BASED_OUTPATIENT_CLINIC_OR_DEPARTMENT_OTHER): Payer: Self-pay

## 2022-10-15 ENCOUNTER — Other Ambulatory Visit: Payer: Self-pay

## 2022-10-15 ENCOUNTER — Other Ambulatory Visit: Payer: Self-pay | Admitting: Orthopedic Surgery

## 2022-10-15 ENCOUNTER — Other Ambulatory Visit: Payer: Self-pay | Admitting: Medical

## 2022-10-15 ENCOUNTER — Other Ambulatory Visit: Payer: Self-pay | Admitting: Cardiovascular Disease

## 2022-10-15 ENCOUNTER — Other Ambulatory Visit: Payer: Self-pay | Admitting: Internal Medicine

## 2022-10-15 DIAGNOSIS — N528 Other male erectile dysfunction: Secondary | ICD-10-CM

## 2022-10-15 MED ORDER — HYDROCHLOROTHIAZIDE 12.5 MG PO CAPS
12.5000 mg | ORAL_CAPSULE | Freq: Every day | ORAL | 0 refills | Status: DC
  Filled 2022-10-15: qty 30, 30d supply, fill #0

## 2022-10-15 MED ORDER — ASPIRIN 81 MG PO TBEC
81.0000 mg | DELAYED_RELEASE_TABLET | Freq: Every day | ORAL | 0 refills | Status: AC
  Filled 2022-10-15: qty 120, 120d supply, fill #0

## 2022-10-16 ENCOUNTER — Other Ambulatory Visit (HOSPITAL_BASED_OUTPATIENT_CLINIC_OR_DEPARTMENT_OTHER): Payer: Self-pay

## 2022-10-16 MED ORDER — METOPROLOL TARTRATE 25 MG PO TABS
25.0000 mg | ORAL_TABLET | Freq: Two times a day (BID) | ORAL | 1 refills | Status: DC
  Filled 2022-10-16: qty 180, 90d supply, fill #0
  Filled 2023-01-08: qty 180, 90d supply, fill #1

## 2022-10-24 ENCOUNTER — Other Ambulatory Visit (HOSPITAL_BASED_OUTPATIENT_CLINIC_OR_DEPARTMENT_OTHER): Payer: Self-pay

## 2022-10-24 MED ORDER — ALPRAZOLAM 0.5 MG PO TABS
0.5000 mg | ORAL_TABLET | Freq: Two times a day (BID) | ORAL | 1 refills | Status: DC | PRN
  Filled 2022-10-24: qty 20, 10d supply, fill #0
  Filled 2022-12-05: qty 20, 10d supply, fill #1

## 2022-11-01 ENCOUNTER — Other Ambulatory Visit: Payer: Self-pay | Admitting: Medical

## 2022-11-01 ENCOUNTER — Other Ambulatory Visit (HOSPITAL_BASED_OUTPATIENT_CLINIC_OR_DEPARTMENT_OTHER): Payer: Self-pay

## 2022-11-01 DIAGNOSIS — N528 Other male erectile dysfunction: Secondary | ICD-10-CM

## 2022-11-01 MED ORDER — TADALAFIL 10 MG PO TABS
10.0000 mg | ORAL_TABLET | ORAL | 1 refills | Status: DC | PRN
  Filled 2022-11-01: qty 10, 20d supply, fill #0
  Filled 2023-01-08: qty 10, 20d supply, fill #1

## 2022-11-01 MED ORDER — SILDENAFIL CITRATE 20 MG PO TABS
40.0000 mg | ORAL_TABLET | Freq: Every day | ORAL | 1 refills | Status: DC | PRN
  Filled 2022-11-01: qty 50, 10d supply, fill #0
  Filled 2022-12-10: qty 50, 10d supply, fill #1

## 2022-11-12 ENCOUNTER — Other Ambulatory Visit: Payer: Self-pay | Admitting: Medical

## 2022-11-12 ENCOUNTER — Other Ambulatory Visit (HOSPITAL_COMMUNITY): Payer: Self-pay

## 2022-11-12 ENCOUNTER — Other Ambulatory Visit: Payer: Self-pay | Admitting: Internal Medicine

## 2022-11-12 MED ORDER — TESTOSTERONE 30 MG/ACT TD SOLN
TRANSDERMAL | 1 refills | Status: AC
  Filled 2022-11-12 – 2022-12-10 (×2): qty 90, 30d supply, fill #0
  Filled 2023-01-08 – 2023-02-18 (×2): qty 90, 30d supply, fill #1

## 2022-11-13 ENCOUNTER — Encounter: Payer: 59 | Admitting: Physical Therapy

## 2022-11-13 ENCOUNTER — Other Ambulatory Visit: Payer: Self-pay

## 2022-11-13 ENCOUNTER — Other Ambulatory Visit (HOSPITAL_COMMUNITY): Payer: Self-pay

## 2022-11-20 ENCOUNTER — Other Ambulatory Visit (HOSPITAL_BASED_OUTPATIENT_CLINIC_OR_DEPARTMENT_OTHER): Payer: Self-pay

## 2022-11-20 ENCOUNTER — Other Ambulatory Visit (HOSPITAL_COMMUNITY): Payer: Self-pay

## 2022-11-20 ENCOUNTER — Other Ambulatory Visit: Payer: Self-pay | Admitting: Internal Medicine

## 2022-11-21 ENCOUNTER — Other Ambulatory Visit (HOSPITAL_COMMUNITY): Payer: Self-pay

## 2022-11-22 ENCOUNTER — Other Ambulatory Visit (HOSPITAL_BASED_OUTPATIENT_CLINIC_OR_DEPARTMENT_OTHER): Payer: Self-pay

## 2022-11-27 ENCOUNTER — Other Ambulatory Visit (HOSPITAL_BASED_OUTPATIENT_CLINIC_OR_DEPARTMENT_OTHER): Payer: Self-pay

## 2022-11-27 ENCOUNTER — Encounter: Payer: Self-pay | Admitting: Internal Medicine

## 2022-11-27 ENCOUNTER — Ambulatory Visit (INDEPENDENT_AMBULATORY_CARE_PROVIDER_SITE_OTHER): Payer: 59 | Admitting: Internal Medicine

## 2022-11-27 VITALS — BP 160/100 | HR 68 | Ht 74.5 in | Wt 242.0 lb

## 2022-11-27 DIAGNOSIS — E1159 Type 2 diabetes mellitus with other circulatory complications: Secondary | ICD-10-CM

## 2022-11-27 DIAGNOSIS — R200 Anesthesia of skin: Secondary | ICD-10-CM | POA: Diagnosis not present

## 2022-11-27 DIAGNOSIS — E1165 Type 2 diabetes mellitus with hyperglycemia: Secondary | ICD-10-CM

## 2022-11-27 DIAGNOSIS — E782 Mixed hyperlipidemia: Secondary | ICD-10-CM

## 2022-11-27 LAB — POCT GLYCOSYLATED HEMOGLOBIN (HGB A1C): Hemoglobin A1C: 8.2 % — AB (ref 4.0–5.6)

## 2022-11-27 MED ORDER — RYBELSUS 7 MG PO TABS
7.0000 mg | ORAL_TABLET | Freq: Every day | ORAL | 3 refills | Status: DC
  Filled 2022-11-27: qty 30, 30d supply, fill #0

## 2022-11-27 MED ORDER — METFORMIN HCL 500 MG PO TABS
1500.0000 mg | ORAL_TABLET | Freq: Every day | ORAL | 3 refills | Status: DC
  Filled 2022-11-27: qty 270, 90d supply, fill #0
  Filled 2023-01-08 – 2023-02-18 (×2): qty 270, 90d supply, fill #1
  Filled 2023-04-25 – 2023-06-01 (×3): qty 270, 90d supply, fill #2
  Filled 2023-06-01: qty 270, 90d supply, fill #0
  Filled 2023-08-30: qty 270, 90d supply, fill #1

## 2022-11-27 NOTE — Progress Notes (Signed)
Patient ID: Joshua Branch, male   DOB: 05/04/1961, 62 y.o.   MRN: [id number]   HPI: Joshua Branch is a 62 y.o.-year-old male, presenting for follow-up for DM2, dx before 2017, non-insulin-dependent, uncontrolled, with long-term complications (CAD - s/p AMI 05/2018- s/p CABG x4 06/2018, nonhealing skin ulcers >> toe amputations and L BKA).  Last visit 1.5 years ago.  Interim history: No increased urination, blurry vision, nausea, chest pain. Since last visit, he had a left transmetatarsal amputation and then left BKA followed by a revision. He tells me he came off [place] soon after starting it due to nausea and diarrhea.  He would not want to restart it. He ran out of metformin 2 weeks ago as he could not refill it due to not being up-to-date with his office visits. He was on glipizide but ran out, also.  Reviewed his HbA1c levels: Lab Results  Component Value Date   HGBA1C 6.2 (H) 01/04/2022   HGBA1C 7.3 (A) 06/14/2021   HGBA1C 7.4 (A) 01/27/2021   HGBA1C 6.9 (H) 04/15/2020   HGBA1C 7.8 (H) 05/08/2019   HGBA1C 8.2 (H) 06/23/2018   HGBA1C 11.7 (H) 01/17/2018   HGBA1C 10.8 (H) 12/07/2016   HGBA1C 9.2 (H) 11/30/2015   He is on: - Metformin 1000 mg 2x a day, with meals - started 12/2017 >> 500 mg 2x a day b/c felt poorly on the higher dose ("felt like my sugars were crashing") >>  500 mg with b'fast and 1000 mg with dinner - Farxiga 10 mg daily before breakfast  >> nausea and diarrhea >> stopped Previously on Jardiance but this caused abdominal pain, nausea, diarrhea. Previously on Invokana.  Pt checks his sugars once a day:  - am:  120s >> 90s-125 >> 105-120, 165 >> 110-115  >> 125-175 - 2h after b'fast: 130-160 >> 140-145 >> 130-175 >> n/c - before lunch:  - 2h after lunch: n/c - before dinner: n/c - 2h after dinner: up to 175 >> 170-190 >> 175-185 >> 200s - bedtime: n/c - nighttime: n/c Lowest sugar was 107 >> 95 >> 105 >> 110 >> 80; it is unclear at which level he has  hypoglycemia awareness Highest sugar was 181 >> 175 >> 165 >> 185 >> 200.  Glucometer: True Metrix  Pt's meals are: - Breakfast: protein shake, smoothie, yoghurt, occas. Eggs and bacon - Lunch: sandwich, chips - Dinner: chicken, veggie - Snacks: 3 a day He started to change his diet in 02/2018: He eliminated fast foods, eats less sweets, less alcohol.  -No CKD, last BUN/creatinine:  Lab Results  Component Value Date   BUN 20 07/06/2022   BUN 25 (H) 05/25/2022   CREATININE 1.08 07/06/2022   CREATININE 1.03 05/25/2022  On lisinopril 10.  -+ HL; last set of lipids: Lab Results  Component Value Date   CHOL 124 05/01/2021   HDL 35.00 (L) 05/01/2021   LDLCALC 59 04/15/2020   LDLDIRECT 52.0 05/01/2021   TRIG 229.0 (H) 05/01/2021   CHOLHDL 4 05/01/2021  On Lipitor 80.  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 Dr. Severiano Gilbert at [facility].  -+ Numbness and tingling in his feet   On ASA 81.  Pt has FH of DM in mother, sister.  He continues to have numbness in fingers.  B12 was normal: Lab Results  Component Value Date   VITAMINB12 965 (H) 02/12/2022   VITAMINB12 236 05/08/2019   VITAMINB12 642 07/15/2018  We started a  1000 mcg daily B12 supplement.  ROS: + See HPI  I reviewed pt's medications, allergies, PMH, social hx, family hx, and changes were documented in the history of present illness. Otherwise, unchanged from my initial visit note.  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 ([name] 01/18/2021   Hyperlipidemia    Hypertension     Neuropathy of left foot    Nodular basal cell carcinoma (BCC) 07/10/2019   Right V of Neck (curet and 5FU)   Osteomyelitis of third toe of left foot ([name]    Pneumonia    As a child   Right foot ulcer ([name]    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   LIMA to LAD, SVG to Diag, SVG to OM2, SVG to [name], EVH via right thigh and leg   Sleep apnea    Subacute osteomyelitis, left ankle and foot ([name] 05/25/2022   Type II diabetes mellitus with complication, uncontrolled    Uncontrolled type 2 diabetes mellitus    Past Surgical History:  Procedure Laterality Date   AMPUTATION Right 01/06/2020   Procedure: RIGHT FOOT 2ND RAY AMPUTATION;  Surgeon: Nadara Mustard, MD;  Location: [facility] OR;  Service: Orthopedics;  Laterality: Right;   AMPUTATION Left 01/11/2021   Procedure: LEFT FOOT 3RD RAY AMPUTATION;  Surgeon: Nadara Mustard, MD;  Location: [facility] OR;  Service: Orthopedics;  Laterality: Left;   AMPUTATION Left 12/22/2021   Procedure: LEFT TRANSMETATARSAL AMPUTATION;  Surgeon: Nadara Mustard, MD;  Location: [facility] OR;  Service: Orthopedics;  Laterality: Left;   AMPUTATION Left 05/25/2022   Procedure: LEFT FOOT CHOPART;  Surgeon: Nadara Mustard, MD;  Location: [facility] OR;  Service: Orthopedics;  Laterality: Left;   AMPUTATION Left 06/15/2022   Procedure: LEFT BELOW KNEE AMPUTATION;  Surgeon: Nadara Mustard, MD;  Location: [facility] OR;  Service: Orthopedics;  Laterality: Left;   APPLICATION OF WOUND VAC  12/22/2021   Procedure: APPLICATION OF WOUND VAC;  Surgeon: Nadara Mustard, MD;  Location: [facility] OR;  Service: Orthopedics;;   APPLICATION OF WOUND VAC Left 05/25/2022   Procedure: APPLICATION OF WOUND VAC;  Surgeon: Nadara Mustard, MD;  Location: [facility] OR;  Service: Orthopedics;  Laterality: Left;   COLONOSCOPY W/ POLYPECTOMY     CORONARY ARTERY BYPASS GRAFT N/A 06/25/2018   Procedure: CORONARY ARTERY BYPASS GRAFTING (CABG) TIMES FOUR USING LEFT INTERNAL MAMMARY ARTERY AND RIGHT ENDOSCOPICALLY HARVESTED SAPHENOUS VEIN;  Surgeon: Purcell Nails, MD;  Location: [facility] OR;  Service: Open Heart Surgery;  Laterality: N/A;   CYSTOSCOPY WITH STENT PLACEMENT Left 12/04/2021   Procedure: CYSTOSCOPY/RETROGRADE/ STENT PLACEMENT;  Surgeon: Despina Arias, MD;  Location: [facility] ORS;  Service: Urology;  Laterality: Left;   CYSTOSCOPY/URETEROSCOPY/HOLMIUM LASER/STENT PLACEMENT Left 01/05/2022   Procedure: CYSTOSCOPY LEFT URETERAL STENT REMOVAL  LEFT RETROGRADE PYELOGRAM URETEROSCOPY BASKET STONE EXTRACTION STENT PLACEMENT;  Surgeon: Crista Elliot, MD;  Location: [facility] ORS;  Service: Urology;  Laterality: Left;  1 HR FOR CASE   ENDOVEIN HARVEST OF GREATER SAPHENOUS VEIN Right 06/25/2018  Procedure: ENDOVEIN HARVEST OF GREATER SAPHENOUS VEIN;  Surgeon: Purcell Nails, MD;  Location: [facility] OR;  Service: Open Heart Surgery;  Laterality: Right;   EXTRACORPOREAL SHOCK WAVE LITHOTRIPSY Left 12/07/2021   Procedure: EXTRACORPOREAL SHOCK WAVE LITHOTRIPSY (ESWL);  Surgeon: Jerilee Field, MD;  Location: [facility];  Service: Urology;  Laterality: Left;   LEFT HEART CATH AND CORONARY ANGIOGRAPHY N/A 06/22/2018   Procedure: LEFT HEART CATH AND CORONARY ANGIOGRAPHY;  Surgeon: Elder Negus, MD;  Location: [facility] INVASIVE CV LAB;  Service: Cardiovascular;  Laterality: N/A;   SKIN SPLIT GRAFT Left 02/28/2022   Procedure: LEFT FOOT SKIN GRAFT;  Surgeon: Nadara Mustard, MD;  Location: [facility] OR;  Service: Orthopedics;  Laterality: Left;   STUMP REVISION Left 07/06/2022   Procedure: REVISION LEFT BELOW KNEE AMPUTATION  WITH VAC PLACEMENT;  Surgeon: Nadara Mustard, MD;  Location: [facility] OR;  Service: Orthopedics;  Laterality: Left;   STUMP REVISION Left 08/03/2022   Procedure: REVISION LEFT BELOW KNEE AMPUTATION;  Surgeon: Nadara Mustard, MD;  Location: [facility] OR;  Service: Orthopedics;  Laterality: Left;   surgical debridement  Right    Right foot ulcer   TEE WITHOUT CARDIOVERSION N/A 06/25/2018   Procedure: TRANSESOPHAGEAL ECHOCARDIOGRAM (TEE);  Surgeon: Purcell Nails, MD;  Location: [facility] OR;  Service: Open Heart Surgery;  Laterality: N/A;   Social History   Socioeconomic History   Marital status: Divorced    Spouse name: Raynelle Fanning   Number of children: 2   Years of education: Not on file   Highest education level: Professional school degree (e.g., MD, DDS, DVM, JD)  Occupational History   Occupation: attorney  Tobacco Use   Smoking status: Never   Smokeless tobacco: Never  Vaping Use   Vaping Use: Never used  Substance and Sexual Activity   Alcohol use: Yes    Comment: maybe 2 beers a month   Drug use: Never   Sexual activity: Not on file  Other Topics Concern   Not on file  Social History Narrative   Not on file   Social Determinants of Health   Financial Resource Strain: Low Risk  (06/22/2018)   Overall Financial Resource Strain (CARDIA)    Difficulty of Paying Living Expenses: Not hard at all  Food Insecurity: No Food Insecurity (06/15/2022)   Hunger Vital Sign    Worried About Running Out of Food in the Last Year: Never true    Ran Out of Food in the Last Year: Never true  Transportation Needs: No Transportation Needs (06/15/2022)   PRAPARE - Administrator, Civil Service (Medical): No    Lack of Transportation (Non-Medical): No  Physical Activity: Not on file  Stress: Not on file  Social Connections: Not on file  Intimate Partner Violence: Not At Risk (06/15/2022)   Humiliation, Afraid, Rape, and Kick questionnaire    Fear of Current or Ex-Partner: No    Emotionally Abused: No    Physically Abused: No    Sexually Abused: No   Current Outpatient Medications on File Prior to Visit  Medication Sig Dispense Refill   ALPRAZolam (XANAX) 0.5 MG tablet Take 1 tablet (0.5 mg total) by mouth 2 (two) times daily as needed for anxiety. 20 tablet 1   aspirin EC 81 MG tablet Take 1 tablet (81 mg total) by mouth daily. Swallow whole. 120 tablet 0   atorvastatin (LIPITOR) 80 MG tablet Take 1 tablet (80 mg total) by mouth  daily. (Patient taking  differently: Take 80 mg by mouth at bedtime.) 90 tablet 2   doxycycline (VIBRA-TABS) 100 MG tablet Take 1 tablet (100 mg total) by mouth 2 (two) times daily. 60 tablet 0   doxycycline (VIBRA-TABS) 100 MG tablet Take 1 tablet (100 mg total) by mouth 2 (two) times daily. 60 tablet 0   gabapentin (NEURONTIN) 300 MG capsule Take 1 capsule (300 mg total) by mouth at bedtime; up to 3 times a day when necessary for neuropathy pain. 30 capsule 3   glipiZIDE (GLUCOTROL XL) 5 MG 24 hr tablet Take 1 tablet (5 mg total) by mouth daily before breakfast. 90 tablet 3   glucose blood (FREESTYLE LITE) test strip USE AS INSTRUCTED TO CHECK 1-2X A DAY 100 strip 1   hydrochlorothiazide (MICROZIDE) 12.5 MG capsule Take 1 capsule (12.5 mg total) by mouth daily. 30 capsule 0   HYDROmorphone (DILAUDID) 2 MG tablet Take 1 tablet (2 mg total) by mouth every 6 (six) hours as needed for severe pain. 15 tablet 0   Iron, Ferrous Sulfate, 325 (65 Fe) MG TABS Take 1 tablet by mouth everyday 100 tablet 3   Lancets MISC Check sugars 3 times a day E11.9 100 each 0   lisinopril (ZESTRIL) 5 MG tablet Take 1 tablet (5 mg total) by mouth daily. 30 tablet 3   Melatonin 5 MG CAPS Take 5 mg by mouth at bedtime as needed (sleep).     metFORMIN (GLUCOPHAGE) 500 MG tablet Take 3 tablets by mouth daily as advised (Patient taking differently: Take 500 mg by mouth in the morning and at bedtime.) 270 tablet 3   metoprolol tartrate (LOPRESSOR) 25 MG tablet Take 1 tablet (25 mg total) by mouth 2 (two) times daily. 180 tablet 1   Multiple Vitamin (MULTIVITAMIN WITH MINERALS) TABS tablet Take 1 tablet by mouth every evening.     ondansetron (ZOFRAN) 4 MG tablet Take 1 tablet (4 mg total) by mouth every 8 (eight) hours as needed for nausea or vomiting. 20 tablet 0   oxyCODONE-acetaminophen (PERCOCET/ROXICET) 5-325 MG tablet Take 1 tablet by mouth every 4 (four) hours as needed. (Patient taking differently: Take 1 tablet by mouth  every 4 (four) hours as needed for moderate pain.) 20 tablet 0   sildenafil (REVATIO) 20 MG tablet Take 2-5 tablets (40-100 mg total) by mouth 1 hour before sex as needed. **MAX OF 5 TABLETS WITHIN A 24 HOUR PERIOD.** 50 tablet 1   sildenafil (VIAGRA) 50 MG tablet Take 1 tablet (50 mg total) by mouth daily as needed for erectile dysfunction. 10 tablet 0   sulfamethoxazole-trimethoprim (BACTRIM DS) 800-160 MG tablet Take 1 tablet by mouth 2 (two) times daily. 40 tablet 0   tadalafil (CIALIS) 10 MG tablet Take 1 tablet (10 mg total) by mouth every other day as needed for erectile dysfunction. 10 tablet 1   Testosterone 30 MG/ACT SOLN Apply 2 pumps to skin daily 90 mL 1   Valerian 450 MG CAPS Take 450 mg by mouth daily.     Zoster Vaccine Adjuvanted [facility]) injection Inject into the muscle. 0.5 mL 0   No current facility-administered medications on file prior to visit.   Allergies  Allergen Reactions   Apricot Kernel Oil [Prunus] Swelling    THROAT   Cherry Swelling    THROAT   Jardiance [Empagliflozin] Diarrhea and Nausea And Vomiting   Other Other (See Comments)    Fruits with a pit. Throat swells up. Can have them if cooked  Peach [Prunus Persica] Swelling    THROAT   Plum Pulp Swelling    THROAT   Family History  Problem Relation Age of Onset   Diabetes Mother    Stroke Father    Hypertension Father    Hearing loss Father    Colon polyps Father    Colon cancer Neg Hx    Esophageal cancer Neg Hx    Prostate cancer Neg Hx    PE: BP (!) 160/100 (BP Location: Right Arm, Patient Position: Sitting, Cuff Size: Normal)   Pulse 68   Ht 6' 2.5" (1.892 m)   Wt 242 lb (109.8 kg)   SpO2 96%   BMI 30.66 kg/m  Wt Readings from Last 3 Encounters:  11/27/22 242 lb (109.8 kg)  08/03/22 211 lb 10.3 oz (96 kg)  07/06/22 238 lb 8.6 oz (108.2 kg)   Constitutional: overweight, in NAD Eyes:  EOMI, no exophthalmos ENT: no neck masses, no cervical lymphadenopathy Cardiovascular: RRR,  No MRG Respiratory: CTA B Musculoskeletal: + deformities (L BKA, R 2nd toe amputation) Skin:no rashes Neurological: no tremor with outstretched hands  ASSESSMENT: 1. DM2, non-insulin-dependent, uncontrolled, with long-term complications - CAD - s/p AMI - s/p CABG x4 06/2018-sees Dr. Allyson Sabal - right foot second ray amputation 12/2019 - left foot third ray amputation 12/2020 - left transmetatarsal amputation 11/2021 - left BKA 05/2022 - revision left BKA 06/2022  2.  Numbness in fingers  3. HL  PLAN:  1. Patient with longstanding, uncontrolled DM2 - returning after a long absence of 1.5 years. At last OV, I advised him to increase the Metformin dose with dinner as his sugars were higher after this meal. HbA1c at last OB was 7.3%, but lower in 12/2021: 6.2%. -At today's visit, he tells me that he came off [place] due to nausea and diarrhea.  We discussed that the typical symptoms of Farxiga, but he did notice an improvement in his symptoms as soon as he stopped it.  He did well on metformin only, with an HbA1c of 6.2% last summer.  However, afterwards, he relaxed his diet especially around the time of his amputations and started to gain weight, with worsening of his sugars.  Moreover, he ran out of metformin and could not refill it due to being lost to follow-up since last visit.  He was on glipizide during this period of time, but ran out of this, also. -At today's visit, he tells me that he is aware that he needs to improve his diet and he agrees with a referral to nutrition.  I also ordered this at last visit but he did not see the nutritionist at that time. -We also discussed about restarting metformin and sent a prescription to his pharmacy.  I also recommended Rybelsus and discussed about the mechanism of action, benefits, and possible side effects.  If she cannot start this or tolerate this, we may need to go back to glipizide. - I suggested to:  Patient Instructions  Please continue: -  Metformin 500 mg with b'fast and 1000 mg with dinner  Please start: -  Rybelsus 3.5 mg before breakfast and increase to 7 mg daily.  Please schedule an appt with Oran Rein with nutrition.  Please return in 3-4 months.  - we checked his HbA1c: 8.2% (higher) - advised to check sugars at different times of the day - 1x a day, rotating check times - advised for yearly eye exams >> he is not UTD - return to clinic in 4  months  2.  Numbness in fingertips -He had a low normal B12 level  - started on supplementation -He continues 1000 mcg B12 daily  - now followed by PCP  3. HL -Reviewed latest lipid panel from 04/2021: LDL at goal, triglycerides high, HDL low: Lab Results  Component Value Date   CHOL 124 05/01/2021   HDL 35.00 (L) 05/01/2021   LDLCALC 59 04/15/2020   LDLDIRECT 52.0 05/01/2021   TRIG 229.0 (H) 05/01/2021   CHOLHDL 4 05/01/2021  -He continues Lipitor 80 mg daily without side effects -He is due for another lipid panel -will check at next visit if not checked by PCP until then  Carlus Pavlov, MD PhD [facility] Endocrinology

## 2022-11-27 NOTE — Patient Instructions (Addendum)
Please continue: - Metformin 500 mg with b'fast and 1000 mg with dinner  Please start: -  Rybelsus 3.5 mg before breakfast and increase to 7 mg daily.  Please schedule an appt with Oran Rein with nutrition.  Please return in 3-4 months.

## 2022-11-28 ENCOUNTER — Telehealth: Payer: Self-pay

## 2022-11-28 NOTE — Telephone Encounter (Signed)
Pharmacy Patient Advocate Encounter  Prior Authorization has been APPROVED Effective dates: 11/28/22 through 11/28/23

## 2022-11-28 NOTE — Telephone Encounter (Signed)
Patient Advocate Encounter   Received notification from FAX that prior authorization is required for Rybelsus  Submitted: 11/28/22 Key [id number]  Status is pending

## 2022-11-29 ENCOUNTER — Other Ambulatory Visit (HOSPITAL_BASED_OUTPATIENT_CLINIC_OR_DEPARTMENT_OTHER): Payer: Self-pay

## 2022-11-29 ENCOUNTER — Encounter: Payer: 59 | Admitting: Physical Therapy

## 2022-12-05 ENCOUNTER — Encounter: Payer: Self-pay | Admitting: Internal Medicine

## 2022-12-05 ENCOUNTER — Encounter: Payer: Self-pay | Admitting: Orthopedic Surgery

## 2022-12-05 ENCOUNTER — Other Ambulatory Visit: Payer: Self-pay

## 2022-12-06 ENCOUNTER — Ambulatory Visit (INDEPENDENT_AMBULATORY_CARE_PROVIDER_SITE_OTHER): Payer: 59 | Admitting: Orthopedic Surgery

## 2022-12-06 DIAGNOSIS — Z89512 Acquired absence of left leg below knee: Secondary | ICD-10-CM

## 2022-12-07 ENCOUNTER — Encounter: Payer: Self-pay | Admitting: Orthopedic Surgery

## 2022-12-07 NOTE — Progress Notes (Signed)
Office Visit Note   [name]           Date of Birth: Jan 14, 1961           MRN: [id number] Visit Date: 12/06/2022              Requested by: Esperanza Richters, PA-C [id number] [place],  [place] [address] PCP: Esperanza Richters, PA-C  Chief Complaint  Patient presents with   Left Leg - Follow-up    08/03/2022 revision left BKA       HPI: Uneven terrain. Patient is a 62 year old gentleman who is status post left transtibial amputation.  Patient states he noticed a sore from pressure within the socket.  Patient states he has been out of the prosthetic socket for 2 weeks.  Assessment & Plan: Visit Diagnoses:  1. S/P BKA (below knee amputation), left ([name]     Plan: Patient will follow-up with [facility] to see if padding in the socket would help with his symptoms.  Patient is a high demand K3 level ambulator and will need an advanced foot and ankle for his activities on  Follow-Up Instructions: Return in about 4 weeks (around 01/03/2023).   Ortho Exam  Patient is alert, oriented, no adenopathy, well-dressed, normal affect, normal respiratory effort. Examination the end bearing ulcer is healing well.  Patient initially had an ulcer that was 3 x 4 cm now there is only an open wound of 3 mm.  Patient is quite active and is required to ambulate on a hill uneven terrain [place]  Patient will need an advanced K3 level foot and ankle.  Patient is an existing left transtibial  amputee.  Patient's current comorbidities are not expected to impact the ability to function with the prescribed prosthesis. Patient verbally communicates a strong desire to use a prosthesis. Patient currently requires mobility aids to ambulate without a prosthesis.  Expects not to use mobility aids with a new prosthesis.  Patient is a K3 level ambulator that spends a lot of time walking around on uneven terrain over obstacles, up and down stairs, and  ambulates with a variable cadence.     Imaging: No results found.   Labs: Lab Results  Component Value Date   HGBA1C 8.2 (A) 11/27/2022   HGBA1C 6.2 (H) 01/04/2022   HGBA1C 7.3 (A) 06/14/2021   ESRSEDRATE 36 (H) 02/12/2022   REPTSTATUS 08/08/2022 FINAL 08/03/2022   GRAMSTAIN  08/03/2022    RARE WBC PRESENT, PREDOMINANTLY PMN RARE GRAM POSITIVE COCCI INTRACELLULAR    CULT  08/03/2022    FEW METHICILLIN RESISTANT STAPHYLOCOCCUS AUREUS FEW CORYNEBACTERIUM STRIATUM Standardized susceptibility testing for this organism is not available. NO ANAEROBES ISOLATED Performed at [facility], [address] [place], [place] [address]    LABORGA METHICILLIN RESISTANT STAPHYLOCOCCUS AUREUS 08/03/2022     Lab Results  Component Value Date   ALBUMIN 3.3 (L) 07/06/2022   ALBUMIN 3.8 02/12/2022   ALBUMIN 4.3 05/01/2021   PREALBUMIN 17 (L) 07/06/2022    Lab Results  Component Value Date   MG 2.1 05/08/2019   MG 2.2 06/26/2018   MG 3.0 (H) 06/26/2018   Lab Results  Component Value Date   VD25OH 30.56 07/06/2022    Lab Results  Component Value Date   PREALBUMIN 17 (L) 07/06/2022      Latest Ref Rng & Units 07/06/2022   12:28 PM 05/25/2022    6:38 AM 02/12/2022  3:04 PM  CBC EXTENDED  WBC 4.0 - 10.5 K/uL 4.4  6.3  9.0   RBC 4.22 - 5.81 MIL/uL 4.18  3.92  3.97   Hemoglobin 13.0 - 17.0 g/dL 16.1  09.6  04.5   HCT 39.0 - 52.0 % 36.7  33.4  36.4   Platelets 150 - 400 K/uL 345  371  325.0   NEUT# 1.7 - 7.7 K/uL 1.9   5.9   Lymph# 0.7 - 4.0 K/uL 1.1   2.1      There is no height or weight on file to calculate BMI.  Orders:  No orders of the defined types were placed in this encounter.  No orders of the defined types were placed in this encounter.    Procedures: No procedures performed  Clinical Data: No additional findings.  ROS:  All other systems negative, except as noted in the HPI. Review of Systems  Objective: Vital Signs: There were no vitals  taken for this visit.  Specialty Comments:  No specialty comments available.  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   Vertigo 08/20/2016   Myringotomy tube status 08/20/2016   Ear fullness, left 08/20/2016   Asymmetrical left sensorineural hearing loss 02/06/2016   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   Cramping of hands 06/02/2014   Eustachian tube dysfunction 06/02/2014   HTN (hypertension) 05/07/2014   Obstructive sleep apnea 05/07/2014   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 ([name] 01/18/2021   Hyperlipidemia    Hypertension    Neuropathy of left foot    Nodular basal cell carcinoma (BCC) 07/10/2019   Right V of Neck (curet and 5FU)   Osteomyelitis of third toe of left foot ([name]    Pneumonia    As a child   Right foot ulcer ([name]    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   LIMA to LAD, SVG to Diag, SVG to OM2, SVG to [name], EVH via right thigh and leg   Sleep apnea    Subacute osteomyelitis, left ankle and foot ([name] 05/25/2022   Type II diabetes mellitus with complication,  uncontrolled    Uncontrolled type 2 diabetes mellitus     Family History  Problem Relation Age of Onset   Diabetes Mother    Stroke Father    Hypertension Father    Hearing loss Father    Colon polyps Father    Colon cancer Neg Hx    Esophageal cancer Neg Hx    Prostate cancer Neg Hx     Past Surgical History:  Procedure Laterality Date   AMPUTATION Right 01/06/2020   Procedure: RIGHT FOOT 2ND RAY AMPUTATION;  Surgeon: Nadara Mustard, MD;  Location: [facility] OR;  Service: Orthopedics;  Laterality: Right;   AMPUTATION Left 01/11/2021   Procedure: LEFT FOOT 3RD RAY AMPUTATION;  Surgeon: Nadara Mustard, MD;  Location: [facility] OR;  Service: Orthopedics;  Laterality: Left;   AMPUTATION Left 12/22/2021   Procedure: LEFT TRANSMETATARSAL AMPUTATION;  Surgeon: Nadara Mustard, MD;  Location: [facility] OR;  Service: Orthopedics;  Laterality: Left;   AMPUTATION Left 05/25/2022   Procedure: LEFT FOOT CHOPART;  Surgeon: Nadara Mustard, MD;  Location: [facility] OR;  Service: Orthopedics;  Laterality: Left;   AMPUTATION Left 06/15/2022   Procedure: LEFT BELOW KNEE AMPUTATION;  Surgeon: Nadara Mustard, MD;  Location: [facility] OR;  Service: Orthopedics;  Laterality: Left;   APPLICATION OF WOUND VAC  12/22/2021   Procedure: APPLICATION OF WOUND VAC;  Surgeon: Nadara Mustard, MD;  Location: [facility] OR;  Service: Orthopedics;;   APPLICATION OF WOUND VAC Left 05/25/2022   Procedure: APPLICATION OF WOUND VAC;  Surgeon: Nadara Mustard, MD;  Location: [facility] OR;  Service: Orthopedics;  Laterality: Left;   COLONOSCOPY W/ POLYPECTOMY     CORONARY ARTERY BYPASS GRAFT N/A 06/25/2018   Procedure: CORONARY ARTERY BYPASS GRAFTING (CABG) TIMES FOUR USING LEFT INTERNAL MAMMARY ARTERY AND RIGHT ENDOSCOPICALLY HARVESTED SAPHENOUS VEIN;  Surgeon: Purcell Nails, MD;  Location: [facility] OR;  Service: Open Heart Surgery;  Laterality: N/A;   CYSTOSCOPY WITH STENT PLACEMENT Left 12/04/2021   Procedure: CYSTOSCOPY/RETROGRADE/ STENT PLACEMENT;  Surgeon: Despina Arias,  MD;  Location: [facility] ORS;  Service: Urology;  Laterality: Left;   CYSTOSCOPY/URETEROSCOPY/HOLMIUM LASER/STENT PLACEMENT Left 01/05/2022   Procedure: CYSTOSCOPY LEFT URETERAL STENT REMOVAL  LEFT RETROGRADE PYELOGRAM URETEROSCOPY BASKET STONE EXTRACTION STENT PLACEMENT;  Surgeon: Crista Elliot, MD;  Location: [facility] ORS;  Service: Urology;  Laterality: Left;  1 HR FOR CASE   ENDOVEIN HARVEST OF GREATER SAPHENOUS VEIN Right 06/25/2018   Procedure: ENDOVEIN HARVEST OF GREATER SAPHENOUS VEIN;  Surgeon: Purcell Nails, MD;  Location: [facility] OR;  Service: Open Heart Surgery;  Laterality: Right;   EXTRACORPOREAL SHOCK WAVE LITHOTRIPSY Left 12/07/2021   Procedure: EXTRACORPOREAL SHOCK WAVE LITHOTRIPSY (ESWL);  Surgeon: Jerilee Field, MD;  Location: [facility];  Service: Urology;  Laterality: Left;   LEFT HEART CATH AND CORONARY ANGIOGRAPHY N/A 06/22/2018   Procedure: LEFT HEART CATH AND CORONARY ANGIOGRAPHY;  Surgeon: Elder Negus, MD;  Location: [facility];  Service: Cardiovascular;  Laterality: N/A;   SKIN SPLIT GRAFT Left 02/28/2022   Procedure: LEFT FOOT SKIN GRAFT;  Surgeon: Nadara Mustard, MD;  Location: [facility] OR;  Service: Orthopedics;  Laterality: Left;   STUMP REVISION Left 07/06/2022   Procedure: REVISION LEFT BELOW KNEE AMPUTATION  WITH VAC PLACEMENT;  Surgeon: Nadara Mustard, MD;  Location: [facility] OR;  Service: Orthopedics;  Laterality: Left;   STUMP REVISION Left 08/03/2022   Procedure: REVISION LEFT BELOW KNEE AMPUTATION;  Surgeon: Nadara Mustard, MD;  Location: [facility] OR;  Service: Orthopedics;  Laterality: Left;   surgical debridement  Right    Right foot ulcer   TEE WITHOUT CARDIOVERSION N/A 06/25/2018   Procedure: TRANSESOPHAGEAL ECHOCARDIOGRAM (TEE);  Surgeon: Purcell Nails, MD;  Location: [facility] OR;  Service: Open Heart Surgery;  Laterality: N/A;   Social History   Occupational History   Occupation: attorney  Tobacco Use   Smoking status: Never  Smokeless tobacco:  Never  Vaping Use   Vaping Use: Never used  Substance and Sexual Activity   Alcohol use: Yes    Comment: maybe 2 beers a month   Drug use: Never   Sexual activity: Not on file

## 2022-12-10 ENCOUNTER — Other Ambulatory Visit: Payer: Self-pay

## 2022-12-10 ENCOUNTER — Other Ambulatory Visit (HOSPITAL_BASED_OUTPATIENT_CLINIC_OR_DEPARTMENT_OTHER): Payer: Self-pay

## 2023-01-07 ENCOUNTER — Ambulatory Visit (INDEPENDENT_AMBULATORY_CARE_PROVIDER_SITE_OTHER): Payer: 59 | Admitting: Orthopedic Surgery

## 2023-01-07 DIAGNOSIS — Z89512 Acquired absence of left leg below knee: Secondary | ICD-10-CM

## 2023-01-08 ENCOUNTER — Other Ambulatory Visit (HOSPITAL_COMMUNITY): Payer: Self-pay

## 2023-01-08 ENCOUNTER — Other Ambulatory Visit: Payer: Self-pay | Admitting: Medical

## 2023-01-08 ENCOUNTER — Other Ambulatory Visit: Payer: Self-pay

## 2023-01-08 DIAGNOSIS — N528 Other male erectile dysfunction: Secondary | ICD-10-CM

## 2023-01-08 MED ORDER — HYDROCHLOROTHIAZIDE 12.5 MG PO CAPS
12.5000 mg | ORAL_CAPSULE | Freq: Every day | ORAL | 0 refills | Status: DC
  Filled 2023-01-08: qty 30, 30d supply, fill #0

## 2023-01-08 MED ORDER — SILDENAFIL CITRATE 20 MG PO TABS
40.0000 mg | ORAL_TABLET | Freq: Every day | ORAL | 1 refills | Status: DC | PRN
Start: 2023-01-08 — End: 2023-07-25
  Filled 2023-01-08: qty 50, 10d supply, fill #0
  Filled 2023-02-18: qty 50, 10d supply, fill #1

## 2023-01-17 ENCOUNTER — Other Ambulatory Visit (HOSPITAL_COMMUNITY): Payer: Self-pay

## 2023-01-17 ENCOUNTER — Other Ambulatory Visit: Payer: Self-pay

## 2023-01-17 ENCOUNTER — Ambulatory Visit: Payer: 59 | Attending: Cardiovascular Disease | Admitting: Cardiovascular Disease

## 2023-01-17 ENCOUNTER — Encounter: Payer: Self-pay | Admitting: Cardiovascular Disease

## 2023-01-17 VITALS — BP 120/78 | HR 80 | Ht 74.5 in | Wt 246.4 lb

## 2023-01-17 DIAGNOSIS — E782 Mixed hyperlipidemia: Secondary | ICD-10-CM

## 2023-01-17 DIAGNOSIS — I2510 Atherosclerotic heart disease of native coronary artery without angina pectoris: Secondary | ICD-10-CM | POA: Diagnosis not present

## 2023-01-17 DIAGNOSIS — Z951 Presence of aortocoronary bypass graft: Secondary | ICD-10-CM | POA: Diagnosis not present

## 2023-01-17 DIAGNOSIS — I10 Essential (primary) hypertension: Secondary | ICD-10-CM

## 2023-01-17 DIAGNOSIS — I25110 Atherosclerotic heart disease of native coronary artery with unstable angina pectoris: Secondary | ICD-10-CM | POA: Diagnosis not present

## 2023-01-17 NOTE — Assessment & Plan Note (Signed)
History of essential hypertension blood pressure measured today at 120/78.  He is on hydrochlorothiazide, lisinopril and metoprolol.

## 2023-01-17 NOTE — Assessment & Plan Note (Signed)
History of hyperlipidemia on statin therapy followed by his PCP 

## 2023-01-17 NOTE — Assessment & Plan Note (Signed)
History of CAD status post non-STEMI 06/18/2018.  He underwent cardiac catheterization by Dr. Darrold Junker revealing three-vessel disease.  He ultimately underwent CABG x 4 by Dr. Cornelius Moras  with excellent result.  He is completely asymptomatic with regards to this.

## 2023-01-17 NOTE — Progress Notes (Signed)
01/17/2023 JOSHUADAVID ARRITT   1961-05-11  [id number]  Primary Physician Saguier, Kateri Mc Primary Cardiologist: Runell Gess MD Nicholes Calamity, [place]  HPI:  Joshua Branch is a 62 y.o.    moderately overweight divorced Caucasian male father of 2 children who is self-referred to be established in my practice for ongoing cardiovascular care.  I last saw saw him in the office 12/05/2021. He is a Engineer, production.  His ex wife is an occupational therapist at [employer].  His primary provider is [facility] in [place].  His risk factors include treated diabetes, hypertension and hyperlipidemia his mother did have a myocardial infarction.  He presented on 06/22/2018 with non-STEMI and cath performed radially by Dr. Priscella Mann revealed three-vessel disease.  His EF was normal though he did have apical wall motion abnormality.  3 days later he underwent coronary artery bypass grafting x4 by Dr. Cornelius Moras with an excellent result.  He recuperated nicely.   Since I saw him in the office a year ago he unfortunately had a left BKA performed by Dr. Lajoyce Corners for nonhealing ulcer (diabetic) 08/03/2022.  He currently has a prosthesis.  He denies chest pain or shortness of breath.  He has gained some weight since I saw him probably related to inactivity.  His hemoglobin A1c is running in the low 8 range.   Current Meds  Medication Sig   ALPRAZolam (XANAX) 0.5 MG tablet Take 1 tablet (0.5 mg total) by mouth 2 (two) times daily as needed for anxiety.   aspirin EC 81 MG tablet Take 1 tablet (81 mg total) by mouth daily. Swallow whole.   atorvastatin (LIPITOR) 80 MG tablet Take 1 tablet (80 mg total) by mouth daily. (Patient taking differently: Take 80 mg by mouth at bedtime.)   gabapentin (NEURONTIN) 300 MG capsule Take 1 capsule (300 mg total) by mouth at bedtime; up to 3 times a day when necessary for neuropathy pain.   glucose blood (FREESTYLE LITE) test strip USE AS INSTRUCTED TO CHECK 1-2X A  DAY   hydrochlorothiazide (MICROZIDE) 12.5 MG capsule Take 1 capsule (12.5 mg total) by mouth daily.   Iron, Ferrous Sulfate, 325 (65 Fe) MG TABS Take 1 tablet by mouth everyday   Lancets MISC Check sugars 3 times a day E11.9   lisinopril (ZESTRIL) 5 MG tablet Take 1 tablet (5 mg total) by mouth daily.   Melatonin 5 MG CAPS Take 5 mg by mouth at bedtime as needed (sleep).   metFORMIN (GLUCOPHAGE) 500 MG tablet Take 3 tablets (1,500 mg total) by mouth daily as advised.   metoprolol tartrate (LOPRESSOR) 25 MG tablet Take 1 tablet (25 mg total) by mouth 2 (two) times daily.   Multiple Vitamin (MULTIVITAMIN WITH MINERALS) TABS tablet Take 1 tablet by mouth every evening.   ondansetron (ZOFRAN) 4 MG tablet Take 1 tablet (4 mg total) by mouth every 8 (eight) hours as needed for nausea or vomiting.   Semaglutide (RYBELSUS) 7 MG TABS Take 1 tablet (7 mg total) by mouth daily.   sildenafil (REVATIO) 20 MG tablet Take 2-5 tablets (40-100 mg total) by mouth 1 hour before sex as needed. **MAX OF 5 TABLETS WITHIN A 24 HOUR PERIOD.**   sildenafil (VIAGRA) 50 MG tablet Take 1 tablet (50 mg total) by mouth daily as needed for erectile dysfunction.   tadalafil (CIALIS) 10 MG tablet Take 1 tablet (10 mg total) by mouth every other day as needed for erectile dysfunction.  Testosterone 30 MG/ACT SOLN Apply 2 pumps to skin daily   Valerian 450 MG CAPS Take 450 mg by mouth daily.   Zoster Vaccine Adjuvanted [facility]) injection Inject into the muscle.     Allergies  Allergen Reactions   Apricot Kernel Oil [Prunus] Swelling    THROAT   Cherry Swelling    THROAT   Jardiance [Empagliflozin] Diarrhea and Nausea And Vomiting   Other Other (See Comments)    Fruits with a pit. Throat swells up. Can have them if cooked    Peach [Prunus Persica] Swelling    THROAT   Plum Pulp Swelling    THROAT    Social History   Socioeconomic History   Marital status: Divorced    Spouse name: Raynelle Fanning   Number of children: 2    Years of education: Not on file   Highest education level: Professional school degree (e.g., MD, DDS, DVM, JD)  Occupational History   Occupation: attorney  Tobacco Use   Smoking status: Never   Smokeless tobacco: Never  Vaping Use   Vaping Use: Never used  Substance and Sexual Activity   Alcohol use: Yes    Comment: maybe 2 beers a month   Drug use: Never   Sexual activity: Not on file  Other Topics Concern   Not on file  Social History Narrative   Not on file   Social Determinants of Health   Financial Resource Strain: Low Risk  (06/22/2018)   Overall Financial Resource Strain (CARDIA)    Difficulty of Paying Living Expenses: Not hard at all  Food Insecurity: No Food Insecurity (06/15/2022)   Hunger Vital Sign    Worried About Running Out of Food in the Last Year: Never true    Ran Out of Food in the Last Year: Never true  Transportation Needs: No Transportation Needs (06/15/2022)   PRAPARE - Administrator, Civil Service (Medical): No    Lack of Transportation (Non-Medical): No  Physical Activity: Not on file  Stress: Not on file  Social Connections: Not on file  Intimate Partner Violence: Not At Risk (06/15/2022)   Humiliation, Afraid, Rape, and Kick questionnaire    Fear of Current or Ex-Partner: No    Emotionally Abused: No    Physically Abused: No    Sexually Abused: No     Review of Systems: General: negative for chills, fever, night sweats or weight changes.  Cardiovascular: negative for chest pain, dyspnea on exertion, edema, orthopnea, palpitations, paroxysmal nocturnal dyspnea or shortness of breath Dermatological: negative for rash Respiratory: negative for cough or wheezing Urologic: negative for hematuria Abdominal: negative for nausea, vomiting, diarrhea, bright red blood per rectum, melena, or hematemesis Neurologic: negative for visual changes, syncope, or dizziness All other systems reviewed and are otherwise negative except as noted  above.    Blood pressure 120/78, pulse 80, height 6' 2.5" (1.892 m), weight 246 lb 6.4 oz (111.8 kg).  General appearance: alert and no distress Neck: no adenopathy, no carotid bruit, no JVD, supple, symmetrical, trachea midline, and thyroid not enlarged, symmetric, no tenderness/mass/nodules Lungs: clear to auscultation bilaterally Heart: regular rate and rhythm, S1, S2 normal, no murmur, click, rub or gallop Extremities: Left BKA Pulses: Status post left BKA Skin: Skin color, texture, turgor normal. No rashes or lesions Neurologic: Grossly normal  EKG EKG Interpretation  Date/Time:  Thursday January 17 2023 15:06:49 EDT Ventricular Rate:  80 PR Interval:  162 QRS Duration: 136 QT Interval:  372 QTC Calculation: 429  R Axis:   -31 Text Interpretation: Normal sinus rhythm Left axis deviation Non-specific intra-ventricular conduction block Minimal voltage criteria for LVH, may be normal variant ( Cornell product ) Possible Lateral infarct , age undetermined When compared with ECG of 11-Jan-2021 10:14, QRS duration has increased Borderline criteria for Lateral infarct are now Present Confirmed by Nanetta Batty [phone]) on 01/17/2023 3:42:02 PM    ASSESSMENT AND PLAN:   HTN (hypertension) History of essential hypertension blood pressure measured today at 120/78.  He is on hydrochlorothiazide, lisinopril and metoprolol.  Hyperlipidemia History of hyperlipidemia on statin therapy followed by his PCP.   Coronary artery disease involving native coronary artery with unstable angina pectoris ([name] History of CAD status post non-STEMI 06/18/2018.  He underwent cardiac catheterization by Dr. Darrold Junker revealing three-vessel disease.  He ultimately underwent CABG x 4 by Dr. Cornelius Moras  with excellent result.  He is completely asymptomatic with regards to this.     Runell Gess MD FACP,FACC,FAHA, [facility] 01/17/2023 4:03 PM

## 2023-01-17 NOTE — Patient Instructions (Signed)
Medication Instructions:  Your physician recommends that you continue on your current medications as directed. Please refer to the Current Medication list given to you today.  *If you need a refill on your cardiac medications before your next appointment, please call your pharmacy*   Lab Work: NONE If you have labs (blood work) drawn today and your tests are completely normal, you will receive your results only by: MyChart Message (if you have MyChart) OR A paper copy in the mail If you have any lab test that is abnormal or we need to change your treatment, we will call you to review the results.   Testing/Procedures: NONE   Follow-Up: At [facility], you and your health needs are our priority.  As part of our continuing mission to provide you with exceptional heart care, we have created designated Provider Care Teams.  These Care Teams include your primary Cardiologist (physician) and Advanced Practice Providers (APPs -  Physician Assistants and Nurse Practitioners) who all work together to provide you with the care you need, when you need it.  We recommend signing up for the patient portal called "MyChart".  Sign up information is provided on this After Visit Summary.  MyChart is used to connect with patients for Virtual Visits (Telemedicine).  Patients are able to view lab/test results, encounter notes, upcoming appointments, etc.  Non-urgent messages can be sent to your provider as well.   To learn more about what you can do with MyChart, go to [email or web address].    Your next appointment:   1 year(s)  Provider:   DR. Allyson Sabal

## 2023-01-21 ENCOUNTER — Other Ambulatory Visit: Payer: Self-pay

## 2023-01-21 ENCOUNTER — Other Ambulatory Visit (HOSPITAL_COMMUNITY): Payer: Self-pay

## 2023-01-22 ENCOUNTER — Encounter: Payer: Self-pay | Admitting: Orthopedic Surgery

## 2023-01-22 NOTE — Progress Notes (Signed)
Office Visit Note   [name]           Date of Birth: [birth year]           MRN: [id number] Visit Date: 01/07/2023              Requested by: Esperanza Richters, PA-C [id number] [place],  [place] [address] PCP: Esperanza Richters, PA-C  Chief Complaint  Patient presents with   Left Leg - Follow-up    08/03/2022 revision left BKA       HPI: Patient is a 62 year old gentleman who is seen status post revision of the left below-knee amputation January 5.  Patient has follow-up with [facility] for socket modification.  Patient is currently wearing a prosthetic under liner and he states that this feels great.  Assessment & Plan: Visit Diagnoses:  1. S/P BKA (below knee amputation), left ([name]     Plan: Continue with the prosthetic under liner.  Patient states he does not need therapy at this time.  Follow-up with [facility] as needed.  Follow-Up Instructions: No follow-ups on file.   Ortho Exam  Patient is alert, oriented, no adenopathy, well-dressed, normal affect, normal respiratory effort. Examination patient does have a rash with dermatitis on the residual limb.  He states that this has improved with wearing the prosthetic under liner.  There are no open ulcers no drainage.  Imaging: No results found. No images are attached to the encounter.  Labs: Lab Results  Component Value Date   HGBA1C 8.2 (A) 11/27/2022   HGBA1C 6.2 (H) 01/04/2022   HGBA1C 7.3 (A) 06/14/2021   ESRSEDRATE 36 (H) 02/12/2022   REPTSTATUS 08/08/2022 FINAL 08/03/2022   GRAMSTAIN  08/03/2022    RARE WBC PRESENT, PREDOMINANTLY PMN RARE GRAM POSITIVE COCCI INTRACELLULAR    CULT  08/03/2022    FEW METHICILLIN RESISTANT STAPHYLOCOCCUS AUREUS FEW CORYNEBACTERIUM STRIATUM Standardized susceptibility testing for this organism is not available. NO ANAEROBES ISOLATED Performed at [facility], [address] [place], [place] [address]    LABORGA METHICILLIN RESISTANT  STAPHYLOCOCCUS AUREUS 08/03/2022     Lab Results  Component Value Date   ALBUMIN 3.3 (L) 07/06/2022   ALBUMIN 3.8 02/12/2022   ALBUMIN 4.3 05/01/2021   PREALBUMIN 17 (L) 07/06/2022    Lab Results  Component Value Date   MG 2.1 05/08/2019   MG 2.2 06/26/2018   MG 3.0 (H) 06/26/2018   Lab Results  Component Value Date   VD25OH 30.56 07/06/2022    Lab Results  Component Value Date   PREALBUMIN 17 (L) 07/06/2022      Latest Ref Rng & Units 07/06/2022   12:28 PM 05/25/2022    6:38 AM 02/12/2022    3:04 PM  CBC EXTENDED  WBC 4.0 - 10.5 K/uL 4.4  6.3  9.0   RBC 4.22 - 5.81 MIL/uL 4.18  3.92  3.97   Hemoglobin 13.0 - 17.0 g/dL 95.2  84.1  32.4   HCT 39.0 - 52.0 % 36.7  33.4  36.4   Platelets 150 - 400 K/uL 345  371  325.0   NEUT# 1.7 - 7.7 K/uL 1.9   5.9   Lymph# 0.7 - 4.0 K/uL 1.1   2.1      There is no height or weight on file to calculate BMI.  Orders:  No orders of the defined types were placed in this encounter.  No orders of the defined types were placed in this encounter.  Procedures: No procedures performed  Clinical Data: No additional findings.  ROS:  All other systems negative, except as noted in the HPI. Review of Systems  Objective: Vital Signs: There were no vitals taken for this visit.  Specialty Comments:  No specialty comments available.  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   Vertigo 08/20/2016   Myringotomy tube status  08/20/2016   Ear fullness, left 08/20/2016   Asymmetrical left sensorineural hearing loss 02/06/2016   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   Cramping of hands 06/02/2014   Eustachian tube dysfunction 06/02/2014   HTN (hypertension) 05/07/2014   Obstructive sleep apnea 05/07/2014   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 ([name] 01/18/2021   Hyperlipidemia    Hypertension    Neuropathy of left foot    Nodular basal cell carcinoma (BCC) 07/10/2019   Right V of Neck (curet and 5FU)   Osteomyelitis of third toe of left foot ([name]    Pneumonia    As a child   Right foot ulcer ([name]    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   LIMA to LAD, SVG to Diag, SVG to OM2, SVG to [name], EVH via right thigh and leg   Sleep apnea    Subacute osteomyelitis, left ankle and foot ([name] 05/25/2022   Type II diabetes mellitus with complication, uncontrolled    Uncontrolled type 2 diabetes mellitus     Family History  Problem Relation Age of Onset   Diabetes Mother    Stroke Father    Hypertension Father    Hearing loss Father    Colon polyps Father    Colon cancer Neg Hx    Esophageal cancer Neg Hx    Prostate cancer Neg Hx     Past Surgical History:  Procedure Laterality Date   AMPUTATION Right 01/06/2020   Procedure: RIGHT FOOT 2ND RAY AMPUTATION;  Surgeon: Nadara Mustard, MD;  Location: [facility] OR;  Service: Orthopedics;  Laterality: Right;   AMPUTATION Left 01/11/2021   Procedure: LEFT FOOT 3RD RAY AMPUTATION;  Surgeon: Nadara Mustard, MD;  Location: [facility] OR;  Service: Orthopedics;  Laterality: Left;   AMPUTATION  Left 12/22/2021   Procedure: LEFT TRANSMETATARSAL AMPUTATION;  Surgeon: Nadara Mustard, MD;  Location: [facility] OR;  Service: Orthopedics;  Laterality: Left;   AMPUTATION Left 05/25/2022   Procedure: LEFT FOOT CHOPART;  Surgeon: Nadara Mustard, MD;  Location: [facility] OR;  Service: Orthopedics;  Laterality: Left;   AMPUTATION Left 06/15/2022   Procedure: LEFT BELOW KNEE AMPUTATION;  Surgeon: Lajoyce Corners,  Randa Evens, MD;  Location: [facility] OR;  Service: Orthopedics;  Laterality: Left;   APPLICATION OF WOUND VAC  12/22/2021   Procedure: APPLICATION OF WOUND VAC;  Surgeon: Nadara Mustard, MD;  Location: [facility] OR;  Service: Orthopedics;;   APPLICATION OF WOUND VAC Left 05/25/2022   Procedure: APPLICATION OF WOUND VAC;  Surgeon: Nadara Mustard, MD;  Location: [facility] OR;  Service: Orthopedics;  Laterality: Left;   COLONOSCOPY W/ POLYPECTOMY     CORONARY ARTERY BYPASS GRAFT N/A 06/25/2018   Procedure: CORONARY ARTERY BYPASS GRAFTING (CABG) TIMES FOUR USING LEFT INTERNAL MAMMARY ARTERY AND RIGHT ENDOSCOPICALLY HARVESTED SAPHENOUS VEIN;  Surgeon: Purcell Nails, MD;  Location: [facility] OR;  Service: Open Heart Surgery;  Laterality: N/A;   CYSTOSCOPY WITH STENT PLACEMENT Left 12/04/2021   Procedure: CYSTOSCOPY/RETROGRADE/ STENT PLACEMENT;  Surgeon: Despina Arias, MD;  Location: [facility] ORS;  Service: Urology;  Laterality: Left;   CYSTOSCOPY/URETEROSCOPY/HOLMIUM LASER/STENT PLACEMENT Left 01/05/2022   Procedure: CYSTOSCOPY LEFT URETERAL STENT REMOVAL  LEFT RETROGRADE PYELOGRAM URETEROSCOPY BASKET STONE EXTRACTION STENT PLACEMENT;  Surgeon: Crista Elliot, MD;  Location: [facility] ORS;  Service: Urology;  Laterality: Left;  1 HR FOR CASE   ENDOVEIN HARVEST OF GREATER SAPHENOUS VEIN Right 06/25/2018   Procedure: ENDOVEIN HARVEST OF GREATER SAPHENOUS VEIN;  Surgeon: Purcell Nails, MD;  Location: [facility] OR;  Service: Open Heart Surgery;  Laterality: Right;   EXTRACORPOREAL SHOCK WAVE LITHOTRIPSY Left 12/07/2021   Procedure: EXTRACORPOREAL SHOCK WAVE  LITHOTRIPSY (ESWL);  Surgeon: Jerilee Field, MD;  Location: [facility];  Service: Urology;  Laterality: Left;   LEFT HEART CATH AND CORONARY ANGIOGRAPHY N/A 06/22/2018   Procedure: LEFT HEART CATH AND CORONARY ANGIOGRAPHY;  Surgeon: Elder Negus, MD;  Location: [facility] LAB;  Service: Cardiovascular;  Laterality: N/A;   SKIN SPLIT GRAFT Left 02/28/2022   Procedure: LEFT FOOT SKIN GRAFT;  Surgeon: Nadara Mustard, MD;  Location: [facility] OR;  Service: Orthopedics;  Laterality: Left;   STUMP REVISION Left 07/06/2022   Procedure: REVISION LEFT BELOW KNEE AMPUTATION  WITH VAC PLACEMENT;  Surgeon: Nadara Mustard, MD;  Location: [facility] OR;  Service: Orthopedics;  Laterality: Left;   STUMP REVISION Left 08/03/2022   Procedure: REVISION LEFT BELOW KNEE AMPUTATION;  Surgeon: Nadara Mustard, MD;  Location: [facility] OR;  Service: Orthopedics;  Laterality: Left;   surgical debridement  Right    Right foot ulcer   TEE WITHOUT CARDIOVERSION N/A 06/25/2018   Procedure: TRANSESOPHAGEAL ECHOCARDIOGRAM (TEE);  Surgeon: Purcell Nails, MD;  Location: [facility] OR;  Service: Open Heart Surgery;  Laterality: N/A;   Social History   Occupational History   Occupation: attorney  Tobacco Use   Smoking status: Never   Smokeless tobacco: Never  Vaping Use   Vaping Use: Never used  Substance and Sexual Activity   Alcohol use: Yes    Comment: maybe 2 beers a month   Drug use: Never   Sexual activity: Not on file

## 2023-01-28 ENCOUNTER — Encounter: Payer: Self-pay | Admitting: Skilled Nursing Facility1

## 2023-01-28 ENCOUNTER — Encounter: Payer: 59 | Attending: Internal Medicine | Admitting: Skilled Nursing Facility1

## 2023-01-28 DIAGNOSIS — E119 Type 2 diabetes mellitus without complications: Secondary | ICD-10-CM | POA: Insufficient documentation

## 2023-01-28 NOTE — Progress Notes (Signed)
Allergies: if these fruits are cooked he is okay Apricot Cherry Peaches  Plum  Pt states peanuts do not sit well with him.   DM Meds: Metformin  Other Dx: GERD  OSA Cancer HTN Hyperlipidemia MI Amputation below the knee    Pt states he wants better health and to talk about the diet pertaining to that.  Pt states Rybelsus gave him too much nausea and vomiting.   Pt states he checks his blood sugar about once a week fasting: 125 Pt states his prosthesis is just now feeling okay stating he has plans to increase his physical activity.   Pt states he lives with his 64 year old dog.  Pt states he swims once a week.   Pt states he feels he got what he needed in today's appt but will call if he feels he would like another appt in the future.   Diabetes Self-Management Education  Visit Type: First/Initial   01/28/2023  Joshua Branch, identified by name and date of birth, is a 62 y.o. male with a diagnosis of Diabetes: Type 2.   ASSESSMENT  There were no vitals taken for this visit. There is no height or weight on file to calculate BMI.   Diabetes Self-Management Education - 01/28/23 1121       Visit Information   Visit Type First/Initial      Initial Visit   Diabetes Type Type 2    Date Diagnosed 5 years ago    Are you currently following a meal plan? No    Are you taking your medications as prescribed? Yes      Health Coping   How would you rate your overall health? Fair;Good      Psychosocial Assessment   Patient Belief/Attitude about Diabetes Motivated to manage diabetes    What is the hardest part about your diabetes right now, causing you the most concern, or is the most worrisome to you about your diabetes?   Making healty food and beverage choices    Self-care barriers None    Self-management support Friends;Family    Patient Concerns Nutrition/Meal planning    Special Needs None    Preferred Learning Style Visual;Hands on;Auditory    Learning  Readiness Contemplating    How often do you need to have someone help you when you read instructions, pamphlets, or other written materials from your doctor or pharmacy? 1 - Never      Pre-Education Assessment   Patient understands the diabetes disease and treatment process. Needs Instruction    Patient understands incorporating nutritional management into lifestyle. Needs Instruction    Patient undertands incorporating physical activity into lifestyle. Needs Instruction    Patient understands using medications safely. Needs Instruction    Patient understands monitoring blood glucose, interpreting and using results Needs Instruction    Patient understands prevention, detection, and treatment of acute complications. Needs Instruction    Patient understands prevention, detection, and treatment of chronic complications. Needs Instruction    Patient understands how to develop strategies to address psychosocial issues. Needs Instruction    Patient understands how to develop strategies to promote health/change behavior. Needs Instruction      Complications   Last HgB A1C per patient/outside source 8.2 %    How often do you check your blood sugar? 3-4 times / week    Fasting Blood glucose range (mg/dL) 16-109    Number of hypoglycemic episodes per month 0    Number of hyperglycemic episodes ( >200mg /dL): Never  Can you tell when your blood sugar is high? No    Have you had a dilated eye exam in the past 12 months? Yes    Have you had a dental exam in the past 12 months? Yes    Are you checking your feet? Yes    How many days per week are you checking your feet? 7      Dietary Intake   Breakfast protein shake    Lunch chinese food (75% of lunches eaten out)    Dinner steak and zucchini and onions (butter and salt) and half baked potato + butter    Beverage(s) 48 oz water, 2-3 sodas a week, 2 drinks a week      Activity / Exercise   Activity / Exercise Type Light (walking / raking leaves)     How many days per week do you exercise? 1    How many minutes per day do you exercise? 30    Total minutes per week of exercise 30      Patient Education   Previous Diabetes Education No    Disease Pathophysiology Definition of diabetes, type 1 and 2, and the diagnosis of diabetes;Factors that contribute to the development of diabetes    Healthy Eating Role of diet in the treatment of diabetes and the relationship between the three main macronutrients and blood glucose level;Reviewed blood glucose goals for pre and post meals and how to evaluate the patients' food intake on their blood glucose level.;Carbohydrate counting;Information on hints to eating out and maintain blood glucose control.;Meal options for control of blood glucose level and chronic complications.    Being Active Role of exercise on diabetes management, blood pressure control and cardiac health.;Helped patient identify appropriate exercises in relation to his/her diabetes, diabetes complications and other health issue.    Monitoring Taught/evaluated SMBG meter.;Daily foot exams;Yearly dilated eye exam    Acute complications Taught prevention, symptoms, and  treatment of hypoglycemia - the 15 rule.;Discussed and identified patients' prevention, symptoms, and treatment of hyperglycemia.    Chronic complications Dental care;Retinopathy and reason for yearly dilated eye exams;Nephropathy, what it is, prevention of, the use of ACE, ARB's and early detection of through urine microalbumia.;Lipid levels, blood glucose control and heart disease;Relationship between chronic complications and blood glucose control    Diabetes Stress and Support Role of stress on diabetes      Individualized Goals (developed by patient)   Nutrition General guidelines for healthy choices and portions discussed;Follow meal plan discussed    Physical Activity Exercise 5-7 days per week;30 minutes per day    Monitoring  Test my blood glucose as discussed;Test  blood glucose pre and post meals as discussed    Problem Solving Eating Pattern    Reducing Risk do foot checks daily;treat hypoglycemia with 15 grams of carbs if blood glucose less than 70mg /dL      Post-Education Assessment   Patient understands the diabetes disease and treatment process. Demonstrates understanding / competency    Patient understands incorporating nutritional management into lifestyle. Demonstrates understanding / competency    Patient undertands incorporating physical activity into lifestyle. Demonstrates understanding / competency    Patient understands using medications safely. Demonstrates understanding / competency    Patient understands monitoring blood glucose, interpreting and using results Demonstrates understanding / competency    Patient understands prevention, detection, and treatment of acute complications. Demonstrates understanding / competency    Patient understands prevention, detection, and treatment of chronic complications. Demonstrates understanding / competency  Patient understands how to develop strategies to address psychosocial issues. Demonstrates understanding / competency    Patient understands how to develop strategies to promote health/change behavior. Demonstrates understanding / competency      Outcomes   Expected Outcomes Demonstrated interest in learning. Expect positive outcomes    Future DMSE PRN    Program Status Completed             Individualized Plan for Diabetes Self-Management Training:   Learning Objective:  Patient will have a greater understanding of diabetes self-management. Patient education plan is to attend individual and/or group sessions per assessed needs and concerns.    Expected Outcomes:  Demonstrated interest in learning. Expect positive outcomes  Education material provided: ADA - How to Thrive: A Guide for Your Journey with Diabetes, Meal plan card, and My Plate  If problems or questions, patient to  contact team via:  Phone and Email  Future DSME appointment: PRN

## 2023-02-05 ENCOUNTER — Other Ambulatory Visit (HOSPITAL_COMMUNITY): Payer: Self-pay

## 2023-02-08 ENCOUNTER — Other Ambulatory Visit (HOSPITAL_COMMUNITY): Payer: Self-pay

## 2023-02-11 ENCOUNTER — Other Ambulatory Visit (HOSPITAL_COMMUNITY): Payer: Self-pay

## 2023-02-15 ENCOUNTER — Other Ambulatory Visit (HOSPITAL_BASED_OUTPATIENT_CLINIC_OR_DEPARTMENT_OTHER): Payer: Self-pay

## 2023-02-18 ENCOUNTER — Other Ambulatory Visit (HOSPITAL_COMMUNITY): Payer: Self-pay

## 2023-02-18 ENCOUNTER — Other Ambulatory Visit: Payer: Self-pay | Admitting: Cardiovascular Disease

## 2023-02-18 ENCOUNTER — Other Ambulatory Visit: Payer: Self-pay | Admitting: Medical

## 2023-02-18 ENCOUNTER — Other Ambulatory Visit: Payer: Self-pay | Admitting: Family

## 2023-02-18 ENCOUNTER — Other Ambulatory Visit: Payer: Self-pay

## 2023-02-18 MED ORDER — ATORVASTATIN CALCIUM 80 MG PO TABS
80.0000 mg | ORAL_TABLET | Freq: Every day | ORAL | 2 refills | Status: DC
  Filled 2023-02-18: qty 90, 90d supply, fill #0
  Filled 2023-05-30 – 2023-06-01 (×3): qty 90, 90d supply, fill #1
  Filled 2023-08-04 – 2023-08-30 (×2): qty 90, 90d supply, fill #2

## 2023-02-18 MED ORDER — GABAPENTIN 300 MG PO CAPS
300.0000 mg | ORAL_CAPSULE | Freq: Every day | ORAL | 3 refills | Status: DC
  Filled 2023-02-18: qty 30, 30d supply, fill #0
  Filled 2023-03-29: qty 30, 30d supply, fill #1
  Filled 2023-04-25: qty 30, 30d supply, fill #2
  Filled 2023-05-30 – 2023-06-01 (×3): qty 30, 30d supply, fill #3

## 2023-02-25 ENCOUNTER — Other Ambulatory Visit: Payer: Self-pay | Admitting: Family

## 2023-02-25 ENCOUNTER — Other Ambulatory Visit: Payer: Self-pay | Admitting: Medical

## 2023-02-25 ENCOUNTER — Other Ambulatory Visit: Payer: Self-pay | Admitting: Cardiovascular Disease

## 2023-02-25 ENCOUNTER — Other Ambulatory Visit (HOSPITAL_COMMUNITY): Payer: Self-pay

## 2023-02-26 ENCOUNTER — Other Ambulatory Visit: Payer: Self-pay

## 2023-02-26 ENCOUNTER — Other Ambulatory Visit (HOSPITAL_BASED_OUTPATIENT_CLINIC_OR_DEPARTMENT_OTHER): Payer: Self-pay

## 2023-02-26 ENCOUNTER — Encounter: Payer: Self-pay | Admitting: Pharmacist

## 2023-02-26 MED ORDER — ALPRAZOLAM 0.5 MG PO TABS
0.5000 mg | ORAL_TABLET | Freq: Two times a day (BID) | ORAL | 1 refills | Status: DC | PRN
  Filled 2023-02-26: qty 20, 10d supply, fill #0
  Filled 2023-04-05: qty 20, 10d supply, fill #1

## 2023-02-27 ENCOUNTER — Other Ambulatory Visit (HOSPITAL_COMMUNITY): Payer: Self-pay

## 2023-02-27 ENCOUNTER — Other Ambulatory Visit (HOSPITAL_BASED_OUTPATIENT_CLINIC_OR_DEPARTMENT_OTHER): Payer: Self-pay

## 2023-02-27 MED ORDER — METOPROLOL TARTRATE 25 MG PO TABS
25.0000 mg | ORAL_TABLET | Freq: Two times a day (BID) | ORAL | 1 refills | Status: DC
  Filled 2023-02-27 – 2023-04-25 (×2): qty 180, 90d supply, fill #0
  Filled 2023-05-30 – 2023-11-04 (×2): qty 180, 90d supply, fill #1

## 2023-03-29 ENCOUNTER — Other Ambulatory Visit: Payer: Self-pay

## 2023-03-29 ENCOUNTER — Other Ambulatory Visit: Payer: Self-pay | Admitting: Medical

## 2023-03-29 ENCOUNTER — Other Ambulatory Visit (HOSPITAL_BASED_OUTPATIENT_CLINIC_OR_DEPARTMENT_OTHER): Payer: Self-pay

## 2023-03-29 MED ORDER — HYDROCHLOROTHIAZIDE 12.5 MG PO CAPS
12.5000 mg | ORAL_CAPSULE | Freq: Every day | ORAL | 0 refills | Status: DC
  Filled 2023-03-29: qty 30, 30d supply, fill #0

## 2023-03-29 MED ORDER — LISINOPRIL 5 MG PO TABS
5.0000 mg | ORAL_TABLET | Freq: Every day | ORAL | 3 refills | Status: DC
  Filled 2023-03-29: qty 90, 90d supply, fill #0
  Filled 2023-04-25 – 2023-07-06 (×3): qty 30, 30d supply, fill #1

## 2023-03-29 MED ORDER — IRON (FERROUS SULFATE) 325 (65 FE) MG PO TABS
325.0000 mg | ORAL_TABLET | Freq: Every day | ORAL | 3 refills | Status: AC
  Filled 2023-03-29: qty 100, 100d supply, fill #0

## 2023-04-02 ENCOUNTER — Other Ambulatory Visit (HOSPITAL_BASED_OUTPATIENT_CLINIC_OR_DEPARTMENT_OTHER): Payer: Self-pay

## 2023-04-03 ENCOUNTER — Ambulatory Visit: Payer: 59 | Admitting: Internal Medicine

## 2023-04-05 ENCOUNTER — Other Ambulatory Visit: Payer: Self-pay

## 2023-04-05 ENCOUNTER — Other Ambulatory Visit (HOSPITAL_BASED_OUTPATIENT_CLINIC_OR_DEPARTMENT_OTHER): Payer: Self-pay

## 2023-04-10 ENCOUNTER — Other Ambulatory Visit (HOSPITAL_BASED_OUTPATIENT_CLINIC_OR_DEPARTMENT_OTHER): Payer: Self-pay

## 2023-04-15 ENCOUNTER — Other Ambulatory Visit (HOSPITAL_BASED_OUTPATIENT_CLINIC_OR_DEPARTMENT_OTHER): Payer: Self-pay

## 2023-04-15 MED ORDER — TESTOSTERONE 30 MG/ACT TD SOLN
2.0000 mL | Freq: Every day | TRANSDERMAL | 1 refills | Status: DC
  Filled 2023-04-15 (×2): qty 90, 30d supply, fill #0
  Filled 2023-04-15: qty 90, 45d supply, fill #0
  Filled 2023-04-25 – 2023-06-21 (×3): qty 90, 30d supply, fill #1

## 2023-04-16 ENCOUNTER — Other Ambulatory Visit (HOSPITAL_BASED_OUTPATIENT_CLINIC_OR_DEPARTMENT_OTHER): Payer: Self-pay

## 2023-04-25 ENCOUNTER — Other Ambulatory Visit: Payer: Self-pay

## 2023-04-25 ENCOUNTER — Other Ambulatory Visit: Payer: Self-pay | Admitting: Medical

## 2023-04-25 ENCOUNTER — Other Ambulatory Visit (HOSPITAL_BASED_OUTPATIENT_CLINIC_OR_DEPARTMENT_OTHER): Payer: Self-pay

## 2023-04-25 MED ORDER — HYDROCHLOROTHIAZIDE 12.5 MG PO CAPS
12.5000 mg | ORAL_CAPSULE | Freq: Every day | ORAL | 0 refills | Status: DC
  Filled 2023-04-25: qty 30, 30d supply, fill #0

## 2023-04-29 ENCOUNTER — Other Ambulatory Visit (HOSPITAL_COMMUNITY): Payer: Self-pay

## 2023-05-10 ENCOUNTER — Ambulatory Visit (INDEPENDENT_AMBULATORY_CARE_PROVIDER_SITE_OTHER): Payer: 59 | Admitting: Internal Medicine

## 2023-05-10 ENCOUNTER — Encounter: Payer: Self-pay | Admitting: Internal Medicine

## 2023-05-10 VITALS — BP 138/80 | HR 76 | Ht 74.5 in | Wt 250.8 lb

## 2023-05-10 DIAGNOSIS — Z7984 Long term (current) use of oral hypoglycemic drugs: Secondary | ICD-10-CM | POA: Diagnosis not present

## 2023-05-10 DIAGNOSIS — E1165 Type 2 diabetes mellitus with hyperglycemia: Secondary | ICD-10-CM | POA: Diagnosis not present

## 2023-05-10 DIAGNOSIS — E66811 Obesity, class 1: Secondary | ICD-10-CM

## 2023-05-10 DIAGNOSIS — E1159 Type 2 diabetes mellitus with other circulatory complications: Secondary | ICD-10-CM

## 2023-05-10 DIAGNOSIS — E782 Mixed hyperlipidemia: Secondary | ICD-10-CM

## 2023-05-10 LAB — COMPREHENSIVE METABOLIC PANEL
ALT: 19 U/L (ref 0–53)
AST: 17 U/L (ref 0–37)
Albumin: 4.1 g/dL (ref 3.5–5.2)
Alkaline Phosphatase: 96 U/L (ref 39–117)
BUN: 20 mg/dL (ref 6–23)
CO2: 31 meq/L (ref 19–32)
Calcium: 9.6 mg/dL (ref 8.4–10.5)
Chloride: 101 meq/L (ref 96–112)
Creatinine, Ser: 0.98 mg/dL (ref 0.40–1.50)
GFR: 83.66 mL/min (ref >60.00)
Glucose, Bld: 191 mg/dL — ABNORMAL HIGH (ref 70–99)
Potassium: 4.8 meq/L (ref 3.5–5.1)
Sodium: 139 meq/L (ref 135–145)
Total Bilirubin: 0.6 mg/dL (ref 0.2–1.2)
Total Protein: 6.3 g/dL (ref 6.0–8.3)

## 2023-05-10 LAB — POCT GLYCOSYLATED HEMOGLOBIN (HGB A1C): Hemoglobin A1C: 7.6 % — AB (ref 4.0–5.6)

## 2023-05-10 LAB — LIPID PANEL
Cholesterol: 114 mg/dL (ref 0–200)
HDL: 33.9 mg/dL — ABNORMAL LOW (ref >39.00)
LDL Cholesterol: 52 mg/dL (ref 0–99)
NonHDL: 80.31
Total CHOL/HDL Ratio: 3
Triglycerides: 144 mg/dL (ref 0.0–149.0)
VLDL: 28.8 mg/dL (ref 0.0–40.0)

## 2023-05-10 LAB — MICROALBUMIN / CREATININE URINE RATIO
Creatinine,U: 172.2 mg/dL
Microalb Creat Ratio: 1 mg/g (ref 0.0–30.0)
Microalb, Ur: 1.7 mg/dL (ref 0.0–1.9)

## 2023-05-10 NOTE — Patient Instructions (Addendum)
Please continue: - Metformin 1000 mg with b'fast and 500 mg with dinner  Please check blood sugars 1x aday, rotating check times and let me know about them in 1 month.  [facility]  Please return in 3-4 months.

## 2023-05-10 NOTE — Addendum Note (Signed)
Addended by: Pollie Meyer on: 05/10/2023 01:21 PM   Modules accepted: Orders

## 2023-05-10 NOTE — Progress Notes (Signed)
Patient ID: Joshua Branch, male   DOB: 05-Jul-1961, 62 y.o.   MRN: [id number]   HPI: Joshua Branch is a 62 y.o.-year-old male, presenting for follow-up for DM2, dx before 2017, non-insulin-dependent, uncontrolled, with long-term complications (CAD - s/p AMI 05/2018- s/p CABG x4 06/2018, nonhealing skin ulcers >> toe amputations and L BKA).  Last visit 5 months ago.  Interim history: No increased urination, blurry vision, nausea, chest pain. He gained 8 lbs since last OV. Before last visit, he had a left transmetatarsal amputation and then left BKA followed by a revision. He started to adjust diet and started to exercise more.  Reviewed his HbA1c levels: Lab Results  Component Value Date   HGBA1C 8.2 (A) 11/27/2022   HGBA1C 6.2 (H) 01/04/2022   HGBA1C 7.3 (A) 06/14/2021   HGBA1C 7.4 (A) 01/27/2021   HGBA1C 6.9 (H) 04/15/2020   HGBA1C 7.8 (H) 05/08/2019   HGBA1C 8.2 (H) 06/23/2018   HGBA1C 11.7 (H) 01/17/2018   HGBA1C 10.8 (H) 12/07/2016   HGBA1C 9.2 (H) 11/30/2015   He is on: - Metformin 1000 mg 2x a day, with meals - started 12/2017 >> 500 mg 2x a day b/c felt poorly on the higher dose ("felt like my sugars were crashing") >>  500 mg with b'fast and 1000 mg with dinner >> 1000 mg in am and 500 mg with dinner -  >> stopped 2/2 N and D Previously on Jardiance but this caused abdominal pain, nausea, diarrhea. Also developed nausea and diarrhea from [place].  He would not want to restart this. Previously on Invokana. He was previously on glipizide but ran out.  Pt checks his sugars once a day:  - am: 105-120, 165 >> 110-115  >> 125-175 >> 110-125 - 2h after b'fast: 130-160 >> 140-145 >> 130-175 >> n/c - before lunch: n/c - 2h after lunch: n/c - before dinner: n/c - 2h after dinner: 170-190 >> 175-185 >> 200s >> 180-205 - bedtime: n/c - nighttime: n/c Lowest sugar was 110 >> 80 >> 110; it is unclear at which level he has hypoglycemia awareness Highest sugar was 185 >> 200 >>  205  Glucometer: True Metrix  Pt's meals are: - Breakfast: protein shake, smoothie, yoghurt, occas. Eggs and bacon - Lunch: sandwich, chips - Dinner: chicken, veggie - Snacks: 3 a day He started to change his diet in 02/2018: He eliminated fast foods, eats less sweets, less alcohol.  -No CKD, last BUN/creatinine:  Lab Results  Component Value Date   BUN 20 07/06/2022   BUN 25 (H) 05/25/2022   CREATININE 1.08 07/06/2022   CREATININE 1.03 05/25/2022  No results found for: "MICRALBCREAT" On lisinopril 10.  -+ HL; last set of lipids: Lab Results  Component Value Date   CHOL 124 05/01/2021   HDL 35.00 (L) 05/01/2021   LDLCALC 59 04/15/2020   LDLDIRECT 52.0 05/01/2021   TRIG 229.0 (H) 05/01/2021   CHOLHDL 4 05/01/2021  On Lipitor 80.  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 Dr. Severiano Gilbert at [facility].  -+ Numbness and tingling in his feet  We previously started 1000 mcg B12 daily.  Currently followed by PCP.  On ASA 81.  Pt has FH of DM in mother, sister.  ROS: + See HPI  I reviewed pt's medications, allergies, PMH, social hx, family hx, and changes were documented in the history of present illness. Otherwise, unchanged from my initial visit note.  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 ([name] 01/18/2021   Hyperlipidemia    Hypertension    Neuropathy of left foot    Nodular basal cell carcinoma (BCC) 07/10/2019   Right V of Neck (curet and 5FU)   Osteomyelitis of third toe of left foot ([name]    Pneumonia    As a child   Right  foot ulcer ([name]    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   LIMA to LAD, SVG to Diag, SVG to OM2, SVG to [name], EVH via right thigh and leg   Sleep apnea    Subacute osteomyelitis, left ankle and foot ([name] 05/25/2022   Type II diabetes mellitus with complication, uncontrolled    Uncontrolled type 2 diabetes mellitus    Past Surgical History:  Procedure Laterality Date   AMPUTATION Right 01/06/2020   Procedure: RIGHT FOOT 2ND RAY AMPUTATION;  Surgeon: Nadara Mustard, MD;  Location: [facility] OR;  Service: Orthopedics;  Laterality: Right;   AMPUTATION Left 01/11/2021   Procedure: LEFT FOOT 3RD RAY AMPUTATION;  Surgeon: Nadara Mustard, MD;  Location: [facility] OR;  Service: Orthopedics;  Laterality: Left;   AMPUTATION Left 12/22/2021   Procedure: LEFT TRANSMETATARSAL AMPUTATION;  Surgeon: Nadara Mustard, MD;  Location: [facility] OR;  Service: Orthopedics;  Laterality: Left;   AMPUTATION Left 05/25/2022   Procedure: LEFT FOOT CHOPART;  Surgeon: Nadara Mustard, MD;  Location: [facility] OR;  Service: Orthopedics;  Laterality: Left;   AMPUTATION Left 06/15/2022   Procedure: LEFT BELOW KNEE AMPUTATION;  Surgeon: Nadara Mustard, MD;  Location: [facility] OR;  Service: Orthopedics;  Laterality: Left;   APPLICATION OF WOUND VAC  12/22/2021   Procedure: APPLICATION OF WOUND VAC;  Surgeon: Nadara Mustard, MD;  Location[facility] OR;  Service: Orthopedics;;   APPLICATION OF WOUND VAC Left 05/25/2022   Procedure: APPLICATION OF WOUND VAC;  Surgeon: Nadara Mustard, MD;  Location: [facility] OR;  Service: Orthopedics;  Laterality: Left;   COLONOSCOPY W/ POLYPECTOMY     CORONARY ARTERY BYPASS GRAFT N/A 06/25/2018   Procedure: CORONARY ARTERY BYPASS GRAFTING (CABG) TIMES FOUR USING LEFT INTERNAL MAMMARY ARTERY AND RIGHT ENDOSCOPICALLY HARVESTED SAPHENOUS VEIN;  Surgeon: Purcell Nails, MD;  Location: [facility] OR;  Service: Open Heart Surgery;  Laterality: N/A;   CYSTOSCOPY WITH STENT PLACEMENT Left 12/04/2021   Procedure: CYSTOSCOPY/RETROGRADE/ STENT PLACEMENT;  Surgeon: Despina Arias, MD;  Location: [facility] ORS;  Service: Urology;  Laterality: Left;   CYSTOSCOPY/URETEROSCOPY/HOLMIUM LASER/STENT PLACEMENT Left 01/05/2022   Procedure: CYSTOSCOPY LEFT URETERAL STENT REMOVAL  LEFT RETROGRADE PYELOGRAM URETEROSCOPY BASKET STONE EXTRACTION STENT PLACEMENT;  Surgeon: Crista Elliot, MD;  Location: [facility] ORS;  Service: Urology;  Laterality: Left;  1 HR FOR CASE   ENDOVEIN HARVEST OF GREATER SAPHENOUS VEIN Right 06/25/2018   Procedure: ENDOVEIN HARVEST OF GREATER SAPHENOUS VEIN;  Surgeon: Purcell Nails, MD;  Location: [facility] OR;  Service: Open Heart Surgery;  Laterality: Right;   EXTRACORPOREAL SHOCK WAVE LITHOTRIPSY Left 12/07/2021   Procedure: EXTRACORPOREAL SHOCK WAVE LITHOTRIPSY (ESWL);  Surgeon:  Jerilee Field, MD;  Location: [facility];  Service: Urology;  Laterality: Left;   LEFT HEART CATH AND CORONARY ANGIOGRAPHY N/A 06/22/2018   Procedure: LEFT HEART CATH AND CORONARY ANGIOGRAPHY;  Surgeon: Elder Negus, MD;  Location: [facility];  Service: Cardiovascular;  Laterality: N/A;   SKIN SPLIT GRAFT Left 02/28/2022   Procedure: LEFT FOOT SKIN GRAFT;  Surgeon: Nadara Mustard, MD;  Location: [facility] OR;  Service: Orthopedics;  Laterality: Left;   STUMP REVISION Left 07/06/2022   Procedure: REVISION LEFT BELOW KNEE AMPUTATION  WITH VAC PLACEMENT;  Surgeon: Nadara Mustard, MD;  Location: [facility] OR;  Service: Orthopedics;  Laterality: Left;   STUMP REVISION Left 08/03/2022   Procedure: REVISION LEFT BELOW KNEE AMPUTATION;  Surgeon: Nadara Mustard, MD;  Location: [facility] OR;  Service: Orthopedics;  Laterality: Left;   surgical debridement  Right    Right foot ulcer   TEE WITHOUT CARDIOVERSION N/A 06/25/2018   Procedure: TRANSESOPHAGEAL ECHOCARDIOGRAM (TEE);  Surgeon: Purcell Nails, MD;  Location: [facility] OR;  Service: Open Heart Surgery;  Laterality: N/A;   Social History   Socioeconomic History   Marital status: Divorced    Spouse name: Raynelle Fanning   Number of children: 2    Years of education: Not on file   Highest education level: Professional school degree (e.g., MD, DDS, DVM, JD)  Occupational History   Occupation: attorney  Tobacco Use   Smoking status: Never   Smokeless tobacco: Never  Vaping Use   Vaping status: Never Used  Substance and Sexual Activity   Alcohol use: Yes    Comment: maybe 2 beers a month   Drug use: Never   Sexual activity: Not on file  Other Topics Concern   Not on file  Social History Narrative   Not on file   Social Determinants of Health   Financial Resource Strain: Low Risk  (06/22/2018)   Overall Financial Resource Strain (CARDIA)    Difficulty of Paying Living Expenses: Not hard at all  Food Insecurity: No Food Insecurity (06/15/2022)   Hunger Vital Sign    Worried About Running Out of Food in the Last Year: Never true    Ran Out of Food in the Last Year: Never true  Transportation Needs: No Transportation Needs (06/15/2022)   PRAPARE - Administrator, Civil Service (Medical): No    Lack of Transportation (Non-Medical): No  Physical Activity: Not on file  Stress: Not on file  Social Connections: Not on file  Intimate Partner Violence: Not At Risk (06/15/2022)   Humiliation, Afraid, Rape, and Kick questionnaire    Fear of Current or Ex-Partner: No    Emotionally Abused: No    Physically Abused: No    Sexually Abused: No   Current Outpatient Medications on File Prior to Visit  Medication Sig Dispense Refill   ALPRAZolam (XANAX) 0.5 MG tablet Take 1 tablet (0.5 mg total) by mouth 2 (two) times daily as needed for anxiety. 20 tablet 1   aspirin EC 81 MG tablet Take 1 tablet (81 mg total) by mouth daily. Swallow whole. 120 tablet 0   atorvastatin (LIPITOR) 80 MG tablet Take 1 tablet (80 mg total) by mouth daily. 90 tablet 2   gabapentin (NEURONTIN) 300 MG capsule Take 1 capsule (300 mg total) by mouth at bedtime; up to 3 times a day when necessary for neuropathy pain. 30 capsule 3   glucose blood  (FREESTYLE LITE) test strip USE AS INSTRUCTED  TO CHECK 1-2X A DAY 100 strip 1   hydrochlorothiazide (MICROZIDE) 12.5 MG capsule Take 1 capsule (12.5 mg total) by mouth daily. 30 capsule 0   Iron, Ferrous Sulfate, 325 (65 Fe) MG TABS Take 1 tablet by mouth everyday 100 tablet 3   Lancets MISC Check sugars 3 times a day E11.9 100 each 0   lisinopril (ZESTRIL) 5 MG tablet Take 1 tablet (5 mg total) by mouth daily. 30 tablet 3   Melatonin 5 MG CAPS Take 5 mg by mouth at bedtime as needed (sleep).     metFORMIN (GLUCOPHAGE) 500 MG tablet Take 3 tablets (1,500 mg total) by mouth daily as advised. 270 tablet 3   metoprolol tartrate (LOPRESSOR) 25 MG tablet Take 1 tablet (25 mg total) by mouth 2 (two) times daily. 180 tablet 1   Multiple Vitamin (MULTIVITAMIN WITH MINERALS) TABS tablet Take 1 tablet by mouth every evening.     ondansetron (ZOFRAN) 4 MG tablet Take 1 tablet (4 mg total) by mouth every 8 (eight) hours as needed for nausea or vomiting. 20 tablet 0   Semaglutide (RYBELSUS) 7 MG TABS Take 1 tablet (7 mg total) by mouth daily. 90 tablet 3   sildenafil (REVATIO) 20 MG tablet Take 2-5 tablets (40-100 mg total) by mouth 1 hour before sex as needed. **MAX OF 5 TABLETS WITHIN A 24 HOUR PERIOD.** 50 tablet 1   sildenafil (VIAGRA) 50 MG tablet Take 1 tablet (50 mg total) by mouth daily as needed for erectile dysfunction. 10 tablet 0   tadalafil (CIALIS) 10 MG tablet Take 1 tablet (10 mg total) by mouth every other day as needed for erectile dysfunction. 10 tablet 1   Testosterone 30 MG/ACT SOLN Apply 2 pumps to skin daily 90 mL 1   Testosterone 30 MG/ACT SOLN Apply 2 pumps topically daily as directed 90 mL 1   Valerian 450 MG CAPS Take 450 mg by mouth daily.     Zoster Vaccine Adjuvanted [facility]) injection Inject into the muscle. 0.5 mL 0   No current facility-administered medications on file prior to visit.   Allergies  Allergen Reactions   Apricot Kernel Oil [Prunus] Swelling    THROAT    Cherry Swelling    THROAT   Jardiance [Empagliflozin] Diarrhea and Nausea And Vomiting   Other Other (See Comments)    Fruits with a pit. Throat swells up. Can have them if cooked    Peach [Prunus Persica] Swelling    THROAT   Plum Pulp Swelling    THROAT   Family History  Problem Relation Age of Onset   Diabetes Mother    Stroke Father    Hypertension Father    Hearing loss Father    Colon polyps Father    Colon cancer Neg Hx    Esophageal cancer Neg Hx    Prostate cancer Neg Hx    PE: BP 138/80   Pulse 76   Ht 6' 2.5" (1.892 m)   Wt 250 lb 12.8 oz (113.8 kg)   SpO2 99%   BMI 31.77 kg/m  Wt Readings from Last 3 Encounters:  05/10/23 250 lb 12.8 oz (113.8 kg)  01/17/23 246 lb 6.4 oz (111.8 kg)  11/27/22 242 lb (109.8 kg)   Constitutional: overweight, in NAD Eyes:  EOMI, no exophthalmos ENT: no neck masses, no cervical lymphadenopathy Cardiovascular: RRR, No MRG Respiratory: CTA B Musculoskeletal: + deformities (L BKA, R 2nd toe amputation) Skin:no rashes Neurological: no tremor with outstretched hands  ASSESSMENT:  1. DM2, non-insulin-dependent, uncontrolled, with long-term complications - CAD - s/p AMI - s/p CABG x4 06/2018-sees Dr. Allyson Sabal - right foot second ray amputation 12/2019 - left foot third ray amputation 12/2020 - left transmetatarsal amputation 11/2021 - left BKA 05/2022 - revision left BKA 06/2022  2. HL  3.  Obesity class I  PLAN:  1. Patient with longstanding, uncontrolled, type 2 diabetes, who returned at last visit after a longer absence, at 1.5 years.  HbA1c was higher, at 8.2%, increased from 6.2%.  Before the visit, he came off [place] due to nausea and diarrhea.  He did not want to restart this.  He agreed with a referral to nutrition and he saw the nutritionist 3 months ago.  We restarted metformin and I also recommended Rybelsus, after discussion about benefits and possible side effects. - at today's visit, sugars are improved in the morning  but they are still slightly higher than target at that time.  He did start to improve his diet and to exercise more.  However, he was not able to tolerate Rybelsus due to GI side effects.  We discussed about options for treatment.  Since he is trying to lose weight, I would like to avoid medications with weight gain as a side effect.  Therefore, I suggested a DPP 4 inhibitor, but he declines for now, wanting to continue to work on his diet and exercise.  I did advise him to check blood sugars more throughout the day and send them to be in a month.  At that time, we may need to add a meglitinide before problematic meals. - I suggested to:  Patient Instructions  Please continue: - Metformin 1000 mg with b'fast and 500 mg with dinner  Please check blood sugars 1x aday, rotating check times and let me know about them in 1 month.  I referred you to [facility].  Please return in 3-4 months.  - we checked his HbA1c: 7.6% (lower) - advised to check sugars at different times of the day - 1x a day, rotating check times - advised for yearly eye exams >> he is UTD - return to clinic in 3-4 months  2.  HL -Reviewed his latest lipid panel from 04/2021: LDL at goal, triglycerides elevated, HDL low: Lab Results  Component Value Date   CHOL 124 05/01/2021   HDL 35.00 (L) 05/01/2021   LDLCALC 59 04/15/2020   LDLDIRECT 52.0 05/01/2021   TRIG 229.0 (H) 05/01/2021   CHOLHDL 4 05/01/2021  -He continues on Lipitor 80 mg daily without side effects -He is due for another lipid panel -will check today  3.  Obesity class I -He is interested in losing weight -Feels he does a lot of emotional eating -He accepts a referral to the weight management clinic  Carlus Pavlov, MD PhD [facility] Endocrinology

## 2023-05-14 ENCOUNTER — Encounter: Payer: Self-pay | Admitting: Internal Medicine

## 2023-05-14 DIAGNOSIS — H5213 Myopia, bilateral: Secondary | ICD-10-CM | POA: Diagnosis not present

## 2023-05-14 DIAGNOSIS — H35033 Hypertensive retinopathy, bilateral: Secondary | ICD-10-CM | POA: Diagnosis not present

## 2023-05-14 DIAGNOSIS — H524 Presbyopia: Secondary | ICD-10-CM | POA: Diagnosis not present

## 2023-05-14 DIAGNOSIS — H52203 Unspecified astigmatism, bilateral: Secondary | ICD-10-CM | POA: Diagnosis not present

## 2023-05-14 DIAGNOSIS — H2513 Age-related nuclear cataract, bilateral: Secondary | ICD-10-CM | POA: Diagnosis not present

## 2023-05-14 DIAGNOSIS — Z7984 Long term (current) use of oral hypoglycemic drugs: Secondary | ICD-10-CM | POA: Diagnosis not present

## 2023-05-14 DIAGNOSIS — H25011 Cortical age-related cataract, right eye: Secondary | ICD-10-CM | POA: Diagnosis not present

## 2023-05-14 DIAGNOSIS — E119 Type 2 diabetes mellitus without complications: Secondary | ICD-10-CM | POA: Diagnosis not present

## 2023-05-14 LAB — HM DIABETES EYE EXAM

## 2023-05-30 ENCOUNTER — Other Ambulatory Visit: Payer: Self-pay | Admitting: Medical

## 2023-05-30 ENCOUNTER — Other Ambulatory Visit: Payer: Self-pay | Admitting: Orthopedic Surgery

## 2023-05-30 ENCOUNTER — Other Ambulatory Visit: Payer: Self-pay

## 2023-05-30 ENCOUNTER — Other Ambulatory Visit (HOSPITAL_BASED_OUTPATIENT_CLINIC_OR_DEPARTMENT_OTHER): Payer: Self-pay

## 2023-05-30 DIAGNOSIS — N528 Other male erectile dysfunction: Secondary | ICD-10-CM

## 2023-05-31 ENCOUNTER — Other Ambulatory Visit (HOSPITAL_BASED_OUTPATIENT_CLINIC_OR_DEPARTMENT_OTHER): Payer: Self-pay

## 2023-06-01 ENCOUNTER — Other Ambulatory Visit (HOSPITAL_COMMUNITY): Payer: Self-pay

## 2023-06-03 ENCOUNTER — Other Ambulatory Visit (HOSPITAL_BASED_OUTPATIENT_CLINIC_OR_DEPARTMENT_OTHER): Payer: Self-pay

## 2023-06-11 ENCOUNTER — Other Ambulatory Visit (HOSPITAL_BASED_OUTPATIENT_CLINIC_OR_DEPARTMENT_OTHER): Payer: Self-pay

## 2023-06-21 ENCOUNTER — Other Ambulatory Visit: Payer: Self-pay | Admitting: Medical

## 2023-06-21 ENCOUNTER — Other Ambulatory Visit: Payer: Self-pay | Admitting: Orthopedic Surgery

## 2023-06-21 ENCOUNTER — Other Ambulatory Visit (HOSPITAL_BASED_OUTPATIENT_CLINIC_OR_DEPARTMENT_OTHER): Payer: Self-pay

## 2023-06-21 ENCOUNTER — Encounter (HOSPITAL_BASED_OUTPATIENT_CLINIC_OR_DEPARTMENT_OTHER): Payer: Self-pay

## 2023-06-21 ENCOUNTER — Other Ambulatory Visit: Payer: Self-pay

## 2023-06-21 DIAGNOSIS — N528 Other male erectile dysfunction: Secondary | ICD-10-CM

## 2023-06-21 MED ORDER — ALPRAZOLAM 0.5 MG PO TABS
0.5000 mg | ORAL_TABLET | Freq: Two times a day (BID) | ORAL | 1 refills | Status: DC | PRN
  Filled 2023-06-21: qty 20, 10d supply, fill #0
  Filled 2023-07-25: qty 20, 10d supply, fill #1

## 2023-06-24 ENCOUNTER — Other Ambulatory Visit: Payer: Self-pay | Admitting: Orthopedic Surgery

## 2023-06-24 ENCOUNTER — Other Ambulatory Visit: Payer: Self-pay | Admitting: Medical

## 2023-06-24 DIAGNOSIS — N528 Other male erectile dysfunction: Secondary | ICD-10-CM

## 2023-06-24 MED ORDER — GABAPENTIN 300 MG PO CAPS
300.0000 mg | ORAL_CAPSULE | Freq: Every day | ORAL | 3 refills | Status: DC
  Filled 2023-06-24: qty 30, 10d supply, fill #0
  Filled 2023-07-06: qty 30, 10d supply, fill #1
  Filled 2023-07-16: qty 30, 10d supply, fill #2
  Filled 2023-07-28: qty 30, 10d supply, fill #3

## 2023-06-25 ENCOUNTER — Other Ambulatory Visit (HOSPITAL_COMMUNITY): Payer: Self-pay

## 2023-06-25 ENCOUNTER — Other Ambulatory Visit: Payer: Self-pay

## 2023-07-06 ENCOUNTER — Other Ambulatory Visit: Payer: Self-pay | Admitting: Medical

## 2023-07-08 ENCOUNTER — Other Ambulatory Visit (HOSPITAL_BASED_OUTPATIENT_CLINIC_OR_DEPARTMENT_OTHER): Payer: Self-pay

## 2023-07-08 ENCOUNTER — Other Ambulatory Visit: Payer: Self-pay

## 2023-07-08 MED ORDER — HYDROCHLOROTHIAZIDE 12.5 MG PO CAPS
12.5000 mg | ORAL_CAPSULE | Freq: Every day | ORAL | 0 refills | Status: DC
  Filled 2023-07-08: qty 30, 30d supply, fill #0

## 2023-07-25 ENCOUNTER — Other Ambulatory Visit: Payer: Self-pay

## 2023-07-25 ENCOUNTER — Other Ambulatory Visit: Payer: Self-pay | Admitting: Medical

## 2023-07-25 ENCOUNTER — Other Ambulatory Visit (HOSPITAL_COMMUNITY): Payer: Self-pay

## 2023-07-25 DIAGNOSIS — N528 Other male erectile dysfunction: Secondary | ICD-10-CM

## 2023-07-25 MED ORDER — SILDENAFIL CITRATE 20 MG PO TABS
40.0000 mg | ORAL_TABLET | Freq: Every day | ORAL | 1 refills | Status: DC | PRN
Start: 2023-07-25 — End: 2023-08-30
  Filled 2023-07-25 (×2): qty 50, 10d supply, fill #0
  Filled 2023-08-04: qty 50, 10d supply, fill #1

## 2023-07-28 ENCOUNTER — Other Ambulatory Visit: Payer: Self-pay | Admitting: Medical

## 2023-07-29 ENCOUNTER — Other Ambulatory Visit: Payer: Self-pay

## 2023-07-30 ENCOUNTER — Other Ambulatory Visit: Payer: Self-pay

## 2023-07-30 ENCOUNTER — Other Ambulatory Visit: Payer: Self-pay | Admitting: Medical

## 2023-07-30 ENCOUNTER — Other Ambulatory Visit (HOSPITAL_BASED_OUTPATIENT_CLINIC_OR_DEPARTMENT_OTHER): Payer: Self-pay

## 2023-08-01 ENCOUNTER — Other Ambulatory Visit (HOSPITAL_BASED_OUTPATIENT_CLINIC_OR_DEPARTMENT_OTHER): Payer: Self-pay

## 2023-08-04 ENCOUNTER — Other Ambulatory Visit (HOSPITAL_COMMUNITY): Payer: Self-pay

## 2023-08-04 ENCOUNTER — Other Ambulatory Visit: Payer: Self-pay | Admitting: Medical

## 2023-08-05 ENCOUNTER — Other Ambulatory Visit: Payer: Self-pay

## 2023-08-05 ENCOUNTER — Other Ambulatory Visit (HOSPITAL_COMMUNITY): Payer: Self-pay

## 2023-08-05 MED ORDER — LISINOPRIL 5 MG PO TABS
5.0000 mg | ORAL_TABLET | Freq: Every day | ORAL | 3 refills | Status: DC
  Filled 2023-08-05: qty 90, 90d supply, fill #0
  Filled 2023-11-04: qty 30, 30d supply, fill #1
  Filled 2023-11-04: qty 30, 30d supply, fill #0

## 2023-08-05 MED ORDER — HYDROCHLOROTHIAZIDE 12.5 MG PO CAPS
12.5000 mg | ORAL_CAPSULE | Freq: Every day | ORAL | 0 refills | Status: DC
  Filled 2023-08-05: qty 30, 30d supply, fill #0

## 2023-08-30 ENCOUNTER — Other Ambulatory Visit (HOSPITAL_BASED_OUTPATIENT_CLINIC_OR_DEPARTMENT_OTHER): Payer: Self-pay

## 2023-08-30 ENCOUNTER — Other Ambulatory Visit: Payer: Self-pay | Admitting: Family Medicine

## 2023-08-30 ENCOUNTER — Other Ambulatory Visit: Payer: Self-pay | Admitting: Orthopedic Surgery

## 2023-08-30 ENCOUNTER — Other Ambulatory Visit: Payer: Self-pay | Admitting: Medical

## 2023-08-30 ENCOUNTER — Other Ambulatory Visit: Payer: Self-pay

## 2023-08-30 ENCOUNTER — Other Ambulatory Visit (HOSPITAL_COMMUNITY): Payer: Self-pay

## 2023-08-30 DIAGNOSIS — N528 Other male erectile dysfunction: Secondary | ICD-10-CM

## 2023-08-30 MED ORDER — SILDENAFIL CITRATE 20 MG PO TABS
40.0000 mg | ORAL_TABLET | Freq: Every day | ORAL | 1 refills | Status: DC | PRN
  Filled 2023-08-30 – 2023-10-08 (×2): qty 50, 10d supply, fill #0
  Filled 2023-10-31: qty 50, 10d supply, fill #1
  Filled 2023-11-04: qty 50, 10d supply, fill #0

## 2023-08-30 MED ORDER — TADALAFIL 10 MG PO TABS
10.0000 mg | ORAL_TABLET | ORAL | 1 refills | Status: DC | PRN
  Filled 2023-08-30: qty 6, 30d supply, fill #0

## 2023-08-30 MED ORDER — GABAPENTIN 300 MG PO CAPS
300.0000 mg | ORAL_CAPSULE | Freq: Every day | ORAL | 3 refills | Status: DC
  Filled 2023-08-30 – 2023-10-08 (×2): qty 30, 10d supply, fill #0
  Filled 2023-11-04: qty 30, 10d supply, fill #1
  Filled 2023-12-08: qty 30, 10d supply, fill #2
  Filled 2024-01-02 – 2024-01-10 (×2): qty 30, 10d supply, fill #3
  Filled 2024-01-10: qty 30, 10d supply, fill #0

## 2023-08-30 MED ORDER — TESTOSTERONE 30 MG/ACT TD SOLN
2.0000 | Freq: Every day | TRANSDERMAL | 0 refills | Status: DC
  Filled 2023-08-30: qty 90, 30d supply, fill #0

## 2023-09-02 ENCOUNTER — Other Ambulatory Visit: Payer: Self-pay

## 2023-09-02 ENCOUNTER — Other Ambulatory Visit: Payer: Self-pay | Admitting: Family Medicine

## 2023-09-02 ENCOUNTER — Other Ambulatory Visit (HOSPITAL_COMMUNITY): Payer: Self-pay

## 2023-09-03 ENCOUNTER — Other Ambulatory Visit (HOSPITAL_COMMUNITY): Payer: Self-pay

## 2023-09-03 ENCOUNTER — Other Ambulatory Visit (HOSPITAL_BASED_OUTPATIENT_CLINIC_OR_DEPARTMENT_OTHER): Payer: Self-pay

## 2023-09-03 MED ORDER — HYDROCHLOROTHIAZIDE 12.5 MG PO CAPS
12.5000 mg | ORAL_CAPSULE | Freq: Every day | ORAL | 0 refills | Status: DC
  Filled 2023-09-03 (×2): qty 30, 30d supply, fill #0

## 2023-09-05 ENCOUNTER — Other Ambulatory Visit (HOSPITAL_BASED_OUTPATIENT_CLINIC_OR_DEPARTMENT_OTHER): Payer: Self-pay

## 2023-09-05 ENCOUNTER — Ambulatory Visit: Payer: 59 | Admitting: Medical

## 2023-09-05 VITALS — BP 120/66 | HR 78 | Temp 97.6°F | Resp 18 | Ht 74.0 in | Wt 247.0 lb

## 2023-09-05 DIAGNOSIS — J0100 Acute maxillary sinusitis, unspecified: Secondary | ICD-10-CM | POA: Diagnosis not present

## 2023-09-05 DIAGNOSIS — F419 Anxiety disorder, unspecified: Secondary | ICD-10-CM

## 2023-09-05 DIAGNOSIS — H6690 Otitis media, unspecified, unspecified ear: Secondary | ICD-10-CM | POA: Diagnosis not present

## 2023-09-05 DIAGNOSIS — I10 Essential (primary) hypertension: Secondary | ICD-10-CM | POA: Diagnosis not present

## 2023-09-05 MED ORDER — BENZONATATE 100 MG PO CAPS
100.0000 mg | ORAL_CAPSULE | Freq: Three times a day (TID) | ORAL | 0 refills | Status: AC | PRN
  Filled 2023-09-05: qty 30, 10d supply, fill #0

## 2023-09-05 MED ORDER — BUSPIRONE HCL 7.5 MG PO TABS
7.5000 mg | ORAL_TABLET | Freq: Two times a day (BID) | ORAL | 0 refills | Status: DC
  Filled 2023-09-05: qty 60, 30d supply, fill #0

## 2023-09-05 MED ORDER — AMOXICILLIN-POT CLAVULANATE 875-125 MG PO TABS
1.0000 | ORAL_TABLET | Freq: Two times a day (BID) | ORAL | 0 refills | Status: AC
  Filled 2023-09-05: qty 20, 10d supply, fill #0

## 2023-09-05 MED ORDER — PREVNAR 20 0.5 ML IM SUSY
0.5000 mL | PREFILLED_SYRINGE | Freq: Once | INTRAMUSCULAR | 0 refills | Status: AC
  Filled 2023-09-05: qty 0.5, 1d supply, fill #0

## 2023-09-05 MED ORDER — FLULAVAL 0.5 ML IM SUSY
0.5000 mL | PREFILLED_SYRINGE | Freq: Once | INTRAMUSCULAR | 0 refills | Status: AC
  Filled 2023-09-05: qty 0.5, 1d supply, fill #0

## 2023-09-05 MED ORDER — FLUTICASONE PROPIONATE 50 MCG/ACT NA SUSP
2.0000 | Freq: Every day | NASAL | 1 refills | Status: DC
  Filled 2023-09-05: qty 16, 30d supply, fill #0
  Filled 2023-10-08: qty 16, 30d supply, fill #1

## 2023-09-05 NOTE — Patient Instructions (Signed)
 Acute Sinusitis and Right Otitis Media Recent onset of nasal congestion, facial pain, and right ear pain following an upper respiratory infection. -Prescribe Augmentin  875mg  twice daily. -Prescribe Flonase  for nasal decongestion. -Prescribe Benzonatate  1 tablet every 8 hours as needed for cough.  Anxiety Patient has been using Xanax  intermittently for anxiety. Discussed potential risks of long-term benzodiazepine use, including potential association with dementia. -Prescribe Buspar  7.5mg  twice daily as a non-controlled alternative to Xanax .  Hypertension Well controlled on Lisinopril  and Metoprolol . -Continue current regimen.  Hyperlipidemia On Atorvastatin  80mg  daily, managed by cardiologist. -Continue current regimen.  Diabetes Follow-up with endocrinologist scheduled. -Continue current regimen.  General Health Maintenance -flu vaccine due to recent uptick in flu cases. -pcv 20  vaccine, pending further investigation. -out of flu vaccine today. Can get both down stairs  Follow up in 2 weeks or sooner if needed  Luccia Reinheimer, PA-C

## 2023-09-05 NOTE — Progress Notes (Signed)
 Subjective:    Patient ID: Joshua Branch, male    DOB: [birth year], 63 y.o.   MRN: [id number]  HPI  Discussed the use of AI scribe software for clinical note transcription with the patient, who gave verbal consent to proceed.  History of Present Illness   Joshua Branch is a 63 year old male who presents with right ear pain and sinus congestion.  He has been experiencing right ear pain that began last night, described as severe enough to disrupt his sleep. This is accompanied by nasal congestion that started on Monday, preceding the ear pain. He also reports difficulty hearing,  He has associated symptoms including a mild cough, sinus pain particularly in the cheeks, and some dental discomfort. He denies the presence of fever, chills, sweats, and body aches since the onset of symptoms. He has been using Sudafed to manage his symptoms.  His past medical history includes hypertension, hyperlipidemia, and anxiety. He is on medications for blood pressure, including lisinopril , hctz, and metoprolol , and takes atorvastatin  80 mg daily for cholesterol management. He uses Xanax  0.5 mg twice a day as needed for anxiety, though he states he uses it rarely due to concerns about potential addiction.  He mentions a family history of Alzheimer's disease, with both his mother and sister affected. He is aware of a 30% chance of developing Alzheimer's due to his family history. No report of memory concerns.  He has made dietary changes, including reducing soda, sweets, and fried foods, and eating out less. He is unsure if he received a flu vaccine in the past year but recalls receiving it the year before.        Review of Systems  Constitutional:  Negative for chills and fatigue.  HENT:  Positive for congestion, ear pain, sinus pressure and sinus pain.   Respiratory:  Positive for cough. Negative for choking and wheezing.   Cardiovascular:  Negative for chest pain and palpitations.  Gastrointestinal:   Negative for abdominal pain, diarrhea and nausea.  Musculoskeletal:  Negative for back pain and myalgias.  Skin:  Negative for rash.  Neurological:  Negative for dizziness, seizures, weakness and light-headedness.   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/2021   Hyperlipidemia    Hypertension    Neuropathy of left foot    Nodular basal cell carcinoma (BCC) 07/10/2019   Right V of Neck (curet and 5FU)   Osteomyelitis of third toe of left foot ([name]    Pneumonia    As a child   Right foot ulcer ([name]    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   LIMA to LAD, SVG to Diag, SVG to OM2, SVG to [name], EVH via right thigh and leg   Sleep apnea    Subacute osteomyelitis, left ankle and foot ([name] 05/25/2022   Type II diabetes mellitus with complication, uncontrolled    Uncontrolled type 2 diabetes mellitus      Social History   Socioeconomic History   Marital status: Divorced  Spouse name: Mliss   Number of children: 2   Years of education: Not on file   Highest education level: Professional school degree (e.g., MD, DDS, DVM, JD)  Occupational History   Occupation: attorney  Tobacco Use   Smoking status: Never   Smokeless tobacco: Never  Vaping Use   Vaping status: Never Used  Substance and Sexual Activity   Alcohol use: Yes    Comment: maybe 2 beers a month   Drug use: Never   Sexual activity: Not on file  Other Topics Concern   Not on file  Social History Narrative   Not on file   Social Drivers of Health   Financial Resource Strain: Low Risk  (09/05/2023)   Overall  Financial Resource Strain (CARDIA)    Difficulty of Paying Living Expenses: Not hard at all  Food Insecurity: No Food Insecurity (09/05/2023)   Hunger Vital Sign    Worried About Running Out of Food in the Last Year: Never true    Ran Out of Food in the Last Year: Never true  Transportation Needs: No Transportation Needs (09/05/2023)   PRAPARE - Administrator, Civil Service (Medical): No    Lack of Transportation (Non-Medical): No  Physical Activity: Insufficiently Active (09/05/2023)   Exercise Vital Sign    Days of Exercise per Week: 2 days    Minutes of Exercise per Session: 30 min  Stress: No Stress Concern Present (09/05/2023)   [employer] of Occupational Health - Occupational Stress Questionnaire    Feeling of Stress : Only a little  Social Connections: Moderately Isolated (09/05/2023)   Social Connection and Isolation Panel [NHANES]    Frequency of Communication with Friends and Family: More than three times a week    Frequency of Social Gatherings with Friends and Family: Twice a week    Attends Religious Services: More than 4 times per year    Active Member of [employer] or Organizations: No    Attends Banker Meetings: Not on file    Marital Status: Divorced  Intimate Partner Violence: Not At Risk (06/15/2022)   Humiliation, Afraid, Rape, and Kick questionnaire    Fear of Current or Ex-Partner: No    Emotionally Abused: No    Physically Abused: No    Sexually Abused: No    Past Surgical History:  Procedure Laterality Date   AMPUTATION Right 01/06/2020   Procedure: RIGHT FOOT 2ND RAY AMPUTATION;  Surgeon: Harden Jerona GAILS, MD;  Location: [facility] OR;  Service: Orthopedics;  Laterality: Right;   AMPUTATION Left 01/11/2021   Procedure: LEFT FOOT 3RD RAY AMPUTATION;  Surgeon: Harden Jerona GAILS, MD;  Location: [facility] OR;  Service: Orthopedics;  Laterality: Left;   AMPUTATION Left 12/22/2021   Procedure: LEFT TRANSMETATARSAL AMPUTATION;  Surgeon: Harden Jerona GAILS, MD;   Location: [facility] OR;  Service: Orthopedics;  Laterality: Left;   AMPUTATION Left 05/25/2022   Procedure: LEFT FOOT CHOPART;  Surgeon: Harden Jerona GAILS, MD;  Location: [facility] OR;  Service: Orthopedics;  Laterality: Left;   AMPUTATION Left 06/15/2022   Procedure: LEFT BELOW KNEE AMPUTATION;  Surgeon: Harden Jerona GAILS, MD;  Location: [facility] OR;  Service: Orthopedics;  Laterality: Left;   APPLICATION OF WOUND VAC  12/22/2021   Procedure: APPLICATION OF WOUND VAC;  Surgeon: Harden Jerona GAILS, MD;  Location: [facility] OR;  Service: Orthopedics;;   APPLICATION OF WOUND VAC Left 05/25/2022   Procedure: APPLICATION OF WOUND VAC;  Surgeon: Harden Jerona GAILS, MD;  Location: [facility] OR;  Service: Orthopedics;  Laterality: Left;   COLONOSCOPY W/ POLYPECTOMY     CORONARY ARTERY BYPASS GRAFT N/A 06/25/2018   Procedure: CORONARY ARTERY BYPASS GRAFTING (CABG) TIMES FOUR USING LEFT INTERNAL MAMMARY ARTERY AND RIGHT ENDOSCOPICALLY HARVESTED SAPHENOUS VEIN;  Surgeon: Dusty Sudie DEL, MD;  Location: [facility] OR;  Service: Open Heart Surgery;  Laterality: N/A;   CYSTOSCOPY WITH STENT PLACEMENT Left 12/04/2021   Procedure: CYSTOSCOPY/RETROGRADE/ STENT PLACEMENT;  Surgeon: Lovie Arlyss CROME, MD;  Location: [facility] ORS;  Service: Urology;  Laterality: Left;   CYSTOSCOPY/URETEROSCOPY/HOLMIUM LASER/STENT PLACEMENT Left 01/05/2022   Procedure: CYSTOSCOPY LEFT URETERAL STENT REMOVAL  LEFT RETROGRADE PYELOGRAM URETEROSCOPY BASKET STONE EXTRACTION STENT PLACEMENT;  Surgeon: Carolee Sherwood JONETTA DOUGLAS, MD;  Location: [facility] ORS;  Service: Urology;  Laterality: Left;  1 HR FOR CASE   ENDOVEIN HARVEST OF GREATER SAPHENOUS VEIN Right 06/25/2018   Procedure: ENDOVEIN HARVEST OF GREATER SAPHENOUS VEIN;  Surgeon: Dusty Sudie DEL, MD;  Location: [facility] OR;  Service: Open Heart Surgery;  Laterality: Right;   EXTRACORPOREAL SHOCK WAVE LITHOTRIPSY Left 12/07/2021   Procedure: EXTRACORPOREAL SHOCK WAVE LITHOTRIPSY (ESWL);  Surgeon: Nieves Cough, MD;  Location: [facility];  Service:  Urology;  Laterality: Left;   LEFT HEART CATH AND CORONARY ANGIOGRAPHY N/A 06/22/2018   Procedure: LEFT HEART CATH AND CORONARY ANGIOGRAPHY;  Surgeon: Elmira Newman PARAS, MD;  Location: [facility];  Service: Cardiovascular;  Laterality: N/A;   SKIN SPLIT GRAFT Left 02/28/2022   Procedure: LEFT FOOT SKIN GRAFT;  Surgeon: Harden Jerona GAILS, MD;  Location: [facility] OR;  Service: Orthopedics;  Laterality: Left;   STUMP REVISION Left 07/06/2022   Procedure: REVISION LEFT BELOW KNEE AMPUTATION  WITH VAC PLACEMENT;  Surgeon: Harden Jerona GAILS, MD;  Location: [facility] OR;  Service: Orthopedics;  Laterality: Left;   STUMP REVISION Left 08/03/2022   Procedure: REVISION LEFT BELOW KNEE AMPUTATION;  Surgeon: Harden Jerona GAILS, MD;  Location: [facility] OR;  Service: Orthopedics;  Laterality: Left;   surgical debridement  Right    Right foot ulcer   TEE WITHOUT CARDIOVERSION N/A 06/25/2018   Procedure: TRANSESOPHAGEAL ECHOCARDIOGRAM (TEE);  Surgeon: Dusty Sudie DEL, MD;  Location: [facility] OR;  Service: Open Heart Surgery;  Laterality: N/A;    Family History  Problem Relation Age of Onset   Diabetes Mother    Stroke Father    Hypertension Father    Hearing loss Father    Colon polyps Father    Colon cancer Neg Hx    Esophageal cancer Neg Hx    Prostate cancer Neg Hx     Allergies  Allergen Reactions   Apricot Kernel Oil [Prunus] Swelling    THROAT   Cherry Swelling    THROAT   Jardiance  [Empagliflozin ] Diarrhea and Nausea And Vomiting   Other Other (See Comments)    Fruits with a pit. Throat swells up. Can have them if cooked    Peach [Prunus Persica] Swelling    THROAT   Plum Pulp Swelling    THROAT    Current Outpatient Medications on File Prior to Visit  Medication Sig Dispense Refill   ALPRAZolam  (XANAX ) 0.5 MG tablet Take 1 tablet (0.5 mg total) by mouth 2 (two) times daily as needed for anxiety. 20 tablet 1   aspirin  EC 81 MG tablet Take 1 tablet (81 mg total) by mouth daily. Swallow whole. 120 tablet 0    atorvastatin  (LIPITOR ) 80 MG tablet Take 1 tablet (80 mg total) by mouth daily. 90 tablet 2  gabapentin  (NEURONTIN ) 300 MG capsule Take 1 capsule (300 mg total) by mouth at bedtime; up to 3 times a day when necessary for neuropathy pain. 30 capsule 3   glucose blood (FREESTYLE LITE) test strip USE AS INSTRUCTED TO CHECK 1-2X A DAY 100 strip 1   hydrochlorothiazide  (MICROZIDE ) 12.5 MG capsule Take 1 capsule (12.5 mg total) by mouth daily. *patient needs appointment* 30 capsule 0   Iron , Ferrous Sulfate , 325 (65 Fe) MG TABS Take 1 tablet by mouth everyday 100 tablet 3   Lancets MISC Check sugars 3 times a day E11.9 100 each 0   lisinopril  (ZESTRIL ) 5 MG tablet Take 1 tablet (5 mg total) by mouth daily. 30 tablet 3   Melatonin 5 MG CAPS Take 5 mg by mouth at bedtime as needed (sleep).     metFORMIN  (GLUCOPHAGE ) 500 MG tablet Take 3 tablets (1,500 mg total) by mouth daily as advised. 270 tablet 3   metoprolol  tartrate (LOPRESSOR ) 25 MG tablet Take 1 tablet (25 mg total) by mouth 2 (two) times daily. 180 tablet 1   Multiple Vitamin (MULTIVITAMIN WITH MINERALS) TABS tablet Take 1 tablet by mouth every evening.     ondansetron  (ZOFRAN ) 4 MG tablet Take 1 tablet (4 mg total) by mouth every 8 (eight) hours as needed for nausea or vomiting. 20 tablet 0   Semaglutide  (RYBELSUS ) 7 MG TABS Take 1 tablet (7 mg total) by mouth daily. 90 tablet 3   sildenafil  (REVATIO ) 20 MG tablet Take 2-5 tablets (40-100 mg total) by mouth 1 hour before sex as needed. **MAX OF 5 TABLETS WITHIN A 24 HOUR PERIOD.** 50 tablet 1   sildenafil  (VIAGRA ) 50 MG tablet Take 1 tablet (50 mg total) by mouth daily as needed for erectile dysfunction. 10 tablet 0   tadalafil  (CIALIS ) 10 MG tablet Take 1 tablet (10 mg total) by mouth every other day as needed for erectile dysfunction. 10 tablet 1   Testosterone  30 MG/ACT SOLN Apply 2 pumps to skin daily 90 mL 1   Testosterone  30 MG/ACT SOLN Apply 2 pumps topically daily as directed 90 mL 1    Testosterone  30 MG/ACT SOLN Apply 2 Pump topically daily. 90 mL 0   Valerian 450 MG CAPS Take 450 mg by mouth daily.     Zoster Vaccine Adjuvanted (SHINGRIX ) injection Inject into the muscle. 0.5 mL 0   No current facility-administered medications on file prior to visit.    BP 120/66   Pulse 78   Temp 97.6 F (36.4 C)   Resp 18   Ht 6' 2 (1.88 m)   Wt 247 lb (112 kg)   SpO2 94%   BMI 31.71 kg/m        Objective:   Physical Exam  General- No acute distress. Pleasant patient. Neck- Full range of motion, no jvd Lungs- Clear, even and unlabored. Heart- regular rate and rhythm. Neurologic- CNII- XII grossly intact.  Heent- rt canals clear. Rt tm is red. Left canal clear. Normal tm. Maxillary sinus pressure     Assessment & Plan:  Acute Sinusitis and Right Otitis Media Recent onset of nasal congestion, facial pain, and right ear pain following an upper respiratory infection. -Prescribe Augmentin  875mg  twice daily. -Prescribe Flonase  for nasal decongestion. -Prescribe Benzonatate  1 tablet every 8 hours as needed for cough.  Anxiety Patient has been using Xanax  intermittently for anxiety. Discussed potential risks of long-term benzodiazepine use, including potential association with dementia. -Prescribe Buspar  7.5mg  twice daily as a non-controlled alternative  to Xanax .  Hypertension Well controlled on Lisinopril  and Metoprolol . -Continue current regimen.  Hyperlipidemia On Atorvastatin  80mg  daily, managed by cardiologist. -Continue current regimen.  Diabetes Follow-up with endocrinologist scheduled. -Continue current regimen.  General Health Maintenance -Consider receiving flu vaccine due to recent uptick in flu cases. -Consider receiving pneumonia vaccine, pending further investigation.  Follow up in 2 weeks or sooner if needed

## 2023-09-11 ENCOUNTER — Ambulatory Visit (INDEPENDENT_AMBULATORY_CARE_PROVIDER_SITE_OTHER): Payer: 59 | Admitting: Internal Medicine

## 2023-09-11 ENCOUNTER — Encounter: Payer: Self-pay | Admitting: Internal Medicine

## 2023-09-11 ENCOUNTER — Other Ambulatory Visit (HOSPITAL_BASED_OUTPATIENT_CLINIC_OR_DEPARTMENT_OTHER): Payer: Self-pay

## 2023-09-11 VITALS — BP 146/80 | HR 83 | Ht 74.0 in | Wt 248.0 lb

## 2023-09-11 DIAGNOSIS — E1165 Type 2 diabetes mellitus with hyperglycemia: Secondary | ICD-10-CM

## 2023-09-11 DIAGNOSIS — E1159 Type 2 diabetes mellitus with other circulatory complications: Secondary | ICD-10-CM

## 2023-09-11 DIAGNOSIS — E66811 Obesity, class 1: Secondary | ICD-10-CM | POA: Diagnosis not present

## 2023-09-11 DIAGNOSIS — Z7984 Long term (current) use of oral hypoglycemic drugs: Secondary | ICD-10-CM

## 2023-09-11 DIAGNOSIS — E782 Mixed hyperlipidemia: Secondary | ICD-10-CM | POA: Diagnosis not present

## 2023-09-11 LAB — POCT GLYCOSYLATED HEMOGLOBIN (HGB A1C): Hemoglobin A1C: 7.5 % — AB (ref 4.0–5.6)

## 2023-09-11 MED ORDER — METFORMIN HCL 500 MG PO TABS
1500.0000 mg | ORAL_TABLET | Freq: Every day | ORAL | 3 refills | Status: AC
  Filled 2023-09-11 – 2023-11-19 (×2): qty 270, 90d supply, fill #0
  Filled 2024-01-02 – 2024-02-17 (×2): qty 270, 90d supply, fill #1
  Filled 2024-04-27 – 2024-05-16 (×2): qty 270, 90d supply, fill #2
  Filled 2024-06-08 – 2024-09-03 (×8): qty 270, 90d supply, fill #3

## 2023-09-11 NOTE — Progress Notes (Addendum)
Patient ID: Joshua Branch, male   DOB: 26-Apr-1961, 63 y.o.   MRN: [id number]   HPI: Joshua Branch is a 63 y.o.-year-old male, presenting for follow-up for DM2, dx before 2017, non-insulin-dependent, uncontrolled, with long-term complications (CAD - s/p AMI 05/2018- s/p CABG x4 06/2018, nonhealing skin ulcers >> toe amputations and L BKA).  Last visit 4 months ago.  Interim history: No increased urination, blurry vision, nausea, chest pain. He started to adjust diet and started to exercise more before last visit.  However, he stopped exercise since last visit.  He plans to restart. At last visit I referred him to the weight management clinic but he did not have an appointment yet. He sees nutrition. In 01-08/2023, he eliminated sodas, cakes, cookies, candy bars, almost no fried foods. Also reduced portions.  Reviewed his HbA1c levels: Lab Results  Component Value Date   HGBA1C 7.6 (A) 05/10/2023   HGBA1C 8.2 (A) 11/27/2022   HGBA1C 6.2 (H) 01/04/2022   HGBA1C 7.3 (A) 06/14/2021   HGBA1C 7.4 (A) 01/27/2021   HGBA1C 6.9 (H) 04/15/2020   HGBA1C 7.8 (H) 05/08/2019   HGBA1C 8.2 (H) 06/23/2018   HGBA1C 11.7 (H) 01/17/2018   HGBA1C 10.8 (H) 12/07/2016   He is on: - Metformin 1000 mg 2x a day, with meals - started 12/2017 >> 500 mg 2x a day b/c felt poorly on the higher dose ("felt like my sugars were crashing") >>  500 mg with b'fast and 1000 mg with dinner >> 1000 mg in am and 500 mg with dinner -  >> stopped 2/2 N and D Previously on Jardiance but this caused abdominal pain, nausea, diarrhea. Also developed nausea and diarrhea from [place].  He would not want to restart this. Previously on Invokana. He was previously on glipizide but ran out.  Pt checks his sugars once a day:  - am: 105-120, 165 >> 110-115  >> 125-175 >> 110-125 >> 110-125 - 2h after b'fast: 130-160 >> 140-145 >> 130-175 >> n/c - before lunch: n/c - 2h after lunch: n/c - before dinner: n/c - 2h after dinner:  170-190 >> 175-185 >> 200s >> 180-205 >> 110-130 - bedtime: n/c - nighttime: n/c Lowest sugar was 110 >> 80 >> 110 >> high 90s x1-2; it is unclear at which level he has hypoglycemia awareness Highest sugar was 185 >> 200 >> 205 >> 130  Glucometer: True Metrix  Pt's meals are: - Breakfast: protein shake, smoothie, yoghurt, occas. Eggs and bacon - Lunch: sandwich, chips - Dinner: chicken, veggie - Snacks: 3 a day He started to change his diet in 02/2018: He eliminated fast foods, eats less sweets, less alcohol.  -No CKD, last BUN/creatinine:  Lab Results  Component Value Date   BUN 20 05/10/2023   BUN 20 07/06/2022   CREATININE 0.98 05/10/2023   CREATININE 1.08 07/06/2022   Lab Results  Component Value Date   MICRALBCREAT 1.0 05/10/2023  On lisinopril 10.  -+ HL; last set of lipids: Lab Results  Component Value Date   CHOL 114 05/10/2023   HDL 33.90 (L) 05/10/2023   LDLCALC 52 05/10/2023   LDLDIRECT 52.0 05/01/2021   TRIG 144.0 05/10/2023   CHOLHDL 3 05/10/2023  On Lipitor 80.  - last eye exam was on 05/13/2023: no DR. Dr. Severiano Gilbert at [facility].  -+ Numbness and tingling in his feet.  Foot exams at [facility]. Before our visit from 10/2022, he had a left transmetatarsal amputation and then left BKA followed  by a revision. We previously started 1000 mcg B12 daily.  Currently followed by PCP.  On ASA 81.  Pt has FH of DM in mother, sister.  ROS: + See HPI  I reviewed pt's medications, allergies, PMH, social hx, family hx, and changes were documented in the history of present illness. Otherwise, unchanged from my initial visit note.  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 ([name] 01/18/2021   Hyperlipidemia    Hypertension    Neuropathy of left foot    Nodular basal cell carcinoma (BCC) 07/10/2019   Right V of Neck (curet and 5FU)   Osteomyelitis of third toe of left foot ([name]    Pneumonia    As a child   Right foot ulcer ([name]    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   LIMA to LAD, SVG to Diag, SVG to OM2, SVG to [name], EVH via right thigh and leg   Sleep apnea    Subacute osteomyelitis, left ankle and foot ([name] 05/25/2022   Type II diabetes mellitus with complication, uncontrolled    Uncontrolled type 2 diabetes mellitus    Past Surgical History:  Procedure Laterality Date   AMPUTATION Right 01/06/2020   Procedure: RIGHT FOOT 2ND RAY AMPUTATION;  Surgeon: Nadara Mustard, MD;  Location: [facility] OR;  Service: Orthopedics;  Laterality: Right;   AMPUTATION Left 01/11/2021   Procedure: LEFT FOOT 3RD RAY AMPUTATION;  Surgeon: Nadara Mustard, MD;  Location: [facility] OR;  Service: Orthopedics;  Laterality: Left;   AMPUTATION Left 12/22/2021   Procedure: LEFT TRANSMETATARSAL AMPUTATION;  Surgeon: Nadara Mustard, MD;  Location: [facility] OR;  Service: Orthopedics;  Laterality: Left;   AMPUTATION Left 05/25/2022   Procedure: LEFT FOOT CHOPART;  Surgeon: Nadara Mustard, MD;  Location: [facility] OR;  Service: Orthopedics;  Laterality: Left;   AMPUTATION Left 06/15/2022   Procedure: LEFT BELOW KNEE AMPUTATION;  Surgeon: Nadara Mustard, MD;  Location: [facility] OR;  Service: Orthopedics;  Laterality: Left;   APPLICATION OF WOUND VAC  12/22/2021   Procedure: APPLICATION OF WOUND VAC;  Surgeon: Nadara Mustard, MD;  Location: [facility] OR;  Service: Orthopedics;;   APPLICATION OF WOUND VAC Left 05/25/2022   Procedure: APPLICATION OF WOUND VAC;  Surgeon: Nadara Mustard, MD;  Location: [facility] OR;  Service: Orthopedics;  Laterality: Left;   COLONOSCOPY W/ POLYPECTOMY     CORONARY ARTERY BYPASS  GRAFT N/A 06/25/2018   Procedure: CORONARY ARTERY BYPASS GRAFTING (CABG) TIMES FOUR USING LEFT INTERNAL MAMMARY ARTERY AND RIGHT ENDOSCOPICALLY HARVESTED SAPHENOUS VEIN;  Surgeon: Purcell Nails, MD;  Location: [facility] OR;  Service: Open Heart Surgery;  Laterality: N/A;   CYSTOSCOPY WITH STENT PLACEMENT Left 12/04/2021   Procedure: CYSTOSCOPY/RETROGRADE/ STENT PLACEMENT;  Surgeon: Despina Arias, MD;  Location: [facility] ORS;  Service: Urology;  Laterality: Left;   CYSTOSCOPY/URETEROSCOPY/HOLMIUM LASER/STENT PLACEMENT Left 01/05/2022   Procedure: CYSTOSCOPY LEFT URETERAL STENT REMOVAL  LEFT RETROGRADE PYELOGRAM URETEROSCOPY BASKET STONE EXTRACTION STENT PLACEMENT;  Surgeon: Crista Elliot, MD;  Location: [facility]  ORS;  Service: Urology;  Laterality: Left;  1 HR FOR CASE   ENDOVEIN HARVEST OF GREATER SAPHENOUS VEIN Right 06/25/2018   Procedure: ENDOVEIN HARVEST OF GREATER SAPHENOUS VEIN;  Surgeon: Purcell Nails, MD;  Location: [facility] OR;  Service: Open Heart Surgery;  Laterality: Right;   EXTRACORPOREAL SHOCK WAVE LITHOTRIPSY Left 12/07/2021   Procedure: EXTRACORPOREAL SHOCK WAVE LITHOTRIPSY (ESWL);  Surgeon: Jerilee Field, MD;  Location: [facility];  Service: Urology;  Laterality: Left;   LEFT HEART CATH AND CORONARY ANGIOGRAPHY N/A 06/22/2018   Procedure: LEFT HEART CATH AND CORONARY ANGIOGRAPHY;  Surgeon: Elder Negus, MD;  Location: [facility] INVASIVE CV LAB;  Service: Cardiovascular;  Laterality: N/A;   SKIN SPLIT GRAFT Left 02/28/2022   Procedure: LEFT FOOT SKIN GRAFT;  Surgeon: Nadara Mustard, MD;  Location: [facility] OR;  Service: Orthopedics;  Laterality: Left;   STUMP REVISION Left 07/06/2022   Procedure: REVISION LEFT BELOW KNEE AMPUTATION  WITH VAC PLACEMENT;  Surgeon: Nadara Mustard, MD;  Location: [facility] OR;  Service: Orthopedics;  Laterality: Left;   STUMP REVISION Left 08/03/2022   Procedure: REVISION LEFT BELOW KNEE AMPUTATION;  Surgeon: Nadara Mustard, MD;  Location: [facility] OR;  Service:  Orthopedics;  Laterality: Left;   surgical debridement  Right    Right foot ulcer   TEE WITHOUT CARDIOVERSION N/A 06/25/2018   Procedure: TRANSESOPHAGEAL ECHOCARDIOGRAM (TEE);  Surgeon: Purcell Nails, MD;  Location: [facility] OR;  Service: Open Heart Surgery;  Laterality: N/A;   Social History   Socioeconomic History   Marital status: Divorced    Spouse name: Raynelle Fanning   Number of children: 2   Years of education: Not on file   Highest education level: Professional school degree (e.g., MD, DDS, DVM, JD)  Occupational History   Occupation: attorney  Tobacco Use   Smoking status: Never   Smokeless tobacco: Never  Vaping Use   Vaping status: Never Used  Substance and Sexual Activity   Alcohol use: Yes    Comment: maybe 2 beers a month   Drug use: Never   Sexual activity: Not on file  Other Topics Concern   Not on file  Social History Narrative   Not on file   Social Drivers of Health   Financial Resource Strain: Low Risk  (09/05/2023)   Overall Financial Resource Strain (CARDIA)    Difficulty of Paying Living Expenses: Not hard at all  Food Insecurity: No Food Insecurity (09/05/2023)   Hunger Vital Sign    Worried About Running Out of Food in the Last Year: Never true    Ran Out of Food in the Last Year: Never true  Transportation Needs: No Transportation Needs (09/05/2023)   PRAPARE - Administrator, Civil Service (Medical): No    Lack of Transportation (Non-Medical): No  Physical Activity: Insufficiently Active (09/05/2023)   Exercise Vital Sign    Days of Exercise per Week: 2 days    Minutes of Exercise per Session: 30 min  Stress: No Stress Concern Present (09/05/2023)   [employer] of Occupational Health - Occupational Stress Questionnaire    Feeling of Stress : Only a little  Social Connections: Moderately Isolated (09/05/2023)   Social Connection and Isolation Panel [NHANES]    Frequency of Communication with Friends and Family: More than three times a week     Frequency of Social Gatherings with Friends and Family: Twice a week    Attends Religious Services: More than 4 times per year  Active Member of Clubs or Organizations: No    Attends Banker Meetings: Not on file    Marital Status: Divorced  Intimate Partner Violence: Not At Risk (06/15/2022)   Humiliation, Afraid, Rape, and Kick questionnaire    Fear of Current or Ex-Partner: No    Emotionally Abused: No    Physically Abused: No    Sexually Abused: No   Current Outpatient Medications on File Prior to Visit  Medication Sig Dispense Refill   ALPRAZolam (XANAX) 0.5 MG tablet Take 1 tablet (0.5 mg total) by mouth 2 (two) times daily as needed for anxiety. 20 tablet 1   amoxicillin-clavulanate (AUGMENTIN) 875-125 MG tablet Take 1 tablet by mouth 2 (two) times daily. 20 tablet 0   aspirin EC 81 MG tablet Take 1 tablet (81 mg total) by mouth daily. Swallow whole. 120 tablet 0   atorvastatin (LIPITOR) 80 MG tablet Take 1 tablet (80 mg total) by mouth daily. 90 tablet 2   benzonatate (TESSALON) 100 MG capsule Take 1 capsule (100 mg total) by mouth 3 (three) times daily as needed for cough. 30 capsule 0   busPIRone (BUSPAR) 7.5 MG tablet Take 1 tablet (7.5 mg total) by mouth 2 (two) times daily. 60 tablet 0   fluticasone (FLONASE) 50 MCG/ACT nasal spray Place 2 sprays into both nostrils daily. 16 g 1   gabapentin (NEURONTIN) 300 MG capsule Take 1 capsule (300 mg total) by mouth at bedtime; up to 3 times a day when necessary for neuropathy pain. 30 capsule 3   glucose blood (FREESTYLE LITE) test strip USE AS INSTRUCTED TO CHECK 1-2X A DAY 100 strip 1   hydrochlorothiazide (MICROZIDE) 12.5 MG capsule Take 1 capsule (12.5 mg total) by mouth daily. *patient needs appointment* 30 capsule 0   Iron, Ferrous Sulfate, 325 (65 Fe) MG TABS Take 1 tablet by mouth everyday 100 tablet 3   Lancets MISC Check sugars 3 times a day E11.9 100 each 0   lisinopril (ZESTRIL) 5 MG tablet Take 1 tablet (5 mg  total) by mouth daily. 30 tablet 3   Melatonin 5 MG CAPS Take 5 mg by mouth at bedtime as needed (sleep).     metFORMIN (GLUCOPHAGE) 500 MG tablet Take 3 tablets (1,500 mg total) by mouth daily as advised. 270 tablet 3   metoprolol tartrate (LOPRESSOR) 25 MG tablet Take 1 tablet (25 mg total) by mouth 2 (two) times daily. 180 tablet 1   Multiple Vitamin (MULTIVITAMIN WITH MINERALS) TABS tablet Take 1 tablet by mouth every evening.     ondansetron (ZOFRAN) 4 MG tablet Take 1 tablet (4 mg total) by mouth every 8 (eight) hours as needed for nausea or vomiting. 20 tablet 0   Semaglutide (RYBELSUS) 7 MG TABS Take 1 tablet (7 mg total) by mouth daily. 90 tablet 3   sildenafil (REVATIO) 20 MG tablet Take 2-5 tablets (40-100 mg total) by mouth 1 hour before sex as needed. **MAX OF 5 TABLETS WITHIN A 24 HOUR PERIOD.** 50 tablet 1   sildenafil (VIAGRA) 50 MG tablet Take 1 tablet (50 mg total) by mouth daily as needed for erectile dysfunction. 10 tablet 0   tadalafil (CIALIS) 10 MG tablet Take 1 tablet (10 mg total) by mouth every other day as needed for erectile dysfunction. 10 tablet 1   Testosterone 30 MG/ACT SOLN Apply 2 pumps to skin daily 90 mL 1   Testosterone 30 MG/ACT SOLN Apply 2 pumps topically daily as directed 90 mL 1  Testosterone 30 MG/ACT SOLN Apply 2 Pump topically daily. 90 mL 0   Valerian 450 MG CAPS Take 450 mg by mouth daily.     Zoster Vaccine Adjuvanted [facility]) injection Inject into the muscle. 0.5 mL 0   No current facility-administered medications on file prior to visit.   Allergies  Allergen Reactions   Apricot Kernel Oil [Prunus] Swelling    THROAT   Cherry Swelling    THROAT   Jardiance [Empagliflozin] Diarrhea and Nausea And Vomiting   Other Other (See Comments)    Fruits with a pit. Throat swells up. Can have them if cooked    Peach [Prunus Persica] Swelling    THROAT   Plum Pulp Swelling    THROAT   Family History  Problem Relation Age of Onset   Diabetes  Mother    Stroke Father    Hypertension Father    Hearing loss Father    Colon polyps Father    Colon cancer Neg Hx    Esophageal cancer Neg Hx    Prostate cancer Neg Hx    PE: BP (!) 150/96   Pulse 83   Ht 6\' 2"  (1.88 m)   Wt 248 lb (112.5 kg)   SpO2 98%   BMI 31.84 kg/m On repeat, at the end of the visit, the blood pressure was 146/80  Wt Readings from Last 3 Encounters:  09/11/23 248 lb (112.5 kg)  09/05/23 247 lb (112 kg)  05/10/23 250 lb 12.8 oz (113.8 kg)   Constitutional: overweight, in NAD Eyes:  EOMI, no exophthalmos ENT: no neck masses, no cervical lymphadenopathy Cardiovascular: RRR, No MRG Respiratory: CTA B Musculoskeletal: + deformities (L BKA, R 2nd toe amputation) Skin:no rashes Neurological: no tremor with outstretched hands  ASSESSMENT: 1. DM2, non-insulin-dependent, uncontrolled, with long-term complications - CAD - s/p AMI - s/p CABG x4 06/2018-sees Dr. Allyson Sabal - right foot second ray amputation 12/2019 - left foot third ray amputation 12/2020 - left transmetatarsal amputation 11/2021 - left BKA 05/2022 - revision left BKA 06/2022  2. HL  3.  Obesity class I  PLAN:  1. Patient with longstanding, uncontrolled, type 2 diabetes, with many complications, who returned in 10/2022 after 1.5 years of absence.  At that time, HbA1c was 8.2%, increased from 6.2%.  He had, Farxiga due to nausea and diarrhea and he did not want to restart this.  We restarted metformin and I also recommended Rybelsus but he was not able to tolerate this due to GI side effects.  He saw a nutritionist and started to improve his diet and to exercise more.  Sugars improved and HbA1c decreased to 7.6%.  At last visit sugars appears to be improved in the morning but they were still higher than target at that time.  I advised him to check some sugars later in the day and we also discussed about possibly starting a DPP 4 inhibitor but he declined, as he wanted to continue to work on diet and  exercise.  I advised him to send me the sugars in approximately 1 month, but he did not send these.  We are trying to avoid medications with weight gain as a side effect for him. -At today's visit, sugars have improved after dinner and they are now at goal.  Upon questioning, he started to make changes in her diet by eliminated sodas and sweets and also reducing significantly fried foods.  He only lost 2 pounds since last visit blood sugars are significantly improved.  Since  he only started to make this changes in the last 1 to 2 months, HbA1c is not reflecting the extent of the changes (see below) but I expect the next A1c to be better. -I refilled his metformin - I suggested to:  Patient Instructions  Please continue: - Metformin 1000 mg with b'fast and 500 mg with dinner  Keep up the good work!  Please return in 4 months.  - we checked his HbA1c: 7.5% (slightly lower) - advised to check sugars at different times of the day - 1x a day, rotating check times - advised for yearly eye exams >> he is UTD - he is seen frequently at the [facility] - appt coming up.  I advised him to send me the records. - return to clinic in 4 months  2.  HL -Latest lipid panel was reviewed from 04/2023: Fractions at goal with the exception of a slightly low HDL: Lab Results  Component Value Date   CHOL 114 05/10/2023   HDL 33.90 (L) 05/10/2023   LDLCALC 52 05/10/2023   LDLDIRECT 52.0 05/01/2021   TRIG 144.0 05/10/2023   CHOLHDL 3 05/10/2023  -He continues on Lipitor 80 mg daily without side effects  3.  Obesity class I -He described emotional eating.  He was interested in weight loss. -At last visit he excepted a referral to the weight management clinic, but he saw the nutritionist and started to make changes in his diet after the holidays. -He lost 2 pounds since last visit.  Carlus Pavlov, MD PhD [facility] Endocrinology

## 2023-09-11 NOTE — Addendum Note (Signed)
Addended by: Pollie Meyer on: 09/11/2023 09:45 AM   Modules accepted: Orders

## 2023-09-11 NOTE — Patient Instructions (Addendum)
Please continue: - Metformin 1000 mg with b'fast and 500 mg with dinner  Keep up the good work!  Please return in 4 months.

## 2023-09-13 DIAGNOSIS — F4323 Adjustment disorder with mixed anxiety and depressed mood: Secondary | ICD-10-CM | POA: Diagnosis not present

## 2023-09-16 ENCOUNTER — Encounter: Payer: Self-pay | Admitting: Medical

## 2023-09-17 ENCOUNTER — Other Ambulatory Visit (HOSPITAL_BASED_OUTPATIENT_CLINIC_OR_DEPARTMENT_OTHER): Payer: Self-pay

## 2023-09-17 MED ORDER — ESCITALOPRAM OXALATE 10 MG PO TABS
10.0000 mg | ORAL_TABLET | Freq: Every day | ORAL | 0 refills | Status: DC
  Filled 2023-09-17: qty 30, 30d supply, fill #0

## 2023-09-17 NOTE — Addendum Note (Signed)
Addended by: Gwenevere Abbot on: 09/17/2023 08:58 AM   Modules accepted: Orders

## 2023-09-24 DIAGNOSIS — Z125 Encounter for screening for malignant neoplasm of prostate: Secondary | ICD-10-CM | POA: Diagnosis not present

## 2023-09-24 DIAGNOSIS — E291 Testicular hypofunction: Secondary | ICD-10-CM | POA: Diagnosis not present

## 2023-10-07 ENCOUNTER — Other Ambulatory Visit (HOSPITAL_BASED_OUTPATIENT_CLINIC_OR_DEPARTMENT_OTHER): Payer: Self-pay

## 2023-10-07 MED ORDER — AMOXICILLIN 500 MG PO CAPS
ORAL_CAPSULE | ORAL | 0 refills | Status: AC
Start: 2023-10-07 — End: 2023-10-18
  Filled 2023-10-07: qty 32, 11d supply, fill #0

## 2023-10-08 ENCOUNTER — Other Ambulatory Visit (HOSPITAL_BASED_OUTPATIENT_CLINIC_OR_DEPARTMENT_OTHER): Payer: Self-pay

## 2023-10-08 ENCOUNTER — Other Ambulatory Visit: Payer: Self-pay

## 2023-10-08 ENCOUNTER — Other Ambulatory Visit (HOSPITAL_COMMUNITY): Payer: Self-pay

## 2023-10-08 ENCOUNTER — Other Ambulatory Visit: Payer: Self-pay | Admitting: Family Medicine

## 2023-10-08 DIAGNOSIS — E291 Testicular hypofunction: Secondary | ICD-10-CM | POA: Diagnosis not present

## 2023-10-08 MED ORDER — TESTOSTERONE 30 MG/ACT TD SOLN
2.0000 | Freq: Every day | TRANSDERMAL | 1 refills | Status: DC
Start: 2023-10-08 — End: 2024-02-17
  Filled 2023-10-08 – 2023-11-04 (×2): qty 90, 30d supply, fill #0
  Filled 2024-01-13: qty 90, 30d supply, fill #1

## 2023-10-08 MED ORDER — BUSPIRONE HCL 7.5 MG PO TABS: 7.5000 mg | ORAL_TABLET | Freq: Two times a day (BID) | ORAL | 0 refills | Status: DC

## 2023-10-08 MED ORDER — HYDROCHLOROTHIAZIDE 12.5 MG PO CAPS
12.5000 mg | ORAL_CAPSULE | Freq: Every day | ORAL | 1 refills | Status: DC
  Filled 2023-10-08: qty 90, 90d supply, fill #0
  Filled 2024-01-02 – 2024-01-28 (×2): qty 90, 90d supply, fill #1

## 2023-10-08 NOTE — Addendum Note (Signed)
 Addended by: Gwenevere Abbot on: 10/08/2023 09:48 PM   Modules accepted: Orders

## 2023-10-08 NOTE — Addendum Note (Signed)
 Addended by: Gwenevere Abbot on: 10/08/2023 12:34 PM   Modules accepted: Orders

## 2023-10-16 DIAGNOSIS — F4323 Adjustment disorder with mixed anxiety and depressed mood: Secondary | ICD-10-CM | POA: Diagnosis not present

## 2023-10-18 ENCOUNTER — Other Ambulatory Visit (HOSPITAL_BASED_OUTPATIENT_CLINIC_OR_DEPARTMENT_OTHER): Payer: Self-pay

## 2023-10-31 ENCOUNTER — Other Ambulatory Visit: Payer: Self-pay

## 2023-10-31 ENCOUNTER — Other Ambulatory Visit: Payer: Self-pay | Admitting: Medical

## 2023-10-31 ENCOUNTER — Other Ambulatory Visit (HOSPITAL_BASED_OUTPATIENT_CLINIC_OR_DEPARTMENT_OTHER): Payer: Self-pay

## 2023-11-04 ENCOUNTER — Other Ambulatory Visit: Payer: Self-pay | Admitting: Orthopedic Surgery

## 2023-11-04 ENCOUNTER — Other Ambulatory Visit (HOSPITAL_COMMUNITY): Payer: Self-pay

## 2023-11-04 ENCOUNTER — Other Ambulatory Visit (HOSPITAL_BASED_OUTPATIENT_CLINIC_OR_DEPARTMENT_OTHER): Payer: Self-pay

## 2023-11-04 MED ORDER — ALPRAZOLAM 0.5 MG PO TABS
0.5000 mg | ORAL_TABLET | Freq: Two times a day (BID) | ORAL | 1 refills | Status: DC | PRN
  Filled 2023-11-04: qty 20, 10d supply, fill #0
  Filled 2024-02-17: qty 20, 10d supply, fill #1

## 2023-11-05 ENCOUNTER — Other Ambulatory Visit (HOSPITAL_BASED_OUTPATIENT_CLINIC_OR_DEPARTMENT_OTHER): Payer: Self-pay

## 2023-11-05 ENCOUNTER — Other Ambulatory Visit: Payer: Self-pay

## 2023-11-05 MED ORDER — BUSPIRONE HCL 7.5 MG PO TABS: 7.5000 mg | ORAL_TABLET | Freq: Two times a day (BID) | ORAL | 0 refills | Status: DC

## 2023-11-05 MED ORDER — BUSPIRONE HCL 7.5 MG PO TABS
7.5000 mg | ORAL_TABLET | Freq: Two times a day (BID) | ORAL | 0 refills | Status: DC
  Filled 2023-11-05: qty 60, 30d supply, fill #0

## 2023-11-05 NOTE — Addendum Note (Signed)
 Addended by: Maximino Sarin on: 11/05/2023 08:53 AM   Modules accepted: Orders

## 2023-11-07 ENCOUNTER — Encounter: Payer: Self-pay | Admitting: Orthopedic Surgery

## 2023-11-07 ENCOUNTER — Ambulatory Visit: Admitting: Orthopedic Surgery

## 2023-11-07 DIAGNOSIS — Z89512 Acquired absence of left leg below knee: Secondary | ICD-10-CM | POA: Diagnosis not present

## 2023-11-07 DIAGNOSIS — L97921 Non-pressure chronic ulcer of unspecified part of left lower leg limited to breakdown of skin: Secondary | ICD-10-CM | POA: Diagnosis not present

## 2023-11-07 NOTE — Progress Notes (Signed)
 Office Visit Note   [name]           Date of Birth: 15-Mar-1961           MRN: [id number] Visit Date: 11/07/2023              Requested by: Esperanza Richters, PA-C [id number] [place],  [place] [address] PCP: Esperanza Richters, PA-C  Chief Complaint  Patient presents with   Left Leg - Follow-up    Hx left BKA      HPI: Patient is a 63 year old gentleman who is status post a left below-knee amputation January 2024.  Patient has had progressive loss of residual volume he has had additional hard liners placed in the socket extra ply sock and currently has a end bearing ulcer from subsiding into the socket over the fibula.  Assessment & Plan: Visit Diagnoses:  1. S/P BKA (below knee amputation), left ([name]   2. Skin ulcer of left lower leg, limited to breakdown of skin ([name]     Plan: Patient was provided a prescription for a new socket liner materials and supplies.  Patient is a K3 level ambulator and wishes to resume playing pickle ball and golf.  Patient may need a new foot and ankle to allow for the higher level activity.  Recommended using the prosthetic under liner for rash prevention.  Follow-Up Instructions: No follow-ups on file.   Ortho Exam  Patient is alert, oriented, no adenopathy, well-dressed, normal affect, normal respiratory effort. Examination patient has significant loss of residual volume.  He is subsiding into the socket the socket is pressing on the patella and he has an ulcer over the distal fibula that is 1 cm diameter limited to breakdown of the skin secondary to subsiding in the socket.  Patient has also had a rash from his liner.  Patient is an existing left transtibial  amputee.  Patient's current comorbidities are not expected to impact the ability to function with the prescribed prosthesis. Patient verbally communicates a strong desire to use a prosthesis. Patient currently requires mobility aids to ambulate without a  prosthesis.  Expects not to use mobility aids with a new prosthesis. Patient is expected to resume or reach their K Level within 6 months. Patient was active before the amputation and independent with stairs, uneven terrain, varying cadence, and a community ambulator.  Patient is a K3 level ambulator that spends a lot of time walking around on uneven terrain over obstacles, up and down stairs, and ambulates with a variable cadence.     Imaging: No results found. No images are attached to the encounter.  Labs: Lab Results  Component Value Date   HGBA1C 7.5 (A) 09/11/2023   HGBA1C 7.6 (A) 05/10/2023   HGBA1C 8.2 (A) 11/27/2022   ESRSEDRATE 36 (H) 02/12/2022   REPTSTATUS 08/08/2022 FINAL 08/03/2022   GRAMSTAIN  08/03/2022    RARE WBC PRESENT, PREDOMINANTLY PMN RARE GRAM POSITIVE COCCI INTRACELLULAR    CULT  08/03/2022    FEW METHICILLIN RESISTANT STAPHYLOCOCCUS AUREUS FEW CORYNEBACTERIUM STRIATUM Standardized susceptibility testing for this organism is not available. NO ANAEROBES ISOLATED Performed at [facility], [address] [place], [place] [address]    LABORGA METHICILLIN RESISTANT STAPHYLOCOCCUS AUREUS 08/03/2022     Lab Results  Component Value Date   ALBUMIN 4.1 05/10/2023   ALBUMIN 3.3 (L) 07/06/2022   ALBUMIN 3.8 02/12/2022   PREALBUMIN 17 (L) 07/06/2022    Lab Results  Component Value Date   MG 2.1 05/08/2019   MG 2.2 06/26/2018   MG 3.0 (H) 06/26/2018   Lab Results  Component Value Date   VD25OH 30.56 07/06/2022    Lab Results  Component Value Date   PREALBUMIN 17 (L) 07/06/2022      Latest Ref Rng & Units 07/06/2022   12:28 PM 05/25/2022    6:38 AM 02/12/2022    3:04 PM  CBC EXTENDED  WBC 4.0 - 10.5 K/uL 4.4  6.3  9.0   RBC 4.22 - 5.81 MIL/uL 4.18  3.92  3.97   Hemoglobin 13.0 - 17.0 g/dL 29.5  62.1  30.8   HCT 39.0 - 52.0 % 36.7  33.4  36.4   Platelets 150 - 400 K/uL 345  371  325.0   NEUT# 1.7 - 7.7 K/uL 1.9   5.9   Lymph# 0.7  - 4.0 K/uL 1.1   2.1      There is no height or weight on file to calculate BMI.  Orders:  No orders of the defined types were placed in this encounter.  No orders of the defined types were placed in this encounter.    Procedures: No procedures performed  Clinical Data: No additional findings.  ROS:  All other systems negative, except as noted in the HPI. Review of Systems  Objective: Vital Signs: There were no vitals taken for this visit.  Specialty Comments:  No specialty comments available.  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   Vertigo 08/20/2016   Myringotomy tube status 08/20/2016   Ear fullness, left 08/20/2016   Asymmetrical left sensorineural hearing loss 02/06/2016   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   Cramping of hands 06/02/2014   Eustachian tube dysfunction 06/02/2014   HTN (hypertension) 05/07/2014   Obstructive sleep apnea 05/07/2014   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 ([name] 01/18/2021   Hyperlipidemia     Hypertension    Neuropathy of left foot    Nodular basal cell carcinoma (BCC) 07/10/2019   Right V of Neck (curet and 5FU)   Osteomyelitis of third toe of left foot ([name]    Pneumonia    As a child   Right foot ulcer ([name]    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   LIMA to LAD, SVG to Diag, SVG to OM2, SVG to [name], EVH via right thigh and leg   Sleep apnea    Subacute osteomyelitis, left ankle and foot ([name] 05/25/2022   Type II diabetes  mellitus with complication, uncontrolled    Uncontrolled type 2 diabetes mellitus     Family History  Problem Relation Age of Onset   Diabetes Mother    Stroke Father    Hypertension Father    Hearing loss Father    Colon polyps Father    Colon cancer Neg Hx    Esophageal cancer Neg Hx    Prostate cancer Neg Hx     Past Surgical History:  Procedure Laterality Date   AMPUTATION Right 01/06/2020   Procedure: RIGHT FOOT 2ND RAY AMPUTATION;  Surgeon: Nadara Mustard, MD;  Location: [facility] OR;  Service: Orthopedics;  Laterality: Right;   AMPUTATION Left 01/11/2021   Procedure: LEFT FOOT 3RD RAY AMPUTATION;  Surgeon: Nadara Mustard, MD;  Location: [facility] OR;  Service: Orthopedics;  Laterality: Left;   AMPUTATION Left 12/22/2021   Procedure: LEFT TRANSMETATARSAL AMPUTATION;  Surgeon: Nadara Mustard, MD;  Location: [facility] OR;  Service: Orthopedics;  Laterality: Left;   AMPUTATION Left 05/25/2022   Procedure: LEFT FOOT CHOPART;  Surgeon: Nadara Mustard, MD;  Location: [facility] OR;  Service: Orthopedics;  Laterality: Left;   AMPUTATION Left 06/15/2022   Procedure: LEFT BELOW KNEE AMPUTATION;  Surgeon: Nadara Mustard, MD;  Location: [facility] OR;  Service: Orthopedics;  Laterality: Left;   APPLICATION OF WOUND VAC  12/22/2021   Procedure: APPLICATION OF WOUND VAC;  Surgeon: Nadara Mustard, MD;  Location: [facility] OR;  Service: Orthopedics;;   APPLICATION OF WOUND VAC Left 05/25/2022   Procedure: APPLICATION OF WOUND VAC;  Surgeon: Nadara Mustard, MD;  Location: [facility] OR;  Service: Orthopedics;   Laterality: Left;   COLONOSCOPY W/ POLYPECTOMY     CORONARY ARTERY BYPASS GRAFT N/A 06/25/2018   Procedure: CORONARY ARTERY BYPASS GRAFTING (CABG) TIMES FOUR USING LEFT INTERNAL MAMMARY ARTERY AND RIGHT ENDOSCOPICALLY HARVESTED SAPHENOUS VEIN;  Surgeon: Purcell Nails, MD;  Location: [facility] OR;  Service: Open Heart Surgery;  Laterality: N/A;   CYSTOSCOPY WITH STENT PLACEMENT Left 12/04/2021   Procedure: CYSTOSCOPY/RETROGRADE/ STENT PLACEMENT;  Surgeon: Despina Arias, MD;  Location: [facility] ORS;  Service: Urology;  Laterality: Left;   CYSTOSCOPY/URETEROSCOPY/HOLMIUM LASER/STENT PLACEMENT Left 01/05/2022   Procedure: CYSTOSCOPY LEFT URETERAL STENT REMOVAL  LEFT RETROGRADE PYELOGRAM URETEROSCOPY BASKET STONE EXTRACTION STENT PLACEMENT;  Surgeon: Crista Elliot, MD;  Location: [facility];  Service: Urology;  Laterality: Left;  1 HR FOR CASE   ENDOVEIN HARVEST OF GREATER SAPHENOUS VEIN Right 06/25/2018   Procedure: ENDOVEIN HARVEST OF GREATER SAPHENOUS VEIN;  Surgeon: Purcell Nails, MD;  Location: [facility] OR;  Service: Open Heart Surgery;  Laterality: Right;   EXTRACORPOREAL SHOCK WAVE LITHOTRIPSY Left 12/07/2021   Procedure: EXTRACORPOREAL SHOCK WAVE LITHOTRIPSY (ESWL);  Surgeon: Jerilee Field, MD;  Location: [facility];  Service: Urology;  Laterality: Left;   LEFT HEART CATH AND CORONARY ANGIOGRAPHY N/A 06/22/2018   Procedure: LEFT HEART CATH AND CORONARY ANGIOGRAPHY;  Surgeon: Elder Negus, MD;  Location: [facility];  Service: Cardiovascular;  Laterality: N/A;   SKIN SPLIT GRAFT Left 02/28/2022   Procedure: LEFT FOOT SKIN GRAFT;  Surgeon: Nadara Mustard, MD;  Location: [facility] OR;  Service: Orthopedics;  Laterality: Left;   STUMP REVISION Left 07/06/2022   Procedure: REVISION LEFT BELOW KNEE AMPUTATION  WITH VAC PLACEMENT;  Surgeon: Nadara Mustard, MD;  Location: [facility] OR;  Service: Orthopedics;  Laterality: Left;   STUMP REVISION Left 08/03/2022   Procedure: REVISION LEFT BELOW KNEE  AMPUTATION;  Surgeon: Nadara Mustard, MD;  Location: [facility] OR;  Service: Orthopedics;  Laterality: Left;   surgical debridement  Right    Right foot ulcer   TEE WITHOUT CARDIOVERSION N/A 06/25/2018   Procedure: TRANSESOPHAGEAL ECHOCARDIOGRAM (TEE);  Surgeon: Purcell Nails, MD;  Location: [facility] OR;  Service: Open Heart Surgery;  Laterality: N/A;   Social History   Occupational History   Occupation: attorney  Tobacco Use   Smoking status: Never   Smokeless tobacco: Never  Vaping Use   Vaping status: Never Used  Substance and Sexual Activity   Alcohol use: Yes    Comment: maybe 2 beers a month   Drug use: Never   Sexual activity: Not on file

## 2023-11-20 ENCOUNTER — Other Ambulatory Visit (HOSPITAL_BASED_OUTPATIENT_CLINIC_OR_DEPARTMENT_OTHER): Payer: Self-pay

## 2023-12-08 ENCOUNTER — Other Ambulatory Visit: Payer: Self-pay | Admitting: Medical

## 2023-12-08 ENCOUNTER — Other Ambulatory Visit: Payer: Self-pay | Admitting: Family Medicine

## 2023-12-08 ENCOUNTER — Other Ambulatory Visit: Payer: Self-pay | Admitting: Cardiovascular Disease

## 2023-12-08 DIAGNOSIS — N528 Other male erectile dysfunction: Secondary | ICD-10-CM

## 2023-12-09 ENCOUNTER — Other Ambulatory Visit (HOSPITAL_COMMUNITY): Payer: Self-pay

## 2023-12-09 ENCOUNTER — Other Ambulatory Visit: Payer: Self-pay

## 2023-12-09 MED ORDER — ATORVASTATIN CALCIUM 80 MG PO TABS
80.0000 mg | ORAL_TABLET | Freq: Every day | ORAL | 2 refills | Status: DC
  Filled 2023-12-09: qty 90, 90d supply, fill #0
  Filled 2024-02-17 – 2024-03-04 (×2): qty 90, 90d supply, fill #1
  Filled 2024-06-11: qty 90, 90d supply, fill #2

## 2023-12-09 MED ORDER — SILDENAFIL CITRATE 20 MG PO TABS
40.0000 mg | ORAL_TABLET | Freq: Every day | ORAL | 1 refills | Status: DC | PRN
  Filled 2023-12-09: qty 50, 10d supply, fill #0
  Filled 2024-01-02 – 2024-01-13 (×2): qty 50, 10d supply, fill #1

## 2023-12-09 MED ORDER — BUSPIRONE HCL 7.5 MG PO TABS
7.5000 mg | ORAL_TABLET | Freq: Two times a day (BID) | ORAL | 0 refills | Status: DC
  Filled 2023-12-09: qty 60, 30d supply, fill #0

## 2023-12-09 MED ORDER — LISINOPRIL 5 MG PO TABS
5.0000 mg | ORAL_TABLET | Freq: Every day | ORAL | 3 refills | Status: DC
  Filled 2023-12-09: qty 30, 30d supply, fill #0
  Filled 2024-01-28: qty 30, 30d supply, fill #1
  Filled 2024-02-18 – 2024-03-17 (×2): qty 30, 30d supply, fill #2
  Filled 2024-03-17: qty 60, 60d supply, fill #2
  Filled 2024-03-17: qty 30, 30d supply, fill #2
  Filled 2024-04-27: qty 30, 30d supply, fill #3

## 2023-12-10 DIAGNOSIS — F4323 Adjustment disorder with mixed anxiety and depressed mood: Secondary | ICD-10-CM | POA: Diagnosis not present

## 2023-12-19 ENCOUNTER — Encounter: Payer: Self-pay | Admitting: Cardiovascular Disease

## 2023-12-27 ENCOUNTER — Other Ambulatory Visit (HOSPITAL_BASED_OUTPATIENT_CLINIC_OR_DEPARTMENT_OTHER): Payer: Self-pay

## 2023-12-27 MED ORDER — CHLORHEXIDINE GLUCONATE 0.12 % MT SOLN
OROMUCOSAL | 0 refills | Status: AC
  Filled 2023-12-27: qty 473, 16d supply, fill #0

## 2023-12-30 DIAGNOSIS — Z89512 Acquired absence of left leg below knee: Secondary | ICD-10-CM | POA: Diagnosis not present

## 2024-01-02 ENCOUNTER — Other Ambulatory Visit: Payer: Self-pay

## 2024-01-02 ENCOUNTER — Other Ambulatory Visit: Payer: Self-pay | Admitting: Cardiovascular Disease

## 2024-01-02 ENCOUNTER — Other Ambulatory Visit: Payer: Self-pay | Admitting: Medical

## 2024-01-02 ENCOUNTER — Other Ambulatory Visit (HOSPITAL_COMMUNITY): Payer: Self-pay

## 2024-01-02 MED ORDER — BUSPIRONE HCL 7.5 MG PO TABS
7.5000 mg | ORAL_TABLET | Freq: Two times a day (BID) | ORAL | 0 refills | Status: DC
  Filled 2024-01-02 – 2024-02-17 (×2): qty 60, 30d supply, fill #0

## 2024-01-02 MED ORDER — METOPROLOL TARTRATE 25 MG PO TABS
25.0000 mg | ORAL_TABLET | Freq: Two times a day (BID) | ORAL | 0 refills | Status: DC
Start: 2024-01-02 — End: 2024-04-27
  Filled 2024-01-02 – 2024-01-28 (×2): qty 180, 90d supply, fill #0

## 2024-01-03 ENCOUNTER — Other Ambulatory Visit (HOSPITAL_COMMUNITY): Payer: Self-pay

## 2024-01-03 ENCOUNTER — Other Ambulatory Visit: Payer: Self-pay

## 2024-01-03 ENCOUNTER — Encounter: Payer: Self-pay | Admitting: Pharmacist

## 2024-01-07 ENCOUNTER — Encounter: Payer: Self-pay | Admitting: Medical

## 2024-01-08 ENCOUNTER — Other Ambulatory Visit: Payer: Self-pay

## 2024-01-08 ENCOUNTER — Ambulatory Visit (INDEPENDENT_AMBULATORY_CARE_PROVIDER_SITE_OTHER): Admitting: Medical

## 2024-01-08 ENCOUNTER — Other Ambulatory Visit (HOSPITAL_BASED_OUTPATIENT_CLINIC_OR_DEPARTMENT_OTHER): Payer: Self-pay

## 2024-01-08 VITALS — BP 124/76 | HR 86 | Temp 98.0°F | Resp 18 | Ht 74.0 in | Wt 250.6 lb

## 2024-01-08 DIAGNOSIS — J301 Allergic rhinitis due to pollen: Secondary | ICD-10-CM

## 2024-01-08 DIAGNOSIS — H6992 Unspecified Eustachian tube disorder, left ear: Secondary | ICD-10-CM | POA: Diagnosis not present

## 2024-01-08 DIAGNOSIS — R42 Dizziness and giddiness: Secondary | ICD-10-CM | POA: Diagnosis not present

## 2024-01-08 MED ORDER — FLUTICASONE PROPIONATE 50 MCG/ACT NA SUSP
2.0000 | Freq: Every day | NASAL | 1 refills | Status: DC
  Filled 2024-01-08: qty 16, 30d supply, fill #0
  Filled 2024-04-03: qty 16, 30d supply, fill #1

## 2024-01-08 NOTE — Patient Instructions (Addendum)
 Intermittent Vertigo Intermittent vertigo for two weeks, associated with nausea. Episodes less severe than past occurrences. Possible link to stopping Zyrtec  and eustachian tube dysfunction. Epley maneuvers previously ineffective. Advised monitoring blood sugar during episodes. CT of the head without contrast considered if symptoms persist or worsen. Emergency care advised for motor or sensory deficits. - Continue meclizine  as needed. - Restart Flonase , two sprays each nostril daily. - Monitor blood sugar during prolonged episodes. - Consider CT of the head without contrast if symptoms persist or worsen. - Seek emergency care if motor or sensory deficits occur or severe ha with vertigo. -cbc and cmp today  Allergic Rhinitis with Eustachian tube dysfunction Seasonal allergic rhinitis managed with Zyrtec . Ear plugging noted after stopping Zyrtec , possibly contributing to vertigo. Eustachian tube dysfunction discussed. - Restart Flonase , two sprays each nostril daily.  Family History of Dementia Concern about family history of dementia. Discussed hereditary aspects and preventive measures. No current cognitive decline symptoms. - Maintain healthy lifestyle with regular exercise and social interaction. - Monitor for cognitive decline signs.  Follow up date to be determined after my chart update on tuesday

## 2024-01-08 NOTE — Progress Notes (Signed)
 Subjective:    Patient ID: Joshua Branch, male    DOB: 15-May-1961, 63 y.o.   MRN: [id number]  HPI  Joshua Branch is a 63 year old male who presents with intermittent vertigo and ear congestion.  He has been experiencing intermittent vertigo for the past two weeks. The episodes are brief, lasting about ten seconds, and occur every couple of days. However, one episode yesterday lasted approximately five minutes. Typically, his vertigo episodes last around 45 minutes. The vertigo is associated with a sensation of spinning and sometimes nausea. No vomiting, vision changes, headaches, weakness, or slurred speech.  He has a history of similar vertigo episodes occurring intermittently over the past eight years. He usually manages these episodes with meclizine , which he finds helpful. He is unsure of the exact dosage but believes it might be 12.5 or 25 mg. He is concerned about the sedative effects of meclizine  and prefers not to take it unless necessary.  He notes a pattern of ear congestion following the cessation of Zyrtec , which he takes annually from March 15 to May 15 for allergy symptoms such as itchy, watery eyes and a runny nose. Currently, he experiences a 'seashell sound' in his left ear and some congestion in the right ear. He wonders if the cessation of Zyrtec  is related to his current symptoms.  He has previously attempted Epley maneuvers for his vertigo, performed by an occupational therapist, but found them ineffective. He also mentions a concern about experiencing vertigo while driving, although he has not had to pull over during an episode.       Review of Systems  Constitutional:  Negative for chills, fatigue and fever.  Respiratory:  Negative for cough, chest tightness and wheezing.   Cardiovascular:  Negative for chest pain and palpitations.  Gastrointestinal:  Negative for abdominal pain, blood in stool, nausea and vomiting.  Genitourinary:  Negative for dysuria.   Musculoskeletal:  Negative for back pain and myalgias.  Skin:  Negative for rash.  Neurological:  Negative for dizziness, speech difficulty, weakness and light-headedness.       Vertigo. See hpi.  Hematological:  Negative for adenopathy. Does not bruise/bleed easily.  Psychiatric/Behavioral:  Negative for behavioral problems, confusion and sleep disturbance. The patient is not nervous/anxious.    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/2021   Hyperlipidemia    Hypertension    Neuropathy of left foot    Nodular basal cell carcinoma (BCC) 07/10/2019   Right V of Neck (curet and 5FU)   Osteomyelitis of third toe of left foot ([name]    Pneumonia    As a child   Right foot ulcer ([name]    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   LIMA to LAD, SVG to Diag, SVG to OM2, SVG to [name], EVH via right thigh and leg   Sleep apnea    Subacute osteomyelitis, left ankle and foot ([name] 05/25/2022   Type II diabetes mellitus with complication, uncontrolled    Uncontrolled type 2 diabetes mellitus  Social History   Socioeconomic History   Marital status: Divorced    Spouse name: Concha Deed   Number of children: 2   Years of education: Not on file   Highest education level: Professional school degree (e.g., MD, DDS, DVM, JD)  Occupational History   Occupation: attorney  Tobacco Use   Smoking status: Never   Smokeless tobacco: Never  Vaping Use   Vaping status: Never Used  Substance and Sexual Activity   Alcohol use: Yes    Comment: maybe 2 beers a month   Drug use: Never   Sexual activity:  Not on file  Other Topics Concern   Not on file  Social History Narrative   Not on file   Social Drivers of Health   Financial Resource Strain: Low Risk  (09/05/2023)   Overall Financial Resource Strain (CARDIA)    Difficulty of Paying Living Expenses: Not hard at all  Food Insecurity: No Food Insecurity (09/05/2023)   Hunger Vital Sign    Worried About Running Out of Food in the Last Year: Never true    Ran Out of Food in the Last Year: Never true  Transportation Needs: No Transportation Needs (09/05/2023)   PRAPARE - Administrator, Civil Service (Medical): No    Lack of Transportation (Non-Medical): No  Physical Activity: Insufficiently Active (09/05/2023)   Exercise Vital Sign    Days of Exercise per Week: 2 days    Minutes of Exercise per Session: 30 min  Stress: No Stress Concern Present (09/05/2023)   [employer] of Occupational Health - Occupational Stress Questionnaire    Feeling of Stress : Only a little  Social Connections: Moderately Isolated (09/05/2023)   Social Connection and Isolation Panel [NHANES]    Frequency of Communication with Friends and Family: More than three times a week    Frequency of Social Gatherings with Friends and Family: Twice a week    Attends Religious Services: More than 4 times per year    Active Member of [employer] or Organizations: No    Attends Banker Meetings: Not on file    Marital Status: Divorced  Intimate Partner Violence: Not At Risk (06/15/2022)   Humiliation, Afraid, Rape, and Kick questionnaire    Fear of Current or Ex-Partner: No    Emotionally Abused: No    Physically Abused: No    Sexually Abused: No    Past Surgical History:  Procedure Laterality Date   AMPUTATION Right 01/06/2020   Procedure: RIGHT FOOT 2ND RAY AMPUTATION;  Surgeon: Timothy Ford, MD;  Location: [facility] OR;  Service: Orthopedics;  Laterality: Right;   AMPUTATION Left 01/11/2021   Procedure: LEFT FOOT 3RD RAY AMPUTATION;  Surgeon: Timothy Ford, MD;  Location: [facility] OR;  Service: Orthopedics;  Laterality: Left;   AMPUTATION Left 12/22/2021   Procedure: LEFT TRANSMETATARSAL AMPUTATION;  Surgeon: Timothy Ford, MD;  Location: [facility] OR;  Service: Orthopedics;  Laterality: Left;   AMPUTATION Left 05/25/2022   Procedure: LEFT FOOT CHOPART;  Surgeon: Timothy Ford, MD;  Location: [facility] OR;  Service: Orthopedics;  Laterality: Left;   AMPUTATION Left 06/15/2022   Procedure: LEFT BELOW KNEE AMPUTATION;  Surgeon: Timothy Ford, MD;  Location: [facility] OR;  Service: Orthopedics;  Laterality: Left;   APPLICATION OF WOUND VAC  12/22/2021   Procedure: APPLICATION OF WOUND VAC;  Surgeon: Timothy Ford, MD;  Location: [facility] OR;  Service: Orthopedics;;   APPLICATION OF WOUND VAC Left 05/25/2022   Procedure:  APPLICATION OF WOUND VAC;  Surgeon: Timothy Ford, MD;  Location: [facility] OR;  Service: Orthopedics;  Laterality: Left;   COLONOSCOPY W/ POLYPECTOMY     CORONARY ARTERY BYPASS GRAFT N/A 06/25/2018   Procedure: CORONARY ARTERY BYPASS GRAFTING (CABG) TIMES FOUR USING LEFT INTERNAL MAMMARY ARTERY AND RIGHT ENDOSCOPICALLY HARVESTED SAPHENOUS VEIN;  Surgeon: Gardenia Jump, MD;  Location: [facility] OR;  Service: Open Heart Surgery;  Laterality: N/A;   CYSTOSCOPY WITH STENT PLACEMENT Left 12/04/2021   Procedure: CYSTOSCOPY/RETROGRADE/ STENT PLACEMENT;  Surgeon: Mallie Seal, MD;  Location: [facility] ORS;  Service: Urology;  Laterality: Left;   CYSTOSCOPY/URETEROSCOPY/HOLMIUM LASER/STENT PLACEMENT Left 01/05/2022   Procedure: CYSTOSCOPY LEFT URETERAL STENT REMOVAL  LEFT RETROGRADE PYELOGRAM URETEROSCOPY BASKET STONE EXTRACTION STENT PLACEMENT;  Surgeon: Samson Croak, MD;  Location: [facility] ORS;  Service: Urology;  Laterality: Left;  1 HR FOR CASE   ENDOVEIN HARVEST OF GREATER SAPHENOUS VEIN Right 06/25/2018   Procedure: ENDOVEIN HARVEST OF GREATER SAPHENOUS VEIN;  Surgeon: Gardenia Jump, MD;  Location: [facility] OR;  Service: Open Heart Surgery;  Laterality: Right;   EXTRACORPOREAL  SHOCK WAVE LITHOTRIPSY Left 12/07/2021   Procedure: EXTRACORPOREAL SHOCK WAVE LITHOTRIPSY (ESWL);  Surgeon: Christina Coyer, MD;  Location: [facility];  Service: Urology;  Laterality: Left;   LEFT HEART CATH AND CORONARY ANGIOGRAPHY N/A 06/22/2018   Procedure: LEFT HEART CATH AND CORONARY ANGIOGRAPHY;  Surgeon: Cody Das, MD;  Location: [facility] INVASIVE [facility] LAB;  Service: Cardiovascular;  Laterality: N/A;   SKIN SPLIT GRAFT Left 02/28/2022   Procedure: LEFT FOOT SKIN GRAFT;  Surgeon: Timothy Ford, MD;  Location: [facility] OR;  Service: Orthopedics;  Laterality: Left;   STUMP REVISION Left 07/06/2022   Procedure: REVISION LEFT BELOW KNEE AMPUTATION  WITH VAC PLACEMENT;  Surgeon: Timothy Ford, MD;  Location: [facility] OR;  Service: Orthopedics;  Laterality: Left;   STUMP REVISION Left 08/03/2022   Procedure: REVISION LEFT BELOW KNEE AMPUTATION;  Surgeon: Timothy Ford, MD;  Location: [facility] OR;  Service: Orthopedics;  Laterality: Left;   surgical debridement  Right    Right foot ulcer   TEE WITHOUT CARDIOVERSION N/A 06/25/2018   Procedure: TRANSESOPHAGEAL ECHOCARDIOGRAM (TEE);  Surgeon: Gardenia Jump, MD;  Location: [facility] OR;  Service: Open Heart Surgery;  Laterality: N/A;    Family History  Problem Relation Age of Onset   Diabetes Mother    Stroke Father    Hypertension Father    Hearing loss Father    Colon polyps Father    Colon cancer Neg Hx    Esophageal cancer Neg Hx    Prostate cancer Neg Hx     Allergies  Allergen Reactions   Apricot Kernel Oil [Prunus] Swelling    THROAT   Cherry Swelling    THROAT   Jardiance  [Empagliflozin ] Diarrhea and Nausea And Vomiting   Other Other (See Comments)    Fruits with a pit. Throat swells up. Can have them if cooked    Peach [Prunus Persica] Swelling    THROAT   Plum Pulp Swelling    THROAT    Current Outpatient Medications on File Prior to Visit  Medication Sig Dispense Refill   ALPRAZolam  (XANAX ) 0.5 MG tablet Take 1 tablet  (0.5 mg total) by mouth 2 (two) times daily as needed for anxiety. 20 tablet 1   amoxicillin -clavulanate (AUGMENTIN ) 875-125 MG tablet Take 1 tablet by mouth 2 (two) times daily. 20 tablet 0   aspirin  EC 81 MG tablet  Take 1 tablet (81 mg total) by mouth daily. Swallow whole. 120 tablet 0   atorvastatin  (LIPITOR ) 80 MG tablet Take 1 tablet (80 mg total) by mouth daily. 90 tablet 2   benzonatate  (TESSALON ) 100 MG capsule Take 1 capsule (100 mg total) by mouth 3 (three) times daily as needed for cough. 30 capsule 0   busPIRone  (BUSPAR ) 7.5 MG tablet Take 1 tablet (7.5 mg total) by mouth 2 (two) times daily. 60 tablet 0   chlorhexidine  (PERIDEX ) 0.12 % solution Rinse and spit 1 capful by mouth every 12 hours.  Do not swallow. 473 mL 0   fluticasone  (FLONASE ) 50 MCG/ACT nasal spray Place 2 sprays into both nostrils daily. 16 g 1   gabapentin  (NEURONTIN ) 300 MG capsule Take 1 capsule (300 mg total) by mouth at bedtime; up to 3 times a day when necessary for neuropathy pain. 30 capsule 3   glucose blood (FREESTYLE LITE) test strip USE AS INSTRUCTED TO CHECK 1-2X A DAY 100 strip 1   hydrochlorothiazide  (MICROZIDE ) 12.5 MG capsule Take 1 capsule (12.5 mg total) by mouth daily. *patient needs appointment* 90 capsule 1   Iron , Ferrous Sulfate , 325 (65 Fe) MG TABS Take 1 tablet by mouth everyday 100 tablet 3   Lancets MISC Check sugars 3 times a day E11.9 100 each 0   lisinopril  (ZESTRIL ) 5 MG tablet Take 1 tablet (5 mg total) by mouth daily. 30 tablet 3   Melatonin 5 MG CAPS Take 5 mg by mouth at bedtime as needed (sleep).     metFORMIN  (GLUCOPHAGE ) 500 MG tablet Take 3 tablets (1,500 mg total) by mouth daily as advised. 270 tablet 3   metoprolol  tartrate (LOPRESSOR ) 25 MG tablet Take 1 tablet (25 mg total) by mouth 2 (two) times daily. Please call to schedule an yearly appointment [phone], Thank you 180 tablet 0   Multiple Vitamin (MULTIVITAMIN WITH MINERALS) TABS tablet Take 1 tablet by mouth every  evening.     ondansetron  (ZOFRAN ) 4 MG tablet Take 1 tablet (4 mg total) by mouth every 8 (eight) hours as needed for nausea or vomiting. 20 tablet 0   sildenafil  (REVATIO ) 20 MG tablet Take 2-5 tablets (40-100 mg total) by mouth 1 hour before sex as needed. **MAX OF 5 TABLETS WITHIN A 24 HOUR PERIOD.** 50 tablet 1   sildenafil  (VIAGRA ) 50 MG tablet Take 1 tablet (50 mg total) by mouth daily as needed for erectile dysfunction. 10 tablet 0   tadalafil  (CIALIS ) 10 MG tablet Take 1 tablet (10 mg total) by mouth every other day as needed for erectile dysfunction. 10 tablet 1   Testosterone  30 MG/ACT SOLN Apply 2 pumps to skin daily 90 mL 1   Testosterone  30 MG/ACT SOLN Apply 2 pumps topically daily as directed 90 mL 1   Testosterone  30 MG/ACT SOLN Apply 2 Pump topically daily. 90 mL 1   Valerian 450 MG CAPS Take 450 mg by mouth daily.     Zoster Vaccine Adjuvanted (SHINGRIX ) injection Inject into the muscle. 0.5 mL 0   No current facility-administered medications on file prior to visit.    BP 124/76   Pulse 86   Temp 98 F (36.7 C)   Resp 18   Ht 6' 2 (1.88 m)   Wt 250 lb 9.6 oz (113.7 kg)   SpO2 96%   BMI 32.18 kg/m       Objective:   Physical Exam  General Mental Status- Alert. General Appearance- Not in acute distress.  Skin General: Color- Normal Color. Moisture- Normal Moisture.  Neck Carotid Arteries- Normal color. Moisture- Normal Moisture. No carotid bruits. No JVD.  Chest and Lung Exam Auscultation: Breath Sounds:-Normal.  Cardiovascular Auscultation:Rythm- Regular. Murmurs & Other Heart Sounds:Auscultation of the heart reveals- No Murmurs.  Abdomen Inspection:-Inspeection Normal. Palpation/Percussion:Note:No mass. Palpation and Percussion of the abdomen reveal- Non Tender, Non Distended + BS, no rebound or guarding.  Neurologic Cranial Nerve exam:- CN III-XII intact(No nystagmus), symmetric smile. Drift Test:- No drift. Romberg Exam:- Negative.  Finger  to Nose:- Normal/Intact Strength:- 5/5 equal and symmetric strength both upper and lower extremities.  0n lying supine and turning head to left and rt no vertigo. No vertigo on supine to sitting.  Heent- canals clear and tm not red. But left tm has appearance of faint chalky white tm.     Assessment & Plan:   Patient Instructions  Intermittent Vertigo Intermittent vertigo for two weeks, associated with nausea. Episodes less severe than past occurrences. Possible link to stopping Zyrtec  and eustachian tube dysfunction. Epley maneuvers previously ineffective. Advised monitoring blood sugar during episodes. CT of the head without contrast considered if symptoms persist or worsen. Emergency care advised for motor or sensory deficits. - Continue meclizine  as needed. - Restart Flonase , two sprays each nostril daily. - Monitor blood sugar during prolonged episodes. - Consider CT of the head without contrast if symptoms persist or worsen. - Seek emergency care if motor or sensory deficits occur or severe ha with vertigo.  Allergic Rhinitis Seasonal allergic rhinitis managed with Zyrtec . Ear plugging noted after stopping Zyrtec , possibly contributing to vertigo. Eustachian tube dysfunction discussed. - Restart Flonase , two sprays each nostril daily.  Family History of Dementia Concern about family history of dementia. Discussed hereditary aspects and preventive measures. No current cognitive decline symptoms. - Maintain healthy lifestyle with regular exercise and social interaction. - Monitor for cognitive decline signs.  Follow up date to be determined after my chart update on tuesday   Stokes Rattigan, PA-C    Time spent with patient today was 41  minutes which consisted of chart rediew, discussing diagnosis, work up, treatment and documentation.

## 2024-01-09 ENCOUNTER — Ambulatory Visit: Payer: Self-pay | Admitting: Medical

## 2024-01-09 LAB — CBC WITH DIFFERENTIAL/PLATELET
Absolute Lymphocytes: 2264 {cells}/uL (ref 850–3900)
Absolute Monocytes: 878 {cells}/uL (ref 200–950)
Basophils Absolute: 69 {cells}/uL (ref 0–200)
Basophils Relative: 0.9 %
Eosinophils Absolute: 146 {cells}/uL (ref 15–500)
Eosinophils Relative: 1.9 %
HCT: 47.6 % (ref 38.5–50.0)
Hemoglobin: 16 g/dL (ref 13.2–17.1)
MCH: 30.7 pg (ref 27.0–33.0)
MCHC: 33.6 g/dL (ref 32.0–36.0)
MCV: 91.4 fL (ref 80.0–100.0)
MPV: 9.8 fL (ref 7.5–12.5)
Monocytes Relative: 11.4 %
Neutro Abs: 4343 {cells}/uL (ref 1500–7800)
Neutrophils Relative %: 56.4 %
Platelets: 248 10*3/uL (ref 140–400)
RBC: 5.21 10*6/uL (ref 4.20–5.80)
RDW: 12.7 % (ref 11.0–15.0)
Total Lymphocyte: 29.4 %
WBC: 7.7 10*3/uL (ref 3.8–10.8)

## 2024-01-09 LAB — COMPREHENSIVE METABOLIC PANEL WITH GFR
AG Ratio: 1.7 (calc) (ref 1.0–2.5)
ALT: 23 U/L (ref 9–46)
AST: 14 U/L (ref 10–35)
Albumin: 4.3 g/dL (ref 3.6–5.1)
Alkaline phosphatase (APISO): 98 U/L (ref 35–144)
BUN: 19 mg/dL (ref 7–25)
CO2: 26 mmol/L (ref 20–32)
Calcium: 9.5 mg/dL (ref 8.6–10.3)
Chloride: 101 mmol/L (ref 98–110)
Creat: 1.05 mg/dL (ref 0.70–1.35)
Globulin: 2.6 g/dL (ref 1.9–3.7)
Glucose, Bld: 142 mg/dL — ABNORMAL HIGH (ref 65–99)
Potassium: 4.1 mmol/L (ref 3.5–5.3)
Sodium: 140 mmol/L (ref 135–146)
Total Bilirubin: 0.5 mg/dL (ref 0.2–1.2)
Total Protein: 6.9 g/dL (ref 6.1–8.1)
eGFR: 81 mL/min/{1.73_m2} (ref >=60)

## 2024-01-10 ENCOUNTER — Other Ambulatory Visit (HOSPITAL_BASED_OUTPATIENT_CLINIC_OR_DEPARTMENT_OTHER): Payer: Self-pay

## 2024-01-10 ENCOUNTER — Other Ambulatory Visit (HOSPITAL_COMMUNITY): Payer: Self-pay

## 2024-01-14 ENCOUNTER — Other Ambulatory Visit: Payer: Self-pay

## 2024-01-14 ENCOUNTER — Other Ambulatory Visit (HOSPITAL_BASED_OUTPATIENT_CLINIC_OR_DEPARTMENT_OTHER): Payer: Self-pay

## 2024-01-22 ENCOUNTER — Ambulatory Visit: Payer: 59 | Admitting: Internal Medicine

## 2024-01-28 ENCOUNTER — Other Ambulatory Visit: Payer: Self-pay | Admitting: Orthopedic Surgery

## 2024-01-29 ENCOUNTER — Other Ambulatory Visit: Payer: Self-pay

## 2024-01-29 ENCOUNTER — Other Ambulatory Visit (HOSPITAL_BASED_OUTPATIENT_CLINIC_OR_DEPARTMENT_OTHER): Payer: Self-pay

## 2024-01-29 MED ORDER — GABAPENTIN 300 MG PO CAPS
300.0000 mg | ORAL_CAPSULE | Freq: Every day | ORAL | 3 refills | Status: DC
  Filled 2024-01-29: qty 30, 30d supply, fill #0
  Filled 2024-02-17 – 2024-02-26 (×2): qty 30, 30d supply, fill #1
  Filled 2024-04-03: qty 30, 30d supply, fill #2
  Filled 2024-05-16: qty 30, 30d supply, fill #3

## 2024-02-17 ENCOUNTER — Other Ambulatory Visit (HOSPITAL_BASED_OUTPATIENT_CLINIC_OR_DEPARTMENT_OTHER): Payer: Self-pay

## 2024-02-17 ENCOUNTER — Other Ambulatory Visit: Payer: Self-pay | Admitting: Medical

## 2024-02-17 ENCOUNTER — Other Ambulatory Visit: Payer: Self-pay

## 2024-02-17 DIAGNOSIS — N528 Other male erectile dysfunction: Secondary | ICD-10-CM

## 2024-02-17 MED ORDER — TESTOSTERONE 30 MG/ACT TD SOLN
2.0000 mL | Freq: Every day | TRANSDERMAL | 1 refills | Status: AC
  Filled 2024-02-17 – 2024-04-06 (×2): qty 90, 30d supply, fill #0
  Filled 2024-05-16: qty 90, 30d supply, fill #1

## 2024-02-18 ENCOUNTER — Other Ambulatory Visit (HOSPITAL_BASED_OUTPATIENT_CLINIC_OR_DEPARTMENT_OTHER): Payer: Self-pay

## 2024-02-18 MED ORDER — SILDENAFIL CITRATE 50 MG PO TABS
50.0000 mg | ORAL_TABLET | Freq: Every day | ORAL | 0 refills | Status: DC | PRN
  Filled 2024-02-18: qty 10, 10d supply, fill #0

## 2024-02-18 NOTE — Telephone Encounter (Signed)
Rx refill sent to pharamcy. 

## 2024-02-19 ENCOUNTER — Other Ambulatory Visit (HOSPITAL_BASED_OUTPATIENT_CLINIC_OR_DEPARTMENT_OTHER): Payer: Self-pay

## 2024-02-20 ENCOUNTER — Other Ambulatory Visit (HOSPITAL_BASED_OUTPATIENT_CLINIC_OR_DEPARTMENT_OTHER): Payer: Self-pay

## 2024-02-21 ENCOUNTER — Other Ambulatory Visit (HOSPITAL_BASED_OUTPATIENT_CLINIC_OR_DEPARTMENT_OTHER): Payer: Self-pay

## 2024-02-24 ENCOUNTER — Other Ambulatory Visit: Payer: Self-pay

## 2024-02-24 ENCOUNTER — Other Ambulatory Visit (HOSPITAL_BASED_OUTPATIENT_CLINIC_OR_DEPARTMENT_OTHER): Payer: Self-pay

## 2024-02-25 ENCOUNTER — Other Ambulatory Visit (HOSPITAL_BASED_OUTPATIENT_CLINIC_OR_DEPARTMENT_OTHER): Payer: Self-pay

## 2024-02-26 ENCOUNTER — Other Ambulatory Visit (HOSPITAL_COMMUNITY): Payer: Self-pay

## 2024-02-27 ENCOUNTER — Other Ambulatory Visit (HOSPITAL_BASED_OUTPATIENT_CLINIC_OR_DEPARTMENT_OTHER): Payer: Self-pay

## 2024-02-28 ENCOUNTER — Other Ambulatory Visit (HOSPITAL_BASED_OUTPATIENT_CLINIC_OR_DEPARTMENT_OTHER): Payer: Self-pay

## 2024-03-02 ENCOUNTER — Other Ambulatory Visit (HOSPITAL_BASED_OUTPATIENT_CLINIC_OR_DEPARTMENT_OTHER): Payer: Self-pay

## 2024-03-03 ENCOUNTER — Other Ambulatory Visit (HOSPITAL_BASED_OUTPATIENT_CLINIC_OR_DEPARTMENT_OTHER): Payer: Self-pay

## 2024-03-04 ENCOUNTER — Other Ambulatory Visit (HOSPITAL_BASED_OUTPATIENT_CLINIC_OR_DEPARTMENT_OTHER): Payer: Self-pay

## 2024-03-04 ENCOUNTER — Other Ambulatory Visit: Payer: Self-pay

## 2024-03-04 MED ORDER — CLINDAMYCIN HCL 150 MG PO CAPS
300.0000 mg | ORAL_CAPSULE | ORAL | 0 refills | Status: AC
Start: 2024-03-04 — End: ?
  Filled 2024-03-04: qty 80, 10d supply, fill #0

## 2024-03-05 ENCOUNTER — Other Ambulatory Visit (HOSPITAL_BASED_OUTPATIENT_CLINIC_OR_DEPARTMENT_OTHER): Payer: Self-pay

## 2024-03-06 ENCOUNTER — Other Ambulatory Visit (HOSPITAL_BASED_OUTPATIENT_CLINIC_OR_DEPARTMENT_OTHER): Payer: Self-pay

## 2024-03-09 ENCOUNTER — Other Ambulatory Visit (HOSPITAL_BASED_OUTPATIENT_CLINIC_OR_DEPARTMENT_OTHER): Payer: Self-pay

## 2024-03-10 ENCOUNTER — Other Ambulatory Visit (HOSPITAL_BASED_OUTPATIENT_CLINIC_OR_DEPARTMENT_OTHER): Payer: Self-pay

## 2024-03-11 ENCOUNTER — Other Ambulatory Visit (HOSPITAL_BASED_OUTPATIENT_CLINIC_OR_DEPARTMENT_OTHER): Payer: Self-pay

## 2024-03-12 ENCOUNTER — Other Ambulatory Visit (HOSPITAL_BASED_OUTPATIENT_CLINIC_OR_DEPARTMENT_OTHER): Payer: Self-pay

## 2024-03-13 ENCOUNTER — Other Ambulatory Visit (HOSPITAL_BASED_OUTPATIENT_CLINIC_OR_DEPARTMENT_OTHER): Payer: Self-pay

## 2024-03-13 ENCOUNTER — Telehealth: Admitting: Physician Assistant

## 2024-03-13 ENCOUNTER — Encounter

## 2024-03-13 DIAGNOSIS — L282 Other prurigo: Secondary | ICD-10-CM | POA: Diagnosis not present

## 2024-03-13 MED ORDER — PREDNISONE 20 MG PO TABS
40.0000 mg | ORAL_TABLET | Freq: Every day | ORAL | 0 refills | Status: DC
  Filled 2024-03-13: qty 10, 5d supply, fill #0

## 2024-03-13 NOTE — Progress Notes (Signed)
 E Visit for Rash  We are sorry that you are not feeling well. Here is how we plan to help!  Based on what you have shared with me you are having a pruritic rash, possibly from an allergic reaction.  I recommend you take Benadryl 25 mg - 50 mg every 4 hours to control the symptoms but if they last over 24 hours it is best that you see an office based provider for follow up.  You can also take a non-drowsy, 24-hr anti-histamine like Claritin, Zyrtec, or Allegra for the itching.  You can add Famotidine (Pepcid) once or twice daily for itching also. This medication is a histamine-2 receptor blocker.   I have prescribed Prednisone 20mg  Take 2 tablets daily for 5 days. Monitor glucose closely.    HOME CARE:  Take cool showers and avoid direct sunlight. Apply cool compress or wet dressings. Take a bath in an oatmeal bath.  Sprinkle content of one Aveeno packet under running faucet with comfortably warm water.  Bathe for 15-20 minutes, 1-2 times daily.  Pat dry with a towel. Do not rub the rash. Use hydrocortisone cream. Take an antihistamine like Benadryl for widespread rashes that itch.  The adult dose of Benadryl is 25-50 mg by mouth 4 times daily. Caution:  This type of medication may cause sleepiness.  Do not drink alcohol, drive, or operate dangerous machinery while taking antihistamines.  Do not take these medications if you have prostate enlargement.  Read package instructions thoroughly on all medications that you take.  GET HELP RIGHT AWAY IF:  Symptoms don't go away after treatment. Severe itching that persists. If you rash spreads or swells. If you rash begins to smell. If it blisters and opens or develops a yellow-brown crust. You develop a fever. You have a sore throat. You become short of breath.  MAKE SURE YOU:  Understand these instructions. Will watch your condition. Will get help right away if you are not doing well or get worse.  Thank you for choosing an  e-visit.  Your e-visit answers were reviewed by a board certified advanced clinical practitioner to complete your personal care plan. Depending upon the condition, your plan could have included both over the counter or prescription medications.  Please review your pharmacy choice. Make sure the pharmacy is open so you can pick up prescription now. If there is a problem, you may contact your provider through [employer] and have the prescription routed to another pharmacy.  Your safety is important to us . If you have drug allergies check your prescription carefully.   For the next 24 hours you can use MyChart to ask questions about today's visit, request a non-urgent call back, or ask for a work or school excuse. You will get an email in the next two days asking about your experience. I hope that your e-visit has been valuable and will speed your recovery.   I have spent 5 minutes in review of e-visit questionnaire, review and updating patient chart, medical decision making and response to patient.   Delon CHRISTELLA Dickinson, PA-C

## 2024-03-16 ENCOUNTER — Other Ambulatory Visit (HOSPITAL_BASED_OUTPATIENT_CLINIC_OR_DEPARTMENT_OTHER): Payer: Self-pay

## 2024-03-17 ENCOUNTER — Other Ambulatory Visit (HOSPITAL_BASED_OUTPATIENT_CLINIC_OR_DEPARTMENT_OTHER): Payer: Self-pay

## 2024-03-17 ENCOUNTER — Other Ambulatory Visit: Payer: Self-pay

## 2024-03-17 ENCOUNTER — Other Ambulatory Visit: Payer: Self-pay | Admitting: Medical

## 2024-03-17 ENCOUNTER — Other Ambulatory Visit: Payer: Self-pay | Admitting: Orthopedic Surgery

## 2024-03-17 DIAGNOSIS — N528 Other male erectile dysfunction: Secondary | ICD-10-CM

## 2024-03-17 MED ORDER — BUSPIRONE HCL 7.5 MG PO TABS
7.5000 mg | ORAL_TABLET | Freq: Two times a day (BID) | ORAL | 0 refills | Status: DC
  Filled 2024-03-17: qty 60, 30d supply, fill #0

## 2024-03-17 MED ORDER — HYDROCHLOROTHIAZIDE 12.5 MG PO CAPS
12.5000 mg | ORAL_CAPSULE | Freq: Every day | ORAL | 1 refills | Status: DC
  Filled 2024-03-17 – 2024-04-27 (×2): qty 90, 90d supply, fill #0
  Filled 2024-06-08 – 2024-07-09 (×3): qty 90, 90d supply, fill #1

## 2024-03-17 MED ORDER — SILDENAFIL CITRATE 20 MG PO TABS
40.0000 mg | ORAL_TABLET | Freq: Every day | ORAL | 1 refills | Status: DC | PRN
  Filled 2024-03-17: qty 50, 10d supply, fill #0
  Filled 2024-04-27: qty 50, 10d supply, fill #1

## 2024-03-18 ENCOUNTER — Other Ambulatory Visit (HOSPITAL_BASED_OUTPATIENT_CLINIC_OR_DEPARTMENT_OTHER): Payer: Self-pay

## 2024-03-18 MED ORDER — ALPRAZOLAM 0.5 MG PO TABS
0.5000 mg | ORAL_TABLET | Freq: Two times a day (BID) | ORAL | 1 refills | Status: DC | PRN
  Filled 2024-03-18: qty 20, 10d supply, fill #0
  Filled 2024-04-27: qty 20, 10d supply, fill #1

## 2024-03-19 ENCOUNTER — Other Ambulatory Visit (HOSPITAL_BASED_OUTPATIENT_CLINIC_OR_DEPARTMENT_OTHER): Payer: Self-pay

## 2024-03-20 ENCOUNTER — Other Ambulatory Visit (HOSPITAL_BASED_OUTPATIENT_CLINIC_OR_DEPARTMENT_OTHER): Payer: Self-pay

## 2024-03-21 ENCOUNTER — Other Ambulatory Visit (HOSPITAL_BASED_OUTPATIENT_CLINIC_OR_DEPARTMENT_OTHER): Payer: Self-pay

## 2024-03-23 ENCOUNTER — Other Ambulatory Visit (HOSPITAL_BASED_OUTPATIENT_CLINIC_OR_DEPARTMENT_OTHER): Payer: Self-pay

## 2024-03-24 ENCOUNTER — Other Ambulatory Visit (HOSPITAL_BASED_OUTPATIENT_CLINIC_OR_DEPARTMENT_OTHER): Payer: Self-pay

## 2024-03-25 ENCOUNTER — Other Ambulatory Visit (HOSPITAL_BASED_OUTPATIENT_CLINIC_OR_DEPARTMENT_OTHER): Payer: Self-pay

## 2024-03-26 ENCOUNTER — Other Ambulatory Visit (HOSPITAL_BASED_OUTPATIENT_CLINIC_OR_DEPARTMENT_OTHER): Payer: Self-pay

## 2024-04-06 ENCOUNTER — Encounter: Payer: Self-pay | Admitting: Pharmacist

## 2024-04-06 ENCOUNTER — Other Ambulatory Visit (HOSPITAL_BASED_OUTPATIENT_CLINIC_OR_DEPARTMENT_OTHER): Payer: Self-pay

## 2024-04-06 ENCOUNTER — Other Ambulatory Visit (HOSPITAL_COMMUNITY): Payer: Self-pay

## 2024-04-06 ENCOUNTER — Other Ambulatory Visit: Payer: Self-pay

## 2024-04-07 ENCOUNTER — Other Ambulatory Visit: Payer: Self-pay

## 2024-04-07 ENCOUNTER — Other Ambulatory Visit (HOSPITAL_BASED_OUTPATIENT_CLINIC_OR_DEPARTMENT_OTHER): Payer: Self-pay

## 2024-04-07 ENCOUNTER — Other Ambulatory Visit (HOSPITAL_COMMUNITY): Payer: Self-pay

## 2024-04-09 ENCOUNTER — Other Ambulatory Visit (HOSPITAL_COMMUNITY): Payer: Self-pay

## 2024-04-27 ENCOUNTER — Other Ambulatory Visit: Payer: Self-pay | Admitting: Medical

## 2024-04-27 ENCOUNTER — Other Ambulatory Visit: Payer: Self-pay

## 2024-04-27 ENCOUNTER — Other Ambulatory Visit: Payer: Self-pay | Admitting: Cardiovascular Disease

## 2024-04-27 ENCOUNTER — Other Ambulatory Visit (HOSPITAL_BASED_OUTPATIENT_CLINIC_OR_DEPARTMENT_OTHER): Payer: Self-pay

## 2024-04-27 MED ORDER — BUSPIRONE HCL 7.5 MG PO TABS
7.5000 mg | ORAL_TABLET | Freq: Two times a day (BID) | ORAL | 0 refills | Status: DC
  Filled 2024-04-27: qty 60, 30d supply, fill #0

## 2024-04-27 MED ORDER — METOPROLOL TARTRATE 25 MG PO TABS
25.0000 mg | ORAL_TABLET | Freq: Two times a day (BID) | ORAL | 0 refills | Status: DC
  Filled 2024-04-27: qty 60, 30d supply, fill #0

## 2024-04-27 MED ORDER — SILDENAFIL CITRATE 50 MG PO TABS
50.0000 mg | ORAL_TABLET | Freq: Every day | ORAL | 0 refills | Status: DC | PRN
  Filled 2024-04-27: qty 6, 30d supply, fill #0
  Filled 2024-05-25: qty 4, 30d supply, fill #1

## 2024-05-18 ENCOUNTER — Other Ambulatory Visit: Payer: Self-pay

## 2024-05-18 ENCOUNTER — Other Ambulatory Visit (HOSPITAL_BASED_OUTPATIENT_CLINIC_OR_DEPARTMENT_OTHER): Payer: Self-pay

## 2024-05-20 DIAGNOSIS — F4323 Adjustment disorder with mixed anxiety and depressed mood: Secondary | ICD-10-CM | POA: Diagnosis not present

## 2024-05-25 ENCOUNTER — Other Ambulatory Visit: Payer: Self-pay | Admitting: Family

## 2024-05-25 ENCOUNTER — Other Ambulatory Visit: Payer: Self-pay | Admitting: Family Medicine

## 2024-05-25 ENCOUNTER — Other Ambulatory Visit: Payer: Self-pay

## 2024-05-25 ENCOUNTER — Other Ambulatory Visit (HOSPITAL_COMMUNITY): Payer: Self-pay

## 2024-05-25 ENCOUNTER — Other Ambulatory Visit (HOSPITAL_BASED_OUTPATIENT_CLINIC_OR_DEPARTMENT_OTHER): Payer: Self-pay

## 2024-05-25 ENCOUNTER — Other Ambulatory Visit: Payer: Self-pay | Admitting: Cardiovascular Disease

## 2024-05-25 ENCOUNTER — Other Ambulatory Visit: Payer: Self-pay | Admitting: Medical

## 2024-05-25 DIAGNOSIS — N528 Other male erectile dysfunction: Secondary | ICD-10-CM

## 2024-05-25 MED ORDER — GABAPENTIN 300 MG PO CAPS
300.0000 mg | ORAL_CAPSULE | Freq: Every day | ORAL | 3 refills | Status: AC
  Filled 2024-05-25 – 2024-06-08 (×2): qty 30, 30d supply, fill #0
  Filled 2024-06-21: qty 30, 10d supply, fill #0
  Filled 2024-06-23: qty 30, 30d supply, fill #0
  Filled 2024-07-09 – 2024-07-19 (×4): qty 30, 30d supply, fill #1
  Filled 2024-09-03 (×2): qty 30, 30d supply, fill #2

## 2024-05-25 MED ORDER — FLUTICASONE PROPIONATE 50 MCG/ACT NA SUSP
2.0000 | Freq: Every day | NASAL | 1 refills | Status: DC
  Filled 2024-05-25: qty 16, 30d supply, fill #0
  Filled 2024-06-21: qty 16, 30d supply, fill #1

## 2024-05-25 MED ORDER — LISINOPRIL 5 MG PO TABS
5.0000 mg | ORAL_TABLET | Freq: Every day | ORAL | 0 refills | Status: DC
  Filled 2024-05-25: qty 90, 90d supply, fill #0

## 2024-05-25 MED ORDER — ALPRAZOLAM 0.5 MG PO TABS
0.5000 mg | ORAL_TABLET | Freq: Two times a day (BID) | ORAL | 1 refills | Status: DC | PRN
  Filled 2024-05-25: qty 20, 10d supply, fill #0
  Filled 2024-06-08: qty 20, 10d supply, fill #1

## 2024-05-26 ENCOUNTER — Other Ambulatory Visit (HOSPITAL_COMMUNITY): Payer: Self-pay

## 2024-05-27 ENCOUNTER — Other Ambulatory Visit (HOSPITAL_BASED_OUTPATIENT_CLINIC_OR_DEPARTMENT_OTHER): Payer: Self-pay

## 2024-05-27 ENCOUNTER — Other Ambulatory Visit: Payer: Self-pay

## 2024-05-27 MED ORDER — METOPROLOL TARTRATE 25 MG PO TABS
25.0000 mg | ORAL_TABLET | Freq: Two times a day (BID) | ORAL | 0 refills | Status: DC
  Filled 2024-05-27: qty 30, 15d supply, fill #0

## 2024-05-28 ENCOUNTER — Other Ambulatory Visit (HOSPITAL_COMMUNITY): Payer: Self-pay

## 2024-06-01 ENCOUNTER — Encounter: Payer: Self-pay | Admitting: Radiology

## 2024-06-08 ENCOUNTER — Other Ambulatory Visit (HOSPITAL_BASED_OUTPATIENT_CLINIC_OR_DEPARTMENT_OTHER): Payer: Self-pay

## 2024-06-08 ENCOUNTER — Other Ambulatory Visit: Payer: Self-pay | Admitting: Cardiovascular Disease

## 2024-06-08 ENCOUNTER — Other Ambulatory Visit: Payer: Self-pay | Admitting: Medical

## 2024-06-08 ENCOUNTER — Other Ambulatory Visit (HOSPITAL_COMMUNITY): Payer: Self-pay

## 2024-06-08 ENCOUNTER — Other Ambulatory Visit: Payer: Self-pay

## 2024-06-08 MED ORDER — BUSPIRONE HCL 7.5 MG PO TABS
7.5000 mg | ORAL_TABLET | Freq: Two times a day (BID) | ORAL | 0 refills | Status: DC
  Filled 2024-06-08: qty 60, 30d supply, fill #0

## 2024-06-08 MED ORDER — SILDENAFIL CITRATE 50 MG PO TABS
50.0000 mg | ORAL_TABLET | Freq: Every day | ORAL | 0 refills | Status: DC | PRN
  Filled 2024-06-08 – 2024-06-23 (×3): qty 10, 10d supply, fill #0

## 2024-06-09 ENCOUNTER — Other Ambulatory Visit (HOSPITAL_COMMUNITY): Payer: Self-pay

## 2024-06-11 ENCOUNTER — Other Ambulatory Visit: Payer: Self-pay | Admitting: Cardiovascular Disease

## 2024-06-11 ENCOUNTER — Other Ambulatory Visit (HOSPITAL_BASED_OUTPATIENT_CLINIC_OR_DEPARTMENT_OTHER): Payer: Self-pay

## 2024-06-21 ENCOUNTER — Other Ambulatory Visit: Payer: Self-pay | Admitting: Orthopedic Surgery

## 2024-06-21 ENCOUNTER — Other Ambulatory Visit: Payer: Self-pay | Admitting: Cardiovascular Disease

## 2024-06-21 ENCOUNTER — Other Ambulatory Visit: Payer: Self-pay | Admitting: Medical

## 2024-06-22 ENCOUNTER — Other Ambulatory Visit (HOSPITAL_COMMUNITY): Payer: Self-pay

## 2024-06-22 ENCOUNTER — Encounter (HOSPITAL_BASED_OUTPATIENT_CLINIC_OR_DEPARTMENT_OTHER): Payer: Self-pay

## 2024-06-22 ENCOUNTER — Other Ambulatory Visit: Payer: Self-pay

## 2024-06-22 MED ORDER — BUSPIRONE HCL 7.5 MG PO TABS
7.5000 mg | ORAL_TABLET | Freq: Two times a day (BID) | ORAL | 0 refills | Status: DC
  Filled 2024-06-22 – 2024-07-09 (×2): qty 180, 90d supply, fill #0

## 2024-06-23 ENCOUNTER — Other Ambulatory Visit: Payer: Self-pay

## 2024-06-23 ENCOUNTER — Other Ambulatory Visit (HOSPITAL_BASED_OUTPATIENT_CLINIC_OR_DEPARTMENT_OTHER): Payer: Self-pay

## 2024-06-23 MED ORDER — ALPRAZOLAM 0.5 MG PO TABS
0.5000 mg | ORAL_TABLET | Freq: Two times a day (BID) | ORAL | 1 refills | Status: DC | PRN
  Filled 2024-06-23: qty 20, 10d supply, fill #0
  Filled 2024-07-09: qty 20, 10d supply, fill #1

## 2024-06-24 ENCOUNTER — Other Ambulatory Visit (HOSPITAL_BASED_OUTPATIENT_CLINIC_OR_DEPARTMENT_OTHER): Payer: Self-pay

## 2024-06-26 ENCOUNTER — Other Ambulatory Visit (HOSPITAL_BASED_OUTPATIENT_CLINIC_OR_DEPARTMENT_OTHER): Payer: Self-pay

## 2024-07-06 ENCOUNTER — Other Ambulatory Visit (HOSPITAL_BASED_OUTPATIENT_CLINIC_OR_DEPARTMENT_OTHER): Payer: Self-pay

## 2024-07-09 ENCOUNTER — Other Ambulatory Visit (HOSPITAL_BASED_OUTPATIENT_CLINIC_OR_DEPARTMENT_OTHER): Payer: Self-pay

## 2024-07-09 ENCOUNTER — Other Ambulatory Visit: Payer: Self-pay | Admitting: Cardiovascular Disease

## 2024-07-09 ENCOUNTER — Other Ambulatory Visit: Payer: Self-pay | Admitting: Medical

## 2024-07-09 ENCOUNTER — Other Ambulatory Visit: Payer: Self-pay

## 2024-07-09 MED ORDER — TESTOSTERONE 30 MG/ACT TD SOLN
30.0000 mg | Freq: Every day | TRANSDERMAL | 4 refills | Status: AC
  Filled 2024-07-09: qty 90, 30d supply, fill #0
  Filled 2024-07-19 – 2024-09-03 (×3): qty 90, 30d supply, fill #1

## 2024-07-09 MED ORDER — LISINOPRIL 5 MG PO TABS
5.0000 mg | ORAL_TABLET | Freq: Every day | ORAL | 0 refills | Status: AC
  Filled 2024-07-09 – 2024-09-03 (×6): qty 30, 30d supply, fill #0

## 2024-07-09 MED ORDER — SILDENAFIL CITRATE 50 MG PO TABS
50.0000 mg | ORAL_TABLET | Freq: Every day | ORAL | 0 refills | Status: DC | PRN
  Filled 2024-07-09: qty 10, 10d supply, fill #0

## 2024-07-10 ENCOUNTER — Other Ambulatory Visit (HOSPITAL_BASED_OUTPATIENT_CLINIC_OR_DEPARTMENT_OTHER): Payer: Self-pay

## 2024-07-10 MED ORDER — ATORVASTATIN CALCIUM 80 MG PO TABS
80.0000 mg | ORAL_TABLET | Freq: Every day | ORAL | 0 refills | Status: AC
  Filled 2024-07-10 – 2024-07-28 (×4): qty 90, 90d supply, fill #0

## 2024-07-13 ENCOUNTER — Other Ambulatory Visit: Payer: Self-pay | Admitting: Medical

## 2024-07-13 ENCOUNTER — Other Ambulatory Visit: Payer: Self-pay

## 2024-07-13 ENCOUNTER — Other Ambulatory Visit (HOSPITAL_COMMUNITY): Payer: Self-pay

## 2024-07-13 MED ORDER — FLUTICASONE PROPIONATE 50 MCG/ACT NA SUSP
2.0000 | Freq: Every day | NASAL | 1 refills | Status: AC
  Filled 2024-07-13 – 2024-07-28 (×2): qty 16, 30d supply, fill #0

## 2024-07-14 ENCOUNTER — Other Ambulatory Visit (HOSPITAL_BASED_OUTPATIENT_CLINIC_OR_DEPARTMENT_OTHER): Payer: Self-pay

## 2024-07-14 ENCOUNTER — Other Ambulatory Visit: Payer: Self-pay

## 2024-07-14 ENCOUNTER — Other Ambulatory Visit (HOSPITAL_COMMUNITY): Payer: Self-pay

## 2024-07-14 MED ORDER — METOPROLOL TARTRATE 25 MG PO TABS
25.0000 mg | ORAL_TABLET | Freq: Two times a day (BID) | ORAL | 0 refills | Status: AC
  Filled 2024-07-14: qty 30, 15d supply, fill #0

## 2024-07-16 ENCOUNTER — Ambulatory Visit: Payer: Self-pay | Admitting: Medical

## 2024-07-16 ENCOUNTER — Ambulatory Visit: Admitting: Medical

## 2024-07-16 ENCOUNTER — Other Ambulatory Visit (HOSPITAL_BASED_OUTPATIENT_CLINIC_OR_DEPARTMENT_OTHER): Payer: Self-pay

## 2024-07-16 VITALS — BP 130/86 | HR 78 | Temp 97.8°F | Resp 16 | Ht 74.0 in | Wt 253.2 lb

## 2024-07-16 DIAGNOSIS — Z23 Encounter for immunization: Secondary | ICD-10-CM

## 2024-07-16 DIAGNOSIS — Z1159 Encounter for screening for other viral diseases: Secondary | ICD-10-CM

## 2024-07-16 DIAGNOSIS — E785 Hyperlipidemia, unspecified: Secondary | ICD-10-CM | POA: Diagnosis not present

## 2024-07-16 DIAGNOSIS — N528 Other male erectile dysfunction: Secondary | ICD-10-CM

## 2024-07-16 DIAGNOSIS — Z7984 Long term (current) use of oral hypoglycemic drugs: Secondary | ICD-10-CM

## 2024-07-16 DIAGNOSIS — E1165 Type 2 diabetes mellitus with hyperglycemia: Secondary | ICD-10-CM

## 2024-07-16 DIAGNOSIS — E1159 Type 2 diabetes mellitus with other circulatory complications: Secondary | ICD-10-CM | POA: Diagnosis not present

## 2024-07-16 LAB — LIPID PANEL
Cholesterol: 103 mg/dL (ref 28–200)
HDL: 31.2 mg/dL — ABNORMAL LOW (ref >39.00)
LDL Cholesterol: 43 mg/dL (ref 10–99)
NonHDL: 71.75
Total CHOL/HDL Ratio: 3
Triglycerides: 144 mg/dL (ref 10.0–149.0)
VLDL: 28.8 mg/dL (ref 0.0–40.0)

## 2024-07-16 LAB — COMPREHENSIVE METABOLIC PANEL WITH GFR
ALT: 23 U/L (ref 3–53)
AST: 16 U/L (ref 5–37)
Albumin: 4.4 g/dL (ref 3.5–5.2)
Alkaline Phosphatase: 81 U/L (ref 39–117)
BUN: 16 mg/dL (ref 6–23)
CO2: 30 meq/L (ref 19–32)
Calcium: 9.1 mg/dL (ref 8.4–10.5)
Chloride: 100 meq/L (ref 96–112)
Creatinine, Ser: 0.99 mg/dL (ref 0.40–1.50)
GFR: 81.96 mL/min (ref >60.00)
Glucose, Bld: 191 mg/dL — ABNORMAL HIGH (ref 70–99)
Potassium: 3.9 meq/L (ref 3.5–5.1)
Sodium: 139 meq/L (ref 135–145)
Total Bilirubin: 0.8 mg/dL (ref 0.2–1.2)
Total Protein: 6.8 g/dL (ref 6.0–8.3)

## 2024-07-16 LAB — MICROALBUMIN / CREATININE URINE RATIO
Creatinine,U: 167.9 mg/dL
Microalb Creat Ratio: 6.2 mg/g (ref 0.0–30.0)
Microalb, Ur: 1 mg/dL (ref 0.7–1.9)

## 2024-07-16 MED ORDER — SILDENAFIL CITRATE 20 MG PO TABS
ORAL_TABLET | ORAL | 11 refills | Status: AC
  Filled 2024-07-16 (×2): qty 50, 10d supply, fill #0
  Filled 2024-07-19 – 2024-07-28 (×2): qty 50, 10d supply, fill #1

## 2024-07-16 NOTE — Progress Notes (Signed)
° °  Subjective:    Patient ID: Joshua Branch, male    DOB: [birth year], 63 y.o.   MRN: [id number]  HPI   Last AVS below in    Intermittent Vertigo Intermittent vertigo for two weeks, associated with nausea. Episodes less severe than past occurrences. Possible link to stopping Zyrtec  and eustachian tube dysfunction. Epley maneuvers previously ineffective. Advised monitoring blood sugar during episodes. CT of the head without contrast considered if symptoms persist or worsen. Emergency care advised for motor or sensory deficits. - Continue meclizine  as needed. - Restart Flonase , two sprays each nostril daily. - Monitor blood sugar during prolonged episodes. - Consider CT of the head without contrast if symptoms persist or worsen. - Seek emergency care if motor or sensory deficits occur or severe ha with vertigo.   Allergic Rhinitis Seasonal allergic rhinitis managed with Zyrtec . Ear plugging noted after stopping Zyrtec , possibly contributing to vertigo. Eustachian tube dysfunction discussed. - Restart Flonase , two sprays each nostril daily.   Family History of Dementia Concern about family history of dementia. Discussed hereditary aspects and preventive measures. No current cognitive decline symptoms. - Maintain healthy lifestyle with regular exercise and social interaction. - Monitor for cognitive decline signs.   No report of any reoccruring vertigo.  Hx of ED. He requests sildenafil  medication management since his prior cialis  prescription is no longer available through his curren insurance  provider. He has used sildenafil   previously for years, prefers 20 mg tablets, and takes 2 to 5 tablets as needed. He is not taking nitroglycerin .  He manages diabetes with metformin , with a last A1c of 7.6. Current medications are lisinopril  5 mg, HCTZ 12.5 mg, and metformin  780 mg. His blood pressure today is 130/86.  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 He has diabetes, prior heart attack, and a leg amputation.      Review of Systems See hpi    Objective:   Physical Exam  General Mental Status- Alert. General Appearance- Not in acute distress.     Chest and Lung Exam Auscultation: Breath Sounds:-CTA  Cardiovascular Auscultation:Rythm- RRR Murmurs & Other Heart Sounds:Auscultation of the heart reveals- No Murmurs.   Neurologic Cranial Nerve exam:- CN III-XII intact(No nystagmus), symmetric smile. Strength:- 5/5 equal and symmetric strength both upper and lower extremities.       Assessment & Plan:   Male erectile dysfunction Requires sildenafil  20 mg due to medication availability changes. Prefers generic sildenafil . Aware of insurance limitations. - Prescribed sildenafil  20 mg, 50 tablets, take 2-5 tablets as needed.  Type 2 diabetes mellitus with circulatory complications Managed with metformin . Last A1c 7.6% indicates suboptimal control. Due for A1c test. Discussed increased infection risk and importance of glycemic control. - Ordered A1c test. - Continue metformin . -urine microalbumin today. - Follow up with endocrinologist for diabetes management.  Hyperlipidemia Managed with current medications. Last lipid panel over a year ago. Discussed importance of monitoring lipid levels. - Ordered lipid panel. - Ordered metabolic panel to monitor liver enzymes.  Discussed std exposure/girlfriend hsv 2 + antibody history. -std screening declined.  Follow up 6 months or sooner if needed  [employer], PA-C

## 2024-07-16 NOTE — Patient Instructions (Addendum)
 Male erectile dysfunction Requires sildenafil  20 mg due to medication availability changes. Prefers generic sildenafil . Aware of insurance limitations. - Prescribed sildenafil  20 mg, 50 tablets, take 2-5 tablets as needed.  Type 2 diabetes mellitus with circulatory complications Managed with metformin . Last A1c 7.6% indicates suboptimal control. Due for A1c test. Discussed increased infection risk and importance of glycemic control. - Ordered A1c test. - Continue metformin . -urine microalbumin today. - Follow up with endocrinologist for diabetes management.  Hyperlipidemia Managed with current medications. Last lipid panel over a year ago. Discussed importance of monitoring lipid levels. - Ordered lipid panel. - Ordered metabolic panel to monitor liver enzymes.  Discussed std exposure/girlfriend hsv 2 + antibody history. -std screening declined. -discussed pt risk of infection and theoretical complications.   Pt flu vaccine today. Decline pneumonia pcv 20 today but can get later.  Follow up 6 months or sooner if needed

## 2024-07-20 ENCOUNTER — Other Ambulatory Visit (HOSPITAL_BASED_OUTPATIENT_CLINIC_OR_DEPARTMENT_OTHER): Payer: Self-pay

## 2024-07-20 ENCOUNTER — Other Ambulatory Visit: Payer: Self-pay

## 2024-07-21 ENCOUNTER — Other Ambulatory Visit: Payer: Self-pay | Admitting: Family

## 2024-07-21 ENCOUNTER — Other Ambulatory Visit: Payer: Self-pay | Admitting: Cardiovascular Disease

## 2024-07-21 ENCOUNTER — Other Ambulatory Visit: Payer: Self-pay | Admitting: Medical

## 2024-07-21 ENCOUNTER — Other Ambulatory Visit (HOSPITAL_BASED_OUTPATIENT_CLINIC_OR_DEPARTMENT_OTHER): Payer: Self-pay

## 2024-07-21 MED ORDER — BUSPIRONE HCL 7.5 MG PO TABS
7.5000 mg | ORAL_TABLET | Freq: Two times a day (BID) | ORAL | 1 refills | Status: AC
  Filled 2024-07-28: qty 180, 90d supply, fill #0

## 2024-07-21 MED ORDER — HYDROCHLOROTHIAZIDE 12.5 MG PO CAPS
12.5000 mg | ORAL_CAPSULE | Freq: Every day | ORAL | 1 refills | Status: AC
  Filled 2024-07-28: qty 90, 90d supply, fill #0

## 2024-07-21 NOTE — Addendum Note (Signed)
 Addended by: DORINA DALLAS HERO on: 07/21/2024 09:50 PM   Modules accepted: Orders

## 2024-07-24 ENCOUNTER — Other Ambulatory Visit (HOSPITAL_BASED_OUTPATIENT_CLINIC_OR_DEPARTMENT_OTHER): Payer: Self-pay

## 2024-07-24 ENCOUNTER — Encounter (HOSPITAL_BASED_OUTPATIENT_CLINIC_OR_DEPARTMENT_OTHER): Payer: Self-pay

## 2024-07-25 MED ORDER — SILDENAFIL CITRATE 50 MG PO TABS
50.0000 mg | ORAL_TABLET | Freq: Every day | ORAL | 0 refills | Status: AC | PRN
  Filled 2024-07-25: qty 10, 10d supply, fill #0

## 2024-07-25 NOTE — Telephone Encounter (Signed)
 Rx viagra  sent to pharmacy. Recenty rx of revatio  at pt request to see if more reasonably priced.

## 2024-07-27 ENCOUNTER — Other Ambulatory Visit (HOSPITAL_BASED_OUTPATIENT_CLINIC_OR_DEPARTMENT_OTHER): Payer: Self-pay

## 2024-07-27 ENCOUNTER — Other Ambulatory Visit: Payer: Self-pay

## 2024-07-28 ENCOUNTER — Other Ambulatory Visit: Payer: Self-pay

## 2024-07-28 ENCOUNTER — Other Ambulatory Visit (HOSPITAL_BASED_OUTPATIENT_CLINIC_OR_DEPARTMENT_OTHER): Payer: Self-pay

## 2024-07-28 MED ORDER — ALPRAZOLAM 0.5 MG PO TABS
0.5000 mg | ORAL_TABLET | Freq: Two times a day (BID) | ORAL | 1 refills | Status: AC | PRN
  Filled 2024-07-28: qty 20, 10d supply, fill #0

## 2024-08-11 ENCOUNTER — Other Ambulatory Visit (HOSPITAL_BASED_OUTPATIENT_CLINIC_OR_DEPARTMENT_OTHER): Payer: Self-pay

## 2024-09-03 ENCOUNTER — Other Ambulatory Visit: Payer: Self-pay

## 2024-09-03 ENCOUNTER — Other Ambulatory Visit (HOSPITAL_COMMUNITY): Payer: Self-pay

## 2024-09-03 ENCOUNTER — Other Ambulatory Visit: Payer: Self-pay | Admitting: Cardiovascular Disease

## 2024-09-03 ENCOUNTER — Other Ambulatory Visit (HOSPITAL_BASED_OUTPATIENT_CLINIC_OR_DEPARTMENT_OTHER): Payer: Self-pay

## 2024-09-04 ENCOUNTER — Other Ambulatory Visit (HOSPITAL_BASED_OUTPATIENT_CLINIC_OR_DEPARTMENT_OTHER): Payer: Self-pay
